# Patient Record
Sex: Female | Born: 1955 | Race: White | Hispanic: No | Smoking: Current some day smoker
Health system: Southern US, Community
[De-identification: ages and names within clinical notes are randomized; demographics above are authoritative.]

## PROBLEM LIST (undated history)

## (undated) DIAGNOSIS — Z5189 Encounter for other specified aftercare: Secondary | ICD-10-CM

## (undated) DIAGNOSIS — R011 Cardiac murmur, unspecified: Secondary | ICD-10-CM

## (undated) DIAGNOSIS — J449 Chronic obstructive pulmonary disease, unspecified: Secondary | ICD-10-CM

## (undated) DIAGNOSIS — N289 Disorder of kidney and ureter, unspecified: Secondary | ICD-10-CM

## (undated) DIAGNOSIS — C801 Malignant (primary) neoplasm, unspecified: Secondary | ICD-10-CM

## (undated) DIAGNOSIS — M1990 Unspecified osteoarthritis, unspecified site: Secondary | ICD-10-CM

## (undated) DIAGNOSIS — I509 Heart failure, unspecified: Secondary | ICD-10-CM

## (undated) DIAGNOSIS — D849 Immunodeficiency, unspecified: Secondary | ICD-10-CM

## (undated) DIAGNOSIS — J45909 Unspecified asthma, uncomplicated: Secondary | ICD-10-CM

## (undated) HISTORY — DX: Cardiac murmur, unspecified: R01.1

## (undated) HISTORY — PX: BLADDER REMOVAL: SHX567

---

## 2018-02-17 HISTORY — PX: FRACTURE SURGERY: SHX138

## 2018-12-25 ENCOUNTER — Ambulatory Visit: Payer: Self-pay

## 2019-02-26 ENCOUNTER — Other Ambulatory Visit: Payer: Self-pay | Admitting: Urology

## 2019-03-04 ENCOUNTER — Other Ambulatory Visit (HOSPITAL_COMMUNITY): Payer: Self-pay | Admitting: Urology

## 2019-03-04 DIAGNOSIS — N1339 Other hydronephrosis: Secondary | ICD-10-CM

## 2019-03-07 ENCOUNTER — Other Ambulatory Visit: Payer: Self-pay

## 2019-03-07 ENCOUNTER — Emergency Department (HOSPITAL_COMMUNITY)
Admission: EM | Admit: 2019-03-07 | Discharge: 2019-03-07 | Disposition: A | Discharge status: Home / Self Care | Payer: Medicare HMO | Attending: Emergency Medicine | Admitting: Emergency Medicine

## 2019-03-07 ENCOUNTER — Encounter (HOSPITAL_COMMUNITY): Payer: Self-pay

## 2019-03-07 ENCOUNTER — Telehealth: Payer: Self-pay | Admitting: Oncology

## 2019-03-07 ENCOUNTER — Emergency Department (HOSPITAL_COMMUNITY): Payer: Medicare HMO

## 2019-03-07 DIAGNOSIS — Y999 Unspecified external cause status: Secondary | ICD-10-CM | POA: Insufficient documentation

## 2019-03-07 DIAGNOSIS — Y92019 Unspecified place in single-family (private) house as the place of occurrence of the external cause: Secondary | ICD-10-CM | POA: Diagnosis not present

## 2019-03-07 DIAGNOSIS — R600 Localized edema: Secondary | ICD-10-CM | POA: Diagnosis not present

## 2019-03-07 DIAGNOSIS — J449 Chronic obstructive pulmonary disease, unspecified: Secondary | ICD-10-CM | POA: Diagnosis not present

## 2019-03-07 DIAGNOSIS — W1830XA Fall on same level, unspecified, initial encounter: Secondary | ICD-10-CM | POA: Insufficient documentation

## 2019-03-07 DIAGNOSIS — W19XXXA Unspecified fall, initial encounter: Secondary | ICD-10-CM

## 2019-03-07 DIAGNOSIS — Y9389 Activity, other specified: Secondary | ICD-10-CM | POA: Insufficient documentation

## 2019-03-07 DIAGNOSIS — S0003XA Contusion of scalp, initial encounter: Secondary | ICD-10-CM | POA: Insufficient documentation

## 2019-03-07 DIAGNOSIS — Z87891 Personal history of nicotine dependence: Secondary | ICD-10-CM | POA: Diagnosis not present

## 2019-03-07 DIAGNOSIS — S0990XA Unspecified injury of head, initial encounter: Secondary | ICD-10-CM | POA: Diagnosis not present

## 2019-03-07 DIAGNOSIS — N189 Chronic kidney disease, unspecified: Secondary | ICD-10-CM | POA: Insufficient documentation

## 2019-03-07 HISTORY — DX: Unspecified asthma, uncomplicated: J45.909

## 2019-03-07 HISTORY — DX: Malignant (primary) neoplasm, unspecified: C80.1

## 2019-03-07 HISTORY — DX: Heart failure, unspecified: I50.9

## 2019-03-07 HISTORY — DX: Unspecified osteoarthritis, unspecified site: M19.90

## 2019-03-07 HISTORY — DX: Immunodeficiency, unspecified: D84.9

## 2019-03-07 HISTORY — DX: Chronic obstructive pulmonary disease, unspecified: J44.9

## 2019-03-07 HISTORY — DX: Encounter for other specified aftercare: Z51.89

## 2019-03-07 HISTORY — DX: Disorder of kidney and ureter, unspecified: N28.9

## 2019-03-07 LAB — URINALYSIS, ROUTINE W REFLEX MICROSCOPIC
Bilirubin Urine: NEGATIVE
Glucose, UA: NEGATIVE mg/dL
Ketones, ur: NEGATIVE mg/dL
Nitrite: NEGATIVE
Protein, ur: 30 mg/dL — AB
Specific Gravity, Urine: 1.008 (ref 1.005–1.030)
pH: 6 (ref 5.0–8.0)

## 2019-03-07 LAB — CBC
HCT: 33.9 % — ABNORMAL LOW (ref 36.0–46.0)
Hemoglobin: 11 g/dL — ABNORMAL LOW (ref 12.0–15.0)
MCH: 29 pg (ref 26.0–34.0)
MCHC: 32.4 g/dL (ref 30.0–36.0)
MCV: 89.4 fL (ref 80.0–100.0)
Platelets: 413 10*3/uL — ABNORMAL HIGH (ref 150–400)
RBC: 3.79 MIL/uL — ABNORMAL LOW (ref 3.87–5.11)
RDW: 13.8 % (ref 11.5–15.5)
WBC: 6.5 10*3/uL (ref 4.0–10.5)
nRBC: 0 % (ref 0.0–0.2)

## 2019-03-07 LAB — BASIC METABOLIC PANEL
Anion gap: 13 (ref 5–15)
BUN: 20 mg/dL (ref 8–23)
CO2: 20 mmol/L — ABNORMAL LOW (ref 22–32)
Calcium: 9.1 mg/dL (ref 8.9–10.3)
Chloride: 102 mmol/L (ref 98–111)
Creatinine, Ser: 2.5 mg/dL — ABNORMAL HIGH (ref 0.44–1.00)
GFR calc Af Amer: 23 mL/min — ABNORMAL LOW (ref >60)
GFR calc non Af Amer: 20 mL/min — ABNORMAL LOW (ref >60)
Glucose, Bld: 112 mg/dL — ABNORMAL HIGH (ref 70–99)
Potassium: 3.6 mmol/L (ref 3.5–5.1)
Sodium: 135 mmol/L (ref 135–145)

## 2019-03-07 MED ORDER — SODIUM CHLORIDE 0.9% FLUSH: 3.0000 mL | Freq: Once | INTRAVENOUS | Status: DC

## 2019-03-07 NOTE — ED Provider Notes (Signed)
[facility] EMERGENCY DEPARTMENT Provider Note   CSN: [id number] Arrival date & time: 03/07/19  1621     History Chief Complaint  Patient presents with   Fall   Dizziness    Shelia Chan is a 64 y.o. female.  HPI Patient describes fall, and injuring the back of her head, after she stumbled on her slippers, at home today.  She states she has trouble walking because of swelling of her legs which is chronic and ongoing.  She feels the swelling is related to her chronic kidney disease.  She plans on seeing a nephrologist soon, and is scheduled to have "stents replaced," by her urologist next week.  She denies headache, neck pain, chest pain, back pain, injuries to her extremities.  There was no prodrome to the incident.  She denies recent acute illnesses.  She states she is taking her medications as directed.  There are no other known modifying factors.    Past Medical History:  Diagnosis Date   Arthritis    Asthma    Blood transfusion without reported diagnosis    Cancer ([place])    CHF (congestive heart failure) ([place])    COPD (chronic obstructive pulmonary disease) ([place])    Immune deficiency disorder ([facility])    Renal disorder     There are no problems to display for this patient.   Past Surgical History:  Procedure Laterality Date   BLADDER REMOVAL  118/22/2015   FRACTURE SURGERY Left 02/2018   pins     OB History   No obstetric history on file.     History reviewed. No pertinent family history.  Social History   Tobacco Use   Smoking status: Former Smoker    Packs/day: 1.00    Years: 50.00    Pack years: 50.00    Types: Cigarettes    Quit date: 01/21/2019    Years since quitting: 0.1   Smokeless tobacco: Never Used  Substance Use Topics   Alcohol use: Not Currently   Drug use: Yes    Types: Marijuana    Comment: occasional     Home Medications Prior to Admission medications   Not on File    Allergies    Patient has  no allergy information on record.  Review of Systems   Review of Systems  All other systems reviewed and are negative.   Physical Exam Updated Vital Signs BP 136/72    Pulse (!) 55    Temp 98.3 F (36.8 C) (Oral)    Resp 18    Ht 5' 1.5" (1.562 m)    Wt 59 kg    SpO2 99%    BMI 24.17 kg/m   Physical Exam Vitals and nursing note reviewed.  Constitutional:      General: She is not in acute distress.    Appearance: She is well-developed. She is not ill-appearing, toxic-appearing or diaphoretic.  HENT:     Head: Normocephalic.     Comments: Small contusion, left parietal.  No bleeding or laceration.    Mouth/Throat:     Mouth: Mucous membranes are moist.     Pharynx: No oropharyngeal exudate or posterior oropharyngeal erythema.  Eyes:     Conjunctiva/sclera: Conjunctivae normal.     Pupils: Pupils are equal, round, and reactive to light.  Neck:     Trachea: Phonation normal.  Cardiovascular:     Rate and Rhythm: Normal rate and regular rhythm.  Pulmonary:     Effort: Pulmonary effort is normal.  Breath sounds: Normal breath sounds.  Chest:     Chest wall: No tenderness.  Abdominal:     General: There is no distension.     Palpations: Abdomen is soft.     Tenderness: There is no abdominal tenderness. There is no guarding.  Genitourinary:    Comments: Urostomy, right lower quadrant, urine appears normal in the bag.  Device appears normal. Musculoskeletal:        General: Normal range of motion.     Cervical back: Normal range of motion and neck supple.     Right lower leg: Edema present.     Left lower leg: Edema present.  Skin:    General: Skin is warm and dry.  Neurological:     Mental Status: She is alert and oriented to person, place, and time.     Motor: No abnormal muscle tone.  Psychiatric:        Mood and Affect: Mood normal.        Behavior: Behavior normal.        Thought Content: Thought content normal.        Judgment: Judgment normal.     ED  Results / Procedures / Treatments   Labs (all labs ordered are listed, but only abnormal results are displayed) Labs Reviewed  BASIC METABOLIC PANEL - Abnormal; Notable for the following components:      Result Value   CO2 20 (*)    Glucose, Bld 112 (*)    Creatinine, Ser 2.50 (*)    GFR calc non Af Amer 20 (*)    GFR calc Af Amer 23 (*)    All other components within normal limits  CBC - Abnormal; Notable for the following components:   RBC 3.79 (*)    Hemoglobin 11.0 (*)    HCT 33.9 (*)    Platelets 413 (*)    All other components within normal limits  URINALYSIS, ROUTINE W REFLEX MICROSCOPIC - Abnormal; Notable for the following components:   APPearance CLOUDY (*)    Hgb urine dipstick MODERATE (*)    Protein, ur 30 (*)    Leukocytes,Ua LARGE (*)    Bacteria, UA FEW (*)    All other components within normal limits    EKG EKG Interpretation  Date/Time:  Thursday March 07 2019 16:33:29 EST Ventricular Rate:  60 PR Interval:  170 QRS Duration: 76 QT Interval:  432 QTC Calculation: 432 R Axis:   46 Text Interpretation: Normal sinus rhythm Nonspecific ST abnormality Abnormal ECG No previous ECGs available Confirmed by Daleen Bo [phone]) on 03/07/2019 6:05:31 PM   Radiology CT Head Wo Contrast  Result Date: 03/07/2019 CLINICAL DATA:  The patient fell and hit her head. Dizziness following the fall. EXAM: CT HEAD WITHOUT CONTRAST CT CERVICAL SPINE WITHOUT CONTRAST TECHNIQUE: Multidetector CT imaging of the head and cervical spine was performed following the standard protocol without intravenous contrast. Multiplanar CT image reconstructions of the cervical spine were also generated. COMPARISON:  None. FINDINGS: CT HEAD FINDINGS Brain: Normal appearing cerebral hemispheres and posterior fossa structures. Normal size and position of the ventricles. No intracranial hemorrhage, mass lesion or CT evidence of acute infarction. Vascular: No hyperdense vessel or unexpected  calcification. Skull: Normal. Negative for fracture or focal lesion. Sinuses/Orbits: Unremarkable. Other: None. CT CERVICAL SPINE FINDINGS Alignment: Mild retrolisthesis at the C5-6 level and minimal retrolisthesis at the C6-7 level. Skull base and vertebrae: No acute fracture. No primary bone lesion or focal pathologic process. Soft tissues and  spinal canal: No prevertebral fluid or swelling. No visible canal hematoma. Disc levels: Marked disc space narrowing with discogenic sclerosis, Schmorl's node formation mild anterior posterior spur formation at the C5-6 level. Similar changes to a slightly lesser degree at the C6-7 level. Mild facet degenerative changes at multiple levels. Upper chest: Mild bilateral upper lobe bullous changes. Other: 4 mm left lobe thyroid nodule and similar sized right lobe thyroid nodule. IMPRESSION: 1. No skull fracture or intracranial hemorrhage. 2. No cervical spine fracture or traumatic subluxation. 3. Multilevel cervical spine degenerative changes, as described above. 4. Mild changes of COPD. 5. Bilateral 4 mm thyroid nodules. No followup recommended (ref: J Am Coll Radiol. 2015 Feb;12(2): 143-50). Electronically Signed   By: Claudie Revering M.D.   On: 03/07/2019 19:07   CT Cervical Spine Wo Contrast  Result Date: 03/07/2019 CLINICAL DATA:  The patient fell and hit her head. Dizziness following the fall. EXAM: CT HEAD WITHOUT CONTRAST CT CERVICAL SPINE WITHOUT CONTRAST TECHNIQUE: Multidetector CT imaging of the head and cervical spine was performed following the standard protocol without intravenous contrast. Multiplanar CT image reconstructions of the cervical spine were also generated. COMPARISON:  None. FINDINGS: CT HEAD FINDINGS Brain: Normal appearing cerebral hemispheres and posterior fossa structures. Normal size and position of the ventricles. No intracranial hemorrhage, mass lesion or CT evidence of acute infarction. Vascular: No hyperdense vessel or unexpected  calcification. Skull: Normal. Negative for fracture or focal lesion. Sinuses/Orbits: Unremarkable. Other: None. CT CERVICAL SPINE FINDINGS Alignment: Mild retrolisthesis at the C5-6 level and minimal retrolisthesis at the C6-7 level. Skull base and vertebrae: No acute fracture. No primary bone lesion or focal pathologic process. Soft tissues and spinal canal: No prevertebral fluid or swelling. No visible canal hematoma. Disc levels: Marked disc space narrowing with discogenic sclerosis, Schmorl's node formation mild anterior posterior spur formation at the C5-6 level. Similar changes to a slightly lesser degree at the C6-7 level. Mild facet degenerative changes at multiple levels. Upper chest: Mild bilateral upper lobe bullous changes. Other: 4 mm left lobe thyroid nodule and similar sized right lobe thyroid nodule. IMPRESSION: 1. No skull fracture or intracranial hemorrhage. 2. No cervical spine fracture or traumatic subluxation. 3. Multilevel cervical spine degenerative changes, as described above. 4. Mild changes of COPD. 5. Bilateral 4 mm thyroid nodules. No followup recommended (ref: J Am Coll Radiol. 2015 Feb;12(2): 143-50). Electronically Signed   By: Claudie Revering M.D.   On: 03/07/2019 19:07    Procedures Procedures (including critical care time)  Medications Ordered in ED Medications  sodium chloride flush (NS) 0.9 % injection 3 mL (has no administration in time range)    ED Course  I have reviewed the triage vital signs and the nursing notes.  Pertinent labs & imaging results that were available during my care of the patient were reviewed by me and considered in my medical decision making (see chart for details).  Clinical Course as of Mar 06 1929  Thu Mar 07, 2019  1922 Normal except presence of hemoglobin, protein, leukocytes, WBC with white blood cell clumps, bacteria  Urinalysis, Routine w reflex microscopic(!) [EW]  1923 Normal exam CO2 low, glucose high, creatinine high, GFR low    Basic metabolic panel(!) [EW]  0865 Normal except hemoglobin low  CBC(!) [EW]  1926 CT images reviewed and interpreted by radiology, reassuring, no significant injury, or acute spine abnormality.   [EW]    Clinical Course User Index [EW] Daleen Bo, MD   MDM Rules/Calculators/A&P  Patient Vitals for the past 24 hrs:  BP Temp Temp src Pulse Resp SpO2 Height Weight  03/07/19 1830 136/72 -- -- -- 18 -- -- --  03/07/19 1815 -- -- -- -- -- 99 % -- --  03/07/19 1800 129/66 -- -- (!) 55 13 99 % -- --  03/07/19 1730 113/73 -- -- (!) 59 -- 99 % -- --  03/07/19 1626 113/60 98.3 F (36.8 C) Oral 67 16 97 % 5' 1.5" (1.562 m) 59 kg    7:24 PM Reevaluation with update and discussion. After initial assessment and treatment, an updated evaluation reveals no change in clinical status.  Findings discussed and questions answered. Daleen Bo   Medical Decision Making: Fall likely mechanical, after tripping in slippers.  Patient appears to be at her baseline with known chronic renal insufficiency, and urologic disorder.  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  History not definitive, because patient is a poor historian.  Patient is nontoxic.  Doubt serious bacterial infection, metabolic instability, or significant injury from fall.  No indication for further ED treatment, prescriptions at discharge, or hospitalization.  Meliyah Sons was evaluated in Emergency Department on 03/07/2019 for the symptoms described in the history of present illness. She was evaluated in the context of the global COVID-19 pandemic, which necessitated consideration that the patient might be at risk for infection with the SARS-CoV-2 virus that causes COVID-19. Institutional protocols and algorithms that pertain to the evaluation of patients at risk for COVID-19 are in a state of rapid change based on information released  by regulatory bodies including the CDC and federal and state organizations. These policies and algorithms were followed during the patient's care in the ED.    CRITICAL CARE-no Performed by: Daleen Bo   Nursing Notes Reviewed/ Care Coordinated Applicable Imaging Reviewed Interpretation of Laboratory Data incorporated into ED treatment  The patient appears reasonably screened and/or stabilized for discharge and I doubt any other medical condition or other [facility] requiring further screening, evaluation, or treatment in the ED at this time prior to discharge.  Plan: Home Medications- usual; Home Treatments-rest, fluids, usual diet; return here if the recommended treatment, does not improve the symptoms; Recommended follow up-PCP and urology as scheduled and needed.    Final Clinical Impression(s) / ED Diagnoses Final diagnoses:  Fall in home, initial encounter  Injury of head, initial encounter    Rx / DC Orders ED Discharge Orders    None       Daleen Bo, MD 03/07/19 [phone]

## 2019-03-07 NOTE — Telephone Encounter (Signed)
Received a new pt referral from Dr. 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 I cld and spoke to the pt's daughter to schedule an appt w/Dr. Alen Blew on 3/3 at 11am. Aware to arrive 15 minutes early.

## 2019-03-07 NOTE — Discharge Instructions (Addendum)
Follow-up with your primary care doctor as needed for problems.  Make sure that you see your urologist next week as scheduled.  Follow-up with your nephrologist for ongoing management of your chronic renal insufficiency.

## 2019-03-07 NOTE — ED Triage Notes (Signed)
Pt arrives pov after mechanical fall in which she hit her head. Denies LOC, denies blood thinners. States since the fall she has felt dizzy. No neuro deficits noted.

## 2019-03-07 NOTE — ED Notes (Signed)
Pt transported to CT ?

## 2019-03-11 ENCOUNTER — Encounter (HOSPITAL_COMMUNITY): Payer: Self-pay | Admitting: Emergency Medicine

## 2019-03-11 ENCOUNTER — Emergency Department (HOSPITAL_COMMUNITY)
Admission: EM | Admit: 2019-03-11 | Discharge: 2019-03-13 | Disposition: A | Discharge status: Other Acute Inpatient Hospital | Payer: Medicare HMO | Attending: Emergency Medicine | Admitting: Emergency Medicine

## 2019-03-11 ENCOUNTER — Other Ambulatory Visit: Payer: Self-pay

## 2019-03-11 DIAGNOSIS — J45909 Unspecified asthma, uncomplicated: Secondary | ICD-10-CM | POA: Insufficient documentation

## 2019-03-11 DIAGNOSIS — Z87891 Personal history of nicotine dependence: Secondary | ICD-10-CM | POA: Diagnosis not present

## 2019-03-11 DIAGNOSIS — J449 Chronic obstructive pulmonary disease, unspecified: Secondary | ICD-10-CM | POA: Diagnosis not present

## 2019-03-11 DIAGNOSIS — Z79899 Other long term (current) drug therapy: Secondary | ICD-10-CM | POA: Diagnosis not present

## 2019-03-11 DIAGNOSIS — F29 Unspecified psychosis not due to a substance or known physiological condition: Secondary | ICD-10-CM | POA: Insufficient documentation

## 2019-03-11 DIAGNOSIS — F209 Schizophrenia, unspecified: Secondary | ICD-10-CM | POA: Diagnosis not present

## 2019-03-11 DIAGNOSIS — Z20822 Contact with and (suspected) exposure to covid-19: Secondary | ICD-10-CM | POA: Insufficient documentation

## 2019-03-11 LAB — CBC
HCT: 32.8 % — ABNORMAL LOW (ref 36.0–46.0)
Hemoglobin: 10.6 g/dL — ABNORMAL LOW (ref 12.0–15.0)
MCH: 29.4 pg (ref 26.0–34.0)
MCHC: 32.3 g/dL (ref 30.0–36.0)
MCV: 90.9 fL (ref 80.0–100.0)
Platelets: 459 10*3/uL — ABNORMAL HIGH (ref 150–400)
RBC: 3.61 MIL/uL — ABNORMAL LOW (ref 3.87–5.11)
RDW: 13.9 % (ref 11.5–15.5)
WBC: 8.3 10*3/uL (ref 4.0–10.5)
nRBC: 0 % (ref 0.0–0.2)

## 2019-03-11 LAB — RAPID URINE DRUG SCREEN, HOSP PERFORMED
Amphetamines: NOT DETECTED
Barbiturates: NOT DETECTED
Benzodiazepines: NOT DETECTED
Cocaine: NOT DETECTED
Opiates: NOT DETECTED
Tetrahydrocannabinol: POSITIVE — AB

## 2019-03-11 LAB — COMPREHENSIVE METABOLIC PANEL
ALT: 18 U/L (ref 0–44)
AST: 24 U/L (ref 15–41)
Albumin: 3.1 g/dL — ABNORMAL LOW (ref 3.5–5.0)
Alkaline Phosphatase: 93 U/L (ref 38–126)
Anion gap: 12 (ref 5–15)
BUN: 30 mg/dL — ABNORMAL HIGH (ref 8–23)
CO2: 19 mmol/L — ABNORMAL LOW (ref 22–32)
Calcium: 9 mg/dL (ref 8.9–10.3)
Chloride: 105 mmol/L (ref 98–111)
Creatinine, Ser: 2.61 mg/dL — ABNORMAL HIGH (ref 0.44–1.00)
GFR calc Af Amer: 22 mL/min — ABNORMAL LOW (ref >60)
GFR calc non Af Amer: 19 mL/min — ABNORMAL LOW (ref >60)
Glucose, Bld: 102 mg/dL — ABNORMAL HIGH (ref 70–99)
Potassium: 3.5 mmol/L (ref 3.5–5.1)
Sodium: 136 mmol/L (ref 135–145)
Total Bilirubin: 0.5 mg/dL (ref 0.3–1.2)
Total Protein: 6.5 g/dL (ref 6.5–8.1)

## 2019-03-11 LAB — RESPIRATORY PANEL BY RT PCR (FLU A&B, COVID)
Influenza A by PCR: NEGATIVE
Influenza B by PCR: NEGATIVE
SARS Coronavirus 2 by RT PCR: NEGATIVE

## 2019-03-11 LAB — ETHANOL: Alcohol, Ethyl (B): <10 mg/dL (ref <10)

## 2019-03-11 MED ORDER — TRAZODONE HCL 50 MG PO TABS
50.0000 mg | ORAL_TABLET | Freq: Every day | ORAL | Status: DC
  Administered 2019-03-11 – 2019-03-12 (×2): 50 mg via ORAL
  Filled 2019-03-11 (×2): qty 1

## 2019-03-11 MED ORDER — ATORVASTATIN CALCIUM 10 MG PO TABS
10.0000 mg | ORAL_TABLET | Freq: Every day | ORAL | Status: DC
  Administered 2019-03-11 – 2019-03-12 (×2): 10 mg via ORAL
  Filled 2019-03-11 (×2): qty 1

## 2019-03-11 MED ORDER — ACETAMINOPHEN 325 MG PO TABS
650.0000 mg | ORAL_TABLET | ORAL | Status: DC | PRN
  Administered 2019-03-12 – 2019-03-13 (×3): 650 mg via ORAL
  Filled 2019-03-11 (×3): qty 2

## 2019-03-11 MED ORDER — ARIPIPRAZOLE 10 MG PO TABS
20.0000 mg | ORAL_TABLET | Freq: Every day | ORAL | Status: DC
  Administered 2019-03-12 – 2019-03-13 (×2): 20 mg via ORAL
  Filled 2019-03-11 (×2): qty 2

## 2019-03-11 MED ORDER — HYDROXYZINE HCL 50 MG PO TABS
50.0000 mg | ORAL_TABLET | Freq: Every day | ORAL | Status: DC
  Administered 2019-03-11 – 2019-03-12 (×2): 50 mg via ORAL
  Filled 2019-03-11 (×2): qty 1

## 2019-03-11 MED ORDER — PANTOPRAZOLE SODIUM 40 MG PO TBEC
40.0000 mg | DELAYED_RELEASE_TABLET | Freq: Every day | ORAL | Status: DC
  Administered 2019-03-12 – 2019-03-13 (×2): 40 mg via ORAL
  Filled 2019-03-11 (×2): qty 1

## 2019-03-11 MED ORDER — CALCITRIOL 0.25 MCG PO CAPS
0.2500 ug | ORAL_CAPSULE | Freq: Every day | ORAL | Status: DC
  Administered 2019-03-12 – 2019-03-13 (×2): 0.25 ug via ORAL
  Filled 2019-03-11 (×3): qty 1

## 2019-03-11 MED ORDER — METOPROLOL SUCCINATE ER 25 MG PO TB24
25.0000 mg | ORAL_TABLET | Freq: Every day | ORAL | Status: DC
  Administered 2019-03-12 – 2019-03-13 (×2): 25 mg via ORAL
  Filled 2019-03-11 (×2): qty 1

## 2019-03-11 NOTE — ED Provider Notes (Signed)
[facility] EMERGENCY DEPARTMENT Provider Note   CSN: [id number] Arrival date & time: 03/11/19  1740     History Chief Complaint  Patient presents with  . Paranoid    Shelia Chan is a 64 y.o. female.  She recently moved here from [place] and is living with her family members.  She has a history of bipolar and schizophrenia and takes medication for that.  She said she has been taking her medication as prescribed.  For about 2 weeks she is been paranoid that her family members are going to kill her.  She was so scared today that she left the house with her dog and ultimately was found near her or bridged on the highway.  She denies that she was going to hurt herself.  She said she has had psychiatric hospitalizations in the past.  Denies any medical complaints.  The history is provided by the patient.  Mental Health Problem Presenting symptoms: delusional and paranoid behavior   Degree of incapacity (severity):  Moderate Onset quality:  Gradual Timing:  Intermittent Progression:  Worsening Chronicity:  New Context: not alcohol use   Treatment compliance:  Untreated Relieved by:  Nothing Worsened by:  Nothing Ineffective treatments:  None tried Associated symptoms: anxiety   Associated symptoms: no abdominal pain, no appetite change, no chest pain and no headaches   Risk factors: hx of mental illness        Past Medical History:  Diagnosis Date  . Arthritis   . Asthma   . Blood transfusion without reported diagnosis   . Cancer ([place])   . CHF (congestive heart failure) ([place])   . COPD (chronic obstructive pulmonary disease) ([place])   . Immune deficiency disorder ([place])   . Renal disorder     There are no problems to display for this patient.   Past Surgical History:  Procedure Laterality Date  . BLADDER REMOVAL  118/22/2015  . FRACTURE SURGERY Left 02/2018   pins     OB History   No obstetric history on file.     No family history on  file.  Social History   Tobacco Use  . Smoking status: Former Smoker    Packs/day: 1.00    Years: 50.00    Pack years: 50.00    Types: Cigarettes    Quit date: 01/21/2019    Years since quitting: 0.1  . Smokeless tobacco: Never Used  Substance Use Topics  . Alcohol use: Not Currently  . Drug use: Yes    Types: Marijuana    Comment: occasional     Home Medications Prior to Admission medications   Not on File    Allergies    Patient has no known allergies.  Review of Systems   Review of Systems  Constitutional: Negative for appetite change and fever.  HENT: Negative for sore throat.   Eyes: Negative for visual disturbance.  Respiratory: Negative for shortness of breath.   Cardiovascular: Negative for chest pain.  Gastrointestinal: Positive for nausea. Negative for abdominal pain.  Genitourinary: Negative for dysuria.  Musculoskeletal: Negative for back pain.  Skin: Negative for rash.  Neurological: Negative for headaches.  Psychiatric/Behavioral: Positive for paranoia. The patient is nervous/anxious.     Physical Exam Updated Vital Signs BP 113/64 (BP Location: Right Arm)   Pulse 65   Temp 98.2 F (36.8 C) (Oral)   Resp 14   Ht 5' 1.5" (1.562 m)   Wt 57.6 kg   SpO2 100%   BMI  23.61 kg/m   Physical Exam Vitals and nursing note reviewed.  Constitutional:      General: She is not in acute distress.    Appearance: She is well-developed.  HENT:     Head: Normocephalic and atraumatic.  Eyes:     Conjunctiva/sclera: Conjunctivae normal.  Cardiovascular:     Rate and Rhythm: Normal rate and regular rhythm.     Heart sounds: No murmur.  Pulmonary:     Effort: Pulmonary effort is normal. No respiratory distress.     Breath sounds: Normal breath sounds.  Abdominal:     Palpations: Abdomen is soft.     Tenderness: There is no abdominal tenderness.     Comments: Urostomy tube  Musculoskeletal:     Cervical back: Neck supple.     Right lower leg: Edema  present.     Left lower leg: Edema present.  Skin:    General: Skin is warm and dry.     Capillary Refill: Capillary refill takes less than 2 seconds.  Neurological:     General: No focal deficit present.     Mental Status: She is alert.  Psychiatric:        Thought Content: Thought content is paranoid.     ED Results / Procedures / Treatments   Labs (all labs ordered are listed, but only abnormal results are displayed) Labs Reviewed  COMPREHENSIVE METABOLIC PANEL - Abnormal; Notable for the following components:      Result Value   CO2 19 (*)    Glucose, Bld 102 (*)    BUN 30 (*)    Creatinine, Ser 2.61 (*)    Albumin 3.1 (*)    GFR calc non Af Amer 19 (*)    GFR calc Af Amer 22 (*)    All other components within normal limits  CBC - Abnormal; Notable for the following components:   RBC 3.61 (*)    Hemoglobin 10.6 (*)    HCT 32.8 (*)    Platelets 459 (*)    All other components within normal limits  RAPID URINE DRUG SCREEN, HOSP PERFORMED - Abnormal; Notable for the following components:   Tetrahydrocannabinol POSITIVE (*)    All other components within normal limits  ETHANOL    EKG EKG Interpretation  Date/Time:  Monday March 11 2019 20:51:57 EST Ventricular Rate:  61 PR Interval:    QRS Duration: 91 QT Interval:  386 QTC Calculation: 389 R Axis:   45 Text Interpretation: Sinus rhythm Artifact in lead(s) II III aVR aVL aVF V1 V2 V3 V4 V6 Poor data quality Confirmed by Aletta Edouard [phone]) on 03/11/2019 9:00:24 PM   Radiology No results found.  Procedures Procedures (including critical care time)  Medications Ordered in ED Medications  acetaminophen (TYLENOL) tablet 650 mg (has no administration in time range)  ARIPiprazole (ABILIFY) tablet 20 mg (has no administration in time range)  atorvastatin (LIPITOR) tablet 10 mg (10 mg Oral Given 03/11/19 2242)  calcitRIOL (ROCALTROL) capsule 0.25 mcg (has no administration in time range)  hydrOXYzine  (ATARAX/VISTARIL) tablet 50 mg (50 mg Oral Given 03/11/19 2242)  metoprolol succinate (TOPROL-XL) 24 hr tablet 25 mg (has no administration in time range)  pantoprazole (PROTONIX) EC tablet 40 mg (has no administration in time range)  traZODone (DESYREL) tablet 50 mg (50 mg Oral Given 03/11/19 2242)    ED Course  I have reviewed the triage vital signs and the nursing notes.  Pertinent labs & imaging results that were available during  my care of the patient were reviewed by me and considered in my medical decision making (see chart for details).    MDM Rules/Calculators/A&P                     The patient has been placed in psychiatric observation due to the need to provide a safe environment for the patient while obtaining psychiatric consultation and evaluation, as well as ongoing medical and medication management to treat the patient's condition.  The patient has not been placed under full IVC at this time.  Final Clinical Impression(s) / ED Diagnoses Final diagnoses:  Psychosis, unspecified psychosis type [facility])    Rx / DC Orders ED Discharge Orders    None       Hayden Rasmussen, MD 03/12/19 [phone]

## 2019-03-11 NOTE — ED Notes (Signed)
Pt's belongings in locker 6

## 2019-03-11 NOTE — ED Notes (Signed)
Granddaughter- summer fomby called. She was going over things that the pt had been doing like walking down highways, and hearing voices. Her number is [phone] for updates and if anyone has any questions

## 2019-03-11 NOTE — ED Triage Notes (Signed)
Pt reports she is having paranoid thoughts and hallucinations. Denies SI/HI, reports she has been taking her medications as prescribed.

## 2019-03-12 ENCOUNTER — Ambulatory Visit (HOSPITAL_BASED_OUTPATIENT_CLINIC_OR_DEPARTMENT_OTHER): Admission: RE | Admit: 2019-03-12 | Payer: Medicare HMO | Source: Home / Self Care | Admitting: Urology

## 2019-03-12 ENCOUNTER — Encounter (HOSPITAL_BASED_OUTPATIENT_CLINIC_OR_DEPARTMENT_OTHER): Admission: RE | Payer: Self-pay | Source: Home / Self Care

## 2019-03-12 SURGERY — CYSTOSCOPY, FLEXIBLE, WITH STENT REPLACEMENT
Anesthesia: General | Laterality: Bilateral
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
## 2019-03-12 NOTE — ED Notes (Signed)
Grandaughter Shelia Chan has called for an update. She has requested that after re-eval with TTS to get another update. She then asked to speak with her on the phone. Pt is pleasant and in good spirits at this time.
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
## 2019-03-12 NOTE — ED Notes (Signed)
Dinner tray arrived 

## 2019-03-12 NOTE — BHH Counselor (Signed)
Received notification from [facility] ED that patient was feeling better and would like to be discharged home. Discussed with Earleen Newport, NP who reviewed patient's chart. She recommends patient be observed for one more night in the ED and seen by a provider on 2/24 AM.

## 2019-03-12 NOTE — ED Notes (Signed)
Lunch Tray Ordered @ 1021. 

## 2019-03-12 NOTE — ED Notes (Signed)
Pt reported some diarrhea and that her "hiney hole" is getting chapped from the diarrhea. Pt provided with barrier cream and some mesh underwear. Pt independently applied the cream.

## 2019-03-12 NOTE — ED Notes (Signed)
RN talked to granddaughter, Summer, with pt's permission. They are moving her to a new place that is less gloomy. They are also getting her a new pill dispenser to help her keep organized. She also states that they are going to go with her to all drs appts to keep informed about changes in her meds and make sure she is telling truth. Pt then spoke to granddaughter.

## 2019-03-12 NOTE — ED Notes (Signed)
TTS in process 

## 2019-03-12 NOTE — ED Notes (Signed)
Dinner tray ordered.

## 2019-03-12 NOTE — ED Notes (Signed)
Patient denies pain and is resting comfortably.  

## 2019-03-12 NOTE — ED Notes (Signed)
Pt came out of room with medicine cup withsmall scaly pieces of skin. She gave it to RN stating she has these little lesions growing on her and she scratches them off. Asked if RN can have it analyzed to see what it is. Marland Kitchen

## 2019-03-12 NOTE — ED Notes (Signed)
BH has called; says there needs to be at least 24 hours to reassess. So she will be reassessed in the morning; BH does recommend treating for possible UTI as root cause of mental status change.

## 2019-03-12 NOTE — Progress Notes (Signed)
Patient meets inpatient criteria per Anette Riedel, NP. Patient has been faxed out to the following facilities for review:   [facility]  CSW will continue to follow and assist with finding bed placement.   Domenic Schwab, MSW, LCSW-A Clinical Disposition Social Worker [facility]/TTS [phone]

## 2019-03-12 NOTE — ED Notes (Signed)
Pt up to restroom.

## 2019-03-12 NOTE — ED Notes (Signed)
Lunch has arrived ?

## 2019-03-12 NOTE — ED Notes (Signed)
Inpatient- TTS working on placement Breakfast ordered

## 2019-03-13 NOTE — ED Notes (Signed)
Pt has showered and fresh sheets and fresh scrubs given.

## 2019-03-13 NOTE — ED Notes (Signed)
Pt out at the nurses station speaking with staff. Pt yells out "fuck you to the patient in the other room that is also speaking". Pt called her by a different name though. Asked pt to please refrain from yelling and using language like that again.

## 2019-03-13 NOTE — Progress Notes (Addendum)
Pt accepted to [facility]     Dr. Melonie Florida is the accepting/attending provider.    Call report to [place] @ [facility] ED notified.     Pt is voluntary and will be transported by [employer]  Pt may arrive as soon as transportation is arranged.   Pt's granddaughter, Summer, notified.   Audree Camel, LCSW, [place] Disposition [facility]/TTS [phone]

## 2019-03-13 NOTE — ED Notes (Signed)
Vol/Re-eval 2/24 by [facility] for dc  Breakfast ordered

## 2019-03-13 NOTE — Progress Notes (Signed)
[facility] admissions staff contacted CSW and declined pt admission due to her urostomy. It was relayed that this decision was made by their accepting provider. Elizabeth @ [facility] notified.   Audree Camel, LCSW, [place] Disposition [place] [facility]/TTS [phone]

## 2019-03-13 NOTE — ED Notes (Signed)
Lunch Tray Ordered @ 1049. °

## 2019-03-13 NOTE — ED Notes (Signed)
Pt speaking with granddaughter on phone at nurses station. Pt talking about tuning in to a station, like a radio station,  so she can hear what people are saying about her and that her granddaughter should also "tune in". Pt cursing at times. Pt making wild statements.
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
## 2019-03-13 NOTE — ED Notes (Addendum)
Pt continues to talk to herself or someone in room as she has done all night. Unsure if she is answering herself, but she seems to be carrying on a conversation with someone. Pt speaking very loud. Pt out to nurses station. States that she had seven fingers on each hand but they broke them and then she had them remove them. She also states that she had 3 arms, but they removed one of them also and that she missed the 3rd arm because it was such a big help with holding things.

## 2019-03-13 NOTE — ED Notes (Signed)
RN was on the phone and was giving report to [facility]. She was informed that pt was declined due to urostomy. RN let [facility] know. Summer called and notified. Pt updated as well.

## 2019-03-13 NOTE — ED Notes (Signed)
Pt requested her uristomy supplies to change her bag. Supplies given.

## 2019-03-13 NOTE — Progress Notes (Signed)
Referral information has also been sent to the following hospitals:  [facility]  Disposition will continue to follow.   Audree Camel, LCSW, [place] Disposition [facility] [facility]/TTS [phone]

## 2019-03-13 NOTE — Progress Notes (Signed)
Pt accepted to [facility] main [facility]   Dr. Selinda Flavin is the accepting/attending provider.    Call report to [place] @ [facility] ED notified.     Pt is voluntary and will be transported by [employer] may arrive at [facility] as soon as transportation is arranged.   Audree Camel, LCSW, [place] Disposition [facility]/TTS [phone]

## 2019-03-20 ENCOUNTER — Inpatient Hospital Stay: Payer: Medicare HMO | Attending: Oncology | Admitting: Oncology

## 2019-04-08 ENCOUNTER — Inpatient Hospital Stay (HOSPITAL_COMMUNITY): Payer: Medicare HMO

## 2019-04-08 ENCOUNTER — Other Ambulatory Visit (HOSPITAL_COMMUNITY): Payer: Self-pay | Admitting: Urology

## 2019-04-08 ENCOUNTER — Encounter (HOSPITAL_COMMUNITY): Payer: Self-pay | Admitting: Emergency Medicine

## 2019-04-08 ENCOUNTER — Inpatient Hospital Stay (HOSPITAL_COMMUNITY)
Admission: EM | Admit: 2019-04-08 | Discharge: 2019-04-10 | DRG: 389 | Disposition: A | Discharge status: Home / Self Care | Payer: Medicare HMO | Attending: Family Medicine | Admitting: Family Medicine

## 2019-04-08 ENCOUNTER — Emergency Department (HOSPITAL_COMMUNITY): Payer: Medicare HMO

## 2019-04-08 DIAGNOSIS — Z20822 Contact with and (suspected) exposure to covid-19: Secondary | ICD-10-CM | POA: Diagnosis present

## 2019-04-08 DIAGNOSIS — Z906 Acquired absence of other parts of urinary tract: Secondary | ICD-10-CM

## 2019-04-08 DIAGNOSIS — Z8551 Personal history of malignant neoplasm of bladder: Secondary | ICD-10-CM

## 2019-04-08 DIAGNOSIS — K5651 Intestinal adhesions [bands], with partial obstruction: Secondary | ICD-10-CM | POA: Diagnosis not present

## 2019-04-08 DIAGNOSIS — Z23 Encounter for immunization: Secondary | ICD-10-CM

## 2019-04-08 DIAGNOSIS — F22 Delusional disorders: Secondary | ICD-10-CM | POA: Diagnosis present

## 2019-04-08 DIAGNOSIS — K56609 Unspecified intestinal obstruction, unspecified as to partial versus complete obstruction: Secondary | ICD-10-CM | POA: Diagnosis not present

## 2019-04-08 DIAGNOSIS — I509 Heart failure, unspecified: Secondary | ICD-10-CM | POA: Diagnosis present

## 2019-04-08 DIAGNOSIS — N1339 Other hydronephrosis: Secondary | ICD-10-CM

## 2019-04-08 DIAGNOSIS — Z87891 Personal history of nicotine dependence: Secondary | ICD-10-CM

## 2019-04-08 DIAGNOSIS — N184 Chronic kidney disease, stage 4 (severe): Secondary | ICD-10-CM | POA: Diagnosis present

## 2019-04-08 DIAGNOSIS — Z0189 Encounter for other specified special examinations: Secondary | ICD-10-CM

## 2019-04-08 DIAGNOSIS — J449 Chronic obstructive pulmonary disease, unspecified: Secondary | ICD-10-CM | POA: Diagnosis not present

## 2019-04-08 LAB — URINALYSIS, ROUTINE W REFLEX MICROSCOPIC
Bilirubin Urine: NEGATIVE
Glucose, UA: NEGATIVE mg/dL
Ketones, ur: NEGATIVE mg/dL
Nitrite: NEGATIVE
Protein, ur: >=300 mg/dL — AB
Specific Gravity, Urine: 1.012 (ref 1.005–1.030)
WBC, UA: >50 WBC/hpf — ABNORMAL HIGH (ref 0–5)
pH: 6 (ref 5.0–8.0)

## 2019-04-08 LAB — CBC
HCT: 36.6 % (ref 36.0–46.0)
Hemoglobin: 11.8 g/dL — ABNORMAL LOW (ref 12.0–15.0)
MCH: 28.1 pg (ref 26.0–34.0)
MCHC: 32.2 g/dL (ref 30.0–36.0)
MCV: 87.1 fL (ref 80.0–100.0)
Platelets: 615 10*3/uL — ABNORMAL HIGH (ref 150–400)
RBC: 4.2 MIL/uL (ref 3.87–5.11)
RDW: 14.3 % (ref 11.5–15.5)
WBC: 9.7 10*3/uL (ref 4.0–10.5)
nRBC: 0 % (ref 0.0–0.2)

## 2019-04-08 LAB — COMPREHENSIVE METABOLIC PANEL
ALT: 13 U/L (ref 0–44)
AST: 18 U/L (ref 15–41)
Albumin: 3.7 g/dL (ref 3.5–5.0)
Alkaline Phosphatase: 92 U/L (ref 38–126)
Anion gap: 16 — ABNORMAL HIGH (ref 5–15)
BUN: 25 mg/dL — ABNORMAL HIGH (ref 8–23)
CO2: 20 mmol/L — ABNORMAL LOW (ref 22–32)
Calcium: 9.8 mg/dL (ref 8.9–10.3)
Chloride: 101 mmol/L (ref 98–111)
Creatinine, Ser: 2.35 mg/dL — ABNORMAL HIGH (ref 0.44–1.00)
GFR calc Af Amer: 24 mL/min — ABNORMAL LOW (ref >60)
GFR calc non Af Amer: 21 mL/min — ABNORMAL LOW (ref >60)
Glucose, Bld: 149 mg/dL — ABNORMAL HIGH (ref 70–99)
Potassium: 3.8 mmol/L (ref 3.5–5.1)
Sodium: 137 mmol/L (ref 135–145)
Total Bilirubin: 0.9 mg/dL (ref 0.3–1.2)
Total Protein: 8.1 g/dL (ref 6.5–8.1)

## 2019-04-08 LAB — RESPIRATORY PANEL BY RT PCR (FLU A&B, COVID)
Influenza A by PCR: NEGATIVE
Influenza B by PCR: NEGATIVE
SARS Coronavirus 2 by RT PCR: NEGATIVE

## 2019-04-08 LAB — LIPASE, BLOOD: Lipase: 28 U/L (ref 11–51)

## 2019-04-08 MED ORDER — FAMOTIDINE IN NACL 20-0.9 MG/50ML-% IV SOLN
20.0000 mg | Freq: Every day | INTRAVENOUS | Status: DC
  Administered 2019-04-09 – 2019-04-10 (×3): 20 mg via INTRAVENOUS
  Filled 2019-04-08 (×4): qty 50

## 2019-04-08 MED ORDER — DIATRIZOATE MEGLUMINE & SODIUM 66-10 % PO SOLN
90.0000 mL | Freq: Once | ORAL | Status: AC
  Administered 2019-04-09: 01:00:00 90 mL via NASOGASTRIC
  Filled 2019-04-08: qty 90

## 2019-04-08 MED ORDER — ALBUTEROL SULFATE (2.5 MG/3ML) 0.083% IN NEBU: 3.0000 mL | INHALATION_SOLUTION | Freq: Four times a day (QID) | RESPIRATORY_TRACT | Status: DC | PRN

## 2019-04-08 MED ORDER — SODIUM CHLORIDE 0.9% FLUSH: 3.0000 mL | Freq: Once | INTRAVENOUS | Status: DC

## 2019-04-08 MED ORDER — ONDANSETRON HCL 4 MG/2ML IJ SOLN
4.0000 mg | Freq: Four times a day (QID) | INTRAMUSCULAR | Status: DC | PRN
  Administered 2019-04-09 (×3): 4 mg via INTRAVENOUS
  Filled 2019-04-08 (×3): qty 2

## 2019-04-08 MED ORDER — FENTANYL CITRATE (PF) 100 MCG/2ML IJ SOLN
25.0000 ug | INTRAMUSCULAR | Status: DC | PRN
  Administered 2019-04-09: 01:00:00 25 ug via INTRAVENOUS
  Administered 2019-04-09 (×2): 50 ug via INTRAVENOUS
  Filled 2019-04-08 (×3): qty 2

## 2019-04-08 MED ORDER — SODIUM CHLORIDE 0.9 % IV SOLN: INTRAVENOUS | Status: AC

## 2019-04-08 NOTE — Consult Note (Signed)
Reason for Consult:SBO Referring Physician: [name] is an 64 y.o. female.  HPI: 64 year old female with a history of a cystectomy for bladder cancer with placement of a urostomy in the past presents with nausea and vomiting.  She has a history of prior small bowel obstruction after her cystectomy and urostomy which required a small bowel resection but that was many years ago she claims.  This time, at home, she had nausea and vomiting for about 2 days and was hoping the obstruction would resolve on its own.  It did not so she came to the emergency department.  She often pures her food to make it easier to digest.  She has a history of CHF and CKD with bilateral ureteral stents.  She also has a history of some psychiatric issues which required psychiatric hospitalization last month.  She says she is feeling better from that aspect.  Past Medical History:  Diagnosis Date  . Arthritis   . Asthma   . Blood transfusion without reported diagnosis   . Cancer ([place])   . CHF (congestive heart failure) ([place])   . COPD (chronic obstructive pulmonary disease) ([place])   . Immune deficiency disorder ([place])   . Renal disorder     Past Surgical History:  Procedure Laterality Date  . BLADDER REMOVAL  118/22/2015  . FRACTURE SURGERY Left 02/2018   pins    No family history on file.  Social History:  reports that she quit smoking about 2 months ago. Her smoking use included cigarettes. She has a 50.00 pack-year smoking history. She has never used smokeless tobacco. She reports previous alcohol use. She reports current drug use. Drug: Marijuana.  Allergies: No Known Allergies  Medications: I have reviewed the patient's current medications.  Results for orders placed or performed during the hospital encounter of 04/08/19 (from the past 48 hour(s))  Lipase, blood     Status: None   Collection Time: 04/08/19  4:07 PM  Result Value Ref Range   Lipase 28 11 - 51 U/L    Comment: Performed at  [facility], [place] [address] [place], [place] [address]  Comprehensive metabolic panel     Status: Abnormal   Collection Time: 04/08/19  4:07 PM  Result Value Ref Range   Sodium 137 135 - 145 mmol/L   Potassium 3.8 3.5 - 5.1 mmol/L   Chloride 101 98 - 111 mmol/L   CO2 20 (L) 22 - 32 mmol/L   Glucose, Bld 149 (H) 70 - 99 mg/dL    Comment: Glucose reference range applies only to samples taken after fasting for at least 8 hours.   BUN 25 (H) 8 - 23 mg/dL   Creatinine, Ser 2.35 (H) 0.44 - 1.00 mg/dL   Calcium 9.8 8.9 - 10.3 mg/dL   Total Protein 8.1 6.5 - 8.1 g/dL   Albumin 3.7 3.5 - 5.0 g/dL   AST 18 15 - 41 U/L   ALT 13 0 - 44 U/L   Alkaline Phosphatase 92 38 - 126 U/L   Total Bilirubin 0.9 0.3 - 1.2 mg/dL   GFR calc non Af Amer 21 (L) >60 mL/min   GFR calc Af Amer 24 (L) >60 mL/min   Anion gap 16 (H) 5 - 15    Comment: Performed at [facility] [address] [place], [place] [address]  CBC     Status: Abnormal   Collection Time: 04/08/19  4:07 PM  Result Value Ref Range  WBC 9.7 4.0 - 10.5 K/uL   RBC 4.20 3.87 - 5.11 MIL/uL   Hemoglobin 11.8 (L) 12.0 - 15.0 g/dL   HCT 36.6 36.0 - 46.0 %   MCV 87.1 80.0 - 100.0 fL   MCH 28.1 26.0 - 34.0 pg   MCHC 32.2 30.0 - 36.0 g/dL   RDW 14.3 11.5 - 15.5 %   Platelets 615 (H) 150 - 400 K/uL   nRBC 0.0 0.0 - 0.2 %    Comment: Performed at [facility] [address], [place], [place] [address]  Urinalysis, Routine w reflex microscopic     Status: Abnormal   Collection Time: 04/08/19  9:23 PM  Result Value Ref Range   Color, Urine YELLOW YELLOW   APPearance CLOUDY (A) CLEAR   Specific Gravity, Urine 1.012 1.005 - 1.030   pH 6.0 5.0 - 8.0   Glucose, UA NEGATIVE NEGATIVE mg/dL   Hgb urine dipstick SMALL (A) NEGATIVE   Bilirubin Urine NEGATIVE NEGATIVE   Ketones, ur NEGATIVE NEGATIVE mg/dL   Protein, ur >=300 (A) NEGATIVE mg/dL   Nitrite NEGATIVE NEGATIVE   Leukocytes,Ua LARGE (A) NEGATIVE   RBC / HPF 11-20 0 - 5  RBC/hpf   WBC, UA >50 (H) 0 - 5 WBC/hpf   Bacteria, UA MANY (A) NONE SEEN   Squamous Epithelial / LPF 0-5 0 - 5   WBC Clumps PRESENT    Mucus PRESENT     Comment: Performed at [facility], [place] [address], [place], [place] [address]    CT Abdomen Pelvis Wo Contrast  Result Date: 04/08/2019 CLINICAL DATA:  Abdominal pain EXAM: CT ABDOMEN AND PELVIS WITHOUT CONTRAST TECHNIQUE: Multidetector CT imaging of the abdomen and pelvis was performed following the standard protocol without IV contrast. COMPARISON:  None. FINDINGS: Lower chest: Lung bases are clear. No effusions. Heart is normal size. Hepatobiliary: No focal hepatic abnormality. Gallbladder unremarkable. Pancreas: No focal abnormality or ductal dilatation. Spleen: No focal abnormality.  Normal size. 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 Left kidney is atrophic with cortical thinning. Left hydronephrosis. Ureteral stents are in place. The proximal aspect of the left ureteral stent is in the proximal to mid left ureter. These extend into the urostomy bag. No adrenal mass. Stomach/Bowel: There are dilated small bowel loops in the abdomen and pelvis. Postoperative changes within small bowel loops in the pelvis. Exact transition is not visualized, but appearance is compatible with small bowel obstruction. Small bowel loops in the pelvis near the operative level are dilated and fecalized. Vascular/Lymphatic: Aortic atherosclerosis. No enlarged abdominal or pelvic lymph nodes. Reproductive: Prior hysterectomy.  No adnexal masses. Other: No free fluid or free air. Musculoskeletal: No acute bony abnormality. IMPRESSION: Prior cystectomy. Ileal conduit with right lower quadrant urostomy. There is left hydronephrosis with left renal cortical thinning and left renal atrophy suggesting a longstanding process. Ureteral stents are in place. The left ureteral stent proximal aspect is in the mid left ureter. Dilated small bowel  loops into the pelvis where there are fecalized small bowel loops near the surgical suture line. Findings compatible with small bowel obstruction. Exact transition not visualized. Aortic atherosclerosis. Electronically Signed   By: Rolm Baptise M.D.   On: 04/08/2019 21:40    Review of Systems  Constitutional: Negative.   HENT: Negative.   Eyes: Negative.   Respiratory: Negative.   Cardiovascular:       Chronic leg swelling is better recently  Gastrointestinal: Positive for abdominal pain,  nausea and vomiting.  Endocrine: Negative.   Genitourinary:       Urostomy with stents  Musculoskeletal: Negative.   Allergic/Immunologic: Negative.   Neurological: Negative.   Hematological: Negative.   Psychiatric/Behavioral: Positive for dysphoric mood.   Blood pressure 140/73, pulse (!) 57, temperature (!) 97.5 F (36.4 C), temperature source Oral, resp. rate 19, height 5' 1.5" (1.562 m), weight 57.6 kg, SpO2 97 %. Physical Exam  Constitutional: She is oriented to person, place, and time. She appears well-developed and well-nourished. No distress.  HENT:  Head: Normocephalic.  Right Ear: External ear normal.  Left Ear: External ear normal.  Eyes: Pupils are equal, round, and reactive to light. EOM are normal. Right eye exhibits no discharge. Left eye exhibits no discharge. No scleral icterus.  Wears glasses  Neck: No thyromegaly present.  Cardiovascular: Normal rate, regular rhythm and normal heart sounds.  Moderate peripheral edema  Respiratory: Effort normal and breath sounds normal. No respiratory distress. She has no wheezes. She has no rales.  GI: Soft. She exhibits no distension. There is no abdominal tenderness. There is no rebound and no guarding.  No significant tenderness, bowel sounds are hypoactive, urostomy with urine output and stents visible  Musculoskeletal:        General: Edema present. Normal range of motion.     Cervical back: Neck supple.     Comments: Edema as above   Lymphadenopathy:    She has no cervical adenopathy.  Neurological: She is alert and oriented to person, place, and time. She exhibits normal muscle tone.  Skin: Skin is warm and dry.  Psychiatric:  Alert and oriented x3, normal mood    Assessment/Plan: Small bowel obstruction likely related to previous surgeries -agree with admission by the hospitalist service in light of CKD, CHF, psychiatric issues and other medical problems.  No need for emergent surgery.  We will proceed with our small bowel obstruction protocol and follow closely.  I discussed this with her in detail.  Shelia Chan 04/08/2019, 10:12 PM

## 2019-04-08 NOTE — H&P (Addendum)
History and Physical    Caitrin Pendergraph DJT:[id number] DOB: Aug 27, 1955 DOA: 04/08/2019  PCP: Loretha Brasil, FNP   Patient coming from: Home   Chief Complaint: Abdominal pain, N/V/D   HPI: Shelia Chan is a 64 y.o. 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  Patient reports that she developed generalized abdominal pain approximately 3 days ago, is also had nausea with nonbloody vomiting and watery stool since then.  Symptoms have continued and are reminiscent of an SBO that she had previously which required bowel resection.  She denies any fevers, chills, cough, chest pain, or shortness of breath.  She denies melena or hematochezia.  Patient reports being cancer free since her surgery, recently moved to the area from [place], has established with a local urologist and oncologist but has not yet been to her appointments, and was hospitalized in a geri-psych unit last month with paranoia that has resolved with some medication changes.  Patient reports that her prior care was through a hospital in [place], [place] and [facility] in [place], [place].  ED Course: Upon arrival to the ED, patient is found to be afebrile, saturating well on room air, and with stable blood pressure.  EKG features a sinus rhythm.  CT the abdomen and pelvis is notable for prior cystectomy with ileal conduit and urostomy, left hydronephrosis with cortical thinning and atrophy suggestive of a longstanding process, ureteral stents, and suspected SBO.  Chemistry panel with creatinine of 2.35, similar to recent priors.  CBC notable for thrombocytosis with platelets 615,000.  Surgery was consulted by the ED physician, has evaluated patient in the emergency department, and recommends a medical admission with SBO protocol and  surgery to follow.   Review of Systems:  All other systems reviewed and apart from HPI, are negative.  Past Medical History:  Diagnosis Date  . Arthritis   . Asthma   . Blood transfusion without reported diagnosis   . Cancer ([place])   . CHF (congestive heart failure) ([place])   . COPD (chronic obstructive pulmonary disease) ([place])   . Immune deficiency disorder ([place])   . Renal disorder     Past Surgical History:  Procedure Laterality Date  . BLADDER REMOVAL  118/22/2015  . FRACTURE SURGERY Left 02/2018   pins     reports that she quit smoking about 2 months ago. Her smoking use included cigarettes. She has a 50.00 pack-year smoking history. She has never used smokeless tobacco. She reports previous alcohol use. She reports current drug use. Drug: Marijuana.  No Known Allergies  History reviewed. No pertinent family history.   Prior to Admission medications   Medication Sig Start Date End Date Taking? Authorizing Provider  ARIPiprazole (ABILIFY) 20 MG tablet Take 20 mg by mouth daily.  02/18/19   [provider]  atorvastatin (LIPITOR) 10 MG tablet Take 10 mg by mouth at bedtime.  02/18/19   [provider]  calcitRIOL (ROCALTROL) 0.25 MCG capsule Take 0.25 mcg by mouth daily. 02/18/19   [provider]  hydrOXYzine (ATARAX/VISTARIL) 50 MG tablet Take 50 mg by mouth at bedtime. 03/01/19   [provider]  metoprolol succinate (TOPROL-XL) 25 MG 24 hr tablet Take 25 mg by mouth daily. 02/18/19   [provider]  Multiple Vitamin (MULTIVITAMIN ADULT PO) Take 1 tablet by mouth daily.  [provider]  oxyCODONE-acetaminophen (PERCOCET/ROXICET) 5-325 MG tablet Take 1 tablet by mouth 3 (three) times daily as needed. 02/21/19   [provider]  pantoprazole (PROTONIX) 40 MG tablet Take 40 mg by mouth daily. 02/18/19   [provider]  POTASSIUM PO Take 1 tablet by mouth daily.    [provider]  traZODone (DESYREL) 50  MG tablet Take 50 mg by mouth at bedtime. 02/18/19   [provider]    Physical Exam: Vitals:   04/08/19 2157 04/08/19 2200 04/08/19 2215 04/08/19 2230  BP:  (!) 149/74 (!) 153/68 (!) 151/74  Pulse:  (!) 57 (!) 56 72  Resp:  15 15 15   Temp:      TempSrc:      SpO2: 97% 99% 97% 100%  Weight:      Height:        Constitutional: NAD, calm  Eyes: PERTLA, lids and conjunctivae normal ENMT: Mucous membranes are moist. Posterior pharynx clear of any exudate or lesions.   Neck: normal, supple, no masses, no thyromegaly Respiratory:  no wheezing, no crackles. No accessory muscle use.  Cardiovascular: S1 & S2 heard, regular rate and rhythm. No extremity edema.   Abdomen: soft, tender without rebound pain or guarding. Rare high-pitched bowel sound.  Musculoskeletal: no clubbing / cyanosis. No joint deformity upper and lower extremities.   Skin: no significant rashes, lesions, ulcers. Warm, dry, well-perfused. Neurologic: No gross facial asymmetry. Sensation intact. Moving all extremities.  Psychiatric: Alert and oriented to person, place, and situation. Very pleasant and cooperative.    Labs and Imaging on Admission: I have personally reviewed following labs and imaging studies  CBC: Recent Labs  Lab 04/08/19 1607  WBC 9.7  HGB 11.8*  HCT 36.6  MCV 87.1  PLT 431*   Basic Metabolic Panel: Recent Labs  Lab 04/08/19 1607  NA 137  K 3.8  CL 101  CO2 20*  GLUCOSE 149*  BUN 25*  CREATININE 2.35*  CALCIUM 9.8   GFR: Estimated Creatinine Clearance: 18.5 mL/min (A) (by C-G formula based on SCr of 2.35 mg/dL (H)). Liver Function Tests: Recent Labs  Lab 04/08/19 1607  AST 18  ALT 13  ALKPHOS 92  BILITOT 0.9  PROT 8.1  ALBUMIN 3.7   Recent Labs  Lab 04/08/19 1607  LIPASE 28   No results for input(s): AMMONIA in the last 168 hours. Coagulation Profile: No results for input(s): INR, PROTIME in the last 168 hours. Cardiac Enzymes: No results for input(s):  CKTOTAL, CKMB, CKMBINDEX, TROPONINI in the last 168 hours. BNP (last 3 results) No results for input(s): PROBNP in the last 8760 hours. HbA1C: No results for input(s): HGBA1C in the last 72 hours. CBG: No results for input(s): GLUCAP in the last 168 hours. Lipid Profile: No results for input(s): CHOL, HDL, LDLCALC, TRIG, CHOLHDL, LDLDIRECT in the last 72 hours. Thyroid Function Tests: No results for input(s): TSH, T4TOTAL, FREET4, T3FREE, THYROIDAB in the last 72 hours. Anemia Panel: No results for input(s): VITAMINB12, FOLATE, FERRITIN, TIBC, IRON, RETICCTPCT in the last 72 hours. Urine analysis:    Component Value Date/Time   COLORURINE YELLOW 04/08/2019 2123   APPEARANCEUR CLOUDY (A) 04/08/2019 2123   LABSPEC 1.012 04/08/2019 2123   PHURINE 6.0 04/08/2019 2123   GLUCOSEU NEGATIVE 04/08/2019 2123   HGBUR SMALL (A) 04/08/2019 2123   BILIRUBINUR NEGATIVE 04/08/2019 2123   KETONESUR NEGATIVE 04/08/2019 2123   PROTEINUR >=300 (A) 04/08/2019 2123   NITRITE NEGATIVE 04/08/2019 2123  LEUKOCYTESUR LARGE (A) 04/08/2019 2123   Sepsis Labs: @LABRCNTIP (procalcitonin:4,lacticidven:4) )No results found for this or any previous visit (from the past 240 hour(s)).   Radiological Exams on Admission: CT Abdomen Pelvis Wo Contrast  Result Date: 04/08/2019 CLINICAL DATA:  Abdominal pain EXAM: CT ABDOMEN AND PELVIS WITHOUT CONTRAST TECHNIQUE: Multidetector CT imaging of the abdomen and pelvis was performed following the standard protocol without IV contrast. COMPARISON:  None. FINDINGS: Lower chest: Lung bases are clear. No effusions. Heart is normal size. Hepatobiliary: No focal hepatic abnormality. Gallbladder unremarkable. Pancreas: No focal abnormality or ductal dilatation. Spleen: No focal abnormality.  Normal size. 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 Left kidney is atrophic with cortical thinning. Left hydronephrosis. Ureteral stents are in place. The  proximal aspect of the left ureteral stent is in the proximal to mid left ureter. These extend into the urostomy bag. No adrenal mass. Stomach/Bowel: There are dilated small bowel loops in the abdomen and pelvis. Postoperative changes within small bowel loops in the pelvis. Exact transition is not visualized, but appearance is compatible with small bowel obstruction. Small bowel loops in the pelvis near the operative level are dilated and fecalized. Vascular/Lymphatic: Aortic atherosclerosis. No enlarged abdominal or pelvic lymph nodes. Reproductive: Prior hysterectomy.  No adnexal masses. Other: No free fluid or free air. Musculoskeletal: No acute bony abnormality. IMPRESSION: Prior cystectomy. Ileal conduit with right lower quadrant urostomy. There is left hydronephrosis with left renal cortical thinning and left renal atrophy suggesting a longstanding process. Ureteral stents are in place. The left ureteral stent proximal aspect is in the mid left ureter. Dilated small bowel loops into the pelvis where there are fecalized small bowel loops near the surgical suture line. Findings compatible with small bowel obstruction. Exact transition not visualized. Aortic atherosclerosis. Electronically Signed   By: Rolm Baptise M.D.   On: 04/08/2019 21:40    EKG: Independently reviewed. Sinus rhythm, rate 65, QTc 418 ms.   Assessment/Plan   1. SBO  - Presents with 3 days of progressive abdominal pain and N/V/D, and has CT findings concerning for recurrent SBO  - Surgery is consulting and much appreciated  - NGT, SBO protocol, symptom-control   2. CKD IV  - SCr appears stable on admission  - Renally-dose medications, monitor    3. History of bladder cancer  - [place], was told that she is cancer-free and has established with local urologist at [facility] for mgmt of her ureteral stents   4. History of psychosis  - She was hospitalized last month with  paranoia, reports that Abilify was increased, trazodone added, and paranoia resolved  - Resume medications as soon as her condition allows, monitor    5. COPD  - Patient denies recent cough or wheeze, has quit smoking and rarely needs inhaler  - Continue albuterol as needed    DVT prophylaxis: SCD  Code Status: Full  Family Communication: Discussed with patient   Disposition Plan: Likely home in 3-4 days if SBO resolves with conservative management  Consults called: Surgery  Admission status: Inpatient     Vianne Bulls, MD [facility] Pager: See [email or web address]  If 7AM-7PM, please contact the daytime attending [email or web address]  04/08/2019, 10:46 PM

## 2019-04-08 NOTE — ED Provider Notes (Signed)
[facility] EMERGENCY DEPARTMENT Provider Note   CSN: [id number] Arrival date & time: 04/08/19  1452     History Chief Complaint  Patient presents with  . Abdominal Pain    Shelia Chan is a 64 y.o. female.  HPI Patient presenting for evaluation of nausea, vomiting and diarrhea; associated with abdominal pain for 3 days.  The patient was evaluated in the ED, 1 month ago for psychosis and transfer to a psychiatric facility.  She states that her medications were adjusted and she feels much better, now.  Today, she is worried that she has a "blockage," of her intestines.  She has had numerous episodes of vomiting, yellow thin material today.  She is also had 4 bowel movements that were stool color, and like water.  There has been no blood in emesis or diarrhea.  She denies fever, chills, cough or shortness of breath.  She had her urinary bladder removed because of bladder cancer there are no other known modifying factors.    Past Medical History:  Diagnosis Date  . Arthritis   . Asthma   . Blood transfusion without reported diagnosis   . Cancer ([place])   . CHF (congestive heart failure) ([place])   . COPD (chronic obstructive pulmonary disease) ([place])   . Immune deficiency disorder ([place])   . Renal disorder     Patient Active Problem List   Diagnosis Date Noted  . SBO (small bowel obstruction) ([place]) 04/08/2019  . CKD (chronic kidney disease), stage IV ([place]) 04/08/2019    Past Surgical History:  Procedure Laterality Date  . BLADDER REMOVAL  118/22/2015  . FRACTURE SURGERY Left 02/2018   pins     OB History   No obstetric history on file.     History reviewed. No pertinent family history.  Social History   Tobacco Use  . Smoking status: Former Smoker    Packs/day: 1.00    Years: 50.00    Pack years: 50.00    Types: Cigarettes    Quit date: 01/21/2019    Years since quitting: 0.2  . Smokeless tobacco: Never Used  Substance Use Topics  . Alcohol use:  Not Currently  . Drug use: Yes    Types: Marijuana    Comment: occasional     Home Medications Prior to Admission medications   Medication Sig Start Date End Date Taking? Authorizing Provider  ARIPiprazole (ABILIFY) 20 MG tablet Take 20 mg by mouth daily.  02/18/19   [provider]  atorvastatin (LIPITOR) 10 MG tablet Take 10 mg by mouth at bedtime.  02/18/19   [provider]  calcitRIOL (ROCALTROL) 0.25 MCG capsule Take 0.25 mcg by mouth daily. 02/18/19   [provider]  hydrOXYzine (ATARAX/VISTARIL) 50 MG tablet Take 50 mg by mouth at bedtime. 03/01/19   [provider]  metoprolol succinate (TOPROL-XL) 25 MG 24 hr tablet Take 25 mg by mouth daily. 02/18/19   [provider]  Multiple Vitamin (MULTIVITAMIN ADULT PO) Take 1 tablet by mouth daily.    [provider]  oxyCODONE-acetaminophen (PERCOCET/ROXICET) 5-325 MG tablet Take 1 tablet by mouth 3 (three) times daily as needed. 02/21/19   [provider]  pantoprazole (PROTONIX) 40 MG tablet Take 40 mg by mouth daily. 02/18/19   [provider]  POTASSIUM PO Take 1 tablet by mouth daily.    [provider]  traZODone (DESYREL) 50 MG tablet Take 50 mg by mouth at bedtime. 02/18/19   [provider]  Allergies    Patient has no known allergies.  Review of Systems   Review of Systems  Physical Exam Updated Vital Signs BP (!) 151/74   Pulse 72   Temp (!) 97.5 F (36.4 C) (Oral)   Resp 15   Ht 5' 1.5" (1.562 m)   Wt 57.6 kg   SpO2 100%   BMI 23.61 kg/m   Physical Exam  ED Results / Procedures / Treatments   Labs (all labs ordered are listed, but only abnormal results are displayed) Labs Reviewed  COMPREHENSIVE METABOLIC PANEL - Abnormal; Notable for the following components:      Result Value   CO2 20 (*)    Glucose, Bld 149 (*)    BUN 25 (*)    Creatinine, Ser 2.35 (*)    GFR calc non Af Amer 21 (*)    GFR calc Af Amer 24 (*)    Anion  gap 16 (*)    All other components within normal limits  CBC - Abnormal; Notable for the following components:   Hemoglobin 11.8 (*)    Platelets 615 (*)    All other components within normal limits  URINALYSIS, ROUTINE W REFLEX MICROSCOPIC - Abnormal; Notable for the following components:   APPearance CLOUDY (*)    Hgb urine dipstick SMALL (*)    Protein, ur >=300 (*)    Leukocytes,Ua LARGE (*)    WBC, UA >50 (*)    Bacteria, UA MANY (*)    All other components within normal limits  RESPIRATORY PANEL BY RT PCR (FLU A&B, COVID)  LIPASE, BLOOD    EKG EKG Interpretation  Date/Time:  Monday April 08 2019 16:02:02 EDT Ventricular Rate:  65 PR Interval:  118 QRS Duration: 80 QT Interval:  402 QTC Calculation: 418 R Axis:   68 Text Interpretation: Normal sinus rhythm Nonspecific ST abnormality Abnormal ECG Baseline wander since last tracing no significant change Confirmed by Daleen Bo [phone]) on 04/08/2019 8:49:11 PM   Radiology CT Abdomen Pelvis Wo Contrast  Result Date: 04/08/2019 CLINICAL DATA:  Abdominal pain EXAM: CT ABDOMEN AND PELVIS WITHOUT CONTRAST TECHNIQUE: Multidetector CT imaging of the abdomen and pelvis was performed following the standard protocol without IV contrast. COMPARISON:  None. FINDINGS: Lower chest: Lung bases are clear. No effusions. Heart is normal size. Hepatobiliary: No focal hepatic abnormality. Gallbladder unremarkable. Pancreas: No focal abnormality or ductal dilatation. Spleen: No focal abnormality.  Normal size. 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 Left kidney is atrophic with cortical thinning. Left hydronephrosis. Ureteral stents are in place. The proximal aspect of the left ureteral stent is in the proximal to mid left ureter. These extend into the urostomy bag. No adrenal mass. Stomach/Bowel: There are dilated small bowel loops in the abdomen and pelvis. Postoperative changes within small bowel loops in the  pelvis. Exact transition is not visualized, but appearance is compatible with small bowel obstruction. Small bowel loops in the pelvis near the operative level are dilated and fecalized. Vascular/Lymphatic: Aortic atherosclerosis. No enlarged abdominal or pelvic lymph nodes. Reproductive: Prior hysterectomy.  No adnexal masses. Other: No free fluid or free air. Musculoskeletal: No acute bony abnormality. IMPRESSION: Prior cystectomy. Ileal conduit with right lower quadrant urostomy. There is left hydronephrosis with left renal cortical thinning and left renal atrophy suggesting a longstanding process. Ureteral stents are in place. The left ureteral stent proximal aspect is in the mid left ureter. Dilated small bowel loops into the pelvis where there are fecalized small  bowel loops near the surgical suture line. Findings compatible with small bowel obstruction. Exact transition not visualized. Aortic atherosclerosis. Electronically Signed   By: Rolm Baptise M.D.   On: 04/08/2019 21:40    Procedures .Critical Care Performed by: Daleen Bo, MD Authorized by: Daleen Bo, MD   Critical care provider statement:    Critical care time (minutes):  35   Critical care start time:  04/08/2019 8:45 PM   Critical care end time:  04/08/2019 10:39 PM   Critical care time was exclusive of:  Separately billable procedures and treating other patients   Critical care was necessary to treat or prevent imminent or life-threatening deterioration of the following conditions: Bowel obstruction.   Critical care was time spent personally by me on the following activities:  Blood draw for specimens, development of treatment plan with patient or surrogate, discussions with consultants, evaluation of patient's response to treatment, examination of patient, obtaining history from patient or surrogate, ordering and performing treatments and interventions, ordering and review of laboratory studies, pulse oximetry, re-evaluation  of patient's condition, review of old charts and ordering and review of radiographic studies   (including critical care time)  Medications Ordered in ED Medications  sodium chloride flush (NS) 0.9 % injection 3 mL (3 mLs Intravenous Not Given 04/08/19 2119)  diatrizoate meglumine-sodium (GASTROGRAFIN) 66-10 % solution 90 mL (has no administration in time range)  ondansetron (ZOFRAN) injection 4 mg (has no administration in time range)  fentaNYL (SUBLIMAZE) injection 25-50 mcg (has no administration in time range)  famotidine (PEPCID) IVPB 20 mg premix (has no administration in time range)    ED Course  I have reviewed the triage vital signs and the nursing notes.  Pertinent labs & imaging results that were available during my care of the patient were reviewed by me and considered in my medical decision making (see chart for details).  Clinical Course as of Apr 07 2237  Mon Apr 08, 2019  2046 Normal except platelets high  CBC(!) [EW]  2046 Normal except CO2 low, glucose high, BUN high, creatinine high, GFR low, anion gap high  Comprehensive metabolic panel(!) [EW]  5397 Normal except presence of hemoglobin, protein, leukocytes, red cells, white cells, bacteria; collected from chronic urostomy bag  Urinalysis, Routine w reflex microscopic(!) [EW]  2145 Normal except hemoglobin low  CBC(!) [EW]  2145 Normal except CO2 low, glucose high, BUN high, creatinine high, GFR low  Comprehensive metabolic panel(!) [EW]  6734 Normal  Lipase, blood [EW]  2146 Per radiologist, consistent with small bowel obstruction.  Also chronic appearing hydronephrosis and somewhat displaced left ureter stent.  CT Abdomen Pelvis Wo Contrast [EW]  2157 Findings discussed with general surgery, Dr. Grandville Silos he will evaluate as a consultant and request hospitalist admission.   [EW]    Clinical Course User Index [EW] Daleen Bo, MD   MDM Rules/Calculators/A&P                       Patient Vitals for the  past 24 hrs:  BP Temp Temp src Pulse Resp SpO2 Height Weight  04/08/19 2230 (!) 151/74 - - 72 15 100 % - -  04/08/19 2215 (!) 153/68 - - (!) 56 15 97 % - -  04/08/19 2200 (!) 149/74 - - (!) 57 15 99 % - -  04/08/19 2157 - - - - - 97 % - -  04/08/19 2145 140/73 - - (!) 57 19 97 % - -  04/08/19  2115 (!) 153/81 - - (!) 56 (!) 21 100 % - -  04/08/19 1457 (!) 149/78 (!) 97.5 F (36.4 C) Oral 66 17 100 % 5' 1.5" (1.562 m) 57.6 kg    10:00 PM Reevaluation with update and discussion. After initial assessment and treatment, an updated evaluation reveals she is fairly comfortable at this time.  She agrees to proceed with NG tube placement, and hospitalization.  All questions answered. Daleen Bo   Medical Decision Making: Recurrent small bowel obstruction.  Patient previously had partial small bowel removal following obstruction secondary to adhesions.  Patient is clinically stable at this time but requires hospitalization for management of small bowel obstruction.  General surgery consulted and agrees to see patient.  Shelia Chan was evaluated in Emergency Department on 04/08/2019 for the symptoms described in the history of present illness. She was evaluated in the context of the global COVID-19 pandemic, which necessitated consideration that the patient might be at risk for infection with the SARS-CoV-2 virus that causes COVID-19. Institutional protocols and algorithms that pertain to the evaluation of patients at risk for COVID-19 are in a state of rapid change based on information released by regulatory bodies including the CDC and federal and state organizations. These policies and algorithms were followed during the patient's care in the ED.   CRITICAL CARE-yes Performed by: Daleen Bo   Nursing Notes Reviewed/ Care Coordinated Applicable Imaging Reviewed Interpretation of Laboratory Data incorporated into ED treatment  Case discussed with hospitalist who will admit the patient.    Final Clinical Impression(s) / ED Diagnoses Final diagnoses:  Encounter for imaging study to confirm nasogastric (NG) tube placement  Small bowel obstruction [facility])    Rx / DC Orders ED Discharge Orders    None       Daleen Bo, MD 04/08/19 2239

## 2019-04-08 NOTE — ED Notes (Signed)
Pt transported to CT ?

## 2019-04-08 NOTE — ED Triage Notes (Signed)
abd pain   X 3 days reports N/V/d

## 2019-04-09 ENCOUNTER — Inpatient Hospital Stay (HOSPITAL_COMMUNITY): Payer: Medicare HMO

## 2019-04-09 ENCOUNTER — Other Ambulatory Visit: Payer: Self-pay

## 2019-04-09 DIAGNOSIS — Z8551 Personal history of malignant neoplasm of bladder: Secondary | ICD-10-CM

## 2019-04-09 DIAGNOSIS — J449 Chronic obstructive pulmonary disease, unspecified: Secondary | ICD-10-CM

## 2019-04-09 LAB — CBC WITH DIFFERENTIAL/PLATELET
Abs Immature Granulocytes: 0.02 10*3/uL (ref 0.00–0.07)
Basophils Absolute: 0 10*3/uL (ref 0.0–0.1)
Basophils Relative: 0 %
Eosinophils Absolute: 0 10*3/uL (ref 0.0–0.5)
Eosinophils Relative: 0 %
HCT: 32.5 % — ABNORMAL LOW (ref 36.0–46.0)
Hemoglobin: 10.7 g/dL — ABNORMAL LOW (ref 12.0–15.0)
Immature Granulocytes: 0 %
Lymphocytes Relative: 11 %
Lymphs Abs: 0.9 10*3/uL (ref 0.7–4.0)
MCH: 28.5 pg (ref 26.0–34.0)
MCHC: 32.9 g/dL (ref 30.0–36.0)
MCV: 86.4 fL (ref 80.0–100.0)
Monocytes Absolute: 0.9 10*3/uL (ref 0.1–1.0)
Monocytes Relative: 11 %
Neutro Abs: 6.7 10*3/uL (ref 1.7–7.7)
Neutrophils Relative %: 78 %
Platelets: 524 10*3/uL — ABNORMAL HIGH (ref 150–400)
RBC: 3.76 MIL/uL — ABNORMAL LOW (ref 3.87–5.11)
RDW: 14.6 % (ref 11.5–15.5)
WBC: 8.6 10*3/uL (ref 4.0–10.5)
nRBC: 0 % (ref 0.0–0.2)

## 2019-04-09 LAB — BASIC METABOLIC PANEL
Anion gap: 14 (ref 5–15)
BUN: 29 mg/dL — ABNORMAL HIGH (ref 8–23)
CO2: 23 mmol/L (ref 22–32)
Calcium: 9.1 mg/dL (ref 8.9–10.3)
Chloride: 104 mmol/L (ref 98–111)
Creatinine, Ser: 2.36 mg/dL — ABNORMAL HIGH (ref 0.44–1.00)
GFR calc Af Amer: 24 mL/min — ABNORMAL LOW (ref >60)
GFR calc non Af Amer: 21 mL/min — ABNORMAL LOW (ref >60)
Glucose, Bld: 120 mg/dL — ABNORMAL HIGH (ref 70–99)
Potassium: 3.6 mmol/L (ref 3.5–5.1)
Sodium: 141 mmol/L (ref 135–145)

## 2019-04-09 LAB — HIV ANTIBODY (ROUTINE TESTING W REFLEX): HIV Screen 4th Generation wRfx: NONREACTIVE

## 2019-04-09 MED ORDER — HEPARIN SODIUM (PORCINE) 5000 UNIT/ML IJ SOLN
5000.0000 [IU] | Freq: Three times a day (TID) | INTRAMUSCULAR | Status: DC
  Administered 2019-04-09 – 2019-04-10 (×4): 5000 [IU] via SUBCUTANEOUS
  Filled 2019-04-09 (×4): qty 1

## 2019-04-09 MED ORDER — PNEUMOCOCCAL VAC POLYVALENT 25 MCG/0.5ML IJ INJ
0.5000 mL | INJECTION | INTRAMUSCULAR | Status: AC
  Administered 2019-04-10: 15:00:00 0.5 mL via INTRAMUSCULAR
  Filled 2019-04-09: qty 0.5

## 2019-04-09 MED ORDER — PROMETHAZINE HCL 25 MG/ML IJ SOLN
12.5000 mg | Freq: Four times a day (QID) | INTRAMUSCULAR | Status: DC | PRN
  Administered 2019-04-09: 18:00:00 12.5 mg via INTRAVENOUS
  Filled 2019-04-09: qty 1

## 2019-04-09 NOTE — Progress Notes (Signed)
[facility] Progress Note     Subjective: CC:  Abdominal pain  And distention improved. Reports one small, loose BM overnight. One episode flatus this AM. Significant nausea yesterday but now improving.   NG- 300cc/24h documented  Objective: Vital signs in last 24 hours: Temp:  [97.5 F (36.4 C)-98.4 F (36.9 C)] 97.9 F (36.6 C) (03/23 0734) Pulse Rate:  [56-72] 63 (03/23 0734) Resp:  [15-21] 17 (03/23 0734) BP: (135-153)/(68-95) 135/71 (03/23 0734) SpO2:  [97 %-100 %] 100 % (03/23 0734) Weight:  [57.6 kg] 57.6 kg (03/22 1457) Last BM Date: 04/09/19  Intake/Output from previous day: 03/22 0701 - 03/23 0700 In: 264 [I.V.:214; IV Piggyback:50] Out: 675 [Urine:375; Emesis/NG output:300] Intake/Output this shift: Total I/O In: 0  Out: 150 [Urine:150]  PE: Gen:  Alert, NAD, pleasant Card:  Regular rate and rhythm Pulm:  Normal effort Abd: Soft, non-tender, non-distended, RLQ urostomy in place  NGT - clear/non-bilious, NGT clamped during my exam Skin: warm and dry, no rashes  Psych: A&Ox3   Lab Results:  Recent Labs    04/08/19 1607 04/09/19 0409  WBC 9.7 8.6  HGB 11.8* 10.7*  HCT 36.6 32.5*  PLT 615* 524*   BMET Recent Labs    04/08/19 1607 04/09/19 0409  NA 137 141  K 3.8 3.6  CL 101 104  CO2 20* 23  GLUCOSE 149* 120*  BUN 25* 29*  CREATININE 2.35* 2.36*  CALCIUM 9.8 9.1   CMP     Component Value Date/Time   NA 141 04/09/2019 0409   K 3.6 04/09/2019 0409   CL 104 04/09/2019 0409   CO2 23 04/09/2019 0409   GLUCOSE 120 (H) 04/09/2019 0409   BUN 29 (H) 04/09/2019 0409   CREATININE 2.36 (H) 04/09/2019 0409   CALCIUM 9.1 04/09/2019 0409   PROT 8.1 04/08/2019 1607   ALBUMIN 3.7 04/08/2019 1607   AST 18 04/08/2019 1607   ALT 13 04/08/2019 1607   ALKPHOS 92 04/08/2019 1607   BILITOT 0.9 04/08/2019 1607   GFRNONAA 21 (L) 04/09/2019 0409   GFRAA 24 (L) 04/09/2019 0409   Lipase     Component Value Date/Time   LIPASE 28 04/08/2019  1607   Studies/Results: CT Abdomen Pelvis Wo Contrast  Result Date: 04/08/2019 CLINICAL DATA:  Abdominal pain EXAM: CT ABDOMEN AND PELVIS WITHOUT CONTRAST TECHNIQUE: Multidetector CT imaging of the abdomen and pelvis was performed following the standard protocol without IV contrast. COMPARISON:  None. FINDINGS: Lower chest: Lung bases are clear. No effusions. Heart is normal size. Hepatobiliary: No focal hepatic abnormality. Gallbladder unremarkable. Pancreas: No focal abnormality or ductal dilatation. Spleen: No focal abnormality.  Normal size. 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 Left kidney is atrophic with cortical thinning. Left hydronephrosis. Ureteral stents are in place. The proximal aspect of the left ureteral stent is in the proximal to mid left ureter. These extend into the urostomy bag. No adrenal mass. Stomach/Bowel: There are dilated small bowel loops in the abdomen and pelvis. Postoperative changes within small bowel loops in the pelvis. Exact transition is not visualized, but appearance is compatible with small bowel obstruction. Small bowel loops in the pelvis near the operative level are dilated and fecalized. Vascular/Lymphatic: Aortic atherosclerosis. No enlarged abdominal or pelvic lymph nodes. Reproductive: Prior hysterectomy.  No adnexal masses. Other: No free fluid or free air. Musculoskeletal: No acute bony abnormality. IMPRESSION: Prior cystectomy. Ileal conduit with right lower quadrant urostomy. There is left hydronephrosis with left renal cortical  thinning and left renal atrophy suggesting a longstanding process. Ureteral stents are in place. The left ureteral stent proximal aspect is in the mid left ureter. Dilated small bowel loops into the pelvis where there are fecalized small bowel loops near the surgical suture line. Findings compatible with small bowel obstruction. Exact transition not visualized. Aortic atherosclerosis. Electronically  Signed   By: Rolm Baptise M.D.   On: 04/08/2019 21:40   DG Abd Portable 1V-Small Bowel Obstruction Protocol-initial, 8 hr delay  Result Date: 04/09/2019 CLINICAL DATA:  Follow up small bowel obstruction EXAM: PORTABLE ABDOMEN - 1 VIEW COMPARISON:  04/08/2019 FINDINGS: Scattered large and small bowel gas is noted. Previously administered contrast has passed through the small-bowel and now lies within the colon. A small amount of residual contrast is noted in the distal small bowel. Persistent small bowel dilatation is noted consistent with partial small bowel obstruction. The gastric catheter is been removed in the interval. No free air is noted. IMPRESSION: Continuing changes of partial small bowel obstruction as described. Electronically Signed   By: Inez Catalina M.D.   On: 04/09/2019 08:58   DG Abd Portable 1V-Small Bowel Protocol-Position Verification  Result Date: 04/08/2019 CLINICAL DATA:  64 year old female enteric tube placement. EXAM: PORTABLE ABDOMEN - 1 VIEW COMPARISON:  CT Abdomen and Pelvis earlier tonight. FINDINGS: Portable AP upright view at 2345 hours. Bilateral ureteral stents redemonstrated in association with an ileal conduit. Enteric tube has been placed into the stomach with side hole at the level of the gastric body. Negative lung bases. Stable bowel gas pattern. No acute osseous abnormality identified. IMPRESSION: Enteric tube placed, side hole at the level of the gastric body. Electronically Signed   By: Genevie Ann M.D.   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 if passes clamp trial today may ADAT this afternoon ID:  none Foley: no VTE: SCD's, ok for chemical VTE prophylaxis from a surgical perspective    LOS: 1 day    Obie Dredge, [facility] Surgery Pager: [phone]

## 2019-04-09 NOTE — Progress Notes (Signed)
1300: Patient tolerated clear liquid diet, denies nausea an hour after meal.  1800: patient had an episode of nausea/vomiting, 100cc of clear emesis noted. MD notified and added phenergan to regimen. Patient state will try to eat dinner once she feels better. Will cont to monitor.

## 2019-04-09 NOTE — Plan of Care (Signed)

## 2019-04-09 NOTE — Plan of Care (Signed)
?  Problem: Elimination: ?Goal: Will not experience complications related to bowel motility ?Outcome: Progressing ?  ?Problem: Pain Managment: ?Goal: General experience of comfort will improve ?Outcome: Progressing ?  ?Problem: Safety: ?Goal: Ability to remain free from injury will improve ?Outcome: Progressing ?  ?

## 2019-04-09 NOTE — Progress Notes (Signed)
NG tube d/c'd, patient tolerated well. No c/o nausea/vomoting at this time. Patient placed order for clear liq diet. Will cont to monitor.

## 2019-04-09 NOTE — Progress Notes (Signed)
[facility]  PROGRESS NOTE    Shelia Chan  VEL:[id number] DOB: 24-Oct-1955 DOA: 04/08/2019 PCP: Loretha Brasil, FNP   Brief Narrative:   Shelia Chan is a 64 y.o. 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  Patient reports that she developed generalized abdominal pain approximately 3 days ago, is also had nausea with nonbloody vomiting and watery stool since then.  Symptoms have continued and are reminiscent of an SBO that she had previously which required bowel resection.  She denies any fevers, chills, cough, chest pain, or shortness of breath.  She denies melena or hematochezia.  Patient reports being cancer free since her surgery, recently moved to the area from [place], has established with a local urologist and oncologist but has not yet been to her appointments, and was hospitalized in a geri-psych unit last month with paranoia that has resolved with some medication changes.  Patient reports that her prior care was through a hospital in [place], [place] and [facility] in [place], [place].  04/09/19: To have NGT pulled today and start on CLD. Home tomorrow if stable.   Assessment & Plan:   Principal Problem:   Small bowel obstruction (HCC) Active Problems:   CKD (chronic kidney disease), stage IV (HCC)  SBO      - Presents with 3 days of progressive abdominal pain and N/V/D, and has CT findings concerning for recurrent SBO      - NGT, SBO protocol, symptom-control     - surgery onboard     - 3/23: having flatus per surgery, NGT to be pulled, starting on CLD, possible home in AM if stable   CKD IV      - SCr appears stable on admission      - Renally-dose medications, monitor     - 3/23: she is at her baseline, monitor   History of bladder cancer      - Patient [place]  [place], was told that she is cancer-free and has established with local urologist at [facility] for mgmt of her ureteral stents; maintain outpt follow up  History of psychosis      - She was hospitalized last month with paranoia, reports that Abilify was increased, trazodone added, and paranoia resolved      - Resume medications as soon as her condition allows, monitor     - 3/23: mentation is stable this AM    COPD      - Patient denies recent cough or wheeze, has quit smoking and rarely needs inhaler      - Continue albuterol as needed     - 3/23: denies complaints this AM   DVT prophylaxis: SCDs/heparin Code Status: FULL   Disposition Plan: Home after resolution of SBO and tolerating diet.  Consultants:   General Surgery  ROS:  Denies CP, N, V . Remainder 10-pt ROS is negative for all not previously mentioned.  Subjective: "I get them all the time."  Objective: Vitals:   04/09/19 0408 04/09/19 0734 04/09/19 1146 04/09/19 1428  BP: (!) 143/84 135/71 135/73 128/68  Pulse: 60 63 (!) 58 62  Resp: 17 17 20 17   Temp: 98.4 F (36.9 C) 97.9 F (36.6 C) 97.6 F (36.4 C) 98.9 F (37.2 C)  TempSrc: Oral Oral Oral Oral  SpO2: 99% 100%  100% 97%  Weight:      Height:        Intake/Output Summary (Last 24 hours) at 04/09/2019 1535 Last data filed at 04/09/2019 1500 Gross per 24 hour  Intake 263.97 ml  Output 1375 ml  Net -1111.03 ml   Filed Weights   04/08/19 1457  Weight: 57.6 kg    Examination:  General: 64 y.o. female resting in bed in NAD Cardiovascular: RRR, +S1, S2, no m/g/r, equal pulses throughout Respiratory: CTABL, no w/r/r, normal WOB GI: BS+, NDNT, no masses noted, no organomegaly noted, NGT in place MSK: No e/c/c Neuro: Alert to name, follows commands Psyc: Appropriate interaction and affect, calm/cooperative   Data Reviewed: I have personally reviewed following labs and imaging studies.  CBC: Recent Labs    Lab 04/08/19 1607 04/09/19 0409  WBC 9.7 8.6  NEUTROABS  --  6.7  HGB 11.8* 10.7*  HCT 36.6 32.5*  MCV 87.1 86.4  PLT 615* 774*   Basic Metabolic Panel: Recent Labs  Lab 04/08/19 1607 04/09/19 0409  NA 137 141  K 3.8 3.6  CL 101 104  CO2 20* 23  GLUCOSE 149* 120*  BUN 25* 29*  CREATININE 2.35* 2.36*  CALCIUM 9.8 9.1   GFR: Estimated Creatinine Clearance: 18.4 mL/min (A) (by C-G formula based on SCr of 2.36 mg/dL (H)). Liver Function Tests: Recent Labs  Lab 04/08/19 1607  AST 18  ALT 13  ALKPHOS 92  BILITOT 0.9  PROT 8.1  ALBUMIN 3.7   Recent Labs  Lab 04/08/19 1607  LIPASE 28   No results for input(s): AMMONIA in the last 168 hours. Coagulation Profile: No results for input(s): INR, PROTIME in the last 168 hours. Cardiac Enzymes: No results for input(s): CKTOTAL, CKMB, CKMBINDEX, TROPONINI in the last 168 hours. BNP (last 3 results) No results for input(s): PROBNP in the last 8760 hours. HbA1C: No results for input(s): HGBA1C in the last 72 hours. CBG: No results for input(s): GLUCAP in the last 168 hours. Lipid Profile: No results for input(s): CHOL, HDL, LDLCALC, TRIG, CHOLHDL, LDLDIRECT in the last 72 hours. Thyroid Function Tests: No results for input(s): TSH, T4TOTAL, FREET4, T3FREE, THYROIDAB in the last 72 hours. Anemia Panel: No results for input(s): VITAMINB12, FOLATE, FERRITIN, TIBC, IRON, RETICCTPCT in the last 72 hours. Sepsis Labs: No results for input(s): PROCALCITON, LATICACIDVEN in the last 168 hours.  Recent Results (from the past 240 hour(s))  Respiratory Panel by RT PCR (Flu A&B, Covid) - Nasopharyngeal Swab     Status: None   Collection Time: 04/08/19 10:02 PM   Specimen: Nasopharyngeal Swab  Result Value Ref Range Status   SARS Coronavirus 2 by RT PCR NEGATIVE NEGATIVE Final    Comment: (NOTE) SARS-CoV-2 target nucleic acids are NOT DETECTED. The SARS-CoV-2 RNA is generally detectable in upper respiratoy specimens during  the acute phase of infection. The lowest concentration of SARS-CoV-2 viral copies this assay can detect is 131 copies/mL. A negative result does not preclude SARS-Cov-2 infection and should not be used as the sole basis for treatment or other patient management decisions. A negative result may occur with  improper specimen collection/handling, submission of specimen other than nasopharyngeal swab, presence of viral mutation(s) within the areas targeted by this assay, and inadequate number of viral copies (<131 copies/mL). A negative result must be combined with clinical observations, patient history, and epidemiological information. The expected result is Negative. Fact Sheet for Patients:  PinkCheek.be Fact Sheet for Healthcare Providers:  GravelBags.it This test  is not yet ap proved or cleared by the [place] and  has been authorized for detection and/or diagnosis of SARS-CoV-2 by FDA under an Emergency Use Authorization (EUA). This EUA will remain  in effect (meaning this test can be used) for the duration of the COVID-19 declaration under Section 564(b)(1) of the Act, 21 U.S.C. section 360bbb-3(b)(1), unless the authorization is terminated or revoked sooner.    Influenza A by PCR NEGATIVE NEGATIVE Final   Influenza B by PCR NEGATIVE NEGATIVE Final    Comment: (NOTE) The Xpert Xpress SARS-CoV-2/FLU/RSV assay is intended as an aid in  the diagnosis of influenza from Nasopharyngeal swab specimens and  should not be used as a sole basis for treatment. Nasal washings and  aspirates are unacceptable for Xpert Xpress SARS-CoV-2/FLU/RSV  testing. Fact Sheet for Patients: PinkCheek.be Fact Sheet for Healthcare Providers: GravelBags.it This test is not yet approved or cleared by the [place] FDA and  has been authorized for detection and/or diagnosis of  SARS-CoV-2 by  FDA under an Emergency Use Authorization (EUA). This EUA will remain  in effect (meaning this test can be used) for the duration of the  Covid-19 declaration under Section 564(b)(1) of the Act, 21  U.S.C. section 360bbb-3(b)(1), unless the authorization is  terminated or revoked. Performed at [facility], [place] [address], [place], [place] [address]       Radiology Studies: CT Abdomen Pelvis Wo Contrast  Result Date: 04/08/2019 CLINICAL DATA:  Abdominal pain EXAM: CT ABDOMEN AND PELVIS WITHOUT CONTRAST TECHNIQUE: Multidetector CT imaging of the abdomen and pelvis was performed following the standard protocol without IV contrast. COMPARISON:  None. FINDINGS: Lower chest: Lung bases are clear. No effusions. Heart is normal size. Hepatobiliary: No focal hepatic abnormality. Gallbladder unremarkable. Pancreas: No focal abnormality or ductal dilatation. Spleen: No focal abnormality.  Normal size. 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 Left kidney is atrophic with cortical thinning. Left hydronephrosis. Ureteral stents are in place. The proximal aspect of the left ureteral stent is in the proximal to mid left ureter. These extend into the urostomy bag. No adrenal mass. Stomach/Bowel: There are dilated small bowel loops in the abdomen and pelvis. Postoperative changes within small bowel loops in the pelvis. Exact transition is not visualized, but appearance is compatible with small bowel obstruction. Small bowel loops in the pelvis near the operative level are dilated and fecalized. Vascular/Lymphatic: Aortic atherosclerosis. No enlarged abdominal or pelvic lymph nodes. Reproductive: Prior hysterectomy.  No adnexal masses. Other: No free fluid or free air. Musculoskeletal: No acute bony abnormality. IMPRESSION: Prior cystectomy. Ileal conduit with right lower quadrant urostomy. There is left hydronephrosis with left renal cortical thinning and left renal  atrophy suggesting a longstanding process. Ureteral stents are in place. The left ureteral stent proximal aspect is in the mid left ureter. Dilated small bowel loops into the pelvis where there are fecalized small bowel loops near the surgical suture line. Findings compatible with small bowel obstruction. Exact transition not visualized. Aortic atherosclerosis. Electronically Signed   By: Rolm Baptise M.D.   On: 04/08/2019 21:40   DG Abd Portable 1V-Small Bowel Obstruction Protocol-initial, 8 hr delay  Result Date: 04/09/2019 CLINICAL DATA:  Follow up small bowel obstruction EXAM: PORTABLE ABDOMEN - 1 VIEW COMPARISON:  04/08/2019 FINDINGS: Scattered large and small bowel gas is noted. Previously administered contrast has passed through the small-bowel and now lies within the colon. A small amount of residual contrast is noted in  the distal small bowel. Persistent small bowel dilatation is noted consistent with partial small bowel obstruction. The gastric catheter is been removed in the interval. No free air is noted. IMPRESSION: Continuing changes of partial small bowel obstruction as described. Electronically Signed   By: Inez Catalina M.D.   On: 04/09/2019 08:58   DG Abd Portable 1V-Small Bowel Protocol-Position Verification  Result Date: 04/08/2019 CLINICAL DATA:  64 year old female enteric tube placement. EXAM: PORTABLE ABDOMEN - 1 VIEW COMPARISON:  CT Abdomen and Pelvis earlier tonight. FINDINGS: Portable AP upright view at 2345 hours. Bilateral ureteral stents redemonstrated in association with an ileal conduit. Enteric tube has been placed into the stomach with side hole at the level of the gastric body. Negative lung bases. Stable bowel gas pattern. No acute osseous abnormality identified. IMPRESSION: Enteric tube placed, side hole at the level of the gastric body. Electronically Signed   By: Genevie Ann M.D.   On: 04/08/2019 23:53     Scheduled Meds: . [START ON 04/10/2019] pneumococcal 23 valent  vaccine  0.5 mL Intramuscular Tomorrow-1000  . sodium chloride flush  3 mL Intravenous Once   Continuous Infusions: . sodium chloride 85 mL/hr at 04/09/19 1032  . famotidine (PEPCID) IV 20 mg (04/09/19 1034)     LOS: 1 day    Time spent: 25 minutes spent in the coordination of care today.    Jonnie Finner, DO [facility]  If 7PM-7AM, please contact night-coverage [email or web address] 04/09/2019, 3:35 PM

## 2019-04-10 LAB — COMPREHENSIVE METABOLIC PANEL
ALT: 11 U/L (ref 0–44)
AST: 13 U/L — ABNORMAL LOW (ref 15–41)
Albumin: 2.9 g/dL — ABNORMAL LOW (ref 3.5–5.0)
Alkaline Phosphatase: 74 U/L (ref 38–126)
Anion gap: 9 (ref 5–15)
BUN: 20 mg/dL (ref 8–23)
CO2: 23 mmol/L (ref 22–32)
Calcium: 8.3 mg/dL — ABNORMAL LOW (ref 8.9–10.3)
Chloride: 105 mmol/L (ref 98–111)
Creatinine, Ser: 2.03 mg/dL — ABNORMAL HIGH (ref 0.44–1.00)
GFR calc Af Amer: 29 mL/min — ABNORMAL LOW (ref >60)
GFR calc non Af Amer: 25 mL/min — ABNORMAL LOW (ref >60)
Glucose, Bld: 96 mg/dL (ref 70–99)
Potassium: 3.4 mmol/L — ABNORMAL LOW (ref 3.5–5.1)
Sodium: 137 mmol/L (ref 135–145)
Total Bilirubin: 0.7 mg/dL (ref 0.3–1.2)
Total Protein: 6.5 g/dL (ref 6.5–8.1)

## 2019-04-10 LAB — CBC WITH DIFFERENTIAL/PLATELET
Abs Immature Granulocytes: 0.01 10*3/uL (ref 0.00–0.07)
Basophils Absolute: 0 10*3/uL (ref 0.0–0.1)
Basophils Relative: 1 %
Eosinophils Absolute: 0.1 10*3/uL (ref 0.0–0.5)
Eosinophils Relative: 2 %
HCT: 30 % — ABNORMAL LOW (ref 36.0–46.0)
Hemoglobin: 9.5 g/dL — ABNORMAL LOW (ref 12.0–15.0)
Immature Granulocytes: 0 %
Lymphocytes Relative: 25 %
Lymphs Abs: 1.2 10*3/uL (ref 0.7–4.0)
MCH: 28.5 pg (ref 26.0–34.0)
MCHC: 31.7 g/dL (ref 30.0–36.0)
MCV: 90.1 fL (ref 80.0–100.0)
Monocytes Absolute: 0.5 10*3/uL (ref 0.1–1.0)
Monocytes Relative: 11 %
Neutro Abs: 2.8 10*3/uL (ref 1.7–7.7)
Neutrophils Relative %: 61 %
Platelets: 409 10*3/uL — ABNORMAL HIGH (ref 150–400)
RBC: 3.33 MIL/uL — ABNORMAL LOW (ref 3.87–5.11)
RDW: 14.6 % (ref 11.5–15.5)
WBC: 4.7 10*3/uL (ref 4.0–10.5)
nRBC: 0 % (ref 0.0–0.2)

## 2019-04-10 LAB — MAGNESIUM: Magnesium: 1.6 mg/dL — ABNORMAL LOW (ref 1.7–2.4)

## 2019-04-10 MED ORDER — MAGNESIUM OXIDE 400 (241.3 MG) MG PO TABS
400.0000 mg | ORAL_TABLET | Freq: Once | ORAL | Status: AC
  Administered 2019-04-10: 15:00:00 400 mg via ORAL
  Filled 2019-04-10: qty 1

## 2019-04-10 MED ORDER — MAGNESIUM SULFATE 2 GM/50ML IV SOLN: 2.0000 g | Freq: Once | INTRAVENOUS | Status: DC

## 2019-04-10 MED ORDER — POTASSIUM CHLORIDE 20 MEQ PO PACK
40.0000 meq | PACK | Freq: Once | ORAL | Status: AC
  Administered 2019-04-10: 15:00:00 40 meq via ORAL
  Filled 2019-04-10: qty 2

## 2019-04-10 NOTE — Plan of Care (Signed)
  Problem: Education: Goal: Knowledge of General Education information will improve Description: Including pain rating scale, medication(s)/side effects and non-pharmacologic comfort measures Outcome: Progressing   Problem: Health Behavior/Discharge Planning: Goal: Ability to manage health-related needs will improve Outcome: Progressing   Problem: Nutrition: Goal: Adequate nutrition will be maintained Outcome: Progressing   Problem: Coping: Goal: Level of anxiety will decrease Outcome: Progressing   Problem: Elimination: Goal: Will not experience complications related to bowel motility Outcome: Progressing   Problem: Pain Managment: Goal: General experience of comfort will improve Outcome: Progressing   Problem: Safety: Goal: Ability to remain free from injury will improve Outcome: Progressing   Problem: Skin Integrity: Goal: Risk for impaired skin integrity will decrease Outcome: Progressing   

## 2019-04-10 NOTE — Discharge Instructions (Signed)
Bowel Obstruction A bowel obstruction is a blockage in the small or large bowel. The bowel, which is also called the intestine, is a long, slender tube that connects the stomach to the anus. When a person eats and drinks, food and fluids go from the mouth to the stomach to the small bowel. This is where most of the nutrients in the food and fluids are absorbed. After the small bowel, material passes through the large bowel for further absorption until any leftover material leaves the body as stool through the anus during a bowel movement. A bowel obstruction will prevent food and fluids from passing through the bowel as they normally do during digestion. The bowel can become partially or completely blocked. If this condition is not treated, it can be dangerous because the bowel could rupture. What are the causes? Common causes of this condition include:  Scar tissue (adhesions) from previous surgery or treatment with high-energy X-rays (radiation).  Recent surgery. This may cause the movements of the bowel to slow down and cause food to block the intestine.  Inflammatory bowel disease, such as Crohn's disease or diverticulitis.  Growths or tumors.  A bulging organ (hernia).  Twisting of the bowel (volvulus).  A foreign body.  Slipping of a part of the bowel into another part (intussusception). What are the signs or symptoms? Symptoms of this condition include:  Pain in the abdomen. Depending on the degree of obstruction, pain may be: ? Mild or severe. ? Dull cramping or sharp pain. ? In one area or in the entire abdomen.  Nausea and vomiting. Vomit may be greenish or a yellow bile color.  Bloating in the abdomen.  Difficulty passing stool (constipation).  Lack of passing gas.  Frequent belching.  Diarrhea. This may occur if the obstruction is partial and runny stool is able to leak around the obstruction. How is this diagnosed? This condition may be diagnosed based on:  A  physical exam.  Medical history.  Imaging tests of the abdomen or pelvis, such as X-ray or CT scan.  Blood or urine tests. How is this treated? Treatment for this condition depends on the cause and severity of the problem. Treatment may include:  Fluids and pain medicines that are given through an IV. Your health care provider may instruct you not to eat or drink if you have nausea or vomiting.  Eating a simple diet. You may be asked to consume a clear liquid diet for several days. This allows the bowel to rest.  Placement of a small tube (nasogastric tube) into the stomach. This will relieve pain, discomfort, and nausea by removing blocked air and fluids from the stomach. It can also help the obstruction clear up faster.  Surgery. This may be required if other treatments do not work. Surgery may be required for: ? Bowel obstruction from a hernia. This can be an emergency procedure. ? Scar tissue that causes frequent or severe obstructions. Follow these instructions at home: Medicines  Take over-the-counter and prescription medicines only as told by your health care provider.  If you were prescribed an antibiotic medicine, take it as told by your health care provider. Do not stop taking the antibiotic even if you start to feel better. General instructions  Follow instructions from your health care provider about eating restrictions. You may need to avoid solid foods and consume only clear liquids until your condition improves.  Return to your normal activities as told by your health care provider. Ask your health care   provider what activities are safe for you.  Avoid sitting for a long time without moving. Get up to take short walks every 1-2 hours. This is important to improve blood flow and breathing. Ask for help if you feel weak or unsteady.  Keep all follow-up visits as told by your health care provider. This is important. How is this prevented? After having a bowel  obstruction, you are more likely to have another. You may do the following things to prevent another obstruction:  If you have a long-term (chronic) disease, pay attention to your symptoms and contact your health care provider if you have questions or concerns.  Avoid becoming constipated. To prevent or treat constipation, your health care provider may recommend that you: ? Drink enough fluid to keep your urine pale yellow. ? Take over-the-counter or prescription medicines. ? Eat foods that are high in fiber, such as beans, whole grains, and fresh fruits and vegetables. ? Limit foods that are high in fat and processed sugars, such as fried or sweet foods.  Stay active. Exercise for 30 minutes or more, 5 or more days each week. Ask your health care provider which exercises are safe for you.  Avoid stress. Find ways to reduce stress, such as meditation, exercise, or taking time for activities that relax you.  Instead of eating three large meals each day, eat three small meals with three small snacks.  Work with a Microbiologist to make a healthy meal plan that works for you.  Do not use any products that contain nicotine or tobacco, such as cigarettes and e-cigarettes. If you need help quitting, ask your health care provider. Contact a health care provider if you:  Have a fever.  Have chills. Get help right away if you:  Have increased pain or cramping.  Vomit blood.  Have uncontrolled vomiting or nausea.  Cannot drink fluids because of vomiting or pain.  Become confused.  Begin feeling very thirsty (dehydrated).  Have severe bloating.  Feel extremely weak or you faint. Summary  A bowel obstruction is a blockage in the small or large bowel.  A bowel obstruction will prevent food and fluids from passing through the bowel as they normally do during digestion.  Treatment for this condition depends on the cause and severity of the problem. It may include fluids and pain medicines  through an IV, a simple diet, a nasogastric tube, or surgery.  Follow instructions from your health care provider about eating restrictions. You may need to avoid solid foods and consume only clear liquids until your condition improves. This information is not intended to replace advice given to you by your health care provider. Make sure you discuss any questions you have with your health care provider. Document Revised: 02/09/2018 Document Reviewed: 05/17/2017 Elsevier Patient Education  [place].   Full Liquid Diet Start with a full liquid diet and slowly advance to a soft diet over the next 24-48 hours A full liquid diet refers to fluids and foods that are liquid or will become liquid at room temperature. This diet should only be used for a short period of time to help you recover from illness or surgery. Your health care provider or dietitian will help you determine when it is safe to eat regular foods. What are tips for following this plan?     Reading food labels  Check food labels of nutrition shakes for the amount of protein. Look for nutrition shakes that have at least 8-10 grams of protein  in each serving.  Look for drinks, such as milks and juices, that are "fortified" or "enriched." This means that vitamins and minerals have been added. Shopping  Buy premade nutritional shakes to keep on hand.  To vary your choices, buy different flavors of milks and shakes. Meal planning  Choose flavors and foods that you enjoy.  To make sure you get enough energy from food (calories): ? Eat 3 full liquid meals each day. Have a liquid snack between each meal. ? Drink 6-8 ounces (177-237 ml) of a nutrition supplement shake with meals or as snacks. ? Add protein powder, powdered milk, milk, or yogurt to shakes to increase the amount of protein.  Drink at least one serving a day of citrus fruit juice or fruit juice that has vitamin C added. General guidelines  Before starting  the full-liquid diet, check with your health care provider to know what foods you should avoid. These may include full-fat or high-fiber liquids.  You may have any liquid or food that becomes a liquid at room temperature. The food is considered a liquid if it can be poured off a spoon at room temperature.  Do not drink alcohol unless approved by your health care provider.  This diet gives you most of the nutrients that you need for energy, but you may not get enough of certain vitamins, minerals, and fiber. Make sure to talk to your health care provider or dietitian about: ? How many calories you need to eat get day. ? How much fluid you should have each day. ? Taking a multivitamin or a nutritional supplement. What foods are allowed? The items listed may not be a complete list. Talk with your dietitian about what dietary choices are best for you. Grains Thin hot cereal, such as cream of wheat. Soft-cooked pasta or rice pured in soup. Vegetables Pulp-free tomato or vegetable juice. Vegetables pured in soup. Fruits Fruit juice without pulp. Strained fruit pures (seeds and skins removed). Meats and other protein foods Beef, chicken, and fish broths. Powdered protein supplements. Dairy Milk and milk-based beverages, including milk shakes and instant breakfast mixes. Smooth yogurt. Pured cottage cheese. Beverages Water. Coffee and tea (caffeinated or decaffeinated). Cocoa. Liquid nutritional supplements. Soft drinks. Nondairy milks, such as almond, coconut, rice, or soy milk. Fats and oils Melted margarine and butter. Cream. Canola, almond, avocado, corn, grapeseed, sunflower, and sesame oils. Gravy. Sweets and desserts Custard. Pudding. Flavored gelatin. Smooth ice cream (without nuts or candy pieces). Sherbet. Popsicles. [place] ice. Pudding pops. Seasoning and other foods Salt and pepper. Spices. Cocoa powder. Vinegar. Ketchup. Yellow mustard. Smooth sauces, such as [place],  cheese sauce, or white sauce. Soy sauce. Cream soups. Strained soups. Syrup. Honey. Jelly (without fruit pieces). What foods are not allowed? The items listed may not be a complete list. Talk with your dietitian about what dietary choices are best for you. Grains Whole grains. Pasta. Rice. Cold cereal. Bread. Crackers. Vegetables All whole fresh, frozen, or canned vegetables. Fruits All whole fresh, frozen, or canned fruits. Meats and other protein foods All cuts of meat, poultry, and fish. Eggs. Tofu and soy protein. Nuts and nut butters. Lunch meat. Sausage. Dairy Hard cheese. Yogurt with fruit chunks. Fats and oils Coconut oil. Palm oil. Lard. Cold butter. Sweets and desserts Ice cream or other frozen desserts that have any solids in them or on top, such as nuts, chocolate chips, and pieces of cookies. Cakes. Cookies. Candy. Seasoning and other foods Stone-ground mustards. Soups with chunks or  pieces. Summary  A full liquid diet refers to fluids and foods that are liquid or will become liquid at room temperature.  This diet should only be used for a short period of time to help you recover from illness or surgery. Ask your health care provider or dietitian when it is safe for you to eat regular foods.  To make sure you get enough calories and nutrients, eat 3 meals each day with snacks between. Drink premade nutrition supplement shakes or add protein powder to homemade shakes. Take a vitamin and mineral supplement as told by your health care provider. This information is not intended to replace advice given to you by your health care provider. Make sure you discuss any questions you have with your health care provider. Document Revised: 04/01/2017 Document Reviewed: 02/17/2016 Elsevier Patient Education  [place] Once advanced to a soft diet, please follow for the next 2 weeks before returning to your normal eating habits  A soft-food eating plan  includes foods that are safe and easy to chew and swallow. Your health care provider or dietitian can help you find foods and flavors that fit into this plan. Follow this plan until your health care provider or dietitian says it is safe to start eating other foods and food textures. What are tips for following this plan? General guidelines   Take small bites of food, or cut food into pieces about  inch or smaller. Bite-sized pieces of food are easier to chew and swallow.  Eat moist foods. Avoid overly dry foods.  Avoid foods that: ? Are difficult to swallow, such as dry, chunky, crispy, or sticky foods. ? Are difficult to chew, such as hard, tough, or stringy foods. ? Contain nuts, seeds, or fruits.  Follow instructions from your dietitian about the types of liquids that are safe for you to swallow. You may be allowed to have: ? Thick liquids only. This includes only liquids that are thicker than honey. ? Thin and thick liquids. This includes all beverages and foods that become liquid at room temperature.  To make thick liquids: ? Purchase a commercial liquid thickening powder. These are available at grocery stores and pharmacies. ? Mix the thickener into liquids according to instructions on the label. ? Purchase ready-made thickened liquids. ? Thicken soup by pureeing, straining to remove chunks, and adding flour, potato flakes, or corn starch. ? Add commercial thickener to foods that become liquid at room temperature, such as milk shakes, yogurt, ice cream, gelatin, and sherbet.  Ask your health care provider whether you need to take a fiber supplement. Cooking  Cook meats so they stay tender and moist. Use methods like braising, stewing, or baking in liquid.  Cook vegetables and fruit until they are soft enough to be mashed with a fork.  Peel soft, fresh fruits such as peaches, nectarines, and melons.  When making soup, make sure chunks of meat and vegetables are smaller than   inch.  Reheat leftover foods slowly so that a tough crust does not form. What foods are allowed? The items listed below may not be a complete list. Talk with your dietitian about what dietary choices are best for you. Grains Breads, muffins, pancakes, or waffles moistened with syrup, jelly, or butter. Dry cereals well-moistened with milk. Moist, cooked cereals. Well-cooked pasta and rice. Vegetables All soft-cooked vegetables. Shredded lettuce. Fruits All canned and cooked fruits. Soft, peeled fresh fruits. Strawberries. Dairy Milk. Cream. Yogurt. Cottage cheese. Soft  cheese without the rind. Meats and other protein foods Tender, moist ground meat, poultry, or fish. Meat cooked in gravy or sauces. Eggs. Sweets and desserts Ice cream. Milk shakes. Sherbet. Pudding. Fats and oils Butter. Margarine. Olive, canola, sunflower, and grapeseed oil. Smooth salad dressing. Smooth cream cheese. Mayonnaise. Gravy. What foods are not allowed? The items listed bemay not be a complete list. Talk with your dietitian about what dietary choices are best for you. Grains Coarse or dry cereals, such as bran, granola, and shredded wheat. Tough or chewy crusty breads, such as [place] bread or baguettes. Breads with nuts, seeds, or fruit. Vegetables All raw vegetables. Cooked corn. Cooked vegetables that are tough or stringy. Tough, crisp, fried potatoes and potato skins. Fruits Fresh fruits with skins or seeds, or both, such as apples, pears, and grapes. Stringy, high-pulp fruits, such as papaya, pineapple, coconut, and mango. Fruit leather and all dried fruit. Dairy Yogurt with nuts or coconut. Meats and other protein foods Hard, dry sausages. Dry meat, poultry, or fish. Meats with gristle. Fish with bones. Fried meat or fish. Lunch meat and hotdogs. Nuts and seeds. Chunky peanut butter or other nut butters. Sweets and desserts Cakes or cookies that are very dry or chewy. Desserts with dried fruit, nuts, or  coconut. Fried pastries. Very rich pastries. Fats and oils Cream cheese with fruit or nuts. Salad dressings with seeds or chunks. Summary  A soft-food eating plan includes foods that are safe and easy to swallow. Generally, the foods should be soft enough to be mashed with a fork.  Avoid foods that are dry, hard to chew, crunchy, sticky, stringy, or crispy.  Ask your health care provider whether you need to thicken your liquids and if you need to take a fiber supplement. This information is not intended to replace advice given to you by your health care provider. Make sure you discuss any questions you have with your health care provider. Document Revised: 04/26/2018 Document Reviewed: 03/08/2016 Elsevier Patient Education  [place].

## 2019-04-10 NOTE — Discharge Summary (Signed)
Physician Discharge Summary  Shelia Chan WUX:[id number] DOB: 02-08-1955 DOA: 04/08/2019  PCP: Shelia Crofts, FNP  Admit date: 04/08/2019 Discharge date: 04/10/2019  Time spent: 40 minutes  Recommendations for Outpatient Follow-up:  1. Follow outpatient CBC/CMP 2. Slowly advance diet 3. Follow with surgery and PCP as outpatient   Discharge Diagnoses:  Principal Problem:   Small bowel obstruction (HCC) Active Problems:   CKD (chronic kidney disease), stage IV (HCC)   Discharge Condition: stable  Diet recommendation: full/soft, slowly advance as tolerated  Filed Weights   04/08/19 1457  Weight: 57.6 kg    History of present illness:  Shelia Chan 64 y.o.femalewith medical history significant forCOPD, psychosis, bladder cancer status post cystectomy with ileal conduit, CKD 4, and CHF, now presenting to the emergency department for evaluation of abdominal pain with nausea, vomiting, and diarrhea. Patient reports that she developed generalized abdominal pain approximately 3 days ago, is also had nausea with nonbloody vomiting and watery stool since then. Symptoms have continued and are reminiscent of an SBO that she had previously which required bowel resection. She denies any fevers, chills, cough, chest pain, or shortness of breath. She denies melena or hematochezia.  Patient reports being cancer free since her surgery, recently moved to the area from [place], has established with [facility] local urologist and oncologist but has not yet been to her appointments, and was hospitalized in ageri-psych unit last month with paranoia that has resolved with some medication changes. Patient reports that her [facility] was [facility] [place] [facility] [place]  She was admitted with findings consistent with SBO.  S/p conservative management with NG tube and small bowel follow through.  She's improved and on the day of discharge was  tolerating full liquids.  Discharged with plans for outpatient follow up.  Hospital Course:  SBO      - Presents with 3 days of progressive abdominal pain and N/V/D, and has CT findings concerning for recurrent SBO      - NGT removed     - tolerated clear liquid diet yesterday, advanced to full/soft today and tolerated this well      - appreciate surgery recommendations  CKD IV      - SCr appears stable on admission      - Renally-dose medications, monitor     - follow outpatient - she notes plans to follow with nephrology  History of bladder cancer      - Patient [place], was told that she is cancer-free and has established with local [facility]rologist at [facility] for mgmt of her ureteral stents; maintain outpt follow up  History of psychosis      - She was hospitalized last month with paranoia, reports that Abilify was increased, trazodone added, and paranoia resolved      - Resume medications as soon as her condition allows, monitor     - 3/23: mentation is stable this AM    COPD      - Patient denies recent cough or wheeze, has quit smoking and rarely needs inhaler      - Continue albuterol as needed     - 3/23: denies complaints this AM   Procedures:  none   Consultations:  surgery  Discharge Exam: Vitals:   04/10/19 1106 04/10/19 1453  BP:  108/67  Pulse: 62 67  Resp:  17  Temp:  98.8 F (37.1 C)  SpO2: 100% 94%  Feels well Did fine with breakfast and lunch.  Had good BM.  General: No acute distress. Cardiovascular: Heart sounds show [facility] regular rate, and rhythm. Lungs: Clear to auscultation bilaterally Abdomen: Soft, nontender, nondistended.  urostomy. Neurological: Alert and oriented 3. Moves all extremities 4. Cranial nerves II through XII grossly intact. Skin: Warm and dry. No rashes or lesions. Extremities: No clubbing or cyanosis. No edema.    Discharge Instructions   Discharge Instructions    Call MD  for:  difficulty breathing, headache or visual disturbances   Complete by: As directed    Call MD for:  extreme fatigue   Complete by: As directed    Call MD for:  hives   Complete by: As directed    Call MD for:  persistant dizziness or light-headedness   Complete by: As directed    Call MD for:  persistant nausea and vomiting   Complete by: As directed    Call MD for:  redness, tenderness, or signs of infection (pain, swelling, redness, odor or green/yellow discharge around incision site)   Complete by: As directed    Call MD for:  severe uncontrolled pain   Complete by: As directed    Call MD for:  temperature >100.4   Complete by: As directed    Diet - low sodium heart healthy   Complete by: As directed    Discharge instructions   Complete by: As directed    You were seen for [facility] small bowel obstruction.  You've improved with conservative management.  Slowly advance your diet at discharge.  You should continue on full liquids/soft diet at discharge.  You can gradually normalize this as you tolerate.  Return for new, recurrent, or worsening symptoms.  Please ask your PCP to request records from this hospitalization so they know what was done and what the next steps will be.   Increase activity slowly   Complete by: As directed      Allergies as of 04/10/2019   No Known Allergies     Medication List    TAKE these medications   acetaminophen 500 MG tablet Commonly known as: TYLENOL Take 500-1,000 mg by mouth every 6 (six) hours as needed for mild pain or headache.   ARIPiprazole 30 MG tablet Commonly known as: ABILIFY Take 30 mg by mouth daily.   aspirin EC 81 MG tablet Take 81 mg by mouth daily.   atorvastatin 10 MG tablet Commonly known as: LIPITOR Take 10 mg by mouth at bedtime.   calcitRIOL 0.25 MCG capsule Commonly known as: ROCALTROL Take 0.25 mcg by mouth daily.   hydrOXYzine 50 MG capsule Commonly known as: VISTARIL Take 50 mg by mouth daily as needed  for anxiety.   metoprolol succinate 25 MG 24 hr tablet Commonly known as: TOPROL-XL Take 25 mg by mouth daily.   MULTIVITAMIN ADULT PO Take 1 tablet by mouth daily.   pantoprazole 40 MG tablet Commonly known as: PROTONIX Take 40 mg by mouth daily before breakfast.   traZODone 100 MG tablet Commonly known as: DESYREL Take 100 mg by mouth at bedtime.      No Known Allergies Follow-up Information    Shelia Crofts, FNP. Schedule an appointment as soon as possible for Ula Couvillon visit.   Specialty: Family Medicine Contact information: [facility] [address] [place] [address] [phone]            The results of significant diagnostics from this hospitalization (including imaging, microbiology, ancillary and  laboratory) are listed below for reference.    Significant Diagnostic Studies: CT Abdomen Pelvis Wo Contrast  Result Date: 04/08/2019 CLINICAL DATA:  Abdominal pain EXAM: CT ABDOMEN AND PELVIS WITHOUT CONTRAST TECHNIQUE: Multidetector CT imaging of the abdomen and pelvis was performed following the standard protocol without IV contrast. COMPARISON:  None. FINDINGS: Lower chest: Lung bases are clear. No effusions. Heart is normal size. Hepatobiliary: No focal hepatic abnormality. Gallbladder unremarkable. Pancreas: No focal abnormality or ductal dilatation. Spleen: No focal abnormality.  Normal size. 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 Left kidney is atrophic with cortical thinning. Left hydronephrosis. Ureteral stents are in place. The proximal aspect of the left ureteral stent is in the proximal to mid left ureter. These extend into the urostomy bag. No adrenal mass. Stomach/Bowel: There are dilated small bowel loops in the abdomen and pelvis. Postoperative changes within small bowel loops in the pelvis. Exact transition is not visualized, but appearance is compatible with small bowel obstruction. Small bowel loops in the pelvis  near the operative level are dilated and fecalized. Vascular/Lymphatic: Aortic atherosclerosis. No enlarged abdominal or pelvic lymph nodes. Reproductive: Prior hysterectomy.  No adnexal masses. Other: No free fluid or free air. Musculoskeletal: No acute bony abnormality. IMPRESSION: Prior cystectomy. Ileal conduit with right lower quadrant urostomy. There is left hydronephrosis with left renal cortical thinning and left renal atrophy suggesting Braylen Staller longstanding process. Ureteral stents are in place. The left ureteral stent proximal aspect is in the mid left ureter. Dilated small bowel loops into the pelvis where there are fecalized small bowel loops near the surgical suture line. Findings compatible with small bowel obstruction. Exact transition not visualized. Aortic atherosclerosis. Electronically Signed   By: Charlett Nose M.D.   On: 04/08/2019 21:40   DG Abd Portable 1V-Small Bowel Obstruction Protocol-initial, 8 hr delay  Result Date: 04/09/2019 CLINICAL DATA:  Follow up small bowel obstruction EXAM: PORTABLE ABDOMEN - 1 VIEW COMPARISON:  04/08/2019 FINDINGS: Scattered large and small bowel gas is noted. Previously administered contrast has passed through the small-bowel and now lies within the colon. Jhace Fennell small amount of residual contrast is noted in the distal small bowel. Persistent small bowel dilatation is noted consistent with partial small bowel obstruction. The gastric catheter is been removed in the interval. No free air is noted. IMPRESSION: Continuing changes of partial small bowel obstruction as described. Electronically Signed   By: Alcide Clever M.D.   On: 04/09/2019 08:58   DG Abd Portable 1V-Small Bowel Protocol-Position Verification  Result Date: 04/08/2019 CLINICAL DATA:  64 year old female enteric tube placement. EXAM: PORTABLE ABDOMEN - 1 VIEW COMPARISON:  CT Abdomen and Pelvis earlier tonight. FINDINGS: Portable AP upright view at 2345 hours. Bilateral ureteral stents redemonstrated in  association with an ileal conduit. Enteric tube has been placed into the stomach with side hole at the level of the gastric body. Negative lung bases. Stable bowel gas pattern. No acute osseous abnormality identified. IMPRESSION: Enteric tube placed, side hole at the level of the gastric body. Electronically Signed   By: Odessa Fleming M.D.   On: 04/08/2019 23:53    Microbiology: Recent Results (from the past 240 hour(s))  Respiratory Panel by RT PCR (Flu Alastor Kneale&B, Covid) - Nasopharyngeal Swab     Status: None   Collection Time: 04/08/19 10:02 PM   Specimen: Nasopharyngeal Swab  Result Value Ref Range Status   SARS Coronavirus 2 by RT PCR NEGATIVE NEGATIVE Final    Comment: (NOTE) SARS-CoV-2 target nucleic acids  are NOT DETECTED. The SARS-CoV-2 RNA is generally detectable in upper respiratoy specimens during the acute phase of infection. The lowest concentration of SARS-CoV-2 viral copies this assay can detect is 131 copies/mL. Lovie Agresta negative result does not preclude SARS-Cov-2 infection and should not be used as the sole basis for treatment or other patient management decisions. Atlas Kuc negative result may occur with  improper specimen collection/handling, submission of specimen other than nasopharyngeal swab, presence of viral mutation(s) within the areas targeted by this assay, and inadequate number of viral copies (<131 copies/mL). Michae Grimley negative result must be combined with clinical observations, patient history, and epidemiological information. The expected result is Negative. Fact Sheet for Patients:  https://www.moore.com/ Fact Sheet for Healthcare Providers:  https://www.young.biz/ This test is not yet ap proved or cleared by the [place] FDA and  has been authorized for detection and/or diagnosis of SARS-CoV-2 by FDA under an Emergency Use Authorization (EUA). This EUA will remain  in effect (meaning this test can be used) for the duration of the COVID-19  declaration under Section 564(b)(1) of the Act, 21 U.S.C. section 360bbb-3(b)(1), unless the authorization is terminated or revoked sooner.    Influenza Jaloni Davoli by PCR NEGATIVE NEGATIVE Final   Influenza B by PCR NEGATIVE NEGATIVE Final    Comment: (NOTE) The Xpert Xpress SARS-CoV-2/FLU/RSV assay is intended as an aid in  the diagnosis of influenza from Nasopharyngeal swab specimens and  should not be used as Brando Taves sole basis for treatment. Nasal washings and  aspirates are unacceptable for Xpert Xpress SARS-CoV-2/FLU/RSV  testing. Fact Sheet for Patients: https://www.moore.com/ Fact Sheet for Healthcare Providers: https://www.young.biz/ This test is not yet approved or cleared by the [place] FDA and  has been authorized for detection and/or diagnosis of SARS-CoV-2 by  FDA under an Emergency Use Authorization (EUA). This EUA will remain  in effect (meaning this test can be used) for the duration of the  Covid-19 declaration under Section 564(b)(1) of the Act, 21  U.S.C. section 360bbb-3(b)(1), unless the authorization is  terminated or revoked. Performed at [facility], [address] [place], [place] [address]      Labs: Basic Metabolic Panel: Recent Labs  Lab 04/08/19 1607 04/09/19 0409 04/10/19 0602  NA 137 141 137  K 3.8 3.6 3.4*  CL 101 104 105  CO2 20* 23 23  GLUCOSE 149* 120* 96  BUN 25* 29* 20  CREATININE 2.35* 2.36* 2.03*  CALCIUM 9.8 9.1 8.3*  MG  --   --  1.6*   Liver Function Tests: Recent Labs  Lab 04/08/19 1607 04/10/19 0602  AST 18 13*  ALT 13 11  ALKPHOS 92 74  BILITOT 0.9 0.7  PROT 8.1 6.5  ALBUMIN 3.7 2.9*   Recent Labs  Lab 04/08/19 1607  LIPASE 28   No results for input(s): AMMONIA in the last 168 hours. CBC: Recent Labs  Lab 04/08/19 1607 04/09/19 0409 04/10/19 0602  WBC 9.7 8.6 4.7  NEUTROABS  --  6.7 2.8  HGB 11.8* 10.7* 9.5*  HCT 36.6 32.5* 30.0*  MCV 87.1 86.4 90.1  PLT 615*  524* 409*   Cardiac Enzymes: No results for input(s): CKTOTAL, CKMB, CKMBINDEX, TROPONINI in the last 168 hours. BNP: BNP (last 3 results) No results for input(s): BNP in the last 8760 hours.  ProBNP (last 3 results) No results for input(s): PROBNP in the last 8760 hours.  CBG: No results for input(s): GLUCAP in the last 168 hours.     Signed:  Lacretia Nicks MD.  [facility] 04/10/2019, 5:16 PM

## 2019-04-10 NOTE — Plan of Care (Signed)
  Problem: Education: Goal: Knowledge of General Education information will improve Description: Including pain rating scale, medication(s)/side effects and non-pharmacologic comfort measures 04/10/2019 1524 by Stevan Born, RN Outcome: Completed/Met 04/10/2019 1401 by Stevan Born, RN Outcome: Progressing   Problem: Health Behavior/Discharge Planning: Goal: Ability to manage health-related needs will improve 04/10/2019 1524 by Stevan Born, RN Outcome: Completed/Met 04/10/2019 1401 by Stevan Born, RN Outcome: Progressing   Problem: Clinical Measurements: Goal: Ability to maintain clinical measurements within normal limits will improve Outcome: Completed/Met Goal: Will remain free from infection 04/10/2019 1524 by Stevan Born, RN Outcome: Completed/Met 04/10/2019 1401 by Stevan Born, RN Outcome: Not Progressing Goal: Diagnostic test results will improve Outcome: Completed/Met Goal: Respiratory complications will improve Outcome: Completed/Met Goal: Cardiovascular complication will be avoided Outcome: Completed/Met   Problem: Activity: Goal: Risk for activity intolerance will decrease Outcome: Completed/Met   Problem: Nutrition: Goal: Adequate nutrition will be maintained 04/10/2019 1524 by Stevan Born, RN Outcome: Completed/Met 04/10/2019 1401 by Stevan Born, RN Outcome: Progressing   Problem: Coping: Goal: Level of anxiety will decrease 04/10/2019 1524 by Stevan Born, RN Outcome: Completed/Met 04/10/2019 1401 by Stevan Born, RN Outcome: Progressing   Problem: Elimination: Goal: Will not experience complications related to bowel motility 04/10/2019 1524 by Stevan Born, RN Outcome: Completed/Met 04/10/2019 1401 by Stevan Born, RN Outcome: Progressing Goal: Will not experience complications related to urinary retention Outcome: Completed/Met   Problem: Pain Managment: Goal: General experience of comfort will  improve 04/10/2019 1524 by Stevan Born, RN Outcome: Completed/Met 04/10/2019 1401 by Stevan Born, RN Outcome: Progressing   Problem: Safety: Goal: Ability to remain free from injury will improve 04/10/2019 1524 by Stevan Born, RN Outcome: Completed/Met 04/10/2019 1401 by Stevan Born, RN Outcome: Progressing   Problem: Skin Integrity: Goal: Risk for impaired skin integrity will decrease 04/10/2019 1524 by Stevan Born, RN Outcome: Completed/Met 04/10/2019 1401 by Stevan Born, RN Outcome: Progressing

## 2019-04-10 NOTE — Progress Notes (Signed)
Subjective/Chief Complaint: Small emesis last night, no pain, having a lot of loose stools   Objective: Vital signs in last 24 hours: Temp:  [97.6 F (36.4 C)-99.3 F (37.4 C)] 98.9 F (37.2 C) (03/24 0729) Pulse Rate:  [56-65] 65 (03/24 0729) Resp:  [15-20] 15 (03/24 0729) BP: (110-136)/(61-73) 110/61 (03/24 0729) SpO2:  [95 %-100 %] 95 % (03/24 0729) Last BM Date: 04/09/19  Intake/Output from previous day: 03/23 0701 - 03/24 0700 In: 120 [P.O.:120] Out: 801 [Urine:701; Emesis/NG output:100] Intake/Output this shift: No intake/output data recorded.  GI: urostomy functional, soft, nontender, bs present  Lab Results:  Recent Labs    04/09/19 0409 04/10/19 0602  WBC 8.6 4.7  HGB 10.7* 9.5*  HCT 32.5* 30.0*  PLT 524* 409*   BMET Recent Labs    04/09/19 0409 04/10/19 0602  NA 141 137  K 3.6 3.4*  CL 104 105  CO2 23 23  GLUCOSE 120* 96  BUN 29* 20  CREATININE 2.36* 2.03*  CALCIUM 9.1 8.3*   PT/INR No results for input(s): LABPROT, INR in the last 72 hours. ABG No results for input(s): PHART, HCO3 in the last 72 hours.  Invalid input(s): PCO2, PO2  Studies/Results: CT Abdomen Pelvis Wo Contrast  Result Date: 04/08/2019 CLINICAL DATA:  Abdominal pain EXAM: CT ABDOMEN AND PELVIS WITHOUT CONTRAST TECHNIQUE: Multidetector CT imaging of the abdomen and pelvis was performed following the standard protocol without IV contrast. COMPARISON:  None. FINDINGS: Lower chest: Lung bases are clear. No effusions. Heart is normal size. Hepatobiliary: No focal hepatic abnormality. Gallbladder unremarkable. Pancreas: No focal abnormality or ductal dilatation. Spleen: No focal abnormality.  Normal size. 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 Left kidney is atrophic with cortical thinning. Left hydronephrosis. Ureteral stents are in place. The proximal aspect of the left ureteral stent is in the proximal to mid left ureter. These extend into the  urostomy bag. No adrenal mass. Stomach/Bowel: There are dilated small bowel loops in the abdomen and pelvis. Postoperative changes within small bowel loops in the pelvis. Exact transition is not visualized, but appearance is compatible with small bowel obstruction. Small bowel loops in the pelvis near the operative level are dilated and fecalized. Vascular/Lymphatic: Aortic atherosclerosis. No enlarged abdominal or pelvic lymph nodes. Reproductive: Prior hysterectomy.  No adnexal masses. Other: No free fluid or free air. Musculoskeletal: No acute bony abnormality. IMPRESSION: Prior cystectomy. Ileal conduit with right lower quadrant urostomy. There is left hydronephrosis with left renal cortical thinning and left renal atrophy suggesting a longstanding process. Ureteral stents are in place. The left ureteral stent proximal aspect is in the mid left ureter. Dilated small bowel loops into the pelvis where there are fecalized small bowel loops near the surgical suture line. Findings compatible with small bowel obstruction. Exact transition not visualized. Aortic atherosclerosis. Electronically Signed   By: Rolm Baptise M.D.   On: 04/08/2019 21:40   DG Abd Portable 1V-Small Bowel Obstruction Protocol-initial, 8 hr delay  Result Date: 04/09/2019 CLINICAL DATA:  Follow up small bowel obstruction EXAM: PORTABLE ABDOMEN - 1 VIEW COMPARISON:  04/08/2019 FINDINGS: Scattered large and small bowel gas is noted. Previously administered contrast has passed through the small-bowel and now lies within the colon. A small amount of residual contrast is noted in the distal small bowel. Persistent small bowel dilatation is noted consistent with partial small bowel obstruction. The gastric catheter is been removed in the interval. No free air is noted. IMPRESSION: Continuing changes of partial  small bowel obstruction as described. Electronically Signed   By: Inez Catalina M.D.   On: 04/09/2019 08:58   DG Abd Portable 1V-Small Bowel  Protocol-Position Verification  Result Date: 04/08/2019 CLINICAL DATA:  64 year old female enteric tube placement. EXAM: PORTABLE ABDOMEN - 1 VIEW COMPARISON:  CT Abdomen and Pelvis earlier tonight. FINDINGS: Portable AP upright view at 2345 hours. Bilateral ureteral stents redemonstrated in association with an ileal conduit. Enteric tube has been placed into the stomach with side hole at the level of the gastric body. Negative lung bases. Stable bowel gas pattern. No acute osseous abnormality identified. IMPRESSION: Enteric tube placed, side hole at the level of the gastric body. Electronically Signed   By: Genevie Ann M.D.   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

## 2019-04-12 ENCOUNTER — Other Ambulatory Visit: Payer: Self-pay | Admitting: Radiology

## 2019-04-15 ENCOUNTER — Encounter (HOSPITAL_COMMUNITY): Payer: Self-pay

## 2019-04-15 ENCOUNTER — Ambulatory Visit (HOSPITAL_COMMUNITY)
Admission: RE | Admit: 2019-04-15 | Discharge: 2019-04-15 | Disposition: A | Discharge status: Home / Self Care | Payer: Medicare HMO | Source: Ambulatory Visit | Attending: Urology | Admitting: Urology

## 2019-04-15 ENCOUNTER — Other Ambulatory Visit (HOSPITAL_COMMUNITY): Payer: Self-pay | Admitting: Interventional Radiology

## 2019-04-15 ENCOUNTER — Other Ambulatory Visit (HOSPITAL_COMMUNITY): Payer: Self-pay | Admitting: Urology

## 2019-04-15 ENCOUNTER — Other Ambulatory Visit: Payer: Self-pay

## 2019-04-15 DIAGNOSIS — Z79899 Other long term (current) drug therapy: Secondary | ICD-10-CM | POA: Diagnosis not present

## 2019-04-15 DIAGNOSIS — N1330 Unspecified hydronephrosis: Secondary | ICD-10-CM

## 2019-04-15 DIAGNOSIS — I700 Atherosclerosis of aorta: Secondary | ICD-10-CM | POA: Insufficient documentation

## 2019-04-15 DIAGNOSIS — D849 Immunodeficiency, unspecified: Secondary | ICD-10-CM | POA: Insufficient documentation

## 2019-04-15 DIAGNOSIS — Z8551 Personal history of malignant neoplasm of bladder: Secondary | ICD-10-CM | POA: Diagnosis not present

## 2019-04-15 DIAGNOSIS — Z87891 Personal history of nicotine dependence: Secondary | ICD-10-CM | POA: Diagnosis not present

## 2019-04-15 DIAGNOSIS — I509 Heart failure, unspecified: Secondary | ICD-10-CM | POA: Diagnosis not present

## 2019-04-15 DIAGNOSIS — N1339 Other hydronephrosis: Secondary | ICD-10-CM | POA: Diagnosis not present

## 2019-04-15 DIAGNOSIS — J449 Chronic obstructive pulmonary disease, unspecified: Secondary | ICD-10-CM | POA: Insufficient documentation

## 2019-04-15 DIAGNOSIS — Z7982 Long term (current) use of aspirin: Secondary | ICD-10-CM | POA: Diagnosis not present

## 2019-04-15 HISTORY — PX: IR CATHETER TUBE CHANGE: IMG717

## 2019-04-15 MED ORDER — IOHEXOL 300 MG/ML  SOLN
50.0000 mL | Freq: Once | INTRAMUSCULAR | Status: AC | PRN
  Administered 2019-04-15: 20 mL

## 2019-04-15 MED ORDER — SODIUM CHLORIDE 0.9 % IV SOLN: INTRAVENOUS | Status: DC

## 2019-04-15 MED ORDER — LIDOCAINE VISCOUS HCL 2 % MT SOLN
OROMUCOSAL | Status: AC
  Filled 2019-04-15: qty 15

## 2019-04-15 MED ORDER — CEFAZOLIN SODIUM-DEXTROSE 2-4 GM/100ML-% IV SOLN: 2.0000 g | INTRAVENOUS | Status: DC

## 2019-04-15 NOTE — Discharge Instructions (Signed)
No changes to your home medications    Ureteral Stent Implantation, Care After This sheet gives you information about how to care for yourself after your procedure. Your health care provider may also give you more specific instructions. If you have problems or questions, contact your health care provider. What can I expect after the procedure? After the procedure, it is common to have:  Nausea.  Mild pain when you urinate. You may feel this pain in your lower back or lower abdomen. The pain should stop within a few minutes after you urinate. This may last for up to 1 week.  A small amount of blood in your urine for several days. Follow these instructions at home: Medicines  Take over-the-counter and prescription medicines only as told by your health care provider.  If you were prescribed an antibiotic medicine, take it as told by your health care provider. Do not stop taking the antibiotic even if you start to feel better.  Do not drive for 24 hours if you were given a sedative during your procedure.  Ask your health care provider if the medicine prescribed to you requires you to avoid driving or using heavy machinery. Activity  Rest as told by your health care provider.  Avoid sitting for a long time without moving. Get up to take short walks every 1-2 hours. This is important to improve blood flow and breathing. Ask for help if you feel weak or unsteady.  Return to your normal activities as told by your health care provider. Ask your health care provider what activities are safe for you. General instructions   Watch for any blood in your urine. Call your health care provider if the amount of blood in your urine increases.  If you have a catheter: ? Follow instructions from your health care provider about taking care of your catheter and collection bag. ? Do not take baths, swim, or use a hot tub until your health care provider approves. Ask your health care provider if you may  take showers. You may only be allowed to take sponge baths.  Drink enough fluid to keep your urine pale yellow.  Do not use any products that contain nicotine or tobacco, such as cigarettes, e-cigarettes, and chewing tobacco. These can delay healing after surgery. If you need help quitting, ask your health care provider.  Keep all follow-up visits as told by your health care provider. This is important. Contact a health care provider if:  You have pain that gets worse or does not get better with medicine, especially pain when you urinate.  You have difficulty urinating.  You feel nauseous or you vomit repeatedly during a period of more than 2 days after the procedure. Get help right away if:  Your urine is dark red or has blood clots in it.  You are leaking urine (have incontinence).  The end of the stent comes out of your urethra.  You cannot urinate.  You have sudden, sharp, or severe pain in your abdomen or lower back.  You have a fever.  You have swelling or pain in your legs.  You have difficulty breathing. Summary  After the procedure, it is common to have mild pain when you urinate that goes away within a few minutes after you urinate. This may last for up to 1 week.  Watch for any blood in your urine. Call your health care provider if the amount of blood in your urine increases.  Take over-the-counter and prescription medicines only  as told by your health care provider.  Drink enough fluid to keep your urine pale yellow. This information is not intended to replace advice given to you by your health care provider. Make sure you discuss any questions you have with your health care provider. Document Revised: 10/10/2017 Document Reviewed: 10/11/2017 Elsevier Patient Education  2020 [employer].

## 2019-04-15 NOTE — Progress Notes (Signed)
Patient returns to Short Stay room 5 after bilateral urology stent exchanges. No sedation given during procedure. No antibiotic given.She is pain free and alert and oriented.

## 2019-04-15 NOTE — Consult Note (Signed)
Chief Complaint: Patient was seen in consultation today for bilateral ureteral stent exchanges  Referring Physician(s): Princeton D  Supervising Physician: Jacqulynn Cadet  Patient Status: [facility] - Out-pt  History of Present Illness: Shelia Chan is a 64 y.o. female with history of asthma, CHF, COPD, and bladder cancer with prior cystectomy with ileal conduit creation in 2015.  Recent CT abdomen pelvis on 3/22 revealed: Prior cystectomy. Ileal conduit with right lower quadrant urostomy. There is left hydronephrosis with left renal cortical thinning and left renal atrophy suggesting a longstanding process. Ureteral stents are in place. The left ureteral stent proximal aspect is in the mid left ureter.  Dilated small bowel loops into the pelvis where there are fecalized small bowel loops near the surgical suture line. Findings compatible with small bowel obstruction. Exact transition not visualized.  Aortic atherosclerosis.   She presents today for bilateral ureteral stent exchanges via the ileal conduit.  Past Medical History:  Diagnosis Date  . Arthritis   . Asthma   . Blood transfusion without reported diagnosis   . Cancer ([place])   . CHF (congestive heart failure) ([place])   . COPD (chronic obstructive pulmonary disease) ([place])   . Immune deficiency disorder ([place])   . Renal disorder     Past Surgical History:  Procedure Laterality Date  . BLADDER REMOVAL  118/22/2015  . FRACTURE SURGERY Left 02/2018   pins    Allergies: Patient has no known allergies.  Medications: Prior to Admission medications   Medication Sig Start Date End Date Taking? Authorizing Provider  acetaminophen (TYLENOL) 500 MG tablet Take 500-1,000 mg by mouth every 6 (six) hours as needed for mild pain or headache.    [provider]  ARIPiprazole (ABILIFY) 30 MG tablet Take 30 mg by mouth daily. 03/19/19   [provider]  aspirin EC 81 MG tablet Take 81 mg by mouth  daily.    [provider]  atorvastatin (LIPITOR) 10 MG tablet Take 10 mg by mouth at bedtime.  02/18/19   [provider]  calcitRIOL (ROCALTROL) 0.25 MCG capsule Take 0.25 mcg by mouth daily. 02/18/19   [provider]  hydrOXYzine (VISTARIL) 50 MG capsule Take 50 mg by mouth daily as needed for anxiety. 03/19/19   [provider]  metoprolol succinate (TOPROL-XL) 25 MG 24 hr tablet Take 25 mg by mouth daily. 02/18/19   [provider]  Multiple Vitamin (MULTIVITAMIN ADULT PO) Take 1 tablet by mouth daily.    [provider]  pantoprazole (PROTONIX) 40 MG tablet Take 40 mg by mouth daily before breakfast.  02/18/19   [provider]  traZODone (DESYREL) 100 MG tablet Take 100 mg by mouth at bedtime.  03/19/19   [provider]     No family history on file.  Social History   Socioeconomic History  . Marital status: Divorced    Spouse name: Not on file  . Number of children: Not on file  . Years of education: Not on file  . Highest education level: Not on file  Occupational History  . Not on file  Tobacco Use  . Smoking status: Former Smoker    Packs/day: 1.00    Years: 50.00    Pack years: 50.00    Types: Cigarettes    Quit date: 01/21/2019    Years since quitting: 0.2  . Smokeless tobacco: Never Used  Substance and Sexual Activity  . Alcohol use: Not Currently  . Drug use: Yes  Types: Marijuana    Comment: occasional   . Sexual activity: Not on file  Other Topics Concern  . Not on file  Social History Narrative  . Not on file   Social Determinants of Health   Financial Resource Strain:   . Difficulty of Paying Living Expenses:   Food Insecurity:   . Worried About Charity fundraiser in the Last Year:   . Arboriculturist in the Last Year:   Transportation Needs:   . Film/video editor (Medical):   Marland Kitchen Lack of Transportation (Non-Medical):   Physical Activity:   . Days of Exercise per Week:   .  Minutes of Exercise per Session:   Stress:   . Feeling of Stress :   Social Connections:   . Frequency of Communication with Friends and Family:   . Frequency of Social Gatherings with Friends and Family:   . Attends Religious Services:   . Active Member of Clubs or Organizations:   . Attends Archivist Meetings:   Marland Kitchen Marital Status:      Review of Systems she denies fever, headache, chest pain, dyspnea, cough, abdominal/back pain, nausea, vomiting or bleeding  Vital Signs: Blood pressure 130/61, heart rate 58, temp 98.3, respirations 16, O2 sat 98% room air   Physical Exam awake, alert. Chest clear to auscultation bilaterally. Heart with slightly bradycardic but regular rhythm. Abdomen soft, positive bowel sounds, nontender, intact rt abd urostomy with yellow urine; LE edema bilat  Imaging: CT Abdomen Pelvis Wo Contrast  Result Date: 04/08/2019 CLINICAL DATA:  Abdominal pain EXAM: CT ABDOMEN AND PELVIS WITHOUT CONTRAST TECHNIQUE: Multidetector CT imaging of the abdomen and pelvis was performed following the standard protocol without IV contrast. COMPARISON:  None. FINDINGS: Lower chest: Lung bases are clear. No effusions. Heart is normal size. Hepatobiliary: No focal hepatic abnormality. Gallbladder unremarkable. Pancreas: No focal abnormality or ductal dilatation. Spleen: No focal abnormality.  Normal size. 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 Left kidney is atrophic with cortical thinning. Left hydronephrosis. Ureteral stents are in place. The proximal aspect of the left ureteral stent is in the proximal to mid left ureter. These extend into the urostomy bag. No adrenal mass. Stomach/Bowel: There are dilated small bowel loops in the abdomen and pelvis. Postoperative changes within small bowel loops in the pelvis. Exact transition is not visualized, but appearance is compatible with small bowel obstruction. Small bowel loops in the pelvis near the  operative level are dilated and fecalized. Vascular/Lymphatic: Aortic atherosclerosis. No enlarged abdominal or pelvic lymph nodes. Reproductive: Prior hysterectomy.  No adnexal masses. Other: No free fluid or free air. Musculoskeletal: No acute bony abnormality. IMPRESSION: Prior cystectomy. Ileal conduit with right lower quadrant urostomy. There is left hydronephrosis with left renal cortical thinning and left renal atrophy suggesting a longstanding process. Ureteral stents are in place. The left ureteral stent proximal aspect is in the mid left ureter. Dilated small bowel loops into the pelvis where there are fecalized small bowel loops near the surgical suture line. Findings compatible with small bowel obstruction. Exact transition not visualized. Aortic atherosclerosis. Electronically Signed   By: Rolm Baptise M.D.   On: 04/08/2019 21:40   DG Abd Portable 1V-Small Bowel Obstruction Protocol-initial, 8 hr delay  Result Date: 04/09/2019 CLINICAL DATA:  Follow up small bowel obstruction EXAM: PORTABLE ABDOMEN - 1 VIEW COMPARISON:  04/08/2019 FINDINGS: Scattered large and small bowel gas is noted. Previously administered contrast has passed through the small-bowel and  now lies within the colon. A small amount of residual contrast is noted in the distal small bowel. Persistent small bowel dilatation is noted consistent with partial small bowel obstruction. The gastric catheter is been removed in the interval. No free air is noted. IMPRESSION: Continuing changes of partial small bowel obstruction as described. Electronically Signed   By: Inez Catalina M.D.   On: 04/09/2019 08:58   DG Abd Portable 1V-Small Bowel Protocol-Position Verification  Result Date: 04/08/2019 CLINICAL DATA:  64 year old female enteric tube placement. EXAM: PORTABLE ABDOMEN - 1 VIEW COMPARISON:  CT Abdomen and Pelvis earlier tonight. FINDINGS: Portable AP upright view at 2345 hours. Bilateral ureteral stents redemonstrated in association  with an ileal conduit. Enteric tube has been placed into the stomach with side hole at the level of the gastric body. Negative lung bases. Stable bowel gas pattern. No acute osseous abnormality identified. IMPRESSION: Enteric tube placed, side hole at the level of the gastric body. Electronically Signed   By: Genevie Ann M.D.   On: 04/08/2019 23:53    Labs:  CBC: Recent Labs    03/11/19 1848 04/08/19 1607 04/09/19 0409 04/10/19 0602  WBC 8.3 9.7 8.6 4.7  HGB 10.6* 11.8* 10.7* 9.5*  HCT 32.8* 36.6 32.5* 30.0*  PLT 459* 615* 524* 409*    COAGS: No results for input(s): INR, APTT in the last 8760 hours.  BMP: Recent Labs    03/11/19 1848 04/08/19 1607 04/09/19 0409 04/10/19 0602  NA 136 137 141 137  K 3.5 3.8 3.6 3.4*  CL 105 101 104 105  CO2 19* 20* 23 23  GLUCOSE 102* 149* 120* 96  BUN 30* 25* 29* 20  CALCIUM 9.0 9.8 9.1 8.3*  CREATININE 2.61* 2.35* 2.36* 2.03*  GFRNONAA 19* 21* 21* 25*  GFRAA 22* 24* 24* 29*    LIVER FUNCTION TESTS: Recent Labs    03/11/19 1848 04/08/19 1607 04/10/19 0602  BILITOT 0.5 0.9 0.7  AST 24 18 13*  ALT 18 13 11   ALKPHOS 93 92 74  PROT 6.5 8.1 6.5  ALBUMIN 3.1* 3.7 2.9*    TUMOR MARKERS: No results for input(s): AFPTM, CEA, CA199, CHROMGRNA in the last 8760 hours.  Assessment and Plan: 64 y.o. female with history of asthma, CHF, COPD, and bladder cancer with prior cystectomy with ileal conduit creation in 2015.  Recent CT abdomen pelvis on 3/22 revealed: Prior cystectomy. Ileal conduit with right lower quadrant urostomy. There is left hydronephrosis with left renal cortical thinning and left renal atrophy suggesting a longstanding process. Ureteral stents are in place. The left ureteral stent proximal aspect is in the mid left ureter.  Dilated small bowel loops into the pelvis where there are fecalized small bowel loops near the surgical suture line. Findings compatible with small bowel obstruction. Exact transition not  visualized.  Aortic atherosclerosis.   She presents today for bilateral ureteral stent exchanges via the ileal conduit. Details/risks of procedure, including but not limited to, internal bleeding, infection, injury to adjacent structures discussed with patient with her understanding and consent.   Thank you for this interesting consult.  I greatly enjoyed meeting Mackensi Rosenstock and look forward to participating in their care.  A copy of this report was sent to the requesting provider on this date.  Electronically Signed: D. Rowe Robert, PA-C 04/15/2019, 1:48 PM   I spent a total of 20 minutes in face to face in clinical consultation, greater than 50% of which was counseling/coordinating care for bilateral ureteral stent  exchanges via ileal conduit

## 2019-04-17 ENCOUNTER — Other Ambulatory Visit (HOSPITAL_COMMUNITY): Payer: Medicare HMO

## 2019-04-17 ENCOUNTER — Ambulatory Visit (HOSPITAL_COMMUNITY): Payer: Medicare HMO

## 2019-05-23 ENCOUNTER — Telehealth: Payer: Self-pay

## 2019-05-23 NOTE — Telephone Encounter (Signed)
NOTES ON FILE FROM [facility] [phone], SENT REFERRAL TO SCHEDULING 

## 2019-06-02 NOTE — Progress Notes (Signed)
Cardiology Office Note:   Date:  06/03/2019  NAME:  Shelia Chan    MRN: [id number] DOB:  December 26, 1955   PCP:  Loretha Brasil, FNP  Cardiologist:  No primary care provider on file.   Referring MD: Loretha Brasil, FNP   Chief Complaint  Patient presents with  . Heart Murmur   History of Present Illness:   Shelia Chan is a 63 y.o. female with a hx of CKD stage IV, COPD, bladder cancer  who is being seen today for the evaluation of cardiac murmur/palpitations at the request of Daye, Deneda T, FNP.  She is recently relocated from [place].  Apparently she had a history of bladder cancer with cystectomy and reconstruction.  She also has stage IV kidney disease.  She is had recent ureteral stent placement per her report.  She reports that she was diagnosed with a heart attack last year in [place].  She reports no stent was placed and the heart surgery was performed.  She was simply put on aspirin statin.  She reports no chest pain or pressure with the event.  The event is unclear.  She does have a longstanding history of tobacco abuse but has recently quit.  She is vaping.  She reports that her primary care physician noticed a cardiac murmur and she was sent for evaluation.  She reports ultrasounds in [place] but she is not had an updated echocardiogram here.  She really only reports symptoms of rapid heartbeat when she gets up and exerts herself.  She reports when she tries to exert herself heavily she will feel a sensation of her heart beating fast.  She gets 6-7 episodes per week.  Symptoms started around 3 months ago.  It can last several minutes and then resolves with cessation of activity.  She is never worn a cardiac monitor that I can tell.  No recent TSH.  She reports the symptoms are bothersome but do not happen every time she exerts herself.  She reports no chest pain or pressure with it.  She reports no increasing shortness of breath with it either.  She does have significant  lower extremity edema.  Given her CKD she was instructed to not take Lasix.  I did instruct her that taking Lasix is okay even if she has significant CKD.  She is amendable to retry it.  She denies chest pain, shortness of breath or palpitations in office today.  Her maternal grandfather had a history of heart disease.  No history of heart disease in her father or mother.  Problem List 1. CKD 4 2. COPD 3. Bladder CA s/p cystectomy and conduit   Past Medical History: Past Medical History:  Diagnosis Date  . Arthritis   . Asthma   . Blood transfusion without reported diagnosis   . Cancer ([place])   . CHF (congestive heart failure) ([place])   . COPD (chronic obstructive pulmonary disease) ([place])   . Heart murmur   . Immune deficiency disorder ([place])   . Renal disorder     Past Surgical History: Past Surgical History:  Procedure Laterality Date  . BLADDER REMOVAL  118/22/2015  . FRACTURE SURGERY Left 02/2018   pins  . IR CATHETER TUBE CHANGE  04/15/2019  . IR CATHETER TUBE CHANGE  04/15/2019    Current Medications: Current Meds  Medication Sig  . acetaminophen (TYLENOL) 500 MG tablet Take 500-1,000 mg by mouth every 6 (six) hours as needed for mild pain or headache.  Marland Kitchen  ARIPiprazole (ABILIFY) 30 MG tablet Take 30 mg by mouth daily.  Marland Kitchen aspirin EC 81 MG tablet Take 81 mg by mouth daily.  Marland Kitchen atorvastatin (LIPITOR) 10 MG tablet Take 10 mg by mouth at bedtime.   . calcitRIOL (ROCALTROL) 0.25 MCG capsule Take 0.25 mcg by mouth daily.  . hydrOXYzine (VISTARIL) 50 MG capsule Take 50 mg by mouth daily as needed for anxiety.  . metoprolol succinate (TOPROL-XL) 25 MG 24 hr tablet Take 25 mg by mouth daily.  . Multiple Vitamin (MULTIVITAMIN ADULT PO) Take 1 tablet by mouth daily.  . pantoprazole (PROTONIX) 40 MG tablet Take 40 mg by mouth daily before breakfast.   . traZODone (DESYREL) 100 MG tablet Take 100 mg by mouth at bedtime.      Allergies:    Patient has no known allergies.   Social  History: Social History   Socioeconomic History  . Marital status: Divorced    Spouse name: Not on file  . Number of children: Not on file  . Years of education: Not on file  . Highest education level: Not on file  Occupational History  . Occupation: disabled  Tobacco Use  . Smoking status: Former Smoker    Packs/day: 1.00    Years: 50.00    Pack years: 50.00    Types: Cigarettes    Quit date: 01/21/2019    Years since quitting: 0.3  . Smokeless tobacco: Never Used  Substance and Sexual Activity  . Alcohol use: Not Currently  . Drug use: Yes    Types: Marijuana    Comment: occasional   . Sexual activity: Not on file  Other Topics Concern  . Not on file  Social History Narrative  . Not on file   Social Determinants of Health   Financial Resource Strain:   . Difficulty of Paying Living Expenses:   Food Insecurity:   . Worried About Charity fundraiser in the Last Year:   . Arboriculturist in the Last Year:   Transportation Needs:   . Film/video editor (Medical):   Marland Kitchen Lack of Transportation (Non-Medical):   Physical Activity:   . Days of Exercise per Week:   . Minutes of Exercise per Session:   Stress:   . Feeling of Stress :   Social Connections:   . Frequency of Communication with Friends and Family:   . Frequency of Social Gatherings with Friends and Family:   . Attends Religious Services:   . Active Member of Clubs or Organizations:   . Attends Archivist Meetings:   Marland Kitchen Marital Status:      Family History: The patient's family history includes Heart disease in her maternal grandfather.  ROS:   All other ROS reviewed and negative. Pertinent positives noted in the HPI.     EKGs/Labs/Other Studies Reviewed:   The following studies were personally reviewed by me today:  EKG:  EKG is ordered today.  The ekg ordered today demonstrates sinus bradycardia, heart rate 57, no acute ischemic changes, no evidence of prior infarction, and was personally  reviewed by me.   Recent Labs: 04/10/2019: ALT 11; BUN 20; Creatinine, Ser 2.03; Hemoglobin 9.5; Magnesium 1.6; Platelets 409; Potassium 3.4; Sodium 137   Recent Lipid Panel No results found for: CHOL, TRIG, HDL, CHOLHDL, VLDL, LDLCALC, LDLDIRECT  Physical Exam:   VS:  BP 115/68   Temp (!) 96.9 F (36.1 C)   Ht 5\' 1"  (1.549 m)   Wt 154 lb (69.9  kg)   SpO2 (!) 87%   BMI 29.10 kg/m    Wt Readings from Last 3 Encounters:  06/03/19 154 lb (69.9 kg)  04/08/19 127 lb (57.6 kg)  03/11/19 127 lb (57.6 kg)    General: Well nourished, well developed, in no acute distress Heart: Atraumatic, normal size  Eyes: PEERLA, EOMI  Neck: Supple, no JVD Endocrine: No thryomegaly Cardiac: Normal S1, S2; RRR; no murmurs, rubs, or gallops Lungs: Clear to auscultation bilaterally, no wheezing, rhonchi or rales  Abd: Soft, nontender, no hepatomegaly  Ext: 2+ pitting edema up to mid shins Musculoskeletal: No deformities, BUE and BLE strength normal and equal Skin: Warm and dry, no rashes   Neuro: Alert and oriented to person, place, time, and situation, CNII-XII grossly intact, no focal deficits  Psych: Normal mood and affect   ASSESSMENT:   Shelia Chan is a 64 y.o. female who presents for the following: 1. Palpitations   2. Murmur   3. Leg edema     PLAN:   1. Palpitations -She reports intermittent episodes of rapid heartbeat sensation when she exerts herself.  Symptoms resolve with cessation of activity.  Possibly she is having an arrhythmia.  EKG today demonstrates normal sinus rhythm with normal intervals and no acute ischemic changes or evidence of prior infarction.  Unclear history of this heart attack she had in [place].  She did not take any medications other than aspirin statin.  No stent or CABG surgery reported.  She will need an echocardiogram.  I also recommended TSH.  I would also like to obtain a 3-day Zio patch to exclude any significant arrhythmia such as atrial fibrillation.   Risk factors include CKD.  2. Murmur -Faint systolic ejection murmur.  We will obtain an echocardiogram.  3. Leg edema -She has significant lower extremity edema.  She is not on Lasix.  This could be related to her CKD.  We will check an echo as above as well as check a BNP.  I would like for her to start Lasix 40 mg daily.  I think this is in her best interest.  This will be okay despite her CKD.   Disposition: Return in about 3 months (around 09/03/2019).  Medication Adjustments/Labs and Tests Ordered: Current medicines are reviewed at length with the patient today.  Concerns regarding medicines are outlined above.  Orders Placed This Encounter  Procedures  . TSH  . Brain natriuretic peptide  . LONG TERM MONITOR (3-14 DAYS)  . EKG 12-Lead  . ECHOCARDIOGRAM COMPLETE   Meds ordered this encounter  Medications  . furosemide (LASIX) 40 MG tablet    Sig: Take 1 tablet (40 mg total) by mouth daily.    Dispense:  90 tablet    Refill:  3    Patient Instructions  Medication Instructions:  Start Lasix 40 mg daily   *If you need a refill on your cardiac medications before your next appointment, please call your pharmacy*   Lab Work: BNP, TSH today   If you have labs (blood work) drawn today and your tests are completely normal, you will receive your results only by: Marland Kitchen MyChart Message (if you have MyChart) OR . A paper copy in the mail If you have any lab test that is abnormal or we need to change your treatment, we will call you to review the results.   Testing/Procedures: Echocardiogram - Your physician has requested that you have an echocardiogram. Echocardiography is a painless test that uses sound waves to  create images of your heart. It provides your doctor with information about the size and shape of your heart and how well your heart's chambers and valves are working. This procedure takes approximately one hour. There are no restrictions for this procedure. This will be  performed at our [facility] location - [address], [address].  Your physician has recommended that you wear a 3 DAY ZIO-PATCH monitor. The Zio patch cardiac monitor continuously records heart rhythm data for up to 14 days, this is for patients being evaluated for multiple types heart rhythms. For the first 24 hours post application, please avoid getting the Zio monitor wet in the shower or by excessive sweating during exercise. After that, feel free to carry on with regular activities. Keep soaps and lotions away from the ZIO XT Patch.  This will be mailed to you, please expect 7-10 days to receive.        Follow-Up: At [facility], you and your health needs are our priority.  As part of our continuing mission to provide you with exceptional heart care, we have created designated Provider Care Teams.  These Care Teams include your primary Cardiologist (physician) and Advanced Practice Providers (APPs -  Physician Assistants and Nurse Practitioners) who all work together to provide you with the care you need, when you need it.  We recommend signing up for the patient portal called "MyChart".  Sign up information is provided on this After Visit Summary.  MyChart is used to connect with patients for Virtual Visits (Telemedicine).  Patients are able to view lab/test results, encounter notes, upcoming appointments, etc.  Non-urgent messages can be sent to your provider as well.   To learn more about what you can do with MyChart, go to [email or web address].    Your next appointment:   3 month(s)  The format for your next appointment:   In Person  Provider:   Eleonore Chiquito, MD        Signed, Addison Naegeli. Audie Box, [place]  [address] [place], [place] [address] [phone]  06/03/2019 12:35 PM

## 2019-06-03 ENCOUNTER — Ambulatory Visit (INDEPENDENT_AMBULATORY_CARE_PROVIDER_SITE_OTHER): Payer: Medicare HMO | Admitting: Cardiovascular Disease

## 2019-06-03 ENCOUNTER — Encounter: Payer: Self-pay | Admitting: Cardiovascular Disease

## 2019-06-03 ENCOUNTER — Other Ambulatory Visit: Payer: Self-pay

## 2019-06-03 VITALS — BP 115/68 | Temp 96.9°F | Ht 61.0 in | Wt 154.0 lb

## 2019-06-03 DIAGNOSIS — R011 Cardiac murmur, unspecified: Secondary | ICD-10-CM

## 2019-06-03 DIAGNOSIS — R600 Localized edema: Secondary | ICD-10-CM | POA: Diagnosis not present

## 2019-06-03 DIAGNOSIS — R002 Palpitations: Secondary | ICD-10-CM | POA: Diagnosis not present

## 2019-06-03 MED ORDER — FUROSEMIDE 40 MG PO TABS: 40.0000 mg | ORAL_TABLET | Freq: Every day | ORAL | 3 refills | Status: DC

## 2019-06-03 NOTE — Patient Instructions (Addendum)
Medication Instructions:  Start Lasix 40 mg daily   *If you need a refill on your cardiac medications before your next appointment, please call your pharmacy*   Lab Work: BNP, TSH today   If you have labs (blood work) drawn today and your tests are completely normal, you will receive your results only by: Marland Kitchen MyChart Message (if you have MyChart) OR . A paper copy in the mail If you have any lab test that is abnormal or we need to change your treatment, we will call you to review the results.   Testing/Procedures: Echocardiogram - Your physician has requested that you have an echocardiogram. Echocardiography is a painless test that uses sound waves to create images of your heart. It provides your doctor with information about the size and shape of your heart and how well your heart's chambers and valves are working. This procedure takes approximately one hour. There are no restrictions for this procedure. This will be performed at our [facility] location - [address], [address].  Your physician has recommended that you wear a 3 DAY ZIO-PATCH monitor. The Zio patch cardiac monitor continuously records heart rhythm data for up to 14 days, this is for patients being evaluated for multiple types heart rhythms. For the first 24 hours post application, please avoid getting the Zio monitor wet in the shower or by excessive sweating during exercise. After that, feel free to carry on with regular activities. Keep soaps and lotions away from the ZIO XT Patch.  This will be mailed to you, please expect 7-10 days to receive.        Follow-Up: At [facility], you and your health needs are our priority.  As part of our continuing mission to provide you with exceptional heart care, we have created designated Provider Care Teams.  These Care Teams include your primary Cardiologist (physician) and Advanced Practice Providers (APPs -  Physician Assistants and Nurse Practitioners) who all work together  to provide you with the care you need, when you need it.  We recommend signing up for the patient portal called "MyChart".  Sign up information is provided on this After Visit Summary.  MyChart is used to connect with patients for Virtual Visits (Telemedicine).  Patients are able to view lab/test results, encounter notes, upcoming appointments, etc.  Non-urgent messages can be sent to your provider as well.   To learn more about what you can do with MyChart, go to [email or web address].    Your next appointment:   3 month(s)  The format for your next appointment:   In Person  Provider:   Eleonore Chiquito, MD

## 2019-06-04 LAB — BRAIN NATRIURETIC PEPTIDE: BNP: 42.7 pg/mL (ref 0.0–100.0)

## 2019-06-04 LAB — TSH: TSH: 2.6 u[IU]/mL (ref 0.450–4.500)

## 2019-06-07 ENCOUNTER — Ambulatory Visit (INDEPENDENT_AMBULATORY_CARE_PROVIDER_SITE_OTHER): Payer: Medicare HMO

## 2019-06-07 DIAGNOSIS — R002 Palpitations: Secondary | ICD-10-CM

## 2019-06-21 ENCOUNTER — Other Ambulatory Visit: Payer: Self-pay

## 2019-06-21 ENCOUNTER — Ambulatory Visit (HOSPITAL_COMMUNITY): Payer: Medicare HMO | Attending: Cardiology

## 2019-06-21 DIAGNOSIS — J449 Chronic obstructive pulmonary disease, unspecified: Secondary | ICD-10-CM | POA: Insufficient documentation

## 2019-06-21 DIAGNOSIS — I509 Heart failure, unspecified: Secondary | ICD-10-CM | POA: Diagnosis not present

## 2019-06-21 DIAGNOSIS — Z87891 Personal history of nicotine dependence: Secondary | ICD-10-CM | POA: Diagnosis not present

## 2019-06-21 DIAGNOSIS — R011 Cardiac murmur, unspecified: Secondary | ICD-10-CM

## 2019-06-21 DIAGNOSIS — R002 Palpitations: Secondary | ICD-10-CM | POA: Diagnosis not present

## 2019-06-21 DIAGNOSIS — I083 Combined rheumatic disorders of mitral, aortic and tricuspid valves: Secondary | ICD-10-CM | POA: Insufficient documentation

## 2019-07-10 ENCOUNTER — Telehealth: Payer: Self-pay | Admitting: Cardiovascular Disease

## 2019-07-10 MED ORDER — METOPROLOL SUCCINATE ER 50 MG PO TB24: 50.0000 mg | ORAL_TABLET | Freq: Every day | ORAL | 3 refills | Status: DC

## 2019-07-10 NOTE — Telephone Encounter (Signed)
Called Mrs. Shuping. Has ectopic atrial rhythm at times. Will increase metoprolol succinate to 50 mg daily.   [place] T. Audie Box, [place]  [address], [place], [place] [address] [phone]  8:19 AM

## 2019-07-15 ENCOUNTER — Other Ambulatory Visit: Payer: Self-pay

## 2019-07-15 ENCOUNTER — Other Ambulatory Visit (HOSPITAL_COMMUNITY): Payer: Self-pay | Admitting: Diagnostic Radiology

## 2019-07-15 ENCOUNTER — Other Ambulatory Visit (HOSPITAL_COMMUNITY): Payer: Self-pay | Admitting: Interventional Radiology

## 2019-07-15 ENCOUNTER — Ambulatory Visit (HOSPITAL_COMMUNITY)
Admission: RE | Admit: 2019-07-15 | Discharge: 2019-07-15 | Disposition: A | Discharge status: Home / Self Care | Payer: Medicare HMO | Source: Ambulatory Visit | Attending: Interventional Radiology | Admitting: Interventional Radiology

## 2019-07-15 DIAGNOSIS — Z466 Encounter for fitting and adjustment of urinary device: Secondary | ICD-10-CM | POA: Insufficient documentation

## 2019-07-15 DIAGNOSIS — N1330 Unspecified hydronephrosis: Secondary | ICD-10-CM

## 2019-07-15 HISTORY — PX: IR EXT NEPHROURETERAL CATH EXCHANGE: IMG5418

## 2019-07-15 MED ORDER — IOHEXOL 300 MG/ML  SOLN
50.0000 mL | Freq: Once | INTRAMUSCULAR | Status: AC | PRN
  Administered 2019-07-15: 15 mL

## 2019-07-15 NOTE — Procedures (Signed)
   Interventional Radiology Procedure:   Indications: Bladder cancer and history of cystectomy with ileal conduit.  Bilateral nephroureteral catheters  Procedure: Exchange of bilateral nephroureteral cathters  Findings: 10 Fr drain on right side.  12 Fr drain on left side  Complications: None     EBL: None  Plan: Plan for routine exchange in 3 months.    Nicolas Banh R. Anselm Pancoast, MD  Pager: [phone]

## 2019-07-26 ENCOUNTER — Other Ambulatory Visit: Payer: Self-pay | Admitting: Internal Medicine

## 2019-08-02 ENCOUNTER — Other Ambulatory Visit: Payer: Self-pay | Admitting: Internal Medicine

## 2019-08-02 DIAGNOSIS — N184 Chronic kidney disease, stage 4 (severe): Secondary | ICD-10-CM

## 2019-08-12 ENCOUNTER — Ambulatory Visit
Admission: RE | Admit: 2019-08-12 | Discharge: 2019-08-12 | Disposition: A | Discharge status: Home / Self Care | Payer: Medicare HMO | Source: Ambulatory Visit | Attending: Internal Medicine | Admitting: Internal Medicine

## 2019-08-12 DIAGNOSIS — N184 Chronic kidney disease, stage 4 (severe): Secondary | ICD-10-CM

## 2019-08-19 ENCOUNTER — Emergency Department (HOSPITAL_COMMUNITY)
Admission: EM | Admit: 2019-08-19 | Discharge: 2019-08-20 | Disposition: A | Discharge status: Home / Self Care | Payer: Medicare HMO | Attending: Emergency Medicine | Admitting: Emergency Medicine

## 2019-08-19 ENCOUNTER — Other Ambulatory Visit: Payer: Self-pay

## 2019-08-19 ENCOUNTER — Emergency Department (HOSPITAL_COMMUNITY): Payer: Medicare HMO

## 2019-08-19 DIAGNOSIS — R011 Cardiac murmur, unspecified: Secondary | ICD-10-CM | POA: Diagnosis not present

## 2019-08-19 DIAGNOSIS — R9431 Abnormal electrocardiogram [ECG] [EKG]: Secondary | ICD-10-CM | POA: Diagnosis not present

## 2019-08-19 DIAGNOSIS — R0602 Shortness of breath: Secondary | ICD-10-CM | POA: Diagnosis not present

## 2019-08-19 DIAGNOSIS — Z5321 Procedure and treatment not carried out due to patient leaving prior to being seen by health care provider: Secondary | ICD-10-CM | POA: Insufficient documentation

## 2019-08-19 LAB — CBC
HCT: 45.3 % (ref 36.0–46.0)
Hemoglobin: 14.5 g/dL (ref 12.0–15.0)
MCH: 28.8 pg (ref 26.0–34.0)
MCHC: 32 g/dL (ref 30.0–36.0)
MCV: 89.9 fL (ref 80.0–100.0)
Platelets: 501 10*3/uL — ABNORMAL HIGH (ref 150–400)
RBC: 5.04 MIL/uL (ref 3.87–5.11)
RDW: 13.6 % (ref 11.5–15.5)
WBC: 8.2 10*3/uL (ref 4.0–10.5)
nRBC: 0 % (ref 0.0–0.2)

## 2019-08-19 LAB — BASIC METABOLIC PANEL
Anion gap: 14 (ref 5–15)
BUN: 20 mg/dL (ref 8–23)
CO2: 28 mmol/L (ref 22–32)
Calcium: 9.9 mg/dL (ref 8.9–10.3)
Chloride: 98 mmol/L (ref 98–111)
Creatinine, Ser: 2.23 mg/dL — ABNORMAL HIGH (ref 0.44–1.00)
GFR calc Af Amer: 26 mL/min — ABNORMAL LOW (ref >60)
GFR calc non Af Amer: 22 mL/min — ABNORMAL LOW (ref >60)
Glucose, Bld: 105 mg/dL — ABNORMAL HIGH (ref 70–99)
Potassium: 3.7 mmol/L (ref 3.5–5.1)
Sodium: 140 mmol/L (ref 135–145)

## 2019-08-19 NOTE — ED Triage Notes (Signed)
Pt here from [place] office for eval of worsening shob x 2 weeks, worsening heart murmur, and abnormal EKG.

## 2019-08-19 NOTE — ED Notes (Signed)
Patient states her ride is here and she will set up lab tests with her cardiologist in the morning so she wont have to wait.

## 2019-08-20 ENCOUNTER — Telehealth: Payer: Self-pay | Admitting: Cardiovascular Disease

## 2019-08-20 NOTE — Telephone Encounter (Signed)
   Pt said she was in the ED yesterday and was discharge without knowing her lab results. She would like to know if Dr. Kathalene Frames nurse can read it to her.   Please advise

## 2019-08-20 NOTE — Telephone Encounter (Signed)
Everything looks fine. Kidney function is stable.   Shelia Chan T. Audie Box, [place]  [address], [place], [place] [address] [phone]  4:06 PM

## 2019-08-20 NOTE — Telephone Encounter (Signed)
Hey, will you check these labs- they are not resulted since being done at hospital, looks to me like everything is stable from previous, but have to have you review them just to have your input.

## 2019-08-22 NOTE — Telephone Encounter (Signed)
Called patient, advised of message from MD. Patient verbalized understanding,.

## 2019-09-08 NOTE — Progress Notes (Signed)
Cardiology Office Note:   Date:  09/09/2019  NAME:  Shelia Chan    MRN: [id number] DOB:  1955/02/05   PCP:  System, Pcp Not In  Cardiologist:  Evalina Field, MD   Referring MD: Loretha Brasil, FNP   Chief Complaint  Patient presents with  . Irregular Heart Beat   History of Present Illness:   Shelia Chan is a 64 y.o. female with a hx of bladder CA s/p resection with conduit, CKD IV, COPD, atrial tachycardia who presents for follow-up. Evaluated several months ago for murmur and palpitations. Echo with aortic valve sclerosis. Zio with A tach. Started on metoprolol.   She reports has been doing well.  She has had very infrequent episodes of heart palpitations since increasing the metoprolol to 50 mg daily.  She also reports that when her potassium is low she does feel palpitations as well.  She is taking potassium supplement and doing quite well.  She reports she is exercising every day.  She walks her dog up to 5 times per day without any limitations such as chest pain, shortness of breath or palpitations.  Her lower extremity edema is improved with Lasix as well as compression stockings.  She also elevates her legs as much as possible.  She reports she is working with her nephrologist to possibly pursue a kidney transplant.  I do agree with this.  I think this will be a good option for her.  Overall, she appears to be doing well.  Problem List 1. CKD 4 2. COPD 3. Bladder CA s/p cystectomy and conduit  4. Atrial tachycardia   Past Medical History: Past Medical History:  Diagnosis Date  . Arthritis   . Asthma   . Blood transfusion without reported diagnosis   . Cancer ([place])   . CHF (congestive heart failure) ([place])   . COPD (chronic obstructive pulmonary disease) ([place])   . Heart murmur   . Immune deficiency disorder ([place])   . Renal disorder     Past Surgical History: Past Surgical History:  Procedure Laterality Date  . BLADDER REMOVAL  118/22/2015  . FRACTURE SURGERY  Left 02/2018   pins  . IR CATHETER TUBE CHANGE  04/15/2019  . IR CATHETER TUBE CHANGE  04/15/2019  . IR EXT NEPHROURETERAL CATH EXCHANGE  07/15/2019    Current Medications: Current Meds  Medication Sig  . acetaminophen (TYLENOL) 500 MG tablet Take 500-1,000 mg by mouth every 6 (six) hours as needed for mild pain or headache.  . ARIPiprazole (ABILIFY) 30 MG tablet Take 30 mg by mouth daily.  Marland Kitchen aspirin EC 81 MG tablet Take 81 mg by mouth daily.  Marland Kitchen atorvastatin (LIPITOR) 10 MG tablet Take 10 mg by mouth at bedtime.   . calcitRIOL (ROCALTROL) 0.25 MCG capsule Take 0.25 mcg by mouth daily.  . hydrOXYzine (VISTARIL) 50 MG capsule Take 50 mg by mouth daily as needed for anxiety.  . metoprolol succinate (TOPROL XL) 50 MG 24 hr tablet Take 1 tablet (50 mg total) by mouth daily. Take with or immediately following a meal.  . Multiple Vitamin (MULTIVITAMIN ADULT PO) Take 1 tablet by mouth daily.  . pantoprazole (PROTONIX) 40 MG tablet Take 40 mg by mouth daily before breakfast.   . traZODone (DESYREL) 100 MG tablet Take 100 mg by mouth at bedtime.   . [DISCONTINUED] metoprolol succinate (TOPROL XL) 50 MG 24 hr tablet Take 1 tablet (50 mg total) by mouth daily. Take with or immediately following a meal.  Allergies:    Patient has no known allergies.   Social History: Social History   Socioeconomic History  . Marital status: Divorced    Spouse name: Not on file  . Number of children: Not on file  . Years of education: Not on file  . Highest education level: Not on file  Occupational History  . Occupation: disabled  Tobacco Use  . Smoking status: Former Smoker    Packs/day: 1.00    Years: 50.00    Pack years: 50.00    Types: Cigarettes    Quit date: 01/21/2019    Years since quitting: 0.6  . Smokeless tobacco: Never Used  Vaping Use  . Vaping Use: Never used  Substance and Sexual Activity  . Alcohol use: Not Currently  . Drug use: Yes    Types: Marijuana    Comment: occasional   .  Sexual activity: Not on file  Other Topics Concern  . Not on file  Social History Narrative  . Not on file   Social Determinants of Health   Financial Resource Strain:   . Difficulty of Paying Living Expenses: Not on file  Food Insecurity:   . Worried About Charity fundraiser in the Last Year: Not on file  . Ran Out of Food in the Last Year: Not on file  Transportation Needs:   . Lack of Transportation (Medical): Not on file  . Lack of Transportation (Non-Medical): Not on file  Physical Activity:   . Days of Exercise per Week: Not on file  . Minutes of Exercise per Session: Not on file  Stress:   . Feeling of Stress : Not on file  Social Connections:   . Frequency of Communication with Friends and Family: Not on file  . Frequency of Social Gatherings with Friends and Family: Not on file  . Attends Religious Services: Not on file  . Active Member of Clubs or Organizations: Not on file  . Attends Archivist Meetings: Not on file  . Marital Status: Not on file     Family History: The patient's family history includes Heart disease in her maternal grandfather.  ROS:   All other ROS reviewed and negative. Pertinent positives noted in the HPI.     EKGs/Labs/Other Studies Reviewed:   The following studies were personally reviewed by me today:  TTE 06/21/2019 1. Left ventricular ejection fraction, by estimation, is 60 to 65%. The  left ventricle has normal function. The left ventricle has no regional  wall motion abnormalities. Left ventricular diastolic parameters are  consistent with Grade I diastolic  dysfunction (impaired relaxation). The average left ventricular global  longitudinal strain is -22.5 %. The global longitudinal strain is normal.  2. Right ventricular systolic function is normal. The right ventricular  size is normal. There is normal pulmonary artery systolic pressure. The  estimated right ventricular systolic pressure is 59.5 mmHg.  3. The  mitral valve is normal in structure. Mild mitral valve  regurgitation. No evidence of mitral stenosis.  4. The aortic valve is normal in structure. Aortic valve regurgitation is  mild. Mild aortic valve sclerosis is present, with no evidence of aortic  valve stenosis.  5. The inferior vena cava is normal in size with greater than 50%  respiratory variability, suggesting right atrial pressure of 3 mmHg.   Zio 07/08/2019 Impression:   1. Frequent episodes of ectopic atrial rhythm and ectopic atrial tachycardia.  2. No atrial fibrillation.     Recent Labs: 04/10/2019:  ALT 11; Magnesium 1.6 06/03/2019: BNP 42.7; TSH 2.600 08/19/2019: BUN 20; Creatinine, Ser 2.23; Hemoglobin 14.5; Platelets 501; Potassium 3.7; Sodium 140   Recent Lipid Panel No results found for: CHOL, TRIG, HDL, CHOLHDL, VLDL, LDLCALC, LDLDIRECT  Physical Exam:   VS:  BP 116/70   Pulse 63   Ht 5' 1.5" (1.562 m)   Wt 137 lb (62.1 kg)   SpO2 96%   BMI 25.47 kg/m    Wt Readings from Last 3 Encounters:  09/09/19 137 lb (62.1 kg)  08/19/19 134 lb (60.8 kg)  06/03/19 154 lb (69.9 kg)    General: Well nourished, well developed, in no acute distress Heart: Atraumatic, normal size  Eyes: PEERLA, EOMI  Neck: Supple, no JVD Endocrine: No thryomegaly Cardiac: Normal S1, S2; RRR; no murmurs, rubs, or gallops Lungs: Clear to auscultation bilaterally, no wheezing, rhonchi or rales  Abd: Soft, nontender, no hepatomegaly  Ext: No edema, pulses 2+ Musculoskeletal: No deformities, BUE and BLE strength normal and equal Skin: Warm and dry, no rashes   Neuro: Alert and oriented to person, place, time, and situation, CNII-XII grossly intact, no focal deficits  Psych: Normal mood and affect   ASSESSMENT:   Shelia Chan is a 65 y.o. female who presents for the following: 1. Atrial tachycardia ([place])     PLAN:   1. Atrial tachycardia ([facility]) -Frequent atrial tachycardia episodes seen on recent monitor.  Symptoms have  improved with 50 mg of metoprolol succinate daily.  She will continue this. -Recent thyroid studies are normal.  Recent echocardiogram normal. -She also has episodes of palpitations when her potassium is low.  I instructed her to make sure she keeps a close eye on this.  She could be having PVCs.  Should she have more frequent symptoms she should be reevaluated with a BMP.   -We will plan to see her on a yearly basis.  Disposition: Return in about 1 year (around 09/08/2020).  Medication Adjustments/Labs and Tests Ordered: Current medicines are reviewed at length with the patient today.  Concerns regarding medicines are outlined above.  No orders of the defined types were placed in this encounter.  Meds ordered this encounter  Medications  . potassium chloride SA (KLOR-CON) 20 MEQ tablet    Sig: Take 1 tablet (20 mEq total) by mouth daily.    Dispense:  90 tablet    Refill:  3  . metoprolol succinate (TOPROL XL) 50 MG 24 hr tablet    Sig: Take 1 tablet (50 mg total) by mouth daily. Take with or immediately following a meal.    Dispense:  90 tablet    Refill:  3    Patient Instructions  Medication Instructions:  The current medical regimen is effective;  continue present plan and medications.  *If you need a refill on your cardiac medications before your next appointment, please call your pharmacy*   Follow-Up: At [facility], you and your health needs are our priority.  As part of our continuing mission to provide you with exceptional heart care, we have created designated Provider Care Teams.  These Care Teams include your primary Cardiologist (physician) and Advanced Practice Providers (APPs -  Physician Assistants and Nurse Practitioners) who all work together to provide you with the care you need, when you need it.  We recommend signing up for the patient portal called "MyChart".  Sign up information is provided on this After Visit Summary.  MyChart is used to connect with  patients for Virtual Visits (  Telemedicine).  Patients are able to view lab/test results, encounter notes, upcoming appointments, etc.  Non-urgent messages can be sent to your provider as well.   To learn more about what you can do with MyChart, go to [email or web address].    Your next appointment:   12 month(s)  The format for your next appointment:   In Person  Provider:   Eleonore Chiquito, MD        Time Spent with Patient: I have spent a total of 25 minutes with patient reviewing hospital notes, telemetry, EKGs, labs and examining the patient as well as establishing an assessment and plan that was discussed with the patient.  > 50% of time was spent in direct patient care.  Signed, Addison Naegeli. Audie Box, [place]  [address] [place], [place] [address] [phone]  09/09/2019 4:35 PM

## 2019-09-09 ENCOUNTER — Encounter: Payer: Self-pay | Admitting: Cardiovascular Disease

## 2019-09-09 ENCOUNTER — Other Ambulatory Visit: Payer: Self-pay

## 2019-09-09 ENCOUNTER — Ambulatory Visit (INDEPENDENT_AMBULATORY_CARE_PROVIDER_SITE_OTHER): Payer: Medicare HMO | Admitting: Cardiovascular Disease

## 2019-09-09 VITALS — BP 116/70 | HR 63 | Ht 61.5 in | Wt 137.0 lb

## 2019-09-09 DIAGNOSIS — I471 Supraventricular tachycardia: Secondary | ICD-10-CM | POA: Diagnosis not present

## 2019-09-09 MED ORDER — METOPROLOL SUCCINATE ER 50 MG PO TB24: 50.0000 mg | ORAL_TABLET | Freq: Every day | ORAL | 3 refills | Status: DC

## 2019-09-09 MED ORDER — POTASSIUM CHLORIDE CRYS ER 20 MEQ PO TBCR: 20.0000 meq | EXTENDED_RELEASE_TABLET | Freq: Every day | ORAL | 3 refills | Status: DC

## 2019-09-09 NOTE — Patient Instructions (Signed)

## 2019-10-14 ENCOUNTER — Ambulatory Visit (HOSPITAL_COMMUNITY)
Admission: RE | Admit: 2019-10-14 | Discharge: 2019-10-14 | Disposition: A | Discharge status: Home / Self Care | Payer: Medicare HMO | Source: Ambulatory Visit | Attending: Diagnostic Radiology | Admitting: Diagnostic Radiology

## 2019-10-14 ENCOUNTER — Other Ambulatory Visit: Payer: Self-pay

## 2019-10-14 ENCOUNTER — Other Ambulatory Visit (HOSPITAL_COMMUNITY): Payer: Self-pay | Admitting: Diagnostic Radiology

## 2019-10-14 ENCOUNTER — Other Ambulatory Visit (HOSPITAL_COMMUNITY): Payer: Self-pay | Admitting: Interventional Radiology

## 2019-10-14 DIAGNOSIS — Z4803 Encounter for change or removal of drains: Secondary | ICD-10-CM | POA: Diagnosis present

## 2019-10-14 DIAGNOSIS — N1330 Unspecified hydronephrosis: Secondary | ICD-10-CM | POA: Diagnosis not present

## 2019-10-14 HISTORY — PX: IR EXT NEPHROURETERAL CATH EXCHANGE: IMG5418

## 2019-10-14 MED ORDER — IOHEXOL 300 MG/ML  SOLN
50.0000 mL | Freq: Once | INTRAMUSCULAR | Status: AC | PRN
  Administered 2019-10-14: 15 mL

## 2019-10-14 NOTE — Procedures (Signed)
Interventional Radiology Procedure Note  Procedure: routine exchange of bilateral retrograde nephro-ureteral drain, through ostomy.  Right: 00X Left 12 F  Complications: None Recommendations:  - to ostomy drainage - dc home  Signed,  Dulcy Fanny. Earleen Newport, DO

## 2020-01-06 ENCOUNTER — Ambulatory Visit (HOSPITAL_COMMUNITY)
Admission: RE | Admit: 2020-01-06 | Discharge: 2020-01-06 | Disposition: A | Discharge status: Home / Self Care | Payer: Medicare HMO | Source: Ambulatory Visit | Attending: Interventional Radiology | Admitting: Interventional Radiology

## 2020-01-06 ENCOUNTER — Other Ambulatory Visit (HOSPITAL_COMMUNITY): Payer: Self-pay | Admitting: Interventional Radiology

## 2020-01-06 ENCOUNTER — Other Ambulatory Visit: Payer: Self-pay

## 2020-01-06 DIAGNOSIS — N1330 Unspecified hydronephrosis: Secondary | ICD-10-CM

## 2020-01-06 DIAGNOSIS — Z436 Encounter for attention to other artificial openings of urinary tract: Secondary | ICD-10-CM | POA: Insufficient documentation

## 2020-01-06 HISTORY — PX: IR EXT NEPHROURETERAL CATH EXCHANGE: IMG5418

## 2020-01-06 MED ORDER — IOHEXOL 300 MG/ML  SOLN
50.0000 mL | Freq: Once | INTRAMUSCULAR | Status: AC | PRN
  Administered 2020-01-06: 18 mL

## 2020-01-06 NOTE — Procedures (Signed)
Pre Procedure Dx: Hydronephrosis Post Procedure Dx: Same  Successful fluoroscopic guided exchange of bilateral 12 French, 45 cm nephroureteral catheters.  Note, the right-sided previously 10 [place] nephroureteral catheter was upsized to 96 French given encrustation.   EBL: None No immediate complications.   PLAN:  Given encrustation, will reduce the frequency of exchanges from 12 weeks to 10 weeks.   Ronny Bacon, MD Pager #: [phone]

## 2020-03-06 ENCOUNTER — Other Ambulatory Visit: Payer: Self-pay

## 2020-03-06 ENCOUNTER — Other Ambulatory Visit: Payer: Self-pay | Admitting: General Practice

## 2020-03-06 ENCOUNTER — Ambulatory Visit
Admission: RE | Admit: 2020-03-06 | Discharge: 2020-03-06 | Disposition: A | Discharge status: Home / Self Care | Payer: Medicare HMO | Source: Ambulatory Visit | Attending: General Practice | Admitting: General Practice

## 2020-03-06 DIAGNOSIS — M25551 Pain in right hip: Secondary | ICD-10-CM

## 2020-03-06 DIAGNOSIS — M79605 Pain in left leg: Secondary | ICD-10-CM

## 2020-03-06 DIAGNOSIS — M79604 Pain in right leg: Secondary | ICD-10-CM

## 2020-03-06 DIAGNOSIS — M25552 Pain in left hip: Secondary | ICD-10-CM

## 2020-03-16 ENCOUNTER — Ambulatory Visit (HOSPITAL_COMMUNITY)
Admission: RE | Admit: 2020-03-16 | Discharge: 2020-03-16 | Disposition: A | Discharge status: Home / Self Care | Payer: Medicare HMO | Source: Ambulatory Visit | Attending: Interventional Radiology | Admitting: Interventional Radiology

## 2020-03-16 ENCOUNTER — Other Ambulatory Visit: Payer: Self-pay

## 2020-03-16 ENCOUNTER — Other Ambulatory Visit (HOSPITAL_COMMUNITY): Payer: Self-pay | Admitting: Interventional Radiology

## 2020-03-16 DIAGNOSIS — Z4803 Encounter for change or removal of drains: Secondary | ICD-10-CM | POA: Insufficient documentation

## 2020-03-16 DIAGNOSIS — N1330 Unspecified hydronephrosis: Secondary | ICD-10-CM | POA: Diagnosis not present

## 2020-03-16 HISTORY — PX: IR EXT NEPHROURETERAL CATH EXCHANGE: IMG5418

## 2020-03-16 MED ORDER — IOHEXOL 300 MG/ML  SOLN
50.0000 mL | Freq: Once | INTRAMUSCULAR | Status: AC | PRN
  Administered 2020-03-16: 10 mL

## 2020-03-16 MED ORDER — LIDOCAINE HCL 1 % IJ SOLN
INTRAMUSCULAR | Status: AC
  Filled 2020-03-16: qty 20

## 2020-03-16 NOTE — Procedures (Signed)
Interventional Radiology Procedure Note  Procedure: Bilateral nephroureteral drain exchange  Indication: Hydronephrosis  Findings:  Bilateral 12 ft 45 cm nephroureteral drains in place.  Please refer to procedural dictation for full description.  Complications: None  EBL: < 10 mL  Miachel Roux, MD [phone]

## 2020-05-25 ENCOUNTER — Other Ambulatory Visit (HOSPITAL_COMMUNITY): Payer: Self-pay | Admitting: Interventional Radiology

## 2020-05-25 ENCOUNTER — Other Ambulatory Visit: Payer: Self-pay

## 2020-05-25 ENCOUNTER — Ambulatory Visit (HOSPITAL_COMMUNITY)
Admission: RE | Admit: 2020-05-25 | Discharge: 2020-05-25 | Disposition: A | Discharge status: Home / Self Care | Payer: Medicare HMO | Source: Ambulatory Visit | Attending: Interventional Radiology | Admitting: Interventional Radiology

## 2020-05-25 DIAGNOSIS — N1330 Unspecified hydronephrosis: Secondary | ICD-10-CM

## 2020-05-25 DIAGNOSIS — C679 Malignant neoplasm of bladder, unspecified: Secondary | ICD-10-CM | POA: Diagnosis not present

## 2020-05-25 DIAGNOSIS — Z436 Encounter for attention to other artificial openings of urinary tract: Secondary | ICD-10-CM | POA: Diagnosis present

## 2020-05-25 DIAGNOSIS — N135 Crossing vessel and stricture of ureter without hydronephrosis: Secondary | ICD-10-CM | POA: Insufficient documentation

## 2020-05-25 HISTORY — PX: IR EXT NEPHROURETERAL CATH EXCHANGE: IMG5418

## 2020-05-25 MED ORDER — IOHEXOL 300 MG/ML  SOLN
50.0000 mL | Freq: Once | INTRAMUSCULAR | Status: AC | PRN
  Administered 2020-05-25: 15 mL

## 2020-05-25 NOTE — Procedures (Signed)
Pre Procedure Dx: Bladder cancer, bilateral ureteral occlusion.  Post Procedure Dx: Same  Successful bilateral nephroureteral catheter exchange; R - 12 Fr; L - 10 Fr  EBL: None No immediate complications.   Ronny Bacon, MD Pager #: [phone]

## 2020-07-17 ENCOUNTER — Other Ambulatory Visit: Payer: Self-pay

## 2020-07-17 ENCOUNTER — Encounter (HOSPITAL_COMMUNITY): Payer: Self-pay

## 2020-07-17 ENCOUNTER — Observation Stay (HOSPITAL_COMMUNITY): Payer: Medicare Other

## 2020-07-17 ENCOUNTER — Inpatient Hospital Stay (HOSPITAL_COMMUNITY)
Admission: EM | Admit: 2020-07-17 | Discharge: 2020-07-20 | DRG: 690 | Disposition: A | Discharge status: Home / Self Care | Payer: Medicare Other | Attending: Family Medicine | Admitting: Family Medicine

## 2020-07-17 ENCOUNTER — Emergency Department (HOSPITAL_COMMUNITY): Payer: Medicare Other

## 2020-07-17 ENCOUNTER — Other Ambulatory Visit: Payer: Self-pay | Admitting: Radiology

## 2020-07-17 DIAGNOSIS — I959 Hypotension, unspecified: Secondary | ICD-10-CM | POA: Diagnosis present

## 2020-07-17 DIAGNOSIS — N1330 Unspecified hydronephrosis: Secondary | ICD-10-CM | POA: Diagnosis present

## 2020-07-17 DIAGNOSIS — J449 Chronic obstructive pulmonary disease, unspecified: Secondary | ICD-10-CM | POA: Diagnosis present

## 2020-07-17 DIAGNOSIS — Z960 Presence of urogenital implants: Secondary | ICD-10-CM | POA: Diagnosis present

## 2020-07-17 DIAGNOSIS — Z79891 Long term (current) use of opiate analgesic: Secondary | ICD-10-CM

## 2020-07-17 DIAGNOSIS — M1990 Unspecified osteoarthritis, unspecified site: Secondary | ICD-10-CM | POA: Diagnosis present

## 2020-07-17 DIAGNOSIS — Z9221 Personal history of antineoplastic chemotherapy: Secondary | ICD-10-CM

## 2020-07-17 DIAGNOSIS — B9620 Unspecified Escherichia coli [E. coli] as the cause of diseases classified elsewhere: Secondary | ICD-10-CM | POA: Diagnosis present

## 2020-07-17 DIAGNOSIS — N390 Urinary tract infection, site not specified: Secondary | ICD-10-CM | POA: Diagnosis not present

## 2020-07-17 DIAGNOSIS — Z906 Acquired absence of other parts of urinary tract: Secondary | ICD-10-CM

## 2020-07-17 DIAGNOSIS — Z923 Personal history of irradiation: Secondary | ICD-10-CM

## 2020-07-17 DIAGNOSIS — Z932 Ileostomy status: Secondary | ICD-10-CM

## 2020-07-17 DIAGNOSIS — N1339 Other hydronephrosis: Secondary | ICD-10-CM

## 2020-07-17 DIAGNOSIS — R310 Gross hematuria: Secondary | ICD-10-CM | POA: Diagnosis present

## 2020-07-17 DIAGNOSIS — Z8551 Personal history of malignant neoplasm of bladder: Secondary | ICD-10-CM

## 2020-07-17 DIAGNOSIS — N184 Chronic kidney disease, stage 4 (severe): Secondary | ICD-10-CM | POA: Diagnosis present

## 2020-07-17 DIAGNOSIS — Z79899 Other long term (current) drug therapy: Secondary | ICD-10-CM

## 2020-07-17 DIAGNOSIS — Z20822 Contact with and (suspected) exposure to covid-19: Secondary | ICD-10-CM | POA: Diagnosis present

## 2020-07-17 DIAGNOSIS — Z87891 Personal history of nicotine dependence: Secondary | ICD-10-CM

## 2020-07-17 DIAGNOSIS — Z7982 Long term (current) use of aspirin: Secondary | ICD-10-CM

## 2020-07-17 DIAGNOSIS — I5032 Chronic diastolic (congestive) heart failure: Secondary | ICD-10-CM | POA: Diagnosis present

## 2020-07-17 DIAGNOSIS — B964 Proteus (mirabilis) (morganii) as the cause of diseases classified elsewhere: Secondary | ICD-10-CM | POA: Diagnosis present

## 2020-07-17 HISTORY — PX: IR EXT NEPHROURETERAL CATH EXCHANGE: IMG5418

## 2020-07-17 LAB — CBC WITH DIFFERENTIAL/PLATELET
Abs Immature Granulocytes: 0.52 10*3/uL — ABNORMAL HIGH (ref 0.00–0.07)
Basophils Absolute: 0.1 10*3/uL (ref 0.0–0.1)
Basophils Relative: 1 %
Eosinophils Absolute: 0 10*3/uL (ref 0.0–0.5)
Eosinophils Relative: 0 %
HCT: 37.8 % (ref 36.0–46.0)
Hemoglobin: 12.4 g/dL (ref 12.0–15.0)
Immature Granulocytes: 3 %
Lymphocytes Relative: 7 %
Lymphs Abs: 1.3 10*3/uL (ref 0.7–4.0)
MCH: 29.4 pg (ref 26.0–34.0)
MCHC: 32.8 g/dL (ref 30.0–36.0)
MCV: 89.6 fL (ref 80.0–100.0)
Monocytes Absolute: 1.2 10*3/uL — ABNORMAL HIGH (ref 0.1–1.0)
Monocytes Relative: 6 %
Neutro Abs: 15.4 10*3/uL — ABNORMAL HIGH (ref 1.7–7.7)
Neutrophils Relative %: 83 %
Platelets: 663 10*3/uL — ABNORMAL HIGH (ref 150–400)
RBC: 4.22 MIL/uL (ref 3.87–5.11)
RDW: 14.6 % (ref 11.5–15.5)
WBC: 18.5 10*3/uL — ABNORMAL HIGH (ref 4.0–10.5)
nRBC: 0 % (ref 0.0–0.2)

## 2020-07-17 LAB — URINALYSIS, MICROSCOPIC (REFLEX)
RBC / HPF: >50 RBC/hpf (ref 0–5)
WBC, UA: >50 WBC/hpf (ref 0–5)

## 2020-07-17 LAB — URINALYSIS, ROUTINE W REFLEX MICROSCOPIC

## 2020-07-17 LAB — RESP PANEL BY RT-PCR (FLU A&B, COVID) ARPGX2
Influenza A by PCR: NEGATIVE
Influenza B by PCR: NEGATIVE
SARS Coronavirus 2 by RT PCR: NEGATIVE

## 2020-07-17 LAB — BASIC METABOLIC PANEL
Anion gap: 11 (ref 5–15)
BUN: 32 mg/dL — ABNORMAL HIGH (ref 8–23)
CO2: 27 mmol/L (ref 22–32)
Calcium: 9.4 mg/dL (ref 8.9–10.3)
Chloride: 100 mmol/L (ref 98–111)
Creatinine, Ser: 2.01 mg/dL — ABNORMAL HIGH (ref 0.44–1.00)
GFR, Estimated: 27 mL/min — ABNORMAL LOW (ref >60)
Glucose, Bld: 123 mg/dL — ABNORMAL HIGH (ref 70–99)
Potassium: 3.7 mmol/L (ref 3.5–5.1)
Sodium: 138 mmol/L (ref 135–145)

## 2020-07-17 MED ORDER — SODIUM CHLORIDE 0.9 % IV SOLN
1.0000 g | INTRAVENOUS | Status: DC
  Administered 2020-07-18 – 2020-07-20 (×3): 1 g via INTRAVENOUS
  Filled 2020-07-17 (×3): qty 1

## 2020-07-17 MED ORDER — FENTANYL CITRATE (PF) 100 MCG/2ML IJ SOLN
25.0000 ug | Freq: Once | INTRAMUSCULAR | Status: AC
  Administered 2020-07-18: 25 ug via INTRAVENOUS
  Filled 2020-07-17: qty 2

## 2020-07-17 MED ORDER — IOHEXOL 300 MG/ML  SOLN: 25.0000 mL | Freq: Once | INTRAMUSCULAR | Status: DC | PRN

## 2020-07-17 MED ORDER — FENTANYL CITRATE (PF) 100 MCG/2ML IJ SOLN
12.5000 ug | INTRAMUSCULAR | Status: DC | PRN
  Administered 2020-07-17 (×2): 12.5 ug via INTRAVENOUS
  Filled 2020-07-17 (×2): qty 2

## 2020-07-17 MED ORDER — SODIUM CHLORIDE 0.9 % IV SOLN: INTRAVENOUS | Status: DC

## 2020-07-17 MED ORDER — HYDROMORPHONE HCL 1 MG/ML IJ SOLN
1.0000 mg | Freq: Once | INTRAMUSCULAR | Status: AC
  Administered 2020-07-17: 1 mg via INTRAVENOUS
  Filled 2020-07-17: qty 1

## 2020-07-17 MED ORDER — SODIUM CHLORIDE 0.9 % IV BOLUS
1000.0000 mL | Freq: Once | INTRAVENOUS | Status: AC
  Administered 2020-07-17: 500 mL via INTRAVENOUS

## 2020-07-17 MED ORDER — ONDANSETRON HCL 4 MG/2ML IJ SOLN
4.0000 mg | Freq: Once | INTRAMUSCULAR | Status: AC
  Administered 2020-07-17: 4 mg via INTRAVENOUS
  Filled 2020-07-17: qty 2

## 2020-07-17 MED ORDER — SODIUM CHLORIDE 0.9 % IV SOLN
1.0000 g | Freq: Once | INTRAVENOUS | Status: AC
  Administered 2020-07-17: 1 g via INTRAVENOUS
  Filled 2020-07-17: qty 10

## 2020-07-17 NOTE — ED Notes (Signed)
hospitalist at the bedside 

## 2020-07-17 NOTE — ED Provider Notes (Signed)
[facility] DEPT Provider Note   CSN: [id number] Arrival date & time: 07/17/20  0547     History Chief Complaint  Patient presents with   Flank Pain    Shelia Chan is a 65 y.o. female.  HPI 65 year old female presents with hematuria and flank pain. This all started yesterday. Her urostomy has had increased bloody output that has become darker.  She is also having severe right flank pain.  No fevers.  She has been having vomiting though now nothing else is coming up.  Some right-sided abdominal discomfort as well.  Pain is rated as severe.  Past Medical History:  Diagnosis Date   Arthritis    Asthma    Blood transfusion without reported diagnosis    Cancer ([place])    CHF (congestive heart failure) ([facility])    COPD (chronic obstructive pulmonary disease) ([place])    Heart murmur    Immune deficiency disorder ([place])    Renal disorder     Patient Active Problem List   Diagnosis Date Noted   Hydronephrosis 07/17/2020   Small bowel obstruction ([place]) 04/08/2019   CKD (chronic kidney disease), stage IV ([place]) 04/08/2019    Past Surgical History:  Procedure Laterality Date   BLADDER REMOVAL  118/22/2015   FRACTURE SURGERY Left 02/2018   pins   IR CATHETER TUBE CHANGE  04/15/2019   IR CATHETER TUBE CHANGE  04/15/2019   IR EXT NEPHROURETERAL CATH EXCHANGE  07/15/2019   IR EXT NEPHROURETERAL CATH EXCHANGE  10/14/2019   IR EXT NEPHROURETERAL CATH EXCHANGE  10/14/2019   IR EXT NEPHROURETERAL CATH EXCHANGE  01/06/2020   IR EXT NEPHROURETERAL CATH EXCHANGE  03/16/2020   IR EXT NEPHROURETERAL CATH EXCHANGE  05/25/2020     OB History   No obstetric history on file.     Family History  Problem Relation Age of Onset   Heart disease Maternal Grandfather     Social History   Tobacco Use   Smoking status: Former    Packs/day: 1.00    Years: 50.00    Pack years: 50.00    Types: Cigarettes    Quit date: 01/21/2019    Years since quitting: 1.4   Smokeless  tobacco: Never  Vaping Use   Vaping Use: Never used  Substance Use Topics   Alcohol use: Not Currently   Drug use: Yes    Types: Marijuana    Comment: occasional     Home Medications Prior to Admission medications   Medication Sig Start Date End Date Taking? Authorizing Provider  acetaminophen (TYLENOL) 500 MG tablet Take 500-1,000 mg by mouth every 6 (six) hours as needed for mild pain or headache.    [provider]  ARIPiprazole (ABILIFY) 30 MG tablet Take 30 mg by mouth daily. 03/19/19   [provider]  aspirin EC 81 MG tablet Take 81 mg by mouth daily.    [provider]  atorvastatin (LIPITOR) 10 MG tablet Take 10 mg by mouth at bedtime.  02/18/19   [provider]  calcitRIOL (ROCALTROL) 0.25 MCG capsule Take 0.25 mcg by mouth daily. 02/18/19   [provider]  famotidine (PEPCID) 20 MG tablet  08/21/19   [provider]  furosemide (LASIX) 40 MG tablet Take 1 tablet (40 mg total) by mouth daily. 06/03/19 09/01/19  O'NealCassie Freer, MD  hydrOXYzine (VISTARIL) 50 MG capsule Take 50 mg by mouth daily as needed for anxiety. 03/19/19   [provider]  metoprolol  succinate (TOPROL XL) 50 MG 24 hr tablet Take 1 tablet (50 mg total) by mouth daily. Take with or immediately following a meal. 09/09/19   O'Neal, Cassie Freer, MD  Multiple Vitamin (MULTIVITAMIN ADULT PO) Take 1 tablet by mouth daily.    [provider]  pantoprazole (PROTONIX) 40 MG tablet Take 40 mg by mouth daily before breakfast.  02/18/19   [provider]  potassium chloride SA (KLOR-CON) 20 MEQ tablet Take 1 tablet (20 mEq total) by mouth daily. 09/09/19   Geralynn Rile, MD  traZODone (DESYREL) 100 MG tablet Take 100 mg by mouth at bedtime.  03/19/19   [provider]    Allergies    Patient has no known allergies.  Review of Systems   Review of Systems  Constitutional:  Negative for fever.  Gastrointestinal:  Positive for  abdominal pain, nausea and vomiting.  Genitourinary:  Positive for flank pain and hematuria.  Musculoskeletal:  Positive for back pain.  All other systems reviewed and are negative.  Physical Exam Updated Vital Signs BP 122/64   Pulse 62   Temp 99.1 F (37.3 C) (Oral)   Resp (!) 22   Ht 5\' 1"  (1.549 m)   Wt 60.3 kg   SpO2 99%   BMI 25.13 kg/m   Physical Exam Vitals and nursing note reviewed.  Constitutional:      Appearance: She is well-developed. She is not ill-appearing or diaphoretic.  HENT:     Head: Normocephalic and atraumatic.     Right Ear: External ear normal.     Left Ear: External ear normal.     Nose: Nose normal.  Eyes:     General:        Right eye: No discharge.        Left eye: No discharge.  Cardiovascular:     Rate and Rhythm: Normal rate and regular rhythm.     Heart sounds: Murmur heard.  Pulmonary:     Effort: Pulmonary effort is normal.     Breath sounds: Normal breath sounds.  Abdominal:     Palpations: Abdomen is soft.     Tenderness: There is abdominal tenderness in the right upper quadrant. There is right CVA tenderness.  Skin:    General: Skin is warm and dry.  Neurological:     Mental Status: She is alert.  Psychiatric:        Mood and Affect: Mood is not anxious.    ED Results / Procedures / Treatments   Labs (all labs ordered are listed, but only abnormal results are displayed) Labs Reviewed  CBC WITH DIFFERENTIAL/PLATELET - Abnormal; Notable for the following components:      Result Value   WBC 18.5 (*)    Platelets 663 (*)    Neutro Abs 15.4 (*)    Monocytes Absolute 1.2 (*)    Abs Immature Granulocytes 0.52 (*)    All other components within normal limits  BASIC METABOLIC PANEL - Abnormal; Notable for the following components:   Glucose, Bld 123 (*)    BUN 32 (*)    Creatinine, Ser 2.01 (*)    GFR, Estimated 27 (*)    All other components within normal limits  RESP PANEL BY RT-PCR (FLU A&B, COVID) ARPGX2  URINE  CULTURE  URINALYSIS, ROUTINE W REFLEX MICROSCOPIC  HIV ANTIBODY (ROUTINE TESTING W REFLEX)    EKG None  Radiology CT Renal Stone Study  Result Date: 07/17/2020 CLINICAL DATA:  65 year old female with history  of cystectomy, ileal conduit, nephroureteral catheters. Right flank pain and dark red drainage from urostomy bag. EXAM: CT ABDOMEN AND PELVIS WITHOUT CONTRAST TECHNIQUE: Multidetector CT imaging of the abdomen and pelvis was performed following the standard protocol without IV contrast. COMPARISON:  Nephroureteral catheter exchange 05/25/2020. CT Abdomen and Pelvis 04/08/2019. FINDINGS: Lower chest: Possible pulmonary hyperinflation but otherwise negative. Hepatobiliary: Negative noncontrast liver and gallbladder. Pancreas: Negative. Spleen: Negative. Adrenals/Urinary Tract: Normal adrenal glands. Progressive left renal atrophy since last year. Resolved left hydronephrosis with a left nephroureteral stent in place. Moderate to severe right hydronephrosis is new since last year along with right side nephromegaly. Right nephroureteral stent has migrated distally compared to last year, with the proximal pigtail at or below the ureteropelvic junction (coronal image 55 today). No calculus is identified along the course of the stent. Ileal conduit otherwise appears unremarkable. Bladder is surgically absent. Stomach/Bowel: No dilated small or large bowel. Numerous bowel loops in the pelvis. Mild retained stool in the descending colon. Unremarkable stomach and duodenum. Cecum is difficult to delineate. There is a right lower quadrant bowel anastomosis with no adverse features. No free air or free fluid. Vascular/Lymphatic: Aortoiliac calcified atherosclerosis. Normal caliber abdominal aorta. Vascular patency is not evaluated in the absence of IV contrast. No lymphadenopathy identified. Reproductive: Surgically absent. Other: No pelvic free fluid. Musculoskeletal: Bilateral sacral insufficiency fractures are  new since last year but age indeterminate. Similar insufficiency fractures at the pubic symphysis are new since last year. Otherwise Stable visualized osseous structures., including previous proximal left femur ORIF. IMPRESSION: 1. New moderate to severe right hydronephrosis appears related to distal migration of the right nephroureteral stent. No urinary calculus identified, and otherwise unremarkable ileal conduit. 2. Left hydronephrosis seen last year has resolved. Chronic left renal atrophy. 3. Age indeterminate insufficiency fractures of the bilateral sacral ala and pubic symphysis are new since last year. 4. Aortic Atherosclerosis (ICD10-I70.0). Electronically Signed   By: Genevie Ann M.D.   On: 07/17/2020 07:10    Procedures Procedures   Medications Ordered in ED Medications  cefTRIAXone (ROCEPHIN) 1 g in sodium chloride 0.9 % 100 mL IVPB (has no administration in time range)  0.9 %  sodium chloride infusion (has no administration in time range)  fentaNYL (SUBLIMAZE) injection 12.5 mcg (has no administration in time range)  cefTRIAXone (ROCEPHIN) 1 g in sodium chloride 0.9 % 100 mL IVPB (has no administration in time range)  sodium chloride 0.9 % bolus 1,000 mL (500 mLs Intravenous New Bag/Given 07/17/20 0826)  HYDROmorphone (DILAUDID) injection 1 mg (1 mg Intravenous Given 07/17/20 0828)  ondansetron (ZOFRAN) injection 4 mg (4 mg Intravenous Given 07/17/20 3154)    ED Course  I have reviewed the triage vital signs and the nursing notes.  Pertinent labs & imaging results that were available during my care of the patient were reviewed by me and considered in my medical decision making (see chart for details).    MDM Rules/Calculators/A&P                          Patient's pain is significantly improved with IV Dilaudid and Zofran.  She was given IV fluids.  Creatinine is near her chronic baseline.  However she does have a significant leukocytosis and the CT scan, which has been personally  reviewed, shows significant hydronephrosis.  This appears to be due to a malpositioned stent.  It was due to be exchanged later this month.  I discussed with Dr.  Bell of urology, and she will most likely need IR to change this.  Because of her leukocytosis, which may be secondary to degranulation and vomiting but infection cannot be excluded, he is asking for IV Rocephin and hospitalist admission.  Urology will consult.  Discussed with Dr. Hal Hope. Final Clinical Impression(s) / ED Diagnoses Final diagnoses:  Hydronephrosis, right    Rx / DC Orders ED Discharge Orders     None        Sherwood Gambler, MD 07/17/20 [phone]

## 2020-07-17 NOTE — ED Notes (Signed)
IV access attempted multiple times with no success. IV team consult ordered.

## 2020-07-17 NOTE — ED Notes (Signed)
Pt stated to this nurse, a hx of CHF. ED provider notified and aware and fluid bolus rate reduced to 500 cc's NS.

## 2020-07-17 NOTE — ED Triage Notes (Signed)
Pt to ED from home with c/o R sided flank pain. Pt has extensive urology hx with urostomy tubes placed several years ago. Pt is scheduled to have stents replaced 7/18. Pt reports pain starting yesterday and gradually getting worse since then. Dark red drainage noted in her urostomy bag also gradually getting worse.

## 2020-07-17 NOTE — ED Notes (Signed)
Report given to floor nurse

## 2020-07-17 NOTE — ED Notes (Signed)
Pt back from IR at this time from bilateral external nephroureteral catheter tube exchange via ileostomy. Report received from [place] [facility], [place].

## 2020-07-17 NOTE — Plan of Care (Signed)

## 2020-07-17 NOTE — H&P (Signed)
History and Physical    Shelia Chan MIW:[id number] DOB: 05-Jul-1955 DOA: 07/17/2020  PCP: [name], MD  Patient coming from: Home.  Chief Complaint: Right flank pain.  HPI: Shelia Chan is a 65 y.o. female with history of urinary bladder cancer status post surgery chemoradiation and urostomy and bilateral stent placement followed by Dr. Claudia Desanctis urologist presents to the ER with complaints of increasing pain in the right flank with hematuria and urostomy bag.  Patient symptoms been ongoing for the last 24 hours with some nausea vomiting.  Denies fever chills.  ED Course: In the ER patient is having a temperature of 99 F with labs showing creatinine of around 2 at baseline and leukocytosis of 18,000.  CT renal study shows migration of right-sided nephroureteral stent.  On-call urology has been consulted.  Patient admitted for further management.  COVID test was negative.  Review of Systems: As per HPI, rest all negative.   Past Medical History:  Diagnosis Date   Arthritis    Asthma    Blood transfusion without reported diagnosis    Cancer ([place])    CHF (congestive heart failure) (HCC)    COPD (chronic obstructive pulmonary disease) ([place])    Heart murmur    Immune deficiency disorder [facility])    Renal disorder     Past Surgical History:  Procedure Laterality Date   BLADDER REMOVAL  118/22/2015   FRACTURE SURGERY Left 02/2018   pins   IR CATHETER TUBE CHANGE  04/15/2019   IR CATHETER TUBE CHANGE  04/15/2019   IR EXT NEPHROURETERAL CATH EXCHANGE  07/15/2019   IR EXT NEPHROURETERAL CATH EXCHANGE  10/14/2019   IR EXT NEPHROURETERAL CATH EXCHANGE  10/14/2019   IR EXT NEPHROURETERAL CATH EXCHANGE  01/06/2020   IR EXT NEPHROURETERAL CATH EXCHANGE  03/16/2020   IR EXT NEPHROURETERAL CATH EXCHANGE  05/25/2020     reports that she quit smoking about 17 months ago. Her smoking use included cigarettes. She has a 50.00 pack-year smoking history. She has never used smokeless tobacco. She  reports previous alcohol use. She reports current drug use. Drug: Marijuana.  No Known Allergies  Family History  Problem Relation Age of Onset   Heart disease Maternal Grandfather     Prior to Admission medications   Medication Sig Start Date End Date Taking? Authorizing Provider  acetaminophen (TYLENOL) 500 MG tablet Take 500-1,000 mg by mouth every 6 (six) hours as needed for mild pain or headache.    [provider]  ARIPiprazole (ABILIFY) 30 MG tablet Take 30 mg by mouth daily. 03/19/19   [provider]  aspirin EC 81 MG tablet Take 81 mg by mouth daily.    [provider]  atorvastatin (LIPITOR) 10 MG tablet Take 10 mg by mouth at bedtime.  02/18/19   [provider]  calcitRIOL (ROCALTROL) 0.25 MCG capsule Take 0.25 mcg by mouth daily. 02/18/19   [provider]  famotidine (PEPCID) 20 MG tablet  08/21/19   [provider]  furosemide (LASIX) 40 MG tablet Take 1 tablet (40 mg total) by mouth daily. 06/03/19 09/01/19  O'NealCassie Freer, MD  hydrOXYzine (VISTARIL) 50 MG capsule Take 50 mg by mouth daily as needed for anxiety. 03/19/19   [provider]  metoprolol succinate (TOPROL XL) 50 MG 24 hr tablet Take 1 tablet (50 mg total) by mouth daily. Take with or immediately following a meal. 09/09/19   O'Neal, Cassie Freer, MD  Multiple Vitamin (MULTIVITAMIN ADULT PO)  Take 1 tablet by mouth daily.    [provider]  pantoprazole (PROTONIX) 40 MG tablet Take 40 mg by mouth daily before breakfast.  02/18/19   [provider]  potassium chloride SA (KLOR-CON) 20 MEQ tablet Take 1 tablet (20 mEq total) by mouth daily. 09/09/19   Geralynn Rile, MD  traZODone (DESYREL) 100 MG tablet Take 100 mg by mouth at bedtime.  03/19/19   [provider]    Physical Exam: Constitutional: Moderately built and nourished. Vitals:   07/17/20 0656 07/17/20 0806 07/17/20 0822 07/17/20 0900  BP: 115/64 132/68 133/62 122/64   Pulse: (!) 59 (!) 58 (!) 56 62  Resp: 18 14 18  (!) 22  Temp:   99.1 F (37.3 C)   TempSrc:   Oral   SpO2: 100% 99% 100% 99%  Weight:      Height:       Eyes: Anicteric no pallor. ENMT: No discharge from the ears eyes nose or mouth. Neck: No mass felt.  No neck rigidity. Respiratory: No rhonchi or crepitations. Cardiovascular: S1-S2 heard. Abdomen: Soft nontender bowel sound present. Musculoskeletal: Bilateral lower extremity edema present. Skin: Chronic skin changes of bilateral lower extremity. Neurologic: Alert awake oriented time place and person.  Moves all extremities. Psychiatric: Appears normal.  Normal affect.   Labs on Admission: I have personally reviewed following labs and imaging studies  CBC: Recent Labs  Lab 07/17/20 0616  WBC 18.5*  NEUTROABS 15.4*  HGB 12.4  HCT 37.8  MCV 89.6  PLT 132*   Basic Metabolic Panel: Recent Labs  Lab 07/17/20 0616  NA 138  K 3.7  CL 100  CO2 27  GLUCOSE 123*  BUN 32*  CREATININE 2.01*  CALCIUM 9.4   GFR: Estimated Creatinine Clearance: 22.9 mL/min (A) (by C-G formula based on SCr of 2.01 mg/dL (H)). Liver Function Tests: No results for input(s): AST, ALT, ALKPHOS, BILITOT, PROT, ALBUMIN in the last 168 hours. No results for input(s): LIPASE, AMYLASE in the last 168 hours. No results for input(s): AMMONIA in the last 168 hours. Coagulation Profile: No results for input(s): INR, PROTIME in the last 168 hours. Cardiac Enzymes: No results for input(s): CKTOTAL, CKMB, CKMBINDEX, TROPONINI in the last 168 hours. BNP (last 3 results) No results for input(s): PROBNP in the last 8760 hours. HbA1C: No results for input(s): HGBA1C in the last 72 hours. CBG: No results for input(s): GLUCAP in the last 168 hours. Lipid Profile: No results for input(s): CHOL, HDL, LDLCALC, TRIG, CHOLHDL, LDLDIRECT in the last 72 hours. Thyroid Function Tests: No results for input(s): TSH, T4TOTAL, FREET4, T3FREE, THYROIDAB in the last  72 hours. Anemia Panel: No results for input(s): VITAMINB12, FOLATE, FERRITIN, TIBC, IRON, RETICCTPCT in the last 72 hours. Urine analysis:    Component Value Date/Time   COLORURINE YELLOW 04/08/2019 2123   APPEARANCEUR CLOUDY (A) 04/08/2019 2123   LABSPEC 1.012 04/08/2019 2123   PHURINE 6.0 04/08/2019 2123   GLUCOSEU NEGATIVE 04/08/2019 2123   HGBUR SMALL (A) 04/08/2019 2123   BILIRUBINUR NEGATIVE 04/08/2019 2123   KETONESUR NEGATIVE 04/08/2019 2123   PROTEINUR >=300 (A) 04/08/2019 2123   NITRITE NEGATIVE 04/08/2019 2123   LEUKOCYTESUR LARGE (A) 04/08/2019 2123   Sepsis Labs: @LABRCNTIP (procalcitonin:4,lacticidven:4) ) Recent Results (from the past 240 hour(s))  Resp Panel by RT-PCR (Flu A&B, Covid) Nasopharyngeal Swab     Status: None   Collection Time: 07/17/20  8:36 AM   Specimen: Nasopharyngeal Swab; Nasopharyngeal(NP) swabs in vial transport medium  Result Value Ref Range Status   SARS Coronavirus 2 by RT PCR NEGATIVE NEGATIVE Final    Comment: (NOTE) SARS-CoV-2 target nucleic acids are NOT DETECTED.  The SARS-CoV-2 RNA is generally detectable in upper respiratory specimens during the acute phase of infection. The lowest concentration of SARS-CoV-2 viral copies this assay can detect is 138 copies/mL. A negative result does not preclude SARS-Cov-2 infection and should not be used as the sole basis for treatment or other patient management decisions. A negative result may occur with  improper specimen collection/handling, submission of specimen other than nasopharyngeal swab, presence of viral mutation(s) within the areas targeted by this assay, and inadequate number of viral copies(<138 copies/mL). A negative result must be combined with clinical observations, patient history, and epidemiological information. The expected result is Negative.  Fact Sheet for Patients:  EntrepreneurPulse.com.au  Fact Sheet for Healthcare Providers:   IncredibleEmployment.be  This test is no t yet approved or cleared by the [place] FDA and  has been authorized for detection and/or diagnosis of SARS-CoV-2 by FDA under an Emergency Use Authorization (EUA). This EUA will remain  in effect (meaning this test can be used) for the duration of the COVID-19 declaration under Section 564(b)(1) of the Act, 21 U.S.C.section 360bbb-3(b)(1), unless the authorization is terminated  or revoked sooner.       Influenza A by PCR NEGATIVE NEGATIVE Final   Influenza B by PCR NEGATIVE NEGATIVE Final    Comment: (NOTE) The Xpert Xpress SARS-CoV-2/FLU/RSV plus assay is intended as an aid in the diagnosis of influenza from Nasopharyngeal swab specimens and should not be used as a sole basis for treatment. Nasal washings and aspirates are unacceptable for Xpert Xpress SARS-CoV-2/FLU/RSV testing.  Fact Sheet for Patients: EntrepreneurPulse.com.au  Fact Sheet for Healthcare Providers: IncredibleEmployment.be  This test is not yet approved or cleared by the [place] FDA and has been authorized for detection and/or diagnosis of SARS-CoV-2 by FDA under an Emergency Use Authorization (EUA). This EUA will remain in effect (meaning this test can be used) for the duration of the COVID-19 declaration under Section 564(b)(1) of the Act, 21 U.S.C. section 360bbb-3(b)(1), unless the authorization is terminated or revoked.  Performed at [facility], [place] [address], [place], [place] [address]      Radiological Exams on Admission: CT Renal Stone Study  Result Date: 07/17/2020 CLINICAL DATA:  65 year old female with history of cystectomy, ileal conduit, nephroureteral catheters. Right flank pain and dark red drainage from urostomy bag. EXAM: CT ABDOMEN AND PELVIS WITHOUT CONTRAST TECHNIQUE: Multidetector CT imaging of the abdomen and pelvis was performed following the standard  protocol without IV contrast. COMPARISON:  Nephroureteral catheter exchange 05/25/2020. CT Abdomen and Pelvis 04/08/2019. FINDINGS: Lower chest: Possible pulmonary hyperinflation but otherwise negative. Hepatobiliary: Negative noncontrast liver and gallbladder. Pancreas: Negative. Spleen: Negative. Adrenals/Urinary Tract: Normal adrenal glands. Progressive left renal atrophy since last year. Resolved left hydronephrosis with a left nephroureteral stent in place. Moderate to severe right hydronephrosis is new since last year along with right side nephromegaly. Right nephroureteral stent has migrated distally compared to last year, with the proximal pigtail at or below the ureteropelvic junction (coronal image 55 today). No calculus is identified along the course of the stent. Ileal conduit otherwise appears unremarkable. Bladder is surgically absent. Stomach/Bowel: No dilated small or large bowel. Numerous bowel loops in the pelvis. Mild retained stool in the descending colon. Unremarkable stomach and duodenum. Cecum is difficult to delineate. There is a right lower quadrant  bowel anastomosis with no adverse features. No free air or free fluid. Vascular/Lymphatic: Aortoiliac calcified atherosclerosis. Normal caliber abdominal aorta. Vascular patency is not evaluated in the absence of IV contrast. No lymphadenopathy identified. Reproductive: Surgically absent. Other: No pelvic free fluid. Musculoskeletal: Bilateral sacral insufficiency fractures are new since last year but age indeterminate. Similar insufficiency fractures at the pubic symphysis are new since last year. Otherwise Stable visualized osseous structures., including previous proximal left femur ORIF. IMPRESSION: 1. New moderate to severe right hydronephrosis appears related to distal migration of the right nephroureteral stent. No urinary calculus identified, and otherwise unremarkable ileal conduit. 2. Left hydronephrosis seen last year has resolved.  Chronic left renal atrophy. 3. Age indeterminate insufficiency fractures of the bilateral sacral ala and pubic symphysis are new since last year. 4. Aortic Atherosclerosis (ICD10-I70.0). Electronically Signed   By: Genevie Ann M.D.   On: 07/17/2020 07:10     Assessment/Plan Principal Problem:   Hydronephrosis Active Problems:   CKD (chronic kidney disease), stage IV (HCC)    Right flank pain likely from distal migration of the right nephroureteral stent.  Patient also has hematuria.  On-call urologist Dr. Gloriann Loan has been consulted by the ER physician.  Urologist is recommended to keep patient n.p.o.  Also requested to keep patient on antibiotics. History of diastolic CHF does have bilateral lower extremity edema present.  Does not appear to be in any CHF exacerbation at this time we will closely monitor. History of atrial tachycardia used to take metoprolol patient states she does not take it now.  Closely monitor. Chronically disease stage IV creatinine appears to be at baseline. History of bladder cancer being followed by urologist.   DVT prophylaxis: SCDs.  Since patient has hematuria and also in anticipation of procedure will hold off anticoagulation. Code Status: Full code. Family Communication: Discussed with patient. Disposition Plan: Home when stable. Consults called: Urologist. Admission status: Observation.   Rise Patience MD [facility] Pager [phone].  If 7PM-7AM, please contact night-coverage [email or web address] Password [facility]  07/17/2020, 9:45 AM

## 2020-07-17 NOTE — ED Notes (Signed)
Patient transported to CT 

## 2020-07-17 NOTE — ED Notes (Signed)
Pt is out of the room.

## 2020-07-17 NOTE — Procedures (Signed)
Pre Procedure Dx: Hydronephrosis Post Procedure Dx: Same  Successful bilateral nephroureteral catheter exchange with up sizing of the left sided drain. Both Nephroureteral catheters are now 12 Fr.  EBL: None No immediate complications.   Ronny Bacon, MD Pager #: [phone]

## 2020-07-17 NOTE — Consult Note (Signed)
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 Patient was previously followed in [place] and has recently moved to [place]. Last stent change was done in May 2022.  She presented to the emergency department with severe right-sided pain and gross hematuria.  Patient underwent a CT scan of the abdomen and pelvis that revealed a right-sided ureteral stent had migrated distally and she had severe right-sided hydronephrosis.  I spoke with interventional radiology who subsequently exchanged her ureteral stents which are now upsized to 11 [place].  She continues to have some right-sided flank pain and gross hematuria.  Her creatinine is 2.01 which is pretty close to her baseline.  Leukocytosis of 18.5.  He had many bacteria and greater than 50 WBCs.  She states she has been having intermittent low-grade fevers but has been afebrile here.  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[facility])    Renal disorder    Past Surgical History:  Procedure Laterality Date   BLADDER REMOVAL  118/22/2015   FRACTURE SURGERY Left 02/2018   pins   IR CATHETER TUBE CHANGE  04/15/2019   IR CATHETER TUBE CHANGE  04/15/2019   IR EXT NEPHROURETERAL CATH EXCHANGE  07/15/2019   IR EXT NEPHROURETERAL CATH EXCHANGE  10/14/2019   IR EXT NEPHROURETERAL CATH EXCHANGE  10/14/2019   IR EXT NEPHROURETERAL CATH EXCHANGE  01/06/2020   IR EXT NEPHROURETERAL CATH EXCHANGE  03/16/2020   IR EXT  NEPHROURETERAL CATH EXCHANGE  05/25/2020   IR EXT NEPHROURETERAL CATH EXCHANGE  07/17/2020   IR EXT NEPHROURETERAL CATH EXCHANGE  07/17/2020    Home Medications:  (Not in a hospital admission)  Allergies:  Allergies  Allergen Reactions   Metoprolol Shortness Of Breath    Family History  Problem Relation Age of Onset   Heart disease Maternal Grandfather    Social History:  reports that she quit smoking about 17 months ago. Her smoking use included cigarettes. She has a 50.00 pack-year smoking history. She has never used smokeless tobacco. She reports previous alcohol use. She reports current drug use. Drug: Marijuana.  ROS: A complete review of systems was performed.  All systems are negative except for pertinent findings as noted. ROS   Physical Exam:  Vital signs in last 24 hours: Temp:  [98 F (36.7 C)-99.6 F (37.6 C)] 99.6 F (37.6 C) (07/01 1344) Pulse Rate:  [55-86] 86 (07/01 1344) Resp:  [14-22] 20 (07/01 1344) BP: (113-142)/(62-97) 134/97 (07/01 1344) SpO2:  [98 %-100 %] 98 % (07/01 1344) Weight:  [60.3 kg] 60.3 kg (07/01 0605) General:  Alert and oriented, No acute distress HEENT: Normocephalic, atraumatic Neck: No JVD or lymphadenopathy Cardiovascular: Regular rate and rhythm Lungs: Regular rate and effort Abdomen: Soft, nontender, nondistended, no abdominal masses.  Conduit pink patent and productive of light red urine.  Both stents visible in the bag Back: No CVA tenderness Extremities: No edema Neurologic: Grossly intact  Laboratory Data:  Results for orders placed or performed during the  hospital encounter of 07/17/20 (from the past 24 hour(s))  CBC with Differential/Platelet     Status: Abnormal   Collection Time: 07/17/20  6:16 AM  Result Value Ref Range   WBC 18.5 (H) 4.0 - 10.5 K/uL   RBC 4.22 3.87 - 5.11 MIL/uL   Hemoglobin 12.4 12.0 - 15.0 g/dL   HCT 37.8 36.0 - 46.0 %   MCV 89.6 80.0 - 100.0 fL   MCH 29.4 26.0 - 34.0 pg   MCHC 32.8 30.0 - 36.0  g/dL   RDW 14.6 11.5 - 15.5 %   Platelets 663 (H) 150 - 400 K/uL   nRBC 0.0 0.0 - 0.2 %   Neutrophils Relative % 83 %   Neutro Abs 15.4 (H) 1.7 - 7.7 K/uL   Lymphocytes Relative 7 %   Lymphs Abs 1.3 0.7 - 4.0 K/uL   Monocytes Relative 6 %   Monocytes Absolute 1.2 (H) 0.1 - 1.0 K/uL   Eosinophils Relative 0 %   Eosinophils Absolute 0.0 0.0 - 0.5 K/uL   Basophils Relative 1 %   Basophils Absolute 0.1 0.0 - 0.1 K/uL   Immature Granulocytes 3 %   Abs Immature Granulocytes 0.52 (H) 0.00 - 0.07 K/uL  Basic metabolic panel     Status: Abnormal   Collection Time: 07/17/20  6:16 AM  Result Value Ref Range   Sodium 138 135 - 145 mmol/L   Potassium 3.7 3.5 - 5.1 mmol/L   Chloride 100 98 - 111 mmol/L   CO2 27 22 - 32 mmol/L   Glucose, Bld 123 (H) 70 - 99 mg/dL   BUN 32 (H) 8 - 23 mg/dL   Creatinine, Ser 2.01 (H) 0.44 - 1.00 mg/dL   Calcium 9.4 8.9 - 10.3 mg/dL   GFR, Estimated 27 (L) >60 mL/min   Anion gap 11 5 - 15  Resp Panel by RT-PCR (Flu A&B, Covid) Nasopharyngeal Swab     Status: None   Collection Time: 07/17/20  8:36 AM   Specimen: Nasopharyngeal Swab; Nasopharyngeal(NP) swabs in vial transport medium  Result Value Ref Range   SARS Coronavirus 2 by RT PCR NEGATIVE NEGATIVE   Influenza A by PCR NEGATIVE NEGATIVE   Influenza B by PCR NEGATIVE NEGATIVE  Urinalysis, Routine w reflex microscopic     Status: Abnormal   Collection Time: 07/17/20  8:47 AM  Result Value Ref Range   Color, Urine RED (A) YELLOW   APPearance TURBID (A) CLEAR   Specific Gravity, Urine  1.005 - 1.030    TEST NOT REPORTED DUE TO COLOR INTERFERENCE OF URINE PIGMENT   pH  5.0 - 8.0    TEST NOT REPORTED DUE TO COLOR INTERFERENCE OF URINE PIGMENT   Glucose, UA (A) NEGATIVE mg/dL    TEST NOT REPORTED DUE TO COLOR INTERFERENCE OF URINE PIGMENT   Hgb urine dipstick (A) NEGATIVE    TEST NOT REPORTED DUE TO COLOR INTERFERENCE OF URINE PIGMENT   Bilirubin Urine (A) NEGATIVE    TEST NOT REPORTED DUE TO COLOR  INTERFERENCE OF URINE PIGMENT   Ketones, ur (A) NEGATIVE mg/dL    TEST NOT REPORTED DUE TO COLOR INTERFERENCE OF URINE PIGMENT   Protein, ur (A) NEGATIVE mg/dL    TEST NOT REPORTED DUE TO COLOR INTERFERENCE OF URINE PIGMENT   Nitrite (A) NEGATIVE    TEST NOT REPORTED DUE TO COLOR INTERFERENCE OF URINE PIGMENT   Leukocytes,Ua (A) NEGATIVE    TEST NOT REPORTED DUE TO COLOR INTERFERENCE OF URINE PIGMENT  Urinalysis, Microscopic (reflex)     Status: Abnormal   Collection Time: 07/17/20  8:47 AM  Result Value Ref Range   RBC / HPF >50 0 - 5 RBC/hpf   WBC, UA >50 0 - 5 WBC/hpf   Bacteria, UA MANY (A) NONE SEEN   Squamous Epithelial / LPF 0-5 0 - 5   Recent Results (from the past 240 hour(s))  Resp Panel by RT-PCR (Flu A&B, Covid) Nasopharyngeal Swab     Status: None   Collection Time: 07/17/20  8:36 AM   Specimen: Nasopharyngeal Swab; Nasopharyngeal(NP) swabs in vial transport medium  Result Value Ref Range Status   SARS Coronavirus 2 by RT PCR NEGATIVE NEGATIVE Final    Comment: (NOTE) SARS-CoV-2 target nucleic acids are NOT DETECTED.  The SARS-CoV-2 RNA is generally detectable in upper respiratory specimens during the acute phase of infection. The lowest concentration of SARS-CoV-2 viral copies this assay can detect is 138 copies/mL. A negative result does not preclude SARS-Cov-2 infection and should not be used as the sole basis for treatment or other patient management decisions. A negative result may occur with  improper specimen collection/handling, submission of specimen other than nasopharyngeal swab, presence of viral mutation(s) within the areas targeted by this assay, and inadequate number of viral copies(<138 copies/mL). A negative result must be combined with clinical observations, patient history, and epidemiological information. The expected result is Negative.  Fact Sheet for Patients:  EntrepreneurPulse.com.au  Fact Sheet for Healthcare Providers:   IncredibleEmployment.be  This test is no t yet approved or cleared by the [place] FDA and  has been authorized for detection and/or diagnosis of SARS-CoV-2 by FDA under an Emergency Use Authorization (EUA). This EUA will remain  in effect (meaning this test can be used) for the duration of the COVID-19 declaration under Section 564(b)(1) of the Act, 21 U.S.C.section 360bbb-3(b)(1), unless the authorization is terminated  or revoked sooner.       Influenza A by PCR NEGATIVE NEGATIVE Final   Influenza B by PCR NEGATIVE NEGATIVE Final    Comment: (NOTE) The Xpert Xpress SARS-CoV-2/FLU/RSV plus assay is intended as an aid in the diagnosis of influenza from Nasopharyngeal swab specimens and should not be used as a sole basis for treatment. Nasal washings and aspirates are unacceptable for Xpert Xpress SARS-CoV-2/FLU/RSV testing.  Fact Sheet for Patients: EntrepreneurPulse.com.au  Fact Sheet for Healthcare Providers: IncredibleEmployment.be  This test is not yet approved or cleared by the [place] FDA and has been authorized for detection and/or diagnosis of SARS-CoV-2 by FDA under an Emergency Use Authorization (EUA). This EUA will remain in effect (meaning this test can be used) for the duration of the COVID-19 declaration under Section 564(b)(1) of the Act, 21 U.S.C. section 360bbb-3(b)(1), unless the authorization is terminated or revoked.  Performed at [facility], [place] [address], [place], [place] [address]    Creatinine: Recent Labs    07/17/20 0616  CREATININE 2.01*    Impression/Assessment:  Right hydronephrosis, history of bilateral hydronephrosis  Plan:  She has follow-up scheduled for 7/7.  She will keep that appointment for follow-up.  Continue antibiotics as and observe overnight.  Potential discharge tomorrow on oral antibiotic if she is doing well versus observing until  urine culture returns.  No further urological intervention necessary.  Marton Redwood, III 07/17/2020, 5:47 PM

## 2020-07-17 NOTE — ED Notes (Signed)
Pt attached to cardiac monitor x2 with VSS at this time. Per nigthshift RN, Lysbeth Galas, pt is a difficult stick and has no peripheral IV access at this time. Charge nurse at the bedside now to place US guided IV.

## 2020-07-17 NOTE — ED Notes (Signed)
Pt to IR at this time

## 2020-07-18 DIAGNOSIS — N1339 Other hydronephrosis: Secondary | ICD-10-CM | POA: Diagnosis present

## 2020-07-18 DIAGNOSIS — M1990 Unspecified osteoarthritis, unspecified site: Secondary | ICD-10-CM | POA: Diagnosis present

## 2020-07-18 DIAGNOSIS — Z906 Acquired absence of other parts of urinary tract: Secondary | ICD-10-CM | POA: Diagnosis not present

## 2020-07-18 DIAGNOSIS — R310 Gross hematuria: Secondary | ICD-10-CM | POA: Diagnosis present

## 2020-07-18 DIAGNOSIS — Z9221 Personal history of antineoplastic chemotherapy: Secondary | ICD-10-CM | POA: Diagnosis not present

## 2020-07-18 DIAGNOSIS — Z20822 Contact with and (suspected) exposure to covid-19: Secondary | ICD-10-CM | POA: Diagnosis present

## 2020-07-18 DIAGNOSIS — Z932 Ileostomy status: Secondary | ICD-10-CM | POA: Diagnosis not present

## 2020-07-18 DIAGNOSIS — I959 Hypotension, unspecified: Secondary | ICD-10-CM | POA: Diagnosis present

## 2020-07-18 DIAGNOSIS — B9620 Unspecified Escherichia coli [E. coli] as the cause of diseases classified elsewhere: Secondary | ICD-10-CM | POA: Diagnosis present

## 2020-07-18 DIAGNOSIS — Z8551 Personal history of malignant neoplasm of bladder: Secondary | ICD-10-CM | POA: Diagnosis not present

## 2020-07-18 DIAGNOSIS — N390 Urinary tract infection, site not specified: Secondary | ICD-10-CM | POA: Diagnosis present

## 2020-07-18 DIAGNOSIS — Z923 Personal history of irradiation: Secondary | ICD-10-CM | POA: Diagnosis not present

## 2020-07-18 DIAGNOSIS — Z79899 Other long term (current) drug therapy: Secondary | ICD-10-CM | POA: Diagnosis not present

## 2020-07-18 DIAGNOSIS — J449 Chronic obstructive pulmonary disease, unspecified: Secondary | ICD-10-CM | POA: Diagnosis present

## 2020-07-18 DIAGNOSIS — Z87891 Personal history of nicotine dependence: Secondary | ICD-10-CM | POA: Diagnosis not present

## 2020-07-18 DIAGNOSIS — Z79891 Long term (current) use of opiate analgesic: Secondary | ICD-10-CM | POA: Diagnosis not present

## 2020-07-18 DIAGNOSIS — N184 Chronic kidney disease, stage 4 (severe): Secondary | ICD-10-CM | POA: Diagnosis present

## 2020-07-18 DIAGNOSIS — B964 Proteus (mirabilis) (morganii) as the cause of diseases classified elsewhere: Secondary | ICD-10-CM | POA: Diagnosis present

## 2020-07-18 DIAGNOSIS — I5032 Chronic diastolic (congestive) heart failure: Secondary | ICD-10-CM | POA: Diagnosis present

## 2020-07-18 DIAGNOSIS — Z960 Presence of urogenital implants: Secondary | ICD-10-CM | POA: Diagnosis present

## 2020-07-18 DIAGNOSIS — N1330 Unspecified hydronephrosis: Secondary | ICD-10-CM | POA: Diagnosis present

## 2020-07-18 DIAGNOSIS — Z7982 Long term (current) use of aspirin: Secondary | ICD-10-CM | POA: Diagnosis not present

## 2020-07-18 LAB — GLUCOSE, CAPILLARY
Glucose-Capillary: 104 mg/dL — ABNORMAL HIGH (ref 70–99)
Glucose-Capillary: 92 mg/dL (ref 70–99)
Glucose-Capillary: 92 mg/dL (ref 70–99)

## 2020-07-18 LAB — CBC WITH DIFFERENTIAL/PLATELET
Abs Immature Granulocytes: 0.18 10*3/uL — ABNORMAL HIGH (ref 0.00–0.07)
Basophils Absolute: 0.1 10*3/uL (ref 0.0–0.1)
Basophils Relative: 0 %
Eosinophils Absolute: 0 10*3/uL (ref 0.0–0.5)
Eosinophils Relative: 0 %
HCT: 30.1 % — ABNORMAL LOW (ref 36.0–46.0)
Hemoglobin: 9.7 g/dL — ABNORMAL LOW (ref 12.0–15.0)
Immature Granulocytes: 1 %
Lymphocytes Relative: 4 %
Lymphs Abs: 0.9 10*3/uL (ref 0.7–4.0)
MCH: 29.4 pg (ref 26.0–34.0)
MCHC: 32.2 g/dL (ref 30.0–36.0)
MCV: 91.2 fL (ref 80.0–100.0)
Monocytes Absolute: 1.3 10*3/uL — ABNORMAL HIGH (ref 0.1–1.0)
Monocytes Relative: 6 %
Neutro Abs: 20.8 10*3/uL — ABNORMAL HIGH (ref 1.7–7.7)
Neutrophils Relative %: 89 %
Platelets: 489 10*3/uL — ABNORMAL HIGH (ref 150–400)
RBC: 3.3 MIL/uL — ABNORMAL LOW (ref 3.87–5.11)
RDW: 15 % (ref 11.5–15.5)
WBC: 23.3 10*3/uL — ABNORMAL HIGH (ref 4.0–10.5)
nRBC: 0 % (ref 0.0–0.2)

## 2020-07-18 LAB — BASIC METABOLIC PANEL
Anion gap: 9 (ref 5–15)
BUN: 25 mg/dL — ABNORMAL HIGH (ref 8–23)
CO2: 22 mmol/L (ref 22–32)
Calcium: 8.5 mg/dL — ABNORMAL LOW (ref 8.9–10.3)
Chloride: 105 mmol/L (ref 98–111)
Creatinine, Ser: 1.75 mg/dL — ABNORMAL HIGH (ref 0.44–1.00)
GFR, Estimated: 32 mL/min — ABNORMAL LOW (ref >60)
Glucose, Bld: 88 mg/dL (ref 70–99)
Potassium: 3.2 mmol/L — ABNORMAL LOW (ref 3.5–5.1)
Sodium: 136 mmol/L (ref 135–145)

## 2020-07-18 LAB — HEMOGLOBIN AND HEMATOCRIT, BLOOD
HCT: 30.6 % — ABNORMAL LOW (ref 36.0–46.0)
Hemoglobin: 10 g/dL — ABNORMAL LOW (ref 12.0–15.0)

## 2020-07-18 LAB — HIV ANTIBODY (ROUTINE TESTING W REFLEX): HIV Screen 4th Generation wRfx: NONREACTIVE

## 2020-07-18 MED ORDER — ACETAMINOPHEN 325 MG PO TABS
650.0000 mg | ORAL_TABLET | Freq: Four times a day (QID) | ORAL | Status: DC | PRN
  Administered 2020-07-18 – 2020-07-20 (×4): 650 mg via ORAL
  Filled 2020-07-18 (×4): qty 2

## 2020-07-18 MED ORDER — POTASSIUM CHLORIDE 20 MEQ PO PACK
40.0000 meq | PACK | Freq: Once | ORAL | Status: AC
  Administered 2020-07-18: 40 meq via ORAL
  Filled 2020-07-18: qty 2

## 2020-07-18 NOTE — Progress Notes (Signed)
PROGRESS NOTE    Shelia Chan  NGE:[id number] DOB: 1956/01/15 DOA: 07/17/2020 PCP: Ellyn Hack, MD   Brief Narrative:  This 65 years old female with PMH significant of urinary bladder cancer status postsurgical chemoradiation and urostomy and bilateral stent placement followed by Dr. Arita Miss urologist presented in the ED with complaint of increasing pain in the right flank with hematuria and urostomy bag.  Patient reports symptoms has been ongoing for last 24 hours associated with nausea and vomiting.  she denies any fever and chills.  CT renal study shows migration of right-sided nephroureteral stent,  urologist was consulted, patient underwent successful bilateral nephroureteral catheter exchange with upsizing of left-sided drain.  Assessment & Plan:   Principal Problem:   Hydronephrosis Active Problems:   CKD (chronic kidney disease), stage IV (HCC)  Right-sided flank pain: Patient presented with right flank pain with hematuria. CT abdomen shows distal migration of the right nephroureteral stent. Urology was consulted,  recommended to continue antibiotics. Patient underwent successful bilateral nephroureteral catheter exchange on 7/1. Continue ceftriaxone, follow-up urine cultures.  History of diastolic CHF: Does not appear volume overloaded, not in exacerbation. Continue to monitor.  History of atrial tachycardia: She used to take metoprolol.  Patient states she does not take it now since heart rate is controlled.  Chronic kidney disease stage IV: Serum creatinine seems to be at baseline.  Bladder cancer:  followed by urologist.  Hypotension:  Continue to monitor, normal saline bolus given. Continue IV fluids.   DVT prophylaxis: SCDs Code Status: Full code. Family Communication: No family at bed side. Disposition Plan:  Status is: Inpatient  Remains inpatient appropriate because:Inpatient level of care appropriate due to severity of illness  Dispo: The patient  is from: Home              Anticipated d/c is to: Home              Patient currently is not medically stable to d/c.   Difficult to place patient No  Consultants:  Urology  Procedures:  Ureteral Stent exchange Antimicrobials:   Anti-infectives (From admission, onward)    Start     Dose/Rate Route Frequency Ordered Stop   07/18/20 0600  cefTRIAXone (ROCEPHIN) 1 g in sodium chloride 0.9 % 100 mL IVPB        1 g 200 mL/hr over 30 Minutes Intravenous Every 24 hours 07/17/20 0945     07/17/20 0915  cefTRIAXone (ROCEPHIN) 1 g in sodium chloride 0.9 % 100 mL IVPB        1 g 200 mL/hr over 30 Minutes Intravenous  Once 07/17/20 0912 07/17/20 1020        Subjective: Patient was seen and examined at bedside.  Overnight events noted.   Patient reports feeling better,  she has significant amount of blood in the urostomy bag.  Objective: Vitals:   07/17/20 2156 07/18/20 0217 07/18/20 0550 07/18/20 1428  BP: 91/61 (!) 104/46 118/60 (!) 91/51  Pulse: 74 77 71 68  Resp:    15  Temp: 99.2 F (37.3 C) 99.5 F (37.5 C) 98.5 F (36.9 C) 99 F (37.2 C)  TempSrc: Oral Oral Oral Oral  SpO2: 95% 100% 97% 100%  Weight:      Height:        Intake/Output Summary (Last 24 hours) at 07/18/2020 1448 Last data filed at 07/18/2020 1300 Gross per 24 hour  Intake 818.09 ml  Output 900 ml  Net -81.91 ml   Ceasar Mons  Weights   07/17/20 0605 07/17/20 1757  Weight: 60.3 kg 60.5 kg    Examination:  General exam: Appears calm and comfortable, not in any acute distress. Respiratory system: Clear to auscultation. Respiratory effort normal. Cardiovascular system: S1 & S2 heard, RRR. No JVD, murmurs, rubs, gallops or clicks. No pedal edema. Gastrointestinal system: Abdomen is nondistended, soft and nontender. No organomegaly or masses felt. Normal bowel sounds heard.  Bright blood noted in urostomy bag. Central nervous system: Alert and oriented. No focal neurological deficits. Extremities: Symmetric 5 x 5  power.  1+ pitting edema noted. Skin: No rashes, lesions or ulcers Psychiatry: Judgement and insight appear normal. Mood & affect appropriate.     Data Reviewed: I have personally reviewed following labs and imaging studies  CBC: Recent Labs  Lab 07/17/20 0616 07/18/20 0424 07/18/20 0914  WBC 18.5* 23.3*  --   NEUTROABS 15.4* 20.8*  --   HGB 12.4 9.7* 10.0*  HCT 37.8 30.1* 30.6*  MCV 89.6 91.2  --   PLT 663* 489*  --    Basic Metabolic Panel: Recent Labs  Lab 07/17/20 0616 07/18/20 0424  NA 138 136  K 3.7 3.2*  CL 100 105  CO2 27 22  GLUCOSE 123* 88  BUN 32* 25*  CREATININE 2.01* 1.75*  CALCIUM 9.4 8.5*   GFR: Estimated Creatinine Clearance: 26.4 mL/min (A) (by C-G formula based on SCr of 1.75 mg/dL (H)). Liver Function Tests: No results for input(s): AST, ALT, ALKPHOS, BILITOT, PROT, ALBUMIN in the last 168 hours. No results for input(s): LIPASE, AMYLASE in the last 168 hours. No results for input(s): AMMONIA in the last 168 hours. Coagulation Profile: No results for input(s): INR, PROTIME in the last 168 hours. Cardiac Enzymes: No results for input(s): CKTOTAL, CKMB, CKMBINDEX, TROPONINI in the last 168 hours. BNP (last 3 results) No results for input(s): PROBNP in the last 8760 hours. HbA1C: No results for input(s): HGBA1C in the last 72 hours. CBG: Recent Labs  Lab 07/18/20 0222 07/18/20 0745  GLUCAP 104* 92   Lipid Profile: No results for input(s): CHOL, HDL, LDLCALC, TRIG, CHOLHDL, LDLDIRECT in the last 72 hours. Thyroid Function Tests: No results for input(s): TSH, T4TOTAL, FREET4, T3FREE, THYROIDAB in the last 72 hours. Anemia Panel: No results for input(s): VITAMINB12, FOLATE, FERRITIN, TIBC, IRON, RETICCTPCT in the last 72 hours. Sepsis Labs: No results for input(s): PROCALCITON, LATICACIDVEN in the last 168 hours.  Recent Results (from the past 240 hour(s))  Resp Panel by RT-PCR (Flu A&B, Covid) Nasopharyngeal Swab     Status: None    Collection Time: 07/17/20  8:36 AM   Specimen: Nasopharyngeal Swab; Nasopharyngeal(NP) swabs in vial transport medium  Result Value Ref Range Status   SARS Coronavirus 2 by RT PCR NEGATIVE NEGATIVE Final    Comment: (NOTE) SARS-CoV-2 target nucleic acids are NOT DETECTED.  The SARS-CoV-2 RNA is generally detectable in upper respiratory specimens during the acute phase of infection. The lowest concentration of SARS-CoV-2 viral copies this assay can detect is 138 copies/mL. A negative result does not preclude SARS-Cov-2 infection and should not be used as the sole basis for treatment or other patient management decisions. A negative result may occur with  improper specimen collection/handling, submission of specimen other than nasopharyngeal swab, presence of viral mutation(s) within the areas targeted by this assay, and inadequate number of viral copies(<138 copies/mL). A negative result must be combined with clinical observations, patient history, and epidemiological information. The expected result is Negative.  Fact  Sheet for Patients:  BloggerCourse.com  Fact Sheet for Healthcare Providers:  SeriousBroker.it  This test is no t yet approved or cleared by the [place] FDA and  has been authorized for detection and/or diagnosis of SARS-CoV-2 by FDA under an Emergency Use Authorization (EUA). This EUA will remain  in effect (meaning this test can be used) for the duration of the COVID-19 declaration under Section 564(b)(1) of the Act, 21 U.S.C.section 360bbb-3(b)(1), unless the authorization is terminated  or revoked sooner.       Influenza A by PCR NEGATIVE NEGATIVE Final   Influenza B by PCR NEGATIVE NEGATIVE Final    Comment: (NOTE) The Xpert Xpress SARS-CoV-2/FLU/RSV plus assay is intended as an aid in the diagnosis of influenza from Nasopharyngeal swab specimens and should not be used as a sole basis for treatment.  Nasal washings and aspirates are unacceptable for Xpert Xpress SARS-CoV-2/FLU/RSV testing.  Fact Sheet for Patients: BloggerCourse.com  Fact Sheet for Healthcare Providers: SeriousBroker.it  This test is not yet approved or cleared by the [place] FDA and has been authorized for detection and/or diagnosis of SARS-CoV-2 by FDA under an Emergency Use Authorization (EUA). This EUA will remain in effect (meaning this test can be used) for the duration of the COVID-19 declaration under Section 564(b)(1) of the Act, 21 U.S.C. section 360bbb-3(b)(1), unless the authorization is terminated or revoked.  Performed at [facility], [address], [place], [place] [address]     Radiology Studies: CT Renal Stone Study  Result Date: 07/17/2020 CLINICAL DATA:  65 year old female with history of cystectomy, ileal conduit, nephroureteral catheters. Right flank pain and dark red drainage from urostomy bag. EXAM: CT ABDOMEN AND PELVIS WITHOUT CONTRAST TECHNIQUE: Multidetector CT imaging of the abdomen and pelvis was performed following the standard protocol without IV contrast. COMPARISON:  Nephroureteral catheter exchange 05/25/2020. CT Abdomen and Pelvis 04/08/2019. FINDINGS: Lower chest: Possible pulmonary hyperinflation but otherwise negative. Hepatobiliary: Negative noncontrast liver and gallbladder. Pancreas: Negative. Spleen: Negative. Adrenals/Urinary Tract: Normal adrenal glands. Progressive left renal atrophy since last year. Resolved left hydronephrosis with a left nephroureteral stent in place. Moderate to severe right hydronephrosis is new since last year along with right side nephromegaly. Right nephroureteral stent has migrated distally compared to last year, with the proximal pigtail at or below the ureteropelvic junction (coronal image 55 today). No calculus is identified along the course of the stent. Ileal conduit  otherwise appears unremarkable. Bladder is surgically absent. Stomach/Bowel: No dilated small or large bowel. Numerous bowel loops in the pelvis. Mild retained stool in the descending colon. Unremarkable stomach and duodenum. Cecum is difficult to delineate. There is a right lower quadrant bowel anastomosis with no adverse features. No free air or free fluid. Vascular/Lymphatic: Aortoiliac calcified atherosclerosis. Normal caliber abdominal aorta. Vascular patency is not evaluated in the absence of IV contrast. No lymphadenopathy identified. Reproductive: Surgically absent. Other: No pelvic free fluid. Musculoskeletal: Bilateral sacral insufficiency fractures are new since last year but age indeterminate. Similar insufficiency fractures at the pubic symphysis are new since last year. Otherwise Stable visualized osseous structures., including previous proximal left femur ORIF. IMPRESSION: 1. New moderate to severe right hydronephrosis appears related to distal migration of the right nephroureteral stent. No urinary calculus identified, and otherwise unremarkable ileal conduit. 2. Left hydronephrosis seen last year has resolved. Chronic left renal atrophy. 3. Age indeterminate insufficiency fractures of the bilateral sacral ala and pubic symphysis are new since last year. 4. Aortic Atherosclerosis (ICD10-I70.0). Electronically Signed  By: Odessa Fleming M.D.   On: 07/17/2020 07:10   IR EXT NEPHROURETERAL CATH EXCHANGE  Result Date: 07/17/2020 INDICATION: History of bladder cancer with chronic bilateral nephroureteral catheters via ileal conduit. Patient presented to the emergency department with abdominal pain with CT scan demonstrating retraction of the right-sided nephroureteral catheter to the level of the superior aspect the right ureter. As such, patient presents today for fluoroscopic guided exchange of both nephroureteral catheters. EXAM: FLUOROSCOPIC GUIDED BILATERAL NEPHROURETERAL CATHETER EXCHANGE COMPARISON:   Multiple previous fluoroscopic guided nephroureteral catheter exchanges, most recently 5//2 CONTRAST:  A total of 20 cc Isovue-300 was administered into the bilateral collecting system FLUOROSCOPY TIME:  8 minutes, 30 seconds (59 mGy) COMPLICATIONS: None immediate. TECHNIQUE: Informed written consent was obtained from the patient after a discussion of the risks, benefits and alternatives to treatment. Questions regarding the procedure were encouraged and answered. A timeout was performed prior to the initiation of the procedure. External portion of the existing retrograde nephroureteral catheter as well as the surrounding skin was draped in the usual sterile fashion. A sterile drape was applied covering the operative field. Maximum barrier sterile technique with sterile gowns and gloves were used for the procedure. A timeout was performed prior to the initiation of the procedure. Preprocedural spot fluoroscopic image was obtained of the abdomen. Contrast was injected via the right-sided nephrostomy catheter confirming retraction to the level of the superior aspect the right ureter. The external portion of the catheter was cut and cannulated with an Amplatz wire which was advanced through the entirety of the catheter to the level of the right ureter. Note was made of significant incrustation of the right-sided nephroureteral catheter. The nephroureteral catheter was exchanged for a Kumpe catheter which was utilized to manipulate the Amplatz wire to the level of the right renal pelvis. Contrast injection confirmed appropriate positioning. Next, under fluoroscopic guided exchange, the new 12 French, 45 cm nephroureteral catheter is appropriately positioned with end coiled and locked within the right renal pelvis. Contrast injection confirmed appropriate positioning and functionality. Attention was now paid towards the left-sided nephroureteral catheter. Contrast injection demonstrated appropriate positioning with end  coiled and locked within the left renal pelvis. The external portion of nephroureteral catheter was cut and cannulated initially with an Amplatz wire and ultimately with a stiff Glidewire which was advanced through the entirety of the catheter to the level of the left renal pelvis. Note was made of significant encrustation of the nephroureteral catheter Next, over the Amplatz wire, a new slightly larger now 12 French nephroureteral catheter was positioned with end coiled and locked within the left renal pelvis. Contrast injection confirmed appropriate positioning and functionality. The patient's urostomy was reapplied. The patient tolerated the procedure well without immediate postprocedural complication. FINDINGS: Right-sided nephroureteral catheter has been retracted to the level of the superior aspect of the right ureter as was demonstrated on preceding noncontrast CT. After fluoroscopic guided exchange repositioning the right-sided 12 French, 45 cm all-purpose drainage catheter is appropriately positioned with end coiled and locked within the right renal pelvis. After fluoroscopic guided exchange and up sizing, the new slightly larger now 12 French, 45 cm all-purpose drainage catheter is appropriately positioned with end coiled and locked within the left renal pelvis. IMPRESSION: 1. Successful fluoroscopic guided exchange and repositioning of right sided 12 French, 45 cm nephroureteral catheter. 2. Successful fluoroscopic guided exchange and up sizing of now 12 French, 45 cm left-sided nephroureteral catheter. PLAN: Given significant incrustation of the bilateral nephroureteral catheters, the patient  will return for routine fluoroscopic guided exchange in approximately 4 weeks. Electronically Signed   By: Simonne Come M.D.   On: 07/17/2020 17:38   IR EXT NEPHROURETERAL CATH EXCHANGE  Result Date: 07/17/2020 INDICATION: History of bladder cancer with chronic bilateral nephroureteral catheters via ileal conduit.  Patient presented to the emergency department with abdominal pain with CT scan demonstrating retraction of the right-sided nephroureteral catheter to the level of the superior aspect the right ureter. As such, patient presents today for fluoroscopic guided exchange of both nephroureteral catheters. EXAM: FLUOROSCOPIC GUIDED BILATERAL NEPHROURETERAL CATHETER EXCHANGE COMPARISON:  Multiple previous fluoroscopic guided nephroureteral catheter exchanges, most recently 5//2 CONTRAST:  A total of 20 cc Isovue-300 was administered into the bilateral collecting system FLUOROSCOPY TIME:  8 minutes, 30 seconds (59 mGy) COMPLICATIONS: None immediate. TECHNIQUE: Informed written consent was obtained from the patient after a discussion of the risks, benefits and alternatives to treatment. Questions regarding the procedure were encouraged and answered. A timeout was performed prior to the initiation of the procedure. External portion of the existing retrograde nephroureteral catheter as well as the surrounding skin was draped in the usual sterile fashion. A sterile drape was applied covering the operative field. Maximum barrier sterile technique with sterile gowns and gloves were used for the procedure. A timeout was performed prior to the initiation of the procedure. Preprocedural spot fluoroscopic image was obtained of the abdomen. Contrast was injected via the right-sided nephrostomy catheter confirming retraction to the level of the superior aspect the right ureter. The external portion of the catheter was cut and cannulated with an Amplatz wire which was advanced through the entirety of the catheter to the level of the right ureter. Note was made of significant incrustation of the right-sided nephroureteral catheter. The nephroureteral catheter was exchanged for a Kumpe catheter which was utilized to manipulate the Amplatz wire to the level of the right renal pelvis. Contrast injection confirmed appropriate positioning.  Next, under fluoroscopic guided exchange, the new 12 French, 45 cm nephroureteral catheter is appropriately positioned with end coiled and locked within the right renal pelvis. Contrast injection confirmed appropriate positioning and functionality. Attention was now paid towards the left-sided nephroureteral catheter. Contrast injection demonstrated appropriate positioning with end coiled and locked within the left renal pelvis. The external portion of nephroureteral catheter was cut and cannulated initially with an Amplatz wire and ultimately with a stiff Glidewire which was advanced through the entirety of the catheter to the level of the left renal pelvis. Note was made of significant encrustation of the nephroureteral catheter Next, over the Amplatz wire, a new slightly larger now 12 French nephroureteral catheter was positioned with end coiled and locked within the left renal pelvis. Contrast injection confirmed appropriate positioning and functionality. The patient's urostomy was reapplied. The patient tolerated the procedure well without immediate postprocedural complication. FINDINGS: Right-sided nephroureteral catheter has been retracted to the level of the superior aspect of the right ureter as was demonstrated on preceding noncontrast CT. After fluoroscopic guided exchange repositioning the right-sided 12 French, 45 cm all-purpose drainage catheter is appropriately positioned with end coiled and locked within the right renal pelvis. After fluoroscopic guided exchange and up sizing, the new slightly larger now 12 French, 45 cm all-purpose drainage catheter is appropriately positioned with end coiled and locked within the left renal pelvis. IMPRESSION: 1. Successful fluoroscopic guided exchange and repositioning of right sided 12 French, 45 cm nephroureteral catheter. 2. Successful fluoroscopic guided exchange and up sizing of now 12 [place], 45  cm left-sided nephroureteral catheter. PLAN: Given significant  incrustation of the bilateral nephroureteral catheters, the patient will return for routine fluoroscopic guided exchange in approximately 4 weeks. Electronically Signed   By: Simonne Come M.D.   On: 07/17/2020 17:38    Scheduled Meds: Continuous Infusions:  sodium chloride 75 mL/hr at 07/18/20 0518   cefTRIAXone (ROCEPHIN)  IV 1 g (07/18/20 0520)     LOS: 0 days    Time spent: 35 mins    Ilyas Lipsitz, MD [facility]   If 7PM-7AM, please contact night-coverage

## 2020-07-18 NOTE — Progress Notes (Signed)
Urology Inpatient Progress Report  Hydronephrosis [N13.30] Hydronephrosis, right [N13.30] Other hydronephrosis [N13.39]        Intv/Subj: No acute events overnight.  Continuing to have some gross hematuria from her conduit.  Has continued leukocytosis of 23.3 but creatinine has improved to 1.75.  Hemoglobin is stable at 10.0.  Pain seems to be a little bit better today.  Principal Problem:   Hydronephrosis Active Problems:   CKD (chronic kidney disease), stage IV (HCC)  Current Facility-Administered Medications  Medication Dose Route Frequency Provider Last Rate Last Admin   0.9 %  sodium chloride infusion   Intravenous Continuous Rise Patience, MD 75 mL/hr at 07/18/20 0518 New Bag at 07/18/20 0518   acetaminophen (TYLENOL) tablet 650 mg  650 mg Oral Q6H PRN Shawna Clamp, MD   650 mg at 07/18/20 0807   cefTRIAXone (ROCEPHIN) 1 g in sodium chloride 0.9 % 100 mL IVPB  1 g Intravenous Q24H Rise Patience, MD 200 mL/hr at 07/18/20 0520 1 g at 07/18/20 0520   fentaNYL (SUBLIMAZE) injection 12.5 mcg  12.5 mcg Intravenous Q2H PRN Rise Patience, MD   12.5 mcg at 07/17/20 2218   iohexol (OMNIPAQUE) 300 MG/ML solution 25 mL  25 mL Per Tube Once PRN Sandi Mariscal, MD         Objective: Vital: Vitals:   07/17/20 2156 07/18/20 0217 07/18/20 0550 07/18/20 1428  BP: 91/61 (!) 104/46 118/60 (!) 91/51  Pulse: 74 77 71 68  Resp:    15  Temp: 99.2 F (37.3 C) 99.5 F (37.5 C) 98.5 F (36.9 C) 99 F (37.2 C)  TempSrc: Oral Oral Oral Oral  SpO2: 95% 100% 97% 100%  Weight:      Height:       I/Os: I/O last 3 completed shifts: In: 818.1 [I.V.:718.1; IV Piggyback:100] Out: 650 [Urine:650]  Physical Exam:  General: Patient is in no apparent distress Lungs: Normal respiratory effort, chest expands symmetrically. GI: Incisions are c/d/i. The abdomen is soft and nontender without mass.  Conduit pink patent and productive.  Light red urine.  Bilateral stents visible in  the bag. Ext: lower extremities symmetric  Lab Results: Recent Labs    07/17/20 0616 07/18/20 0424 07/18/20 0914  WBC 18.5* 23.3*  --   HGB 12.4 9.7* 10.0*  HCT 37.8 30.1* 30.6*   Recent Labs    07/17/20 0616 07/18/20 0424  NA 138 136  K 3.7 3.2*  CL 100 105  CO2 27 22  GLUCOSE 123* 88  BUN 32* 25*  CREATININE 2.01* 1.75*  CALCIUM 9.4 8.5*   No results for input(s): LABPT, INR in the last 72 hours. No results for input(s): LABURIN in the last 72 hours. Results for orders placed or performed during the hospital encounter of 07/17/20  Resp Panel by RT-PCR (Flu A&B, Covid) Nasopharyngeal Swab     Status: None   Collection Time: 07/17/20  8:36 AM   Specimen: Nasopharyngeal Swab; Nasopharyngeal(NP) swabs in vial transport medium  Result Value Ref Range Status   SARS Coronavirus 2 by RT PCR NEGATIVE NEGATIVE Final    Comment: (NOTE) SARS-CoV-2 target nucleic acids are NOT DETECTED.  The SARS-CoV-2 RNA is generally detectable in upper respiratory specimens during the acute phase of infection. The lowest concentration of SARS-CoV-2 viral copies this assay can detect is 138 copies/mL. A negative result does not preclude SARS-Cov-2 infection and should not be used as the sole basis for treatment or other patient management decisions. A negative  result may occur with  improper specimen collection/handling, submission of specimen other than nasopharyngeal swab, presence of viral mutation(s) within the areas targeted by this assay, and inadequate number of viral copies(<138 copies/mL). A negative result must be combined with clinical observations, patient history, and epidemiological information. The expected result is Negative.  Fact Sheet for Patients:  EntrepreneurPulse.com.au  Fact Sheet for Healthcare Providers:  IncredibleEmployment.be  This test is no t yet approved or cleared by the [place] FDA and  has been authorized for  detection and/or diagnosis of SARS-CoV-2 by FDA under an Emergency Use Authorization (EUA). This EUA will remain  in effect (meaning this test can be used) for the duration of the COVID-19 declaration under Section 564(b)(1) of the Act, 21 U.S.C.section 360bbb-3(b)(1), unless the authorization is terminated  or revoked sooner.       Influenza A by PCR NEGATIVE NEGATIVE Final   Influenza B by PCR NEGATIVE NEGATIVE Final    Comment: (NOTE) The Xpert Xpress SARS-CoV-2/FLU/RSV plus assay is intended as an aid in the diagnosis of influenza from Nasopharyngeal swab specimens and should not be used as a sole basis for treatment. Nasal washings and aspirates are unacceptable for Xpert Xpress SARS-CoV-2/FLU/RSV testing.  Fact Sheet for Patients: EntrepreneurPulse.com.au  Fact Sheet for Healthcare Providers: IncredibleEmployment.be  This test is not yet approved or cleared by the [place] FDA and has been authorized for detection and/or diagnosis of SARS-CoV-2 by FDA under an Emergency Use Authorization (EUA). This EUA will remain in effect (meaning this test can be used) for the duration of the COVID-19 declaration under Section 564(b)(1) of the Act, 21 U.S.C. section 360bbb-3(b)(1), unless the authorization is terminated or revoked.  Performed at [facility], [place] [address], [place], [place] [address]   Urine culture     Status: Abnormal (Preliminary result)   Collection Time: 07/17/20  8:47 AM   Specimen: Urine, Random  Result Value Ref Range Status   Specimen Description   Final    URINE, RANDOM Performed at [facility], [place] [address], [place], [place] [address]    Special Requests   Final    NONE Performed at [facility], [place] [address], [place], [place] [address]    Culture (A)  Final    >=100,000 COLONIES/mL GRAM NEGATIVE RODS SUSCEPTIBILITIES TO FOLLOW CULTURE  REINCUBATED FOR BETTER GROWTH Performed at [facility], [place] [address] [place], [place] [address]    Report Status PENDING  Incomplete    Studies/Results: CT Renal Stone Study  Result Date: 07/17/2020 CLINICAL DATA:  65 year old female with history of cystectomy, ileal conduit, nephroureteral catheters. Right flank pain and dark red drainage from urostomy bag. EXAM: CT ABDOMEN AND PELVIS WITHOUT CONTRAST TECHNIQUE: Multidetector CT imaging of the abdomen and pelvis was performed following the standard protocol without IV contrast. COMPARISON:  Nephroureteral catheter exchange 05/25/2020. CT Abdomen and Pelvis 04/08/2019. FINDINGS: Lower chest: Possible pulmonary hyperinflation but otherwise negative. Hepatobiliary: Negative noncontrast liver and gallbladder. Pancreas: Negative. Spleen: Negative. Adrenals/Urinary Tract: Normal adrenal glands. Progressive left renal atrophy since last year. Resolved left hydronephrosis with a left nephroureteral stent in place. Moderate to severe right hydronephrosis is new since last year along with right side nephromegaly. Right nephroureteral stent has migrated distally compared to last year, with the proximal pigtail at or below the ureteropelvic junction (coronal image 55 today). No calculus is identified along the course of the stent. Ileal conduit otherwise appears unremarkable. Bladder is surgically absent.  Stomach/Bowel: No dilated small or large bowel. Numerous bowel loops in the pelvis. Mild retained stool in the descending colon. Unremarkable stomach and duodenum. Cecum is difficult to delineate. There is a right lower quadrant bowel anastomosis with no adverse features. No free air or free fluid. Vascular/Lymphatic: Aortoiliac calcified atherosclerosis. Normal caliber abdominal aorta. Vascular patency is not evaluated in the absence of IV contrast. No lymphadenopathy identified. Reproductive: Surgically absent. Other: No pelvic free fluid. Musculoskeletal:  Bilateral sacral insufficiency fractures are new since last year but age indeterminate. Similar insufficiency fractures at the pubic symphysis are new since last year. Otherwise Stable visualized osseous structures., including previous proximal left femur ORIF. IMPRESSION: 1. New moderate to severe right hydronephrosis appears related to distal migration of the right nephroureteral stent. No urinary calculus identified, and otherwise unremarkable ileal conduit. 2. Left hydronephrosis seen last year has resolved. Chronic left renal atrophy. 3. Age indeterminate insufficiency fractures of the bilateral sacral ala and pubic symphysis are new since last year. 4. Aortic Atherosclerosis (ICD10-I70.0). Electronically Signed   By: Genevie Ann M.D.   On: 07/17/2020 07:10   IR EXT NEPHROURETERAL CATH EXCHANGE  Result Date: 07/17/2020 INDICATION: History of bladder cancer with chronic bilateral nephroureteral catheters via ileal conduit. Patient presented to the emergency department with abdominal pain with CT scan demonstrating retraction of the right-sided nephroureteral catheter to the level of the superior aspect the right ureter. As such, patient presents today for fluoroscopic guided exchange of both nephroureteral catheters. EXAM: FLUOROSCOPIC GUIDED BILATERAL NEPHROURETERAL CATHETER EXCHANGE COMPARISON:  Multiple previous fluoroscopic guided nephroureteral catheter exchanges, most recently 5//2 CONTRAST:  A total of 20 cc Isovue-300 was administered into the bilateral collecting system FLUOROSCOPY TIME:  8 minutes, 30 seconds (59 mGy) COMPLICATIONS: None immediate. TECHNIQUE: Informed written consent was obtained from the patient after a discussion of the risks, benefits and alternatives to treatment. Questions regarding the procedure were encouraged and answered. A timeout was performed prior to the initiation of the procedure. External portion of the existing retrograde nephroureteral catheter as well as the  surrounding skin was draped in the usual sterile fashion. A sterile drape was applied covering the operative field. Maximum barrier sterile technique with sterile gowns and gloves were used for the procedure. A timeout was performed prior to the initiation of the procedure. Preprocedural spot fluoroscopic image was obtained of the abdomen. Contrast was injected via the right-sided nephrostomy catheter confirming retraction to the level of the superior aspect the right ureter. The external portion of the catheter was cut and cannulated with an Amplatz wire which was advanced through the entirety of the catheter to the level of the right ureter. Note was made of significant incrustation of the right-sided nephroureteral catheter. The nephroureteral catheter was exchanged for a Kumpe catheter which was utilized to manipulate the Amplatz wire to the level of the right renal pelvis. Contrast injection confirmed appropriate positioning. Next, under fluoroscopic guided exchange, the new 12 French, 45 cm nephroureteral catheter is appropriately positioned with end coiled and locked within the right renal pelvis. Contrast injection confirmed appropriate positioning and functionality. Attention was now paid towards the left-sided nephroureteral catheter. Contrast injection demonstrated appropriate positioning with end coiled and locked within the left renal pelvis. The external portion of nephroureteral catheter was cut and cannulated initially with an Amplatz wire and ultimately with a stiff Glidewire which was advanced through the entirety of the catheter to the level of the left renal pelvis. Note was made of significant encrustation of the nephroureteral  catheter Next, over the Amplatz wire, a new slightly larger now 12 French nephroureteral catheter was positioned with end coiled and locked within the left renal pelvis. Contrast injection confirmed appropriate positioning and functionality. The patient's urostomy was  reapplied. The patient tolerated the procedure well without immediate postprocedural complication. FINDINGS: Right-sided nephroureteral catheter has been retracted to the level of the superior aspect of the right ureter as was demonstrated on preceding noncontrast CT. After fluoroscopic guided exchange repositioning the right-sided 12 French, 45 cm all-purpose drainage catheter is appropriately positioned with end coiled and locked within the right renal pelvis. After fluoroscopic guided exchange and up sizing, the new slightly larger now 12 French, 45 cm all-purpose drainage catheter is appropriately positioned with end coiled and locked within the left renal pelvis. IMPRESSION: 1. Successful fluoroscopic guided exchange and repositioning of right sided 12 French, 45 cm nephroureteral catheter. 2. Successful fluoroscopic guided exchange and up sizing of now 12 French, 45 cm left-sided nephroureteral catheter. PLAN: Given significant incrustation of the bilateral nephroureteral catheters, the patient will return for routine fluoroscopic guided exchange in approximately 4 weeks. Electronically Signed   By: Sandi Mariscal M.D.   On: 07/17/2020 17:38   IR EXT NEPHROURETERAL CATH EXCHANGE  Result Date: 07/17/2020 INDICATION: History of bladder cancer with chronic bilateral nephroureteral catheters via ileal conduit. Patient presented to the emergency department with abdominal pain with CT scan demonstrating retraction of the right-sided nephroureteral catheter to the level of the superior aspect the right ureter. As such, patient presents today for fluoroscopic guided exchange of both nephroureteral catheters. EXAM: FLUOROSCOPIC GUIDED BILATERAL NEPHROURETERAL CATHETER EXCHANGE COMPARISON:  Multiple previous fluoroscopic guided nephroureteral catheter exchanges, most recently 5//2 CONTRAST:  A total of 20 cc Isovue-300 was administered into the bilateral collecting system FLUOROSCOPY TIME:  8 minutes, 30 seconds (59  mGy) COMPLICATIONS: None immediate. TECHNIQUE: Informed written consent was obtained from the patient after a discussion of the risks, benefits and alternatives to treatment. Questions regarding the procedure were encouraged and answered. A timeout was performed prior to the initiation of the procedure. External portion of the existing retrograde nephroureteral catheter as well as the surrounding skin was draped in the usual sterile fashion. A sterile drape was applied covering the operative field. Maximum barrier sterile technique with sterile gowns and gloves were used for the procedure. A timeout was performed prior to the initiation of the procedure. Preprocedural spot fluoroscopic image was obtained of the abdomen. Contrast was injected via the right-sided nephrostomy catheter confirming retraction to the level of the superior aspect the right ureter. The external portion of the catheter was cut and cannulated with an Amplatz wire which was advanced through the entirety of the catheter to the level of the right ureter. Note was made of significant incrustation of the right-sided nephroureteral catheter. The nephroureteral catheter was exchanged for a Kumpe catheter which was utilized to manipulate the Amplatz wire to the level of the right renal pelvis. Contrast injection confirmed appropriate positioning. Next, under fluoroscopic guided exchange, the new 12 French, 45 cm nephroureteral catheter is appropriately positioned with end coiled and locked within the right renal pelvis. Contrast injection confirmed appropriate positioning and functionality. Attention was now paid towards the left-sided nephroureteral catheter. Contrast injection demonstrated appropriate positioning with end coiled and locked within the left renal pelvis. The external portion of nephroureteral catheter was cut and cannulated initially with an Amplatz wire and ultimately with a stiff Glidewire which was advanced through the entirety of  the catheter to  the level of the left renal pelvis. Note was made of significant encrustation of the nephroureteral catheter Next, over the Amplatz wire, a new slightly larger now 12 French nephroureteral catheter was positioned with end coiled and locked within the left renal pelvis. Contrast injection confirmed appropriate positioning and functionality. The patient's urostomy was reapplied. The patient tolerated the procedure well without immediate postprocedural complication. FINDINGS: Right-sided nephroureteral catheter has been retracted to the level of the superior aspect of the right ureter as was demonstrated on preceding noncontrast CT. After fluoroscopic guided exchange repositioning the right-sided 12 French, 45 cm all-purpose drainage catheter is appropriately positioned with end coiled and locked within the right renal pelvis. After fluoroscopic guided exchange and up sizing, the new slightly larger now 12 French, 45 cm all-purpose drainage catheter is appropriately positioned with end coiled and locked within the left renal pelvis. IMPRESSION: 1. Successful fluoroscopic guided exchange and repositioning of right sided 12 French, 45 cm nephroureteral catheter. 2. Successful fluoroscopic guided exchange and up sizing of now 12 French, 45 cm left-sided nephroureteral catheter. PLAN: Given significant incrustation of the bilateral nephroureteral catheters, the patient will return for routine fluoroscopic guided exchange in approximately 4 weeks. Electronically Signed   By: Sandi Mariscal M.D.   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  Continue to follow labs.  No further urological intervention at this time.   Link Snuffer, MD Urology 07/18/2020, 4:44 PM

## 2020-07-19 LAB — BASIC METABOLIC PANEL
Anion gap: 10 (ref 5–15)
BUN: 20 mg/dL (ref 8–23)
CO2: 21 mmol/L — ABNORMAL LOW (ref 22–32)
Calcium: 8.2 mg/dL — ABNORMAL LOW (ref 8.9–10.3)
Chloride: 107 mmol/L (ref 98–111)
Creatinine, Ser: 1.84 mg/dL — ABNORMAL HIGH (ref 0.44–1.00)
GFR, Estimated: 30 mL/min — ABNORMAL LOW (ref >60)
Glucose, Bld: 92 mg/dL (ref 70–99)
Potassium: 3.2 mmol/L — ABNORMAL LOW (ref 3.5–5.1)
Sodium: 138 mmol/L (ref 135–145)

## 2020-07-19 LAB — URINALYSIS, ROUTINE W REFLEX MICROSCOPIC
Bilirubin Urine: NEGATIVE
Glucose, UA: 50 mg/dL — AB
Ketones, ur: NEGATIVE mg/dL
Nitrite: POSITIVE — AB
Protein, ur: 100 mg/dL — AB
RBC / HPF: >50 RBC/hpf — ABNORMAL HIGH (ref 0–5)
Specific Gravity, Urine: 1.004 — ABNORMAL LOW (ref 1.005–1.030)
pH: 6 (ref 5.0–8.0)

## 2020-07-19 LAB — HEMOGLOBIN AND HEMATOCRIT, BLOOD
HCT: 30.6 % — ABNORMAL LOW (ref 36.0–46.0)
HCT: 27.9 % — ABNORMAL LOW (ref 36.0–46.0)
Hemoglobin: 9.7 g/dL — ABNORMAL LOW (ref 12.0–15.0)
Hemoglobin: 9.1 g/dL — ABNORMAL LOW (ref 12.0–15.0)

## 2020-07-19 LAB — GLUCOSE, CAPILLARY
Glucose-Capillary: 111 mg/dL — ABNORMAL HIGH (ref 70–99)
Glucose-Capillary: 91 mg/dL (ref 70–99)
Glucose-Capillary: 97 mg/dL (ref 70–99)

## 2020-07-19 LAB — CBC
HCT: 28.2 % — ABNORMAL LOW (ref 36.0–46.0)
Hemoglobin: 9.1 g/dL — ABNORMAL LOW (ref 12.0–15.0)
MCH: 29.4 pg (ref 26.0–34.0)
MCHC: 32.3 g/dL (ref 30.0–36.0)
MCV: 91.3 fL (ref 80.0–100.0)
Platelets: 438 10*3/uL — ABNORMAL HIGH (ref 150–400)
RBC: 3.09 MIL/uL — ABNORMAL LOW (ref 3.87–5.11)
RDW: 15.4 % (ref 11.5–15.5)
WBC: 13.6 10*3/uL — ABNORMAL HIGH (ref 4.0–10.5)
nRBC: 0 % (ref 0.0–0.2)

## 2020-07-19 LAB — URINE CULTURE: Culture: >=100000 — AB

## 2020-07-19 LAB — MAGNESIUM: Magnesium: 1.5 mg/dL — ABNORMAL LOW (ref 1.7–2.4)

## 2020-07-19 LAB — PHOSPHORUS: Phosphorus: 2.8 mg/dL (ref 2.5–4.6)

## 2020-07-19 MED ORDER — TRAZODONE HCL 100 MG PO TABS: 100.0000 mg | ORAL_TABLET | Freq: Every evening | ORAL | Status: DC | PRN

## 2020-07-19 MED ORDER — FUROSEMIDE 40 MG PO TABS: 40.0000 mg | ORAL_TABLET | Freq: Every day | ORAL | Status: DC

## 2020-07-19 MED ORDER — MAGNESIUM SULFATE 2 GM/50ML IV SOLN
2.0000 g | Freq: Once | INTRAVENOUS | Status: AC
  Administered 2020-07-19: 2 g via INTRAVENOUS
  Filled 2020-07-19: qty 50

## 2020-07-19 MED ORDER — POTASSIUM CHLORIDE 20 MEQ PO PACK
40.0000 meq | PACK | Freq: Once | ORAL | Status: AC
  Administered 2020-07-19: 40 meq via ORAL
  Filled 2020-07-19: qty 2

## 2020-07-19 MED ORDER — ARIPIPRAZOLE 10 MG PO TABS
30.0000 mg | ORAL_TABLET | Freq: Every day | ORAL | Status: DC
  Administered 2020-07-19 – 2020-07-20 (×2): 30 mg via ORAL
  Filled 2020-07-19 (×2): qty 3

## 2020-07-19 MED ORDER — ONDANSETRON HCL 4 MG/2ML IJ SOLN: 4.0000 mg | Freq: Four times a day (QID) | INTRAMUSCULAR | Status: DC | PRN

## 2020-07-19 MED ORDER — ALBUTEROL SULFATE (2.5 MG/3ML) 0.083% IN NEBU
3.0000 mL | INHALATION_SOLUTION | Freq: Four times a day (QID) | RESPIRATORY_TRACT | Status: DC | PRN
Start: 2020-07-19 — End: 2020-07-20

## 2020-07-19 MED ORDER — DULOXETINE HCL 30 MG PO CPEP
30.0000 mg | ORAL_CAPSULE | Freq: Every day | ORAL | Status: DC
  Administered 2020-07-19: 30 mg via ORAL
  Filled 2020-07-19: qty 1

## 2020-07-19 MED ORDER — METHOCARBAMOL 500 MG PO TABS: 500.0000 mg | ORAL_TABLET | Freq: Every evening | ORAL | Status: DC | PRN

## 2020-07-19 NOTE — Progress Notes (Signed)
Subjective: Patient reports she is ready to go home.  Hematuria continues to improve.  Objective: Vital signs in last 24 hours: Temp:  [98.6 F (37 C)-100.9 F (38.3 C)] 98.6 F (37 C) (07/03 0517) Pulse Rate:  [64-91] 64 (07/03 0517) Resp:  [14-16] 14 (07/03 0517) BP: (91-128)/(51-63) 100/56 (07/03 0517) SpO2:  [97 %-100 %] 98 % (07/03 0517) Weight:  [62.5 kg] 62.5 kg (07/03 0500)  Intake/Output from previous day: 07/02 0701 - 07/03 0700 In: -  Out: 2575 [Urine:2575] Intake/Output this shift: Total I/O In: -  Out: 325 [Urine:325]  Physical Exam:  Resting comfortably in bed Ileal conduit looks healthy with cola colored urine.  No clots.  Lab Results: Recent Labs    07/18/20 0914 07/19/20 0333 07/19/20 0951  HGB 10.0* 9.1* 9.7*  HCT 30.6* 28.2* 30.6*   BMET Recent Labs    07/18/20 0424 07/19/20 0333  NA 136 138  K 3.2* 3.2*  CL 105 107  CO2 22 21*  GLUCOSE 88 92  BUN 25* 20  CREATININE 1.75* 1.84*  CALCIUM 8.5* 8.2*   No results for input(s): LABPT, INR in the last 72 hours. No results for input(s): LABURIN in the last 72 hours. Results for orders placed or performed during the hospital encounter of 07/17/20  Resp Panel by RT-PCR (Flu A&B, Covid) Nasopharyngeal Swab     Status: None   Collection Time: 07/17/20  8:36 AM   Specimen: Nasopharyngeal Swab; Nasopharyngeal(NP) swabs in vial transport medium  Result Value Ref Range Status   SARS Coronavirus 2 by RT PCR NEGATIVE NEGATIVE Final    Comment: (NOTE) SARS-CoV-2 target nucleic acids are NOT DETECTED.  The SARS-CoV-2 RNA is generally detectable in upper respiratory specimens during the acute phase of infection. The lowest concentration of SARS-CoV-2 viral copies this assay can detect is 138 copies/mL. A negative result does not preclude SARS-Cov-2 infection and should not be used as the sole basis for treatment or other patient management decisions. A negative result may occur with  improper  specimen collection/handling, submission of specimen other than nasopharyngeal swab, presence of viral mutation(s) within the areas targeted by this assay, and inadequate number of viral copies(<138 copies/mL). A negative result must be combined with clinical observations, patient history, and epidemiological information. The expected result is Negative.  Fact Sheet for Patients:  EntrepreneurPulse.com.au  Fact Sheet for Healthcare Providers:  IncredibleEmployment.be  This test is no t yet approved or cleared by the [place] FDA and  has been authorized for detection and/or diagnosis of SARS-CoV-2 by FDA under an Emergency Use Authorization (EUA). This EUA will remain  in effect (meaning this test can be used) for the duration of the COVID-19 declaration under Section 564(b)(1) of the Act, 21 U.S.C.section 360bbb-3(b)(1), unless the authorization is terminated  or revoked sooner.       Influenza A by PCR NEGATIVE NEGATIVE Final   Influenza B by PCR NEGATIVE NEGATIVE Final    Comment: (NOTE) The Xpert Xpress SARS-CoV-2/FLU/RSV plus assay is intended as an aid in the diagnosis of influenza from Nasopharyngeal swab specimens and should not be used as a sole basis for treatment. Nasal washings and aspirates are unacceptable for Xpert Xpress SARS-CoV-2/FLU/RSV testing.  Fact Sheet for Patients: EntrepreneurPulse.com.au  Fact Sheet for Healthcare Providers: IncredibleEmployment.be  This test is not yet approved or cleared by the [place] FDA and has been authorized for detection and/or diagnosis of SARS-CoV-2 by FDA under an Emergency Use Authorization (EUA). This EUA will remain  in effect (meaning this test can be used) for the duration of the COVID-19 declaration under Section 564(b)(1) of the Act, 21 U.S.C. section 360bbb-3(b)(1), unless the authorization is terminated or revoked.  Performed at  [facility], [place] [address], [place], [place] [address]   Urine culture     Status: Abnormal (Preliminary result)   Collection Time: 07/17/20  8:47 AM   Specimen: Urine, Random  Result Value Ref Range Status   Specimen Description URINE, RANDOM  Final   Special Requests NONE  Final   Culture (A)  Final    >=100,000 COLONIES/mL ESCHERICHIA COLI 60,000 COLONIES/mL PROTEUS MIRABILIS SUSCEPTIBILITIES TO FOLLOW    Report Status PENDING  Incomplete   Organism ID, Bacteria ESCHERICHIA COLI (A)  Final      Susceptibility   Escherichia coli - MIC*    AMPICILLIN <=2 SENSITIVE Sensitive     CEFAZOLIN <=4 SENSITIVE Sensitive     CEFEPIME <=0.12 SENSITIVE Sensitive     CEFTRIAXONE <=0.25 SENSITIVE Sensitive     CIPROFLOXACIN <=0.25 SENSITIVE Sensitive     GENTAMICIN <=1 SENSITIVE Sensitive     IMIPENEM <=0.25 SENSITIVE Sensitive     NITROFURANTOIN <=16 SENSITIVE Sensitive     TRIMETH/SULFA <=20 SENSITIVE Sensitive     AMPICILLIN/SULBACTAM <=2 SENSITIVE Sensitive     PIP/TAZO Value in next row Sensitive      <=4 SENSITIVEPerformed at [facility] [address] [place], [place] [address]    * >=100,000 COLONIES/mL ESCHERICHIA COLI    Studies/Results: IR EXT NEPHROURETERAL CATH EXCHANGE  Result Date: 07/17/2020 INDICATION: History of bladder cancer with chronic bilateral nephroureteral catheters via ileal conduit. Patient presented to the emergency department with abdominal pain with CT scan demonstrating retraction of the right-sided nephroureteral catheter to the level of the superior aspect the right ureter. As such, patient presents today for fluoroscopic guided exchange of both nephroureteral catheters. EXAM: FLUOROSCOPIC GUIDED BILATERAL NEPHROURETERAL CATHETER EXCHANGE COMPARISON:  Multiple previous fluoroscopic guided nephroureteral catheter exchanges, most recently 5//2 CONTRAST:  A total of 20 cc Isovue-300 was administered into the bilateral collecting  system FLUOROSCOPY TIME:  8 minutes, 30 seconds (59 mGy) COMPLICATIONS: None immediate. TECHNIQUE: Informed written consent was obtained from the patient after a discussion of the risks, benefits and alternatives to treatment. Questions regarding the procedure were encouraged and answered. A timeout was performed prior to the initiation of the procedure. External portion of the existing retrograde nephroureteral catheter as well as the surrounding skin was draped in the usual sterile fashion. A sterile drape was applied covering the operative field. Maximum barrier sterile technique with sterile gowns and gloves were used for the procedure. A timeout was performed prior to the initiation of the procedure. Preprocedural spot fluoroscopic image was obtained of the abdomen. Contrast was injected via the right-sided nephrostomy catheter confirming retraction to the level of the superior aspect the right ureter. The external portion of the catheter was cut and cannulated with an Amplatz wire which was advanced through the entirety of the catheter to the level of the right ureter. Note was made of significant incrustation of the right-sided nephroureteral catheter. The nephroureteral catheter was exchanged for a Kumpe catheter which was utilized to manipulate the Amplatz wire to the level of the right renal pelvis. Contrast injection confirmed appropriate positioning. Next, under fluoroscopic guided exchange, the new 12 French, 45 cm nephroureteral catheter is appropriately positioned with end coiled and locked within the right renal pelvis. Contrast injection confirmed appropriate positioning and  functionality. Attention was now paid towards the left-sided nephroureteral catheter. Contrast injection demonstrated appropriate positioning with end coiled and locked within the left renal pelvis. The external portion of nephroureteral catheter was cut and cannulated initially with an Amplatz wire and ultimately with a stiff  Glidewire which was advanced through the entirety of the catheter to the level of the left renal pelvis. Note was made of significant encrustation of the nephroureteral catheter Next, over the Amplatz wire, a new slightly larger now 12 French nephroureteral catheter was positioned with end coiled and locked within the left renal pelvis. Contrast injection confirmed appropriate positioning and functionality. The patient's urostomy was reapplied. The patient tolerated the procedure well without immediate postprocedural complication. FINDINGS: Right-sided nephroureteral catheter has been retracted to the level of the superior aspect of the right ureter as was demonstrated on preceding noncontrast CT. After fluoroscopic guided exchange repositioning the right-sided 12 French, 45 cm all-purpose drainage catheter is appropriately positioned with end coiled and locked within the right renal pelvis. After fluoroscopic guided exchange and up sizing, the new slightly larger now 12 French, 45 cm all-purpose drainage catheter is appropriately positioned with end coiled and locked within the left renal pelvis. IMPRESSION: 1. Successful fluoroscopic guided exchange and repositioning of right sided 12 French, 45 cm nephroureteral catheter. 2. Successful fluoroscopic guided exchange and up sizing of now 12 French, 45 cm left-sided nephroureteral catheter. PLAN: Given significant incrustation of the bilateral nephroureteral catheters, the patient will return for routine fluoroscopic guided exchange in approximately 4 weeks. Electronically Signed   By: Sandi Mariscal M.D.   On: 07/17/2020 17:38   IR EXT NEPHROURETERAL CATH EXCHANGE  Result Date: 07/17/2020 INDICATION: History of bladder cancer with chronic bilateral nephroureteral catheters via ileal conduit. Patient presented to the emergency department with abdominal pain with CT scan demonstrating retraction of the right-sided nephroureteral catheter to the level of the superior  aspect the right ureter. As such, patient presents today for fluoroscopic guided exchange of both nephroureteral catheters. EXAM: FLUOROSCOPIC GUIDED BILATERAL NEPHROURETERAL CATHETER EXCHANGE COMPARISON:  Multiple previous fluoroscopic guided nephroureteral catheter exchanges, most recently 5//2 CONTRAST:  A total of 20 cc Isovue-300 was administered into the bilateral collecting system FLUOROSCOPY TIME:  8 minutes, 30 seconds (59 mGy) COMPLICATIONS: None immediate. TECHNIQUE: Informed written consent was obtained from the patient after a discussion of the risks, benefits and alternatives to treatment. Questions regarding the procedure were encouraged and answered. A timeout was performed prior to the initiation of the procedure. External portion of the existing retrograde nephroureteral catheter as well as the surrounding skin was draped in the usual sterile fashion. A sterile drape was applied covering the operative field. Maximum barrier sterile technique with sterile gowns and gloves were used for the procedure. A timeout was performed prior to the initiation of the procedure. Preprocedural spot fluoroscopic image was obtained of the abdomen. Contrast was injected via the right-sided nephrostomy catheter confirming retraction to the level of the superior aspect the right ureter. The external portion of the catheter was cut and cannulated with an Amplatz wire which was advanced through the entirety of the catheter to the level of the right ureter. Note was made of significant incrustation of the right-sided nephroureteral catheter. The nephroureteral catheter was exchanged for a Kumpe catheter which was utilized to manipulate the Amplatz wire to the level of the right renal pelvis. Contrast injection confirmed appropriate positioning. Next, under fluoroscopic guided exchange, the new 12 French, 45 cm nephroureteral catheter is appropriately positioned with  end coiled and locked within the right renal pelvis.  Contrast injection confirmed appropriate positioning and functionality. Attention was now paid towards the left-sided nephroureteral catheter. Contrast injection demonstrated appropriate positioning with end coiled and locked within the left renal pelvis. The external portion of nephroureteral catheter was cut and cannulated initially with an Amplatz wire and ultimately with a stiff Glidewire which was advanced through the entirety of the catheter to the level of the left renal pelvis. Note was made of significant encrustation of the nephroureteral catheter Next, over the Amplatz wire, a new slightly larger now 12 French nephroureteral catheter was positioned with end coiled and locked within the left renal pelvis. Contrast injection confirmed appropriate positioning and functionality. The patient's urostomy was reapplied. The patient tolerated the procedure well without immediate postprocedural complication. FINDINGS: Right-sided nephroureteral catheter has been retracted to the level of the superior aspect of the right ureter as was demonstrated on preceding noncontrast CT. After fluoroscopic guided exchange repositioning the right-sided 12 French, 45 cm all-purpose drainage catheter is appropriately positioned with end coiled and locked within the right renal pelvis. After fluoroscopic guided exchange and up sizing, the new slightly larger now 12 French, 45 cm all-purpose drainage catheter is appropriately positioned with end coiled and locked within the left renal pelvis. IMPRESSION: 1. Successful fluoroscopic guided exchange and repositioning of right sided 12 French, 45 cm nephroureteral catheter. 2. Successful fluoroscopic guided exchange and up sizing of now 12 French, 45 cm left-sided nephroureteral catheter. PLAN: Given significant incrustation of the bilateral nephroureteral catheters, the patient will return for routine fluoroscopic guided exchange in approximately 4 weeks. Electronically Signed   By:  Sandi Mariscal M.D.   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  IR repositioned and placed new nephroureteral stents emanating from the ileal conduit 07/17/2020.  Hematuria continues to improve and is not clinically significant.  Hemoglobin appears stable.  She is clear for discharge from a urologic point of view.  She already has an appointment scheduled with Dr. Claudia Desanctis 07/23/2020 at 3 PM.  I put this on the chart.   LOS: 1 day   Festus Aloe 07/19/2020, 11:13 AM

## 2020-07-19 NOTE — Progress Notes (Signed)
PROGRESS NOTE    Shelia Chan  WGN:[id number] DOB: 02-24-1955 DOA: 07/17/2020 PCP: Ellyn Hack, MD   Brief Narrative:  This 65 years old female with PMH significant of urinary bladder cancer status postsurgical chemoradiation and urostomy and bilateral stent placement followed by Dr. Arita Miss urologist presented in the ED with complaint of increasing pain in the right flank with hematuria and urostomy bag.  Patient reports symptoms has been ongoing for last 24 hours associated with nausea and vomiting.  she denies any fever and chills.  CT renal study shows migration of right-sided nephroureteral stent,  urologist was consulted, patient underwent successful bilateral nephroureteral catheter exchange with upsizing of left-sided drain. She spiked fever twice , pancultured, awaiting results.  Assessment & Plan:   Principal Problem:   Hydronephrosis Active Problems:   CKD (chronic kidney disease), stage IV (HCC)  Right-sided flank pain: Patient presented with right flank pain with hematuria. CT abdomen shows distal migration of the right nephroureteral stent. Urology was consulted,  recommended to continue antibiotics. Patient underwent successful bilateral nephroureteral catheter exchange on 7/1. Continue ceftriaxone, urine cultures grew Proteus and E. coli sensitive to ceftriaxone. She spiked fever 100.9 twice.  Pancultured,  awaiting report.  History of diastolic CHF: Does not appear volume overloaded, not in exacerbation. Continue to monitor.  History of atrial tachycardia: She used to take metoprolol.  Patient states she does not take it now since heart rate is controlled.  Chronic kidney disease stage IV: Serum creatinine seems to be at baseline.  Bladder cancer:  followed by urologist.  Hypotension:  Continue to monitor, still remains low. Continue IV fluids.   DVT prophylaxis: SCDs Code Status: Full code. Family Communication: No family at bed side. Disposition  Plan:  Status is: Inpatient  Remains inpatient appropriate because:Inpatient level of care appropriate due to severity of illness  Dispo: The patient is from: Home              Anticipated d/c is to: Home              Patient currently is not medically stable to d/c.   Difficult to place patient No  Consultants:  Urology  Procedures:  Ureteral Stent exchange Antimicrobials:   Anti-infectives (From admission, onward)    Start     Dose/Rate Route Frequency Ordered Stop   07/18/20 0600  cefTRIAXone (ROCEPHIN) 1 g in sodium chloride 0.9 % 100 mL IVPB        1 g 200 mL/hr over 30 Minutes Intravenous Every 24 hours 07/17/20 0945     07/17/20 0915  cefTRIAXone (ROCEPHIN) 1 g in sodium chloride 0.9 % 100 mL IVPB        1 g 200 mL/hr over 30 Minutes Intravenous  Once 07/17/20 0912 07/17/20 1020        Subjective: Patient was seen and examined at bedside.  Overnight events noted.  She spiked fever 100.9, She has significant amount of blood in the urostomy bag. Hb remains stable.  Objective: Vitals:   07/18/20 1428 07/18/20 2123 07/19/20 0500 07/19/20 0517  BP: (!) 91/51 128/63  (!) 100/56  Pulse: 68 91  64  Resp: 15 16  14   Temp: 99 F (37.2 C) (!) 100.9 F (38.3 C)  98.6 F (37 C)  TempSrc: Oral     SpO2: 100% 97%  98%  Weight:   62.5 kg   Height:        Intake/Output Summary (Last 24 hours) at 07/19/2020 1408 Last  data filed at 07/19/2020 1042 Gross per 24 hour  Intake --  Output 2850 ml  Net -2850 ml   Filed Weights   07/17/20 0605 07/17/20 1757 07/19/20 0500  Weight: 60.3 kg 60.5 kg 62.5 kg    Examination:  General exam: Appears calm and comfortable, not in any acute distress. Respiratory system: Clear to auscultation. Respiratory effort normal. Cardiovascular system: S1 & S2 heard, RRR. No JVD, murmurs, rubs, gallops or clicks. No pedal edema. Gastrointestinal system: Abdomen is nondistended, soft and nontender. No organomegaly or masses felt. Normal bowel  sounds heard.  Bright blood noted in urostomy bag. Central nervous system: Alert and oriented. No focal neurological deficits. Extremities: Symmetric 5 x 5 power.  1+ pitting edema noted. Skin: No rashes, lesions or ulcers Psychiatry: Judgement and insight appear normal. Mood & affect appropriate.     Data Reviewed: I have personally reviewed following labs and imaging studies  CBC: Recent Labs  Lab 07/17/20 0616 07/18/20 0424 07/18/20 0914 07/19/20 0333 07/19/20 0951  WBC 18.5* 23.3*  --  13.6*  --   NEUTROABS 15.4* 20.8*  --   --   --   HGB 12.4 9.7* 10.0* 9.1* 9.7*  HCT 37.8 30.1* 30.6* 28.2* 30.6*  MCV 89.6 91.2  --  91.3  --   PLT 663* 489*  --  438*  --    Basic Metabolic Panel: Recent Labs  Lab 07/17/20 0616 07/18/20 0424 07/19/20 0333  NA 138 136 138  K 3.7 3.2* 3.2*  CL 100 105 107  CO2 27 22 21*  GLUCOSE 123* 88 92  BUN 32* 25* 20  CREATININE 2.01* 1.75* 1.84*  CALCIUM 9.4 8.5* 8.2*  MG  --   --  1.5*  PHOS  --   --  2.8   GFR: Estimated Creatinine Clearance: 25.5 mL/min (A) (by C-G formula based on SCr of 1.84 mg/dL (H)). Liver Function Tests: No results for input(s): AST, ALT, ALKPHOS, BILITOT, PROT, ALBUMIN in the last 168 hours. No results for input(s): LIPASE, AMYLASE in the last 168 hours. No results for input(s): AMMONIA in the last 168 hours. Coagulation Profile: No results for input(s): INR, PROTIME in the last 168 hours. Cardiac Enzymes: No results for input(s): CKTOTAL, CKMB, CKMBINDEX, TROPONINI in the last 168 hours. BNP (last 3 results) No results for input(s): PROBNP in the last 8760 hours. HbA1C: No results for input(s): HGBA1C in the last 72 hours. CBG: Recent Labs  Lab 07/18/20 0222 07/18/20 0745 07/18/20 1656 07/19/20 0031  GLUCAP 104* 92 92 97   Lipid Profile: No results for input(s): CHOL, HDL, LDLCALC, TRIG, CHOLHDL, LDLDIRECT in the last 72 hours. Thyroid Function Tests: No results for input(s): TSH, T4TOTAL, FREET4,  T3FREE, THYROIDAB in the last 72 hours. Anemia Panel: No results for input(s): VITAMINB12, FOLATE, FERRITIN, TIBC, IRON, RETICCTPCT in the last 72 hours. Sepsis Labs: No results for input(s): PROCALCITON, LATICACIDVEN in the last 168 hours.  Recent Results (from the past 240 hour(s))  Resp Panel by RT-PCR (Flu A&B, Covid) Nasopharyngeal Swab     Status: None   Collection Time: 07/17/20  8:36 AM   Specimen: Nasopharyngeal Swab; Nasopharyngeal(NP) swabs in vial transport medium  Result Value Ref Range Status   SARS Coronavirus 2 by RT PCR NEGATIVE NEGATIVE Final    Comment: (NOTE) SARS-CoV-2 target nucleic acids are NOT DETECTED.  The SARS-CoV-2 RNA is generally detectable in upper respiratory specimens during the acute phase of infection. The lowest concentration of SARS-CoV-2 viral copies  this assay can detect is 138 copies/mL. A negative result does not preclude SARS-Cov-2 infection and should not be used as the sole basis for treatment or other patient management decisions. A negative result may occur with  improper specimen collection/handling, submission of specimen other than nasopharyngeal swab, presence of viral mutation(s) within the areas targeted by this assay, and inadequate number of viral copies(<138 copies/mL). A negative result must be combined with clinical observations, patient history, and epidemiological information. The expected result is Negative.  Fact Sheet for Patients:  BloggerCourse.com  Fact Sheet for Healthcare Providers:  SeriousBroker.it  This test is no t yet approved or cleared by the [place] FDA and  has been authorized for detection and/or diagnosis of SARS-CoV-2 by FDA under an Emergency Use Authorization (EUA). This EUA will remain  in effect (meaning this test can be used) for the duration of the COVID-19 declaration under Section 564(b)(1) of the Act, 21 U.S.C.section 360bbb-3(b)(1),  unless the authorization is terminated  or revoked sooner.       Influenza A by PCR NEGATIVE NEGATIVE Final   Influenza B by PCR NEGATIVE NEGATIVE Final    Comment: (NOTE) The Xpert Xpress SARS-CoV-2/FLU/RSV plus assay is intended as an aid in the diagnosis of influenza from Nasopharyngeal swab specimens and should not be used as a sole basis for treatment. Nasal washings and aspirates are unacceptable for Xpert Xpress SARS-CoV-2/FLU/RSV testing.  Fact Sheet for Patients: BloggerCourse.com  Fact Sheet for Healthcare Providers: SeriousBroker.it  This test is not yet approved or cleared by the [place] FDA and has been authorized for detection and/or diagnosis of SARS-CoV-2 by FDA under an Emergency Use Authorization (EUA). This EUA will remain in effect (meaning this test can be used) for the duration of the COVID-19 declaration under Section 564(b)(1) of the Act, 21 U.S.C. section 360bbb-3(b)(1), unless the authorization is terminated or revoked.  Performed at [facility], [address], [place], [place] [address]   Urine culture     Status: Abnormal   Collection Time: 07/17/20  8:47 AM   Specimen: Urine, Random  Result Value Ref Range Status   Specimen Description   Final    URINE, RANDOM Performed at [facility], [address] [place], [place] [address]    Special Requests   Final    NONE Performed at [facility], [address], [place], [place] [address]    Culture (A)  Final    >=100,000 COLONIES/mL ESCHERICHIA COLI 60,000 COLONIES/mL PROTEUS MIRABILIS    Report Status 07/19/2020 FINAL  Final   Organism ID, Bacteria ESCHERICHIA COLI (A)  Final   Organism ID, Bacteria PROTEUS MIRABILIS (A)  Final      Susceptibility   Escherichia coli - MIC*    AMPICILLIN <=2 SENSITIVE Sensitive     CEFAZOLIN <=4 SENSITIVE Sensitive     CEFEPIME <=0.12  SENSITIVE Sensitive     CEFTRIAXONE <=0.25 SENSITIVE Sensitive     CIPROFLOXACIN <=0.25 SENSITIVE Sensitive     GENTAMICIN <=1 SENSITIVE Sensitive     IMIPENEM <=0.25 SENSITIVE Sensitive     NITROFURANTOIN <=16 SENSITIVE Sensitive     TRIMETH/SULFA <=20 SENSITIVE Sensitive     AMPICILLIN/SULBACTAM <=2 SENSITIVE Sensitive     PIP/TAZO <=4 SENSITIVE Sensitive     * >=100,000 COLONIES/mL ESCHERICHIA COLI   Proteus mirabilis - MIC*    AMPICILLIN <=2 SENSITIVE Sensitive     CEFAZOLIN <=4 SENSITIVE Sensitive     CEFEPIME <=0.12 SENSITIVE  Sensitive     CEFTRIAXONE <=0.25 SENSITIVE Sensitive     CIPROFLOXACIN <=0.25 SENSITIVE Sensitive     GENTAMICIN <=1 SENSITIVE Sensitive     IMIPENEM 2 SENSITIVE Sensitive     NITROFURANTOIN 128 RESISTANT Resistant     TRIMETH/SULFA <=20 SENSITIVE Sensitive     AMPICILLIN/SULBACTAM <=2 SENSITIVE Sensitive     PIP/TAZO <=4 SENSITIVE Sensitive     * 60,000 COLONIES/mL PROTEUS MIRABILIS    Radiology Studies: No results found.  Scheduled Meds: Continuous Infusions:  sodium chloride 75 mL/hr at 07/18/20 0518   cefTRIAXone (ROCEPHIN)  IV 1 g (07/19/20 0442)     LOS: 1 day    Time spent: 25 mins    Kealy Lewter, MD [facility]   If 7PM-7AM, please contact night-coverage

## 2020-07-19 NOTE — Plan of Care (Signed)

## 2020-07-20 LAB — CBC
HCT: 28 % — ABNORMAL LOW (ref 36.0–46.0)
Hemoglobin: 8.5 g/dL — ABNORMAL LOW (ref 12.0–15.0)
MCH: 29.1 pg (ref 26.0–34.0)
MCHC: 30.4 g/dL (ref 30.0–36.0)
MCV: 95.9 fL (ref 80.0–100.0)
Platelets: 369 10*3/uL (ref 150–400)
RBC: 2.92 MIL/uL — ABNORMAL LOW (ref 3.87–5.11)
RDW: 15.4 % (ref 11.5–15.5)
WBC: 10.4 10*3/uL (ref 4.0–10.5)
nRBC: 0 % (ref 0.0–0.2)

## 2020-07-20 LAB — BASIC METABOLIC PANEL
Anion gap: 8 (ref 5–15)
BUN: 18 mg/dL (ref 8–23)
CO2: 19 mmol/L — ABNORMAL LOW (ref 22–32)
Calcium: 8.1 mg/dL — ABNORMAL LOW (ref 8.9–10.3)
Chloride: 108 mmol/L (ref 98–111)
Creatinine, Ser: 1.69 mg/dL — ABNORMAL HIGH (ref 0.44–1.00)
GFR, Estimated: 33 mL/min — ABNORMAL LOW (ref >60)
Glucose, Bld: 95 mg/dL (ref 70–99)
Potassium: 3.7 mmol/L (ref 3.5–5.1)
Sodium: 135 mmol/L (ref 135–145)

## 2020-07-20 LAB — HEMOGLOBIN AND HEMATOCRIT, BLOOD
HCT: 29 % — ABNORMAL LOW (ref 36.0–46.0)
Hemoglobin: 9.3 g/dL — ABNORMAL LOW (ref 12.0–15.0)

## 2020-07-20 LAB — PHOSPHORUS: Phosphorus: 3.1 mg/dL (ref 2.5–4.6)

## 2020-07-20 LAB — MAGNESIUM: Magnesium: 1.9 mg/dL (ref 1.7–2.4)

## 2020-07-20 LAB — GLUCOSE, CAPILLARY: Glucose-Capillary: 126 mg/dL — ABNORMAL HIGH (ref 70–99)

## 2020-07-20 MED ORDER — FLORANEX PO PACK
1.0000 g | PACK | Freq: Three times a day (TID) | ORAL | Status: AC
  Administered 2020-07-20 (×3): 1 g via ORAL
  Filled 2020-07-20 (×3): qty 1

## 2020-07-20 MED ORDER — BACID PO TABS: 2.0000 | ORAL_TABLET | Freq: Three times a day (TID) | ORAL | Status: DC

## 2020-07-20 MED ORDER — AMOXICILLIN-POT CLAVULANATE 875-125 MG PO TABS: 1.0000 | ORAL_TABLET | Freq: Two times a day (BID) | ORAL | 0 refills | Status: AC

## 2020-07-20 NOTE — Progress Notes (Signed)
Lab draw complete waiting results. SRP, RN

## 2020-07-20 NOTE — TOC Progression Note (Signed)
Transition of Care [facility]) - Progression Note    Patient Details  Name: Shelia Chan MRN: [id number] Date of Birth: 23-Mar-1955  [facility] CM/SW Contact  Purcell Mouton, RN Phone Number: 07/20/2020, 1:13 PM  Clinical Narrative:    Pt from home and will return with no needs.    Expected Discharge Plan: Home/Self Care Barriers to Discharge: No Barriers Identified  Expected Discharge Plan and Services Expected Discharge Plan: Home/Self Care       Living arrangements for the past 2 months: Single Family Home                                       Social Determinants of Health (SDOH) Interventions    Readmission Risk Interventions No flowsheet data found.

## 2020-07-20 NOTE — Discharge Instructions (Signed)
Advised to follow-up with urology as scheduled. Advised to take Augmentin 875 mg twice daily for next 6 days to complete total 10-day treatment for UTI.

## 2020-07-20 NOTE — Plan of Care (Signed)

## 2020-07-20 NOTE — Progress Notes (Signed)
Md reviewed results, ok to D/c pt. Pt discharged to home, instructions reviewed with pts. Acknowledged understandings of instructions. SRP, RN

## 2020-07-20 NOTE — Discharge Summary (Signed)
Physician Discharge Summary  Shardee Dieu UXN:[id number] DOB: March 25, 1955 DOA: 07/17/2020  PCP: [name], MD  Admit date: 07/17/2020  Discharge date: 07/20/2020  Admitted From: Home.  Disposition:  Home.  Recommendations for Outpatient Follow-up:  Follow up with PCP in 1-2 weeks. Please obtain BMP/CBC in one week. Advised to follow-up with urology as scheduled. Advised to take Augmentin 875 mg twice daily for next 6 days to complete total 10-day treatment for UTI.  Home Health: None.  Equipment/Devices:None  Discharge Condition: Good. CODE STATUS:Full code Diet recommendation: Heart Healthy   Brief [facility] Course : This 65 years old female with PMH significant of squamous cell carcinoma of the urethra, status post radical cystectomy, urethrectomy, and creation of ileal conduit, She has recent bilateral stent placement followed by Dr. Claudia Desanctis urologist presented in the ED with complaint of increasing pain in the right flank with hematuria in urostomy bag.  Patient reports symptoms has been ongoing for last 24 hours associated with nausea and vomiting. She denies any fever and chills. CT renal study shows migration of right-sided nephroureteral stent, She was admitted for possible UTI and started on antibiotics.  Urologist was consulted, Patient underwent successful bilateral nephroureteral catheter exchange with upsizing of left-sided stent.  Patient was continued on IV antibiotics.  Urine culture grew E. coli and Pseudomonas pansensitive.  Patient hemoglobin remained stable.  Patient spiked fever 101.6 despite being on ceftriaxone.  Repeat blood cultures; no growth so far,  urine culture, unremarkable.  Patient is cleared from urology to be discharged and follow-up outpatient.  Patient remained afebrile for 24 hours before discharge. Hemoglobin remains stable, Hematuria improved.  Patient is being discharged home and advised to take Augmentin twice a day for 6 days to complete  total 10-day treatment.  She was managed for below problems.  Discharge Diagnoses:  Principal Problem:   Hydronephrosis Active Problems:   CKD (chronic kidney disease), stage IV (HCC)  Right-sided flank pain: > Improved. Patient presented with right flank pain with hematuria. CT abdomen shows distal migration of the right nephroureteral stent. Urology was consulted,  recommended to continue antibiotics. Patient underwent successful bilateral nephroureteral catheter exchange on 7/1. Continue ceftriaxone, urine cultures grew Proteus and E. coli sensitive to ceftriaxone. She spiked fever 100.9 twice.  Pancultured,  report no growth. Patient is cleared from urology,  recommended total 10 days of antibiotics.   History of diastolic CHF: Does not appear volume overloaded, not in exacerbation. Continue to monitor.   History of atrial tachycardia: She used to take metoprolol.  Patient states she does not take it now since heart rate is controlled.   Chronic kidney disease stage IV: Serum creatinine seems to be at baseline.   Bladder cancer:  followed by urologist.   Hypotension: Continue to monitor, still remains low. Blood pressure improved.  Discharge Instructions  Discharge Instructions     Call MD for:  difficulty breathing, headache or visual disturbances   Complete by: As directed    Call MD for:  persistant dizziness or light-headedness   Complete by: As directed    Call MD for:  persistant nausea and vomiting   Complete by: As directed    Diet - low sodium heart healthy   Complete by: As directed    Diet Carb Modified   Complete by: As directed    Discharge instructions   Complete by: As directed    Advised to follow-up with primary care physician in 1 week. Advised to follow-up with urology  as scheduled. Advised to take Augmentin 875 mg twice daily for next 6 days to complete total 10-day treatment for UTI.   Increase activity slowly   Complete by: As directed        Allergies as of 07/20/2020       Reactions   Metoprolol Shortness Of Breath        Medication List     STOP taking these medications    metoprolol succinate 50 MG 24 hr tablet Commonly known as: Toprol XL       TAKE these medications    acetaminophen 500 MG tablet Commonly known as: TYLENOL Take 500-1,000 mg by mouth every 6 (six) hours as needed for mild pain or headache.   albuterol 108 (90 Base) MCG/ACT inhaler Commonly known as: VENTOLIN HFA Inhale 1 puff into the lungs every 6 (six) hours as needed for shortness of breath.   amoxicillin-clavulanate 875-125 MG tablet Commonly known as: Augmentin Take 1 tablet by mouth 2 (two) times daily for 6 days.   ARIPiprazole 30 MG tablet Commonly known as: ABILIFY Take 30 mg by mouth daily.   aspirin EC 81 MG tablet Take 81 mg by mouth daily.   atorvastatin 10 MG tablet Commonly known as: LIPITOR Take 10 mg by mouth at bedtime.   calcitRIOL 0.25 MCG capsule Commonly known as: ROCALTROL Take 0.25 mcg by mouth daily.   DULoxetine 30 MG capsule Commonly known as: CYMBALTA Take 30 mg by mouth at bedtime.   famotidine 20 MG tablet Commonly known as: PEPCID Take 20 mg by mouth daily as needed for heartburn.   furosemide 40 MG tablet Commonly known as: LASIX Take 1 tablet (40 mg total) by mouth daily.   methocarbamol 500 MG tablet Commonly known as: ROBAXIN Take 500 mg by mouth at bedtime as needed (restless legs/night kicks).   MULTIVITAMIN ADULT PO Take 1 tablet by mouth daily.   potassium chloride SA 20 MEQ tablet Commonly known as: KLOR-CON Take 1 tablet (20 mEq total) by mouth daily.   traZODone 100 MG tablet Commonly known as: DESYREL Take 100 mg by mouth at bedtime as needed for sleep.   VITAMIN D3-VITAMIN C PO Take 1 each by mouth daily. Gummy vitamin        Follow-up Information     Robley Fries, MD Follow up on 07/23/2020.   Specialty: Urology Why: at 3:00 pm Contact  information: [address] [place] [address] [phone]         [name], MD Follow up in 1 week(s).   Specialty: Family Medicine Contact information: [address] [place] [address] [phone]         Geralynn Rile, MD .   Specialties: Cardiology, Internal Medicine, Radiology Contact information: [place] [address] [phone]                Allergies  Allergen Reactions   Metoprolol Shortness Of Breath    Consultations: Urology   Procedures/Studies: CT Renal Stone Study  Result Date: 07/17/2020 CLINICAL DATA:  65 year old female with history of cystectomy, ileal conduit, nephroureteral catheters. Right flank pain and dark red drainage from urostomy bag. EXAM: CT ABDOMEN AND PELVIS WITHOUT CONTRAST TECHNIQUE: Multidetector CT imaging of the abdomen and pelvis was performed following the standard protocol without IV contrast. COMPARISON:  Nephroureteral catheter exchange 05/25/2020. CT Abdomen and Pelvis 04/08/2019. FINDINGS: Lower chest: Possible pulmonary hyperinflation but otherwise negative. Hepatobiliary: Negative noncontrast liver and gallbladder.  Pancreas: Negative. Spleen: Negative. Adrenals/Urinary Tract: Normal adrenal glands. Progressive left renal atrophy since last year. Resolved left hydronephrosis with a left nephroureteral stent in place. Moderate to severe right hydronephrosis is new since last year along with right side nephromegaly. Right nephroureteral stent has migrated distally compared to last year, with the proximal pigtail at or below the ureteropelvic junction (coronal image 55 today). No calculus is identified along the course of the stent. Ileal conduit otherwise appears unremarkable. Bladder is surgically absent. Stomach/Bowel: No dilated small or large bowel. Numerous bowel loops in the pelvis. Mild retained stool in the descending colon. Unremarkable stomach and duodenum. Cecum  is difficult to delineate. There is a right lower quadrant bowel anastomosis with no adverse features. No free air or free fluid. Vascular/Lymphatic: Aortoiliac calcified atherosclerosis. Normal caliber abdominal aorta. Vascular patency is not evaluated in the absence of IV contrast. No lymphadenopathy identified. Reproductive: Surgically absent. Other: No pelvic free fluid. Musculoskeletal: Bilateral sacral insufficiency fractures are new since last year but age indeterminate. Similar insufficiency fractures at the pubic symphysis are new since last year. Otherwise Stable visualized osseous structures., including previous proximal left femur ORIF. IMPRESSION: 1. New moderate to severe right hydronephrosis appears related to distal migration of the right nephroureteral stent. No urinary calculus identified, and otherwise unremarkable ileal conduit. 2. Left hydronephrosis seen last year has resolved. Chronic left renal atrophy. 3. Age indeterminate insufficiency fractures of the bilateral sacral ala and pubic symphysis are new since last year. 4. Aortic Atherosclerosis (ICD10-I70.0). Electronically Signed   By: Genevie Ann M.D.   On: 07/17/2020 07:10   IR EXT NEPHROURETERAL CATH EXCHANGE  Result Date: 07/17/2020 INDICATION: History of bladder cancer with chronic bilateral nephroureteral catheters via ileal conduit. Patient presented to the emergency department with abdominal pain with CT scan demonstrating retraction of the right-sided nephroureteral catheter to the level of the superior aspect the right ureter. As such, patient presents today for fluoroscopic guided exchange of both nephroureteral catheters. EXAM: FLUOROSCOPIC GUIDED BILATERAL NEPHROURETERAL CATHETER EXCHANGE COMPARISON:  Multiple previous fluoroscopic guided nephroureteral catheter exchanges, most recently 5//2 CONTRAST:  A total of 20 cc Isovue-300 was administered into the bilateral collecting system FLUOROSCOPY TIME:  8 minutes, 30 seconds (59  mGy) COMPLICATIONS: None immediate. TECHNIQUE: Informed written consent was obtained from the patient after a discussion of the risks, benefits and alternatives to treatment. Questions regarding the procedure were encouraged and answered. A timeout was performed prior to the initiation of the procedure. External portion of the existing retrograde nephroureteral catheter as well as the surrounding skin was draped in the usual sterile fashion. A sterile drape was applied covering the operative field. Maximum barrier sterile technique with sterile gowns and gloves were used for the procedure. A timeout was performed prior to the initiation of the procedure. Preprocedural spot fluoroscopic image was obtained of the abdomen. Contrast was injected via the right-sided nephrostomy catheter confirming retraction to the level of the superior aspect the right ureter. The external portion of the catheter was cut and cannulated with an Amplatz wire which was advanced through the entirety of the catheter to the level of the right ureter. Note was made of significant incrustation of the right-sided nephroureteral catheter. The nephroureteral catheter was exchanged for a Kumpe catheter which was utilized to manipulate the Amplatz wire to the level of the right renal pelvis. Contrast injection confirmed appropriate positioning. Next, under fluoroscopic guided exchange, the new 12 French, 45 cm nephroureteral catheter is appropriately positioned with end coiled and  locked within the right renal pelvis. Contrast injection confirmed appropriate positioning and functionality. Attention was now paid towards the left-sided nephroureteral catheter. Contrast injection demonstrated appropriate positioning with end coiled and locked within the left renal pelvis. The external portion of nephroureteral catheter was cut and cannulated initially with an Amplatz wire and ultimately with a stiff Glidewire which was advanced through the entirety of  the catheter to the level of the left renal pelvis. Note was made of significant encrustation of the nephroureteral catheter Next, over the Amplatz wire, a new slightly larger now 12 French nephroureteral catheter was positioned with end coiled and locked within the left renal pelvis. Contrast injection confirmed appropriate positioning and functionality. The patient's urostomy was reapplied. The patient tolerated the procedure well without immediate postprocedural complication. FINDINGS: Right-sided nephroureteral catheter has been retracted to the level of the superior aspect of the right ureter as was demonstrated on preceding noncontrast CT. After fluoroscopic guided exchange repositioning the right-sided 12 French, 45 cm all-purpose drainage catheter is appropriately positioned with end coiled and locked within the right renal pelvis. After fluoroscopic guided exchange and up sizing, the new slightly larger now 12 French, 45 cm all-purpose drainage catheter is appropriately positioned with end coiled and locked within the left renal pelvis. IMPRESSION: 1. Successful fluoroscopic guided exchange and repositioning of right sided 12 French, 45 cm nephroureteral catheter. 2. Successful fluoroscopic guided exchange and up sizing of now 12 French, 45 cm left-sided nephroureteral catheter. PLAN: Given significant incrustation of the bilateral nephroureteral catheters, the patient will return for routine fluoroscopic guided exchange in approximately 4 weeks. Electronically Signed   By: Sandi Mariscal M.D.   On: 07/17/2020 17:38   IR EXT NEPHROURETERAL CATH EXCHANGE  Result Date: 07/17/2020 INDICATION: History of bladder cancer with chronic bilateral nephroureteral catheters via ileal conduit. Patient presented to the emergency department with abdominal pain with CT scan demonstrating retraction of the right-sided nephroureteral catheter to the level of the superior aspect the right ureter. As such, patient presents  today for fluoroscopic guided exchange of both nephroureteral catheters. EXAM: FLUOROSCOPIC GUIDED BILATERAL NEPHROURETERAL CATHETER EXCHANGE COMPARISON:  Multiple previous fluoroscopic guided nephroureteral catheter exchanges, most recently 5//2 CONTRAST:  A total of 20 cc Isovue-300 was administered into the bilateral collecting system FLUOROSCOPY TIME:  8 minutes, 30 seconds (59 mGy) COMPLICATIONS: None immediate. TECHNIQUE: Informed written consent was obtained from the patient after a discussion of the risks, benefits and alternatives to treatment. Questions regarding the procedure were encouraged and answered. A timeout was performed prior to the initiation of the procedure. External portion of the existing retrograde nephroureteral catheter as well as the surrounding skin was draped in the usual sterile fashion. A sterile drape was applied covering the operative field. Maximum barrier sterile technique with sterile gowns and gloves were used for the procedure. A timeout was performed prior to the initiation of the procedure. Preprocedural spot fluoroscopic image was obtained of the abdomen. Contrast was injected via the right-sided nephrostomy catheter confirming retraction to the level of the superior aspect the right ureter. The external portion of the catheter was cut and cannulated with an Amplatz wire which was advanced through the entirety of the catheter to the level of the right ureter. Note was made of significant incrustation of the right-sided nephroureteral catheter. The nephroureteral catheter was exchanged for a Kumpe catheter which was utilized to manipulate the Amplatz wire to the level of the right renal pelvis. Contrast injection confirmed appropriate positioning. Next, under fluoroscopic guided exchange,  the new 12 French, 45 cm nephroureteral catheter is appropriately positioned with end coiled and locked within the right renal pelvis. Contrast injection confirmed appropriate positioning  and functionality. Attention was now paid towards the left-sided nephroureteral catheter. Contrast injection demonstrated appropriate positioning with end coiled and locked within the left renal pelvis. The external portion of nephroureteral catheter was cut and cannulated initially with an Amplatz wire and ultimately with a stiff Glidewire which was advanced through the entirety of the catheter to the level of the left renal pelvis. Note was made of significant encrustation of the nephroureteral catheter Next, over the Amplatz wire, a new slightly larger now 12 French nephroureteral catheter was positioned with end coiled and locked within the left renal pelvis. Contrast injection confirmed appropriate positioning and functionality. The patient's urostomy was reapplied. The patient tolerated the procedure well without immediate postprocedural complication. FINDINGS: Right-sided nephroureteral catheter has been retracted to the level of the superior aspect of the right ureter as was demonstrated on preceding noncontrast CT. After fluoroscopic guided exchange repositioning the right-sided 12 French, 45 cm all-purpose drainage catheter is appropriately positioned with end coiled and locked within the right renal pelvis. After fluoroscopic guided exchange and up sizing, the new slightly larger now 12 French, 45 cm all-purpose drainage catheter is appropriately positioned with end coiled and locked within the left renal pelvis. IMPRESSION: 1. Successful fluoroscopic guided exchange and repositioning of right sided 12 French, 45 cm nephroureteral catheter. 2. Successful fluoroscopic guided exchange and up sizing of now 12 French, 45 cm left-sided nephroureteral catheter. PLAN: Given significant incrustation of the bilateral nephroureteral catheters, the patient will return for routine fluoroscopic guided exchange in approximately 4 weeks. Electronically Signed   By: Sandi Mariscal M.D.   On: 07/17/2020 17:38       Subjective: Patient was seen and examined at bedside.  Overnight events noted.  Patient reports feeling better, hemoglobin remained stable.  Patient remains afebrile for 24 hours.  Patient is being discharged home  Discharge Exam: Vitals:   07/19/20 2014 07/20/20 0519  BP: (!) 95/49 111/66  Pulse: 67 (!) 57  Resp: 14 16  Temp: 99.2 F (37.3 C) 98.7 F (37.1 C)  SpO2: 93% 98%   Vitals:   07/19/20 1700 07/19/20 2014 07/20/20 0500 07/20/20 0519  BP:  (!) 95/49  111/66  Pulse:  67  (!) 57  Resp:  14  16  Temp: 98.9 F (37.2 C) 99.2 F (37.3 C)  98.7 F (37.1 C)  TempSrc: Oral Oral  Oral  SpO2:  93%  98%  Weight:   64.9 kg   Height:        General: Pt is alert, awake, not in acute distress Cardiovascular: RRR, S1/S2 +, no rubs, no gallops Respiratory: CTA bilaterally, no wheezing, no rhonchi Abdominal: Soft, NT, ND, bowel sounds + Extremities: no edema, no cyanosis    The results of significant diagnostics from this hospitalization (including imaging, microbiology, ancillary and laboratory) are listed below for reference.     Microbiology: Recent Results (from the past 240 hour(s))  Resp Panel by RT-PCR (Flu A&B, Covid) Nasopharyngeal Swab     Status: None   Collection Time: 07/17/20  8:36 AM   Specimen: Nasopharyngeal Swab; Nasopharyngeal(NP) swabs in vial transport medium  Result Value Ref Range Status   SARS Coronavirus 2 by RT PCR NEGATIVE NEGATIVE Final    Comment: (NOTE) SARS-CoV-2 target nucleic acids are NOT DETECTED.  The SARS-CoV-2 RNA is generally detectable in upper respiratory  specimens during the acute phase of infection. The lowest concentration of SARS-CoV-2 viral copies this assay can detect is 138 copies/mL. A negative result does not preclude SARS-Cov-2 infection and should not be used as the sole basis for treatment or other patient management decisions. A negative result may occur with  improper specimen collection/handling, submission  of specimen other than nasopharyngeal swab, presence of viral mutation(s) within the areas targeted by this assay, and inadequate number of viral copies(<138 copies/mL). A negative result must be combined with clinical observations, patient history, and epidemiological information. The expected result is Negative.  Fact Sheet for Patients:  EntrepreneurPulse.com.au  Fact Sheet for Healthcare Providers:  IncredibleEmployment.be  This test is no t yet approved or cleared by the [place] FDA and  has been authorized for detection and/or diagnosis of SARS-CoV-2 by FDA under an Emergency Use Authorization (EUA). This EUA will remain  in effect (meaning this test can be used) for the duration of the COVID-19 declaration under Section 564(b)(1) of the Act, 21 U.S.C.section 360bbb-3(b)(1), unless the authorization is terminated  or revoked sooner.       Influenza A by PCR NEGATIVE NEGATIVE Final   Influenza B by PCR NEGATIVE NEGATIVE Final    Comment: (NOTE) The Xpert Xpress SARS-CoV-2/FLU/RSV plus assay is intended as an aid in the diagnosis of influenza from Nasopharyngeal swab specimens and should not be used as a sole basis for treatment. Nasal washings and aspirates are unacceptable for Xpert Xpress SARS-CoV-2/FLU/RSV testing.  Fact Sheet for Patients: EntrepreneurPulse.com.au  Fact Sheet for Healthcare Providers: IncredibleEmployment.be  This test is not yet approved or cleared by the [place] FDA and has been authorized for detection and/or diagnosis of SARS-CoV-2 by FDA under an Emergency Use Authorization (EUA). This EUA will remain in effect (meaning this test can be used) for the duration of the COVID-19 declaration under Section 564(b)(1) of the Act, 21 U.S.C. section 360bbb-3(b)(1), unless the authorization is terminated or revoked.  Performed at [facility], [place]  [address] [place], [place] [address]   Urine culture     Status: Abnormal   Collection Time: 07/17/20  8:47 AM   Specimen: Urine, Random  Result Value Ref Range Status   Specimen Description   Final    URINE, RANDOM Performed at [place] [address], [place], [place] [address]    Special Requests   Final    NONE Performed at [facility], [place] [address], [place], [place] [address]    Culture (A)  Final    >=100,000 COLONIES/mL ESCHERICHIA COLI 60,000 COLONIES/mL PROTEUS MIRABILIS    Report Status 07/19/2020 FINAL  Final   Organism ID, Bacteria ESCHERICHIA COLI (A)  Final   Organism ID, Bacteria PROTEUS MIRABILIS (A)  Final      Susceptibility   Escherichia coli - MIC*    AMPICILLIN <=2 SENSITIVE Sensitive     CEFAZOLIN <=4 SENSITIVE Sensitive     CEFEPIME <=0.12 SENSITIVE Sensitive     CEFTRIAXONE <=0.25 SENSITIVE Sensitive     CIPROFLOXACIN <=0.25 SENSITIVE Sensitive     GENTAMICIN <=1 SENSITIVE Sensitive     IMIPENEM <=0.25 SENSITIVE Sensitive     NITROFURANTOIN <=16 SENSITIVE Sensitive     TRIMETH/SULFA <=20 SENSITIVE Sensitive     AMPICILLIN/SULBACTAM <=2 SENSITIVE Sensitive     PIP/TAZO <=4 SENSITIVE Sensitive     * >=100,000 COLONIES/mL ESCHERICHIA COLI   Proteus mirabilis - MIC*    AMPICILLIN <=2 SENSITIVE Sensitive  CEFAZOLIN <=4 SENSITIVE Sensitive     CEFEPIME <=0.12 SENSITIVE Sensitive     CEFTRIAXONE <=0.25 SENSITIVE Sensitive     CIPROFLOXACIN <=0.25 SENSITIVE Sensitive     GENTAMICIN <=1 SENSITIVE Sensitive     IMIPENEM 2 SENSITIVE Sensitive     NITROFURANTOIN 128 RESISTANT Resistant     TRIMETH/SULFA <=20 SENSITIVE Sensitive     AMPICILLIN/SULBACTAM <=2 SENSITIVE Sensitive     PIP/TAZO <=4 SENSITIVE Sensitive     * 60,000 COLONIES/mL PROTEUS MIRABILIS  Culture, Urine     Status: Abnormal (Preliminary result)   Collection Time: 07/19/20  1:20 PM   Specimen: Urine, Clean Catch  Result Value Ref Range  Status   Specimen Description   Final    URINE, CLEAN CATCH Performed at [facility], [place] [address], [place], [place] [address]    Special Requests   Final    Normal Performed at [facility], [place] [address] [place], [place] [address]    Culture (A)  Final    80,000 COLONIES/mL ESCHERICHIA COLI 40,000 COLONIES/mL ENTEROCOCCUS FAECALIS SUSCEPTIBILITIES TO FOLLOW Performed at [facility], [place] [address], [place], [place] [address]    Report Status PENDING  Incomplete  Culture, blood (routine x 2)     Status: None (Preliminary result)   Collection Time: 07/19/20  1:49 PM   Specimen: BLOOD  Result Value Ref Range Status   Specimen Description   Final    BLOOD BLOOD LEFT FOREARM Performed at  [address], [place], [place] [address]    Special Requests   Final    BOTTLES DRAWN AEROBIC ONLY Blood Culture adequate volume Performed at [place] [address], [place], [place] [address]    Culture   Final    NO GROWTH < 24 HOURS Performed at [facility] [address] [place], [place] [address]    Report Status PENDING  Incomplete  Culture, blood (routine x 2)     Status: None (Preliminary result)   Collection Time: 07/19/20  1:49 PM   Specimen: BLOOD  Result Value Ref Range Status   Specimen Description   Final    BLOOD BLOOD RIGHT HAND Performed at [facility] [address], [place], [place] [address]    Special Requests   Final    BOTTLES DRAWN AEROBIC AND ANAEROBIC Blood Culture adequate volume Performed at [facility] [address] [place], [place] [address]    Culture   Final    NO GROWTH < 24 HOURS Performed at [facility] [address], [place],  [address]    Report Status PENDING  Incomplete     Labs: BNP (last 3 results) No results for input(s): BNP in the last 8760 hours. Basic Metabolic  Panel: Recent Labs  Lab 07/17/20 0616 07/18/20 0424 07/19/20 0333 07/20/20 0408  NA 138 136 138 135  K 3.7 3.2* 3.2* 3.7  CL 100 105 107 108  CO2 27 22 21* 19*  GLUCOSE 123* 88 92 95  BUN 32* 25* 20 18  CREATININE 2.01* 1.75* 1.84* 1.69*  CALCIUM 9.4 8.5* 8.2* 8.1*  MG  --   --  1.5* 1.9  PHOS  --   --  2.8 3.1   Liver Function Tests: No results for input(s): AST, ALT, ALKPHOS, BILITOT, PROT, ALBUMIN in the last 168 hours. No results for input(s): LIPASE, AMYLASE in the last 168 hours. No  results for input(s): AMMONIA in the last 168 hours. CBC: Recent Labs  Lab 07/17/20 0616 07/18/20 0424 07/18/20 0914 07/19/20 0333 07/19/20 0951 07/19/20 1835 07/20/20 0408 07/20/20 1232  WBC 18.5* 23.3*  --  13.6*  --   --  10.4  --   NEUTROABS 15.4* 20.8*  --   --   --   --   --   --   HGB 12.4 9.7*   < > 9.1* 9.7* 9.1* 8.5* 9.3*  HCT 37.8 30.1*   < > 28.2* 30.6* 27.9* 28.0* 29.0*  MCV 89.6 91.2  --  91.3  --   --  95.9  --   PLT 663* 489*  --  438*  --   --  369  --    < > = values in this interval not displayed.   Cardiac Enzymes: No results for input(s): CKTOTAL, CKMB, CKMBINDEX, TROPONINI in the last 168 hours. BNP: Invalid input(s): POCBNP CBG: Recent Labs  Lab 07/18/20 1656 07/19/20 0031 07/19/20 1644 07/19/20 2103 07/20/20 0002  GLUCAP 92 97 111* 91 126*   D-Dimer No results for input(s): DDIMER in the last 72 hours. Hgb A1c No results for input(s): HGBA1C in the last 72 hours. Lipid Profile No results for input(s): CHOL, HDL, LDLCALC, TRIG, CHOLHDL, LDLDIRECT in the last 72 hours. Thyroid function studies No results for input(s): TSH, T4TOTAL, T3FREE, THYROIDAB in the last 72 hours.  Invalid input(s): FREET3 Anemia work up No results for input(s): VITAMINB12, FOLATE, FERRITIN, TIBC, IRON, RETICCTPCT in the last 72 hours. Urinalysis    Component Value Date/Time   COLORURINE RED (A) 07/19/2020 1320   APPEARANCEUR HAZY (A) 07/19/2020 1320   LABSPEC 1.004  (L) 07/19/2020 1320   PHURINE 6.0 07/19/2020 1320   GLUCOSEU 50 (A) 07/19/2020 1320   HGBUR MODERATE (A) 07/19/2020 1320   BILIRUBINUR NEGATIVE 07/19/2020 1320   KETONESUR NEGATIVE 07/19/2020 1320   PROTEINUR 100 (A) 07/19/2020 1320   NITRITE POSITIVE (A) 07/19/2020 1320   LEUKOCYTESUR SMALL (A) 07/19/2020 1320   Sepsis Labs Invalid input(s): PROCALCITONIN,  WBC,  LACTICIDVEN Microbiology Recent Results (from the past 240 hour(s))  Resp Panel by RT-PCR (Flu A&B, Covid) Nasopharyngeal Swab     Status: None   Collection Time: 07/17/20  8:36 AM   Specimen: Nasopharyngeal Swab; Nasopharyngeal(NP) swabs in vial transport medium  Result Value Ref Range Status   SARS Coronavirus 2 by RT PCR NEGATIVE NEGATIVE Final    Comment: (NOTE) SARS-CoV-2 target nucleic acids are NOT DETECTED.  The SARS-CoV-2 RNA is generally detectable in upper respiratory specimens during the acute phase of infection. The lowest concentration of SARS-CoV-2 viral copies this assay can detect is 138 copies/mL. A negative result does not preclude SARS-Cov-2 infection and should not be used as the sole basis for treatment or other patient management decisions. A negative result may occur with  improper specimen collection/handling, submission of specimen other than nasopharyngeal swab, presence of viral mutation(s) within the areas targeted by this assay, and inadequate number of viral copies(<138 copies/mL). A negative result must be combined with clinical observations, patient history, and epidemiological information. The expected result is Negative.  Fact Sheet for Patients:  EntrepreneurPulse.com.au  Fact Sheet for Healthcare Providers:  IncredibleEmployment.be  This test is no t yet approved or cleared by the [place] FDA and  has been authorized for detection and/or diagnosis of SARS-CoV-2 by FDA under an Emergency Use Authorization (EUA). This EUA will remain  in  effect (meaning this test  can be used) for the duration of the COVID-19 declaration under Section 564(b)(1) of the Act, 21 U.S.C.section 360bbb-3(b)(1), unless the authorization is terminated  or revoked sooner.       Influenza A by PCR NEGATIVE NEGATIVE Final   Influenza B by PCR NEGATIVE NEGATIVE Final    Comment: (NOTE) The Xpert Xpress SARS-CoV-2/FLU/RSV plus assay is intended as an aid in the diagnosis of influenza from Nasopharyngeal swab specimens and should not be used as a sole basis for treatment. Nasal washings and aspirates are unacceptable for Xpert Xpress SARS-CoV-2/FLU/RSV testing.  Fact Sheet for Patients: EntrepreneurPulse.com.au  Fact Sheet for Healthcare Providers: IncredibleEmployment.be  This test is not yet approved or cleared by the [place] FDA and has been authorized for detection and/or diagnosis of SARS-CoV-2 by FDA under an Emergency Use Authorization (EUA). This EUA will remain in effect (meaning this test can be used) for the duration of the COVID-19 declaration under Section 564(b)(1) of the Act, 21 U.S.C. section 360bbb-3(b)(1), unless the authorization is terminated or revoked.  Performed at [facility], [place] [address], [place], [place] [address]   Urine culture     Status: Abnormal   Collection Time: 07/17/20  8:47 AM   Specimen: Urine, Random  Result Value Ref Range Status   Specimen Description   Final    URINE, RANDOM Performed at [facility] [address] [place], [place] [address]    Special Requests   Final    NONE Performed at [facility], [place] [address], [place], [place] [address]    Culture (A)  Final    >=100,000 COLONIES/mL ESCHERICHIA COLI 60,000 COLONIES/mL PROTEUS MIRABILIS    Report Status 07/19/2020 FINAL  Final   Organism ID, Bacteria ESCHERICHIA COLI (A)  Final   Organism ID, Bacteria PROTEUS MIRABILIS (A)  Final       Susceptibility   Escherichia coli - MIC*    AMPICILLIN <=2 SENSITIVE Sensitive     CEFAZOLIN <=4 SENSITIVE Sensitive     CEFEPIME <=0.12 SENSITIVE Sensitive     CEFTRIAXONE <=0.25 SENSITIVE Sensitive     CIPROFLOXACIN <=0.25 SENSITIVE Sensitive     GENTAMICIN <=1 SENSITIVE Sensitive     IMIPENEM <=0.25 SENSITIVE Sensitive     NITROFURANTOIN <=16 SENSITIVE Sensitive     TRIMETH/SULFA <=20 SENSITIVE Sensitive     AMPICILLIN/SULBACTAM <=2 SENSITIVE Sensitive     PIP/TAZO <=4 SENSITIVE Sensitive     * >=100,000 COLONIES/mL ESCHERICHIA COLI   Proteus mirabilis - MIC*    AMPICILLIN <=2 SENSITIVE Sensitive     CEFAZOLIN <=4 SENSITIVE Sensitive     CEFEPIME <=0.12 SENSITIVE Sensitive     CEFTRIAXONE <=0.25 SENSITIVE Sensitive     CIPROFLOXACIN <=0.25 SENSITIVE Sensitive     GENTAMICIN <=1 SENSITIVE Sensitive     IMIPENEM 2 SENSITIVE Sensitive     NITROFURANTOIN 128 RESISTANT Resistant     TRIMETH/SULFA <=20 SENSITIVE Sensitive     AMPICILLIN/SULBACTAM <=2 SENSITIVE Sensitive     PIP/TAZO <=4 SENSITIVE Sensitive     * 60,000 COLONIES/mL PROTEUS MIRABILIS  Culture, Urine     Status: Abnormal (Preliminary result)   Collection Time: 07/19/20  1:20 PM   Specimen: Urine, Clean Catch  Result Value Ref Range Status   Specimen Description   Final    URINE, CLEAN CATCH Performed at [facility], [place] [address] [place], [place] [address]    Special Requests   Final    Normal Performed at  [facility], [place] [address], [place], [place] [address]    Culture (A)  Final    80,000 COLONIES/mL ESCHERICHIA COLI 40,000 COLONIES/mL ENTEROCOCCUS FAECALIS SUSCEPTIBILITIES TO FOLLOW Performed at [facility], [place] [address], [place], [place] [address]    Report Status PENDING  Incomplete  Culture, blood (routine x 2)     Status: None (Preliminary result)   Collection Time: 07/19/20  1:49 PM   Specimen: BLOOD  Result Value Ref Range Status    Specimen Description   Final    BLOOD BLOOD LEFT FOREARM Performed at [facility] [address] [place], [place] [address]    Special Requests   Final    BOTTLES DRAWN AEROBIC ONLY Blood Culture adequate volume Performed at [facility] [address] [place], [place] [address]    Culture   Final    NO GROWTH < 24 HOURS Performed at [facility] [address] [place], [place] [address]    Report Status PENDING  Incomplete  Culture, blood (routine x 2)     Status: None (Preliminary result)   Collection Time: 07/19/20  1:49 PM   Specimen: BLOOD  Result Value Ref Range Status   Specimen Description   Final    BLOOD BLOOD RIGHT HAND Performed at [facility] [address], [place], [place] [address]    Special Requests   Final    BOTTLES DRAWN AEROBIC AND ANAEROBIC Blood Culture adequate volume Performed at [facility] [address] [place], [place] [address]    Culture   Final    NO GROWTH < 24 HOURS Performed at [facility] [address], [place],  [address]    Report Status PENDING  Incomplete     Time coordinating discharge: Over 30 minutes  SIGNED:   Shawna Clamp, MD  [facility] 07/20/2020, 1:15 PM Pager   If 7PM-7AM, please contact night-coverage [email or web address]

## 2020-07-20 NOTE — Progress Notes (Signed)
Called by Dr. Dwyane Dee as patient spiked a temp yesterday afternoon to 101.6.  She continues to feel well.  Also her hemoglobin dropped to 8.5.  On exam in bed she is alert and oriented, ileal conduit looks pink and healthy.  Urine is much clearer today.  Very light red to pink.  Nephroureteral stents emanating from the urostomy intact and at equal depth.  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  IR repositioned and placed new nephroureteral stents emanating from the ileal conduit 07/17/2020.  Hematuria continues to improve and is not clinically significant.  Supportive care and transfusion as needed.  Urine culture grew Proteus and E. coli and she should continue on antibiotics to cover both of these bacteria for 10 days.  Intermittent fevers can be consistent with a pyelonephritis and if she is well she can be treated outpatient.  I discussed return precautions with her. She is clear for discharge from a urologic point of view.  She already has an appointment scheduled with Dr. Claudia Desanctis 07/23/2020 at 3 PM.  I put this on the chart.

## 2020-07-21 LAB — URINE CULTURE
Culture: >=100000 — AB
Special Requests: NORMAL

## 2020-07-24 LAB — CULTURE, BLOOD (ROUTINE X 2)
Culture: NO GROWTH
Culture: NO GROWTH
Special Requests: ADEQUATE
Special Requests: ADEQUATE

## 2020-08-01 ENCOUNTER — Emergency Department (HOSPITAL_COMMUNITY)
Admission: EM | Admit: 2020-08-01 | Discharge: 2020-08-01 | Disposition: A | Discharge status: Home / Self Care | Payer: Medicare Other | Attending: Emergency Medicine | Admitting: Emergency Medicine

## 2020-08-01 ENCOUNTER — Other Ambulatory Visit: Payer: Self-pay

## 2020-08-01 ENCOUNTER — Encounter (HOSPITAL_COMMUNITY): Payer: Self-pay | Admitting: Emergency Medicine

## 2020-08-01 ENCOUNTER — Emergency Department (HOSPITAL_COMMUNITY): Payer: Medicare Other

## 2020-08-01 DIAGNOSIS — Z7982 Long term (current) use of aspirin: Secondary | ICD-10-CM | POA: Diagnosis not present

## 2020-08-01 DIAGNOSIS — N184 Chronic kidney disease, stage 4 (severe): Secondary | ICD-10-CM | POA: Insufficient documentation

## 2020-08-01 DIAGNOSIS — J449 Chronic obstructive pulmonary disease, unspecified: Secondary | ICD-10-CM | POA: Diagnosis not present

## 2020-08-01 DIAGNOSIS — J45909 Unspecified asthma, uncomplicated: Secondary | ICD-10-CM | POA: Insufficient documentation

## 2020-08-01 DIAGNOSIS — R0789 Other chest pain: Secondary | ICD-10-CM | POA: Diagnosis not present

## 2020-08-01 DIAGNOSIS — Z79899 Other long term (current) drug therapy: Secondary | ICD-10-CM | POA: Insufficient documentation

## 2020-08-01 DIAGNOSIS — Z859 Personal history of malignant neoplasm, unspecified: Secondary | ICD-10-CM | POA: Insufficient documentation

## 2020-08-01 DIAGNOSIS — I509 Heart failure, unspecified: Secondary | ICD-10-CM | POA: Diagnosis not present

## 2020-08-01 DIAGNOSIS — N12 Tubulo-interstitial nephritis, not specified as acute or chronic: Secondary | ICD-10-CM

## 2020-08-01 DIAGNOSIS — R1013 Epigastric pain: Secondary | ICD-10-CM | POA: Insufficient documentation

## 2020-08-01 DIAGNOSIS — Z87891 Personal history of nicotine dependence: Secondary | ICD-10-CM | POA: Insufficient documentation

## 2020-08-01 DIAGNOSIS — R112 Nausea with vomiting, unspecified: Secondary | ICD-10-CM | POA: Diagnosis not present

## 2020-08-01 LAB — URINALYSIS, ROUTINE W REFLEX MICROSCOPIC
Bilirubin Urine: NEGATIVE
Glucose, UA: NEGATIVE mg/dL
Ketones, ur: 5 mg/dL — AB
Nitrite: POSITIVE — AB
Protein, ur: 100 mg/dL — AB
Specific Gravity, Urine: 1.009 (ref 1.005–1.030)
WBC, UA: >50 WBC/hpf — ABNORMAL HIGH (ref 0–5)
pH: 6 (ref 5.0–8.0)

## 2020-08-01 LAB — BASIC METABOLIC PANEL
Anion gap: 11 (ref 5–15)
BUN: 17 mg/dL (ref 8–23)
CO2: 23 mmol/L (ref 22–32)
Calcium: 9.7 mg/dL (ref 8.9–10.3)
Chloride: 105 mmol/L (ref 98–111)
Creatinine, Ser: 2.05 mg/dL — ABNORMAL HIGH (ref 0.44–1.00)
GFR, Estimated: 26 mL/min — ABNORMAL LOW (ref >60)
Glucose, Bld: 117 mg/dL — ABNORMAL HIGH (ref 70–99)
Potassium: 3.3 mmol/L — ABNORMAL LOW (ref 3.5–5.1)
Sodium: 139 mmol/L (ref 135–145)

## 2020-08-01 LAB — TROPONIN I (HIGH SENSITIVITY)
Troponin I (High Sensitivity): 10 ng/L (ref <18)
Troponin I (High Sensitivity): 11 ng/L (ref <18)

## 2020-08-01 LAB — CBC
HCT: 34.8 % — ABNORMAL LOW (ref 36.0–46.0)
Hemoglobin: 11 g/dL — ABNORMAL LOW (ref 12.0–15.0)
MCH: 28.5 pg (ref 26.0–34.0)
MCHC: 31.6 g/dL (ref 30.0–36.0)
MCV: 90.2 fL (ref 80.0–100.0)
Platelets: 675 10*3/uL — ABNORMAL HIGH (ref 150–400)
RBC: 3.86 MIL/uL — ABNORMAL LOW (ref 3.87–5.11)
RDW: 15.7 % — ABNORMAL HIGH (ref 11.5–15.5)
WBC: 7.8 10*3/uL (ref 4.0–10.5)
nRBC: 0 % (ref 0.0–0.2)

## 2020-08-01 MED ORDER — AMOXICILLIN-POT CLAVULANATE 875-125 MG PO TABS: 1.0000 | ORAL_TABLET | Freq: Two times a day (BID) | ORAL | 0 refills | Status: AC

## 2020-08-01 MED ORDER — AMOXICILLIN-POT CLAVULANATE 875-125 MG PO TABS
1.0000 | ORAL_TABLET | Freq: Once | ORAL | Status: AC
  Administered 2020-08-01: 1 via ORAL
  Filled 2020-08-01: qty 1

## 2020-08-01 MED ORDER — DIPHENHYDRAMINE HCL 50 MG/ML IJ SOLN
25.0000 mg | Freq: Once | INTRAMUSCULAR | Status: AC
  Administered 2020-08-01: 25 mg via INTRAVENOUS
  Filled 2020-08-01: qty 1

## 2020-08-01 MED ORDER — PROCHLORPERAZINE EDISYLATE 10 MG/2ML IJ SOLN
10.0000 mg | Freq: Once | INTRAMUSCULAR | Status: AC
  Administered 2020-08-01: 10 mg via INTRAVENOUS
  Filled 2020-08-01: qty 2

## 2020-08-01 MED ORDER — SODIUM CHLORIDE 0.9 % IV BOLUS
1000.0000 mL | Freq: Once | INTRAVENOUS | Status: AC
  Administered 2020-08-01: 1000 mL via INTRAVENOUS

## 2020-08-01 MED ORDER — ONDANSETRON HCL 4 MG/2ML IJ SOLN
4.0000 mg | Freq: Once | INTRAMUSCULAR | Status: AC
  Administered 2020-08-01: 4 mg via INTRAVENOUS
  Filled 2020-08-01: qty 2

## 2020-08-01 MED ORDER — FAMOTIDINE IN NACL 20-0.9 MG/50ML-% IV SOLN
20.0000 mg | Freq: Once | INTRAVENOUS | Status: AC
  Administered 2020-08-01: 20 mg via INTRAVENOUS
  Filled 2020-08-01: qty 50

## 2020-08-01 NOTE — Discharge Instructions (Signed)
Please check your MyChart to see about your other lab work.  I will try to call you on the phone when it comes back.

## 2020-08-01 NOTE — ED Provider Notes (Signed)
[facility] EMERGENCY DEPARTMENT Provider Note   CSN: [id number] Arrival date & time: 08/01/20  1001     History Chief Complaint  Patient presents with   Chest Pain   Nausea   Emesis    Shelia Chan is a 65 y.o. female.  She has a history of urethral cancer and had a cystectomy and urethrectomy and has an ileal conduit.  She was recently admitted for hematuria and hydronephrosis.  She is still on antibiotics and states that she has had upper abdominal pain and lower chest pain along with nausea and vomiting since yesterday.  Poor p.o. intake.  No known fevers.  She also feels her urine has been getting darker.  The history is provided by the patient.  Chest Pain Pain location:  Epigastric Pain quality: aching   Pain severity:  Moderate Onset quality:  Gradual Duration:  24 hours Progression:  Unchanged Chronicity:  New Relieved by:  None tried Worsened by:  Nothing Ineffective treatments:  None tried Associated symptoms: nausea and vomiting   Associated symptoms: no abdominal pain, no cough, no diaphoresis, no fever, no headache, no lower extremity edema and no shortness of breath   Emesis Severity:  Moderate Duration:  24 hours Progression:  Unchanged Recent urination:  Decreased Relieved by:  None tried Worsened by:  Nothing Associated symptoms: no abdominal pain, no cough, no fever, no headaches and no sore throat       Past Medical History:  Diagnosis Date   Arthritis    Asthma    Blood transfusion without reported diagnosis    Cancer (HCC)    CHF (congestive heart failure) (HCC)    COPD (chronic obstructive pulmonary disease) (HCC)    Heart murmur    Immune deficiency disorder (HCC)    Renal disorder     Patient Active Problem List   Diagnosis Date Noted   Hydronephrosis 07/17/2020   Small bowel obstruction ([facility] [place]) 04/08/2019   CKD (chronic kidney disease), stage IV ([facility]) 04/08/2019    Past Surgical History:  Procedure Laterality  Date   BLADDER REMOVAL  118/22/2015   FRACTURE SURGERY Left 02/2018   pins   IR CATHETER TUBE CHANGE  04/15/2019   IR CATHETER TUBE CHANGE  04/15/2019   IR EXT NEPHROURETERAL CATH EXCHANGE  07/15/2019   IR EXT NEPHROURETERAL CATH EXCHANGE  10/14/2019   IR EXT NEPHROURETERAL CATH EXCHANGE  10/14/2019   IR EXT NEPHROURETERAL CATH EXCHANGE  01/06/2020   IR EXT NEPHROURETERAL CATH EXCHANGE  03/16/2020   IR EXT NEPHROURETERAL CATH EXCHANGE  05/25/2020   IR EXT NEPHROURETERAL CATH EXCHANGE  07/17/2020   IR EXT NEPHROURETERAL CATH EXCHANGE  07/17/2020     OB History   No obstetric history on file.     Family History  Problem Relation Age of Onset   Heart disease Maternal Grandfather     Social History   Tobacco Use   Smoking status: Former    Packs/day: 1.00    Years: 50.00    Pack years: 50.00    Types: Cigarettes    Quit date: 01/21/2019    Years since quitting: 1.5   Smokeless tobacco: Never  Vaping Use   Vaping Use: Never used  Substance Use Topics   Alcohol use: Not Currently   Drug use: Yes    Types: Marijuana    Comment: occasional     Home Medications Prior to Admission medications   Medication Sig Start Date End Date Taking? Authorizing Provider  acetaminophen (  TYLENOL) 500 MG tablet Take 500-1,000 mg by mouth every 6 (six) hours as needed for mild pain or headache.    [provider]  albuterol (VENTOLIN HFA) 108 (90 Base) MCG/ACT inhaler Inhale 1 puff into the lungs every 6 (six) hours as needed for shortness of breath. 05/22/20   [provider]  ARIPiprazole (ABILIFY) 30 MG tablet Take 30 mg by mouth daily. 03/19/19   [provider]  aspirin EC 81 MG tablet Take 81 mg by mouth daily.    [provider]  atorvastatin (LIPITOR) 10 MG tablet Take 10 mg by mouth at bedtime.  02/18/19   [provider]  calcitRIOL (ROCALTROL) 0.25 MCG capsule Take 0.25 mcg by mouth daily. 02/18/19   [provider]  Cholecalciferol-Vitamin C  (VITAMIN D3-VITAMIN C PO) Take 1 each by mouth daily. Gummy vitamin    [provider]  DULoxetine (CYMBALTA) 30 MG capsule Take 30 mg by mouth at bedtime. 06/21/20   [provider]  famotidine (PEPCID) 20 MG tablet Take 20 mg by mouth daily as needed for heartburn. 08/21/19   [provider]  furosemide (LASIX) 40 MG tablet Take 1 tablet (40 mg total) by mouth daily. 06/03/19 07/17/20  O'NealCassie Freer, MD  methocarbamol (ROBAXIN) 500 MG tablet Take 500 mg by mouth at bedtime as needed (restless legs/night kicks). 03/05/20   [provider]  Multiple Vitamin (MULTIVITAMIN ADULT PO) Take 1 tablet by mouth daily.    [provider]  potassium chloride SA (KLOR-CON) 20 MEQ tablet Take 1 tablet (20 mEq total) by mouth daily. 09/09/19   Geralynn Rile, MD  traZODone (DESYREL) 100 MG tablet Take 100 mg by mouth at bedtime as needed for sleep. 03/19/19   [provider]    Allergies    Metoprolol  Review of Systems   Review of Systems  Constitutional:  Negative for diaphoresis and fever.  HENT:  Negative for sore throat.   Eyes:  Negative for visual disturbance.  Respiratory:  Negative for cough and shortness of breath.   Cardiovascular:  Positive for chest pain.  Gastrointestinal:  Positive for nausea and vomiting. Negative for abdominal pain.  Genitourinary:  Negative for dysuria.  Musculoskeletal:  Negative for neck pain.  Skin:  Negative for rash.  Neurological:  Negative for headaches.   Physical Exam Updated Vital Signs BP 129/74   Pulse 67   Temp 97.8 F (36.6 C) (Oral)   Resp 13   Ht 5\' 1"  (1.549 m)   Wt 58.1 kg   SpO2 100%   BMI 24.19 kg/m   Physical Exam Vitals and nursing note reviewed.  Constitutional:      General: She is not in acute distress.    Appearance: She is well-developed.  HENT:     Head: Normocephalic and atraumatic.  Eyes:     Conjunctiva/sclera: Conjunctivae normal.  Cardiovascular:     Rate  and Rhythm: Normal rate and regular rhythm.     Heart sounds: Normal heart sounds. No murmur heard. Pulmonary:     Effort: Pulmonary effort is normal. No respiratory distress.     Breath sounds: Normal breath sounds.  Abdominal:     Palpations: Abdomen is soft. There is no mass.     Tenderness: There is no abdominal tenderness. There is no rebound.  Musculoskeletal:        General: Normal range of motion.     Cervical back: Neck supple.     Right lower  leg: No tenderness.     Left lower leg: No tenderness.  Skin:    General: Skin is warm and dry.  Neurological:     General: No focal deficit present.     Mental Status: She is alert.    ED Results / Procedures / Treatments   Labs (all labs ordered are listed, but only abnormal results are displayed) Labs Reviewed  BASIC METABOLIC PANEL - Abnormal; Notable for the following components:      Result Value   Potassium 3.3 (*)    Glucose, Bld 117 (*)    Creatinine, Ser 2.05 (*)    GFR, Estimated 26 (*)    All other components within normal limits  CBC - Abnormal; Notable for the following components:   RBC 3.86 (*)    Hemoglobin 11.0 (*)    HCT 34.8 (*)    RDW 15.7 (*)    Platelets 675 (*)    All other components within normal limits  URINE CULTURE  HEPATIC FUNCTION PANEL  LIPASE, BLOOD  URINALYSIS, ROUTINE W REFLEX MICROSCOPIC  TROPONIN I (HIGH SENSITIVITY)  TROPONIN I (HIGH SENSITIVITY)    EKG EKG Interpretation  Date/Time:  Saturday August 01 2020 10:04:34 EDT Ventricular Rate:  79 PR Interval:  152 QRS Duration: 76 QT Interval:  380 QTC Calculation: 435 R Axis:   65 Text Interpretation: Normal sinus rhythm Septal infarct , age undetermined Abnormal ECG No significant change since prior 8/21 Confirmed by Aletta Edouard [phone]) on 08/01/2020 11:14:51 AM  Radiology DG Chest 2 View  Result Date: 08/01/2020 CLINICAL DATA:  Chest pain.  Cough, nausea and vomiting. EXAM: CHEST - 2 VIEW COMPARISON:  08/19/2019  FINDINGS: Lungs are hyperinflated. There is perihilar peribronchial thickening, likely chronic. Heart is normal in size. No focal consolidations or pleural effusions. No edema. Partially imaged bilateral ureteral stents. IMPRESSION: Hyperinflation and bronchitic changes. No evidence for acute  abnormality. Electronically Signed   By: Nolon Nations M.D.   On: 08/01/2020 10:46    Procedures Procedures   Medications Ordered in ED Medications  sodium chloride 0.9 % bolus 1,000 mL (0 mLs Intravenous Stopped 08/01/20 1720)  famotidine (PEPCID) IVPB 20 mg premix (0 mg Intravenous Stopped 08/01/20 1408)  ondansetron (ZOFRAN) injection 4 mg (4 mg Intravenous Given 08/01/20 1438)  prochlorperazine (COMPAZINE) injection 10 mg (10 mg Intravenous Given 08/01/20 1731)  diphenhydrAMINE (BENADRYL) injection 25 mg (25 mg Intravenous Given 08/01/20 1728)    ED Course  I have reviewed the triage vital signs and the nursing notes.  Pertinent labs & imaging results that were available during my care of the patient were reviewed by me and considered in my medical decision making (see chart for details).    MDM Rules/Calculators/A&P                         This patient complains of nausea and vomiting chest pain; this involves an extensive number of treatment Options and is a complaint that carries with it a high risk of complications and Morbidity. The differential includes gastritis, peptic ulcer disease, ACS cholelithiasis,  I ordered, reviewed and interpreted labs, which included CBC with normal white count, hemoglobin slightly better than priors possibly reflect some dehydration, chemistries with creatinine slightly elevated troponins flat.  Have asked nursing to add on LFTs and lipase, urinalysis I ordered medication IV fluids nausea medication, Pepcid I ordered imaging studies which included chest x-ray and I independently    visualized and interpreted  imaging which showed no acute findings  Previous  records obtained and reviewed in epic, recent admission for E. coli UTI  After the interventions stated above, I reevaluated the patient and found patient still nauseous and trying to self-induced vomiting.  Her care is signed out to oncoming provider Dr. Tyrone Nine to follow-up on results of lab work.  If we can get her symptoms under control she possibly can be discharged to follow-up with PCP.   Final Clinical Impression(s) / ED Diagnoses Final diagnoses:  Atypical chest pain  Epigastric pain  Nausea and vomiting, intractability of vomiting not specified, unspecified vomiting type    Rx / DC Orders ED Discharge Orders     None        Hayden Rasmussen, MD 08/01/20 [phone]

## 2020-08-01 NOTE — ED Notes (Signed)
Pt ambulatory to BR with steady gait.

## 2020-08-01 NOTE — ED Triage Notes (Signed)
Patient BIB [facility] from home. Complaint of chest pain, n/v since last night, pain 2/10 non radiating. VSS.

## 2020-08-01 NOTE — ED Notes (Signed)
Pt c/o nausea  The pt reports that the med given is not helping  She also reports that she has been sticking her finger down her tghroat  To try to help the vomiting.  I reported to her this was not a good idea as she is drinking her cup of water

## 2020-08-03 ENCOUNTER — Other Ambulatory Visit (HOSPITAL_COMMUNITY): Payer: Medicare HMO

## 2020-08-03 LAB — URINE CULTURE: Culture: >=100000 — AB

## 2020-08-04 ENCOUNTER — Telehealth: Payer: Self-pay | Admitting: *Deleted

## 2020-08-04 NOTE — Telephone Encounter (Signed)
Post ED Visit - Positive Culture Follow-up: Successful Patient Follow-Up  Culture assessed and recommendations reviewed by:  []  Elenor Quinones, Pharm.D. []  Heide Guile, Pharm.D., BCPS AQ-ID []  Parks Neptune, Pharm.D., BCPS []  Alycia Rossetti, Pharm.D., BCPS []  [place], Pharm.D., BCPS, AAHIVP []  Legrand [place], Pharm.D., BCPS, AAHIVP []  Salome Arnt, PharmD, BCPS []  Johnnette Gourd, PharmD, BCPS []  Hughes Better, PharmD, BCPS []  Leeroy Cha, PharmD  Positive urine culture  []  Patient discharged without antimicrobial prescription and treatment is now indicated [x]  Organism is resistant to prescribed ED discharge antimicrobial []  Patient with positive blood cultures  Changes discussed with ED provider: Alecia Lemming, PA New antibiotic prescription Fosfomycin 3g x 1 dose Called to [facility], [address], [place]  Contacted patient, date 08/04/2020, time 0900   Ardeen Fillers 08/04/2020, 9:01 AM

## 2020-08-04 NOTE — Progress Notes (Signed)
ED Antimicrobial Stewardship Positive Culture Follow Up   Shelia Chan is an 66 y.o. female who presented to [facility] on 08/01/2020 with a chief complaint of  Chief Complaint  Patient presents with   Chest Pain   Nausea   Emesis    Recent Results (from the past 720 hour(s))  Resp Panel by RT-PCR (Flu A&B, Covid) Nasopharyngeal Swab     Status: None   Collection Time: 07/17/20  8:36 AM   Specimen: Nasopharyngeal Swab; Nasopharyngeal(NP) swabs in vial transport medium  Result Value Ref Range Status   SARS Coronavirus 2 by RT PCR NEGATIVE NEGATIVE Final    Comment: (NOTE) SARS-CoV-2 target nucleic acids are NOT DETECTED.  The SARS-CoV-2 RNA is generally detectable in upper respiratory specimens during the acute phase of infection. The lowest concentration of SARS-CoV-2 viral copies this assay can detect is 138 copies/mL. A negative result does not preclude SARS-Cov-2 infection and should not be used as the sole basis for treatment or other patient management decisions. A negative result may occur with  improper specimen collection/handling, submission of specimen other than nasopharyngeal swab, presence of viral mutation(s) within the areas targeted by this assay, and inadequate number of viral copies(<138 copies/mL). A negative result must be combined with clinical observations, patient history, and epidemiological information. The expected result is Negative.  Fact Sheet for Patients:  EntrepreneurPulse.com.au  Fact Sheet for Healthcare Providers:  IncredibleEmployment.be  This test is no t yet approved or cleared by the [place] FDA and  has been authorized for detection and/or diagnosis of SARS-CoV-2 by FDA under an Emergency Use Authorization (EUA). This EUA will remain  in effect (meaning this test can be used) for the duration of the COVID-19 declaration under Section 564(b)(1) of the Act, 21 U.S.C.section 360bbb-3(b)(1),  unless the authorization is terminated  or revoked sooner.       Influenza A by PCR NEGATIVE NEGATIVE Final   Influenza B by PCR NEGATIVE NEGATIVE Final    Comment: (NOTE) The Xpert Xpress SARS-CoV-2/FLU/RSV plus assay is intended as an aid in the diagnosis of influenza from Nasopharyngeal swab specimens and should not be used as a sole basis for treatment. Nasal washings and aspirates are unacceptable for Xpert Xpress SARS-CoV-2/FLU/RSV testing.  Fact Sheet for Patients: EntrepreneurPulse.com.au  Fact Sheet for Healthcare Providers: IncredibleEmployment.be  This test is not yet approved or cleared by the [place] FDA and has been authorized for detection and/or diagnosis of SARS-CoV-2 by FDA under an Emergency Use Authorization (EUA). This EUA will remain in effect (meaning this test can be used) for the duration of the COVID-19 declaration under Section 564(b)(1) of the Act, 21 U.S.C. section 360bbb-3(b)(1), unless the authorization is terminated or revoked.  Performed at [facility], [place] [address] [place], [place] [address]   Urine culture     Status: Abnormal   Collection Time: 07/17/20  8:47 AM   Specimen: Urine, Random  Result Value Ref Range Status   Specimen Description   Final    URINE, RANDOM Performed at [place] [address] [place], [place] [address]    Special Requests   Final    NONE Performed at [facility], [place] [address] [place], [place] [address]    Culture (A)  Final    >=100,000 COLONIES/mL ESCHERICHIA COLI 60,000 COLONIES/mL PROTEUS MIRABILIS    Report Status 07/19/2020 FINAL  Final   Organism ID, Bacteria ESCHERICHIA COLI (A)  Final   Organism ID,  Bacteria PROTEUS MIRABILIS (A)  Final      Susceptibility   Escherichia coli - MIC*    AMPICILLIN <=2 SENSITIVE Sensitive     CEFAZOLIN <=4 SENSITIVE Sensitive     CEFEPIME <=0.12  SENSITIVE Sensitive     CEFTRIAXONE <=0.25 SENSITIVE Sensitive     CIPROFLOXACIN <=0.25 SENSITIVE Sensitive     GENTAMICIN <=1 SENSITIVE Sensitive     IMIPENEM <=0.25 SENSITIVE Sensitive     NITROFURANTOIN <=16 SENSITIVE Sensitive     TRIMETH/SULFA <=20 SENSITIVE Sensitive     AMPICILLIN/SULBACTAM <=2 SENSITIVE Sensitive     PIP/TAZO <=4 SENSITIVE Sensitive     * >=100,000 COLONIES/mL ESCHERICHIA COLI   Proteus mirabilis - MIC*    AMPICILLIN <=2 SENSITIVE Sensitive     CEFAZOLIN <=4 SENSITIVE Sensitive     CEFEPIME <=0.12 SENSITIVE Sensitive     CEFTRIAXONE <=0.25 SENSITIVE Sensitive     CIPROFLOXACIN <=0.25 SENSITIVE Sensitive     GENTAMICIN <=1 SENSITIVE Sensitive     IMIPENEM 2 SENSITIVE Sensitive     NITROFURANTOIN 128 RESISTANT Resistant     TRIMETH/SULFA <=20 SENSITIVE Sensitive     AMPICILLIN/SULBACTAM <=2 SENSITIVE Sensitive     PIP/TAZO <=4 SENSITIVE Sensitive     * 60,000 COLONIES/mL PROTEUS MIRABILIS  Culture, Urine     Status: Abnormal   Collection Time: 07/19/20  1:20 PM   Specimen: Urine, Clean Catch  Result Value Ref Range Status   Specimen Description   Final    URINE, CLEAN CATCH Performed at [facility], [place] [address], [place], [place] [address]    Special Requests   Final    Normal Performed at [facility], [place] [address] [place], [place] [address]    Culture (A)  Final    >=100,000 COLONIES/mL ESCHERICHIA COLI Confirmed Extended Spectrum Beta-Lactamase Producer (ESBL).  In bloodstream infections from ESBL organisms, carbapenems are preferred over piperacillin/tazobactam. They are shown to have a lower risk of mortality. 40,000 COLONIES/mL ENTEROCOCCUS FAECALIS VANCOMYCIN RESISTANT ENTEROCOCCUS    Report Status 07/21/2020 FINAL  Final   Organism ID, Bacteria ESCHERICHIA COLI (A)  Final   Organism ID, Bacteria ENTEROCOCCUS FAECALIS (A)  Final      Susceptibility   Escherichia coli - MIC*    AMPICILLIN >=32  RESISTANT Resistant     CEFAZOLIN >=64 RESISTANT Resistant     CEFEPIME 2 SENSITIVE Sensitive     CEFTRIAXONE >=64 RESISTANT Resistant     CIPROFLOXACIN >=4 RESISTANT Resistant     GENTAMICIN <=1 SENSITIVE Sensitive     IMIPENEM <=0.25 SENSITIVE Sensitive     NITROFURANTOIN <=16 SENSITIVE Sensitive     TRIMETH/SULFA >=320 RESISTANT Resistant     AMPICILLIN/SULBACTAM 8 SENSITIVE Sensitive     PIP/TAZO <=4 SENSITIVE Sensitive     * >=100,000 COLONIES/mL ESCHERICHIA COLI   Enterococcus faecalis - MIC*    AMPICILLIN <=2 SENSITIVE Sensitive     NITROFURANTOIN 32 SENSITIVE Sensitive     VANCOMYCIN >=32 RESISTANT Resistant     LINEZOLID 1 SENSITIVE Sensitive     * 40,000 COLONIES/mL ENTEROCOCCUS FAECALIS  Culture, blood (routine x 2)     Status: None   Collection Time: 07/19/20  1:49 PM   Specimen: BLOOD  Result Value Ref Range Status   Specimen Description   Final    BLOOD BLOOD LEFT FOREARM Performed at [facility], [place] [address], [place], [place] [address]    Special Requests   Final    BOTTLES DRAWN  AEROBIC ONLY Blood Culture adequate volume Performed at [place] [address], [place], [place] [address]    Culture   Final    NO GROWTH 5 DAYS Performed at [facility], [place] [address] [place], [place] [address]    Report Status 07/24/2020 FINAL  Final  Culture, blood (routine x 2)     Status: None   Collection Time: 07/19/20  1:49 PM   Specimen: BLOOD  Result Value Ref Range Status   Specimen Description   Final    BLOOD BLOOD RIGHT HAND Performed at [place] [address] [place], [place] [address]    Special Requests   Final    BOTTLES DRAWN AEROBIC AND ANAEROBIC Blood Culture adequate volume Performed at [place] [address], [place], [place] [address]    Culture   Final    NO GROWTH 5 DAYS Performed at [facility], [place] [address], [place], [place]  [address]    Report Status 07/24/2020 FINAL  Final  Urine Culture     Status: Abnormal   Collection Time: 08/01/20  6:21 PM   Specimen: Urine, Clean Catch  Result Value Ref Range Status   Specimen Description URINE, CLEAN CATCH  Final   Special Requests   Final    NONE Performed at [facility], [place] [address] [place], [place] [address]    Culture (A)  Final    >=100,000 COLONIES/mL ESCHERICHIA COLI Confirmed Extended Spectrum Beta-Lactamase Producer (ESBL).  In bloodstream infections from ESBL organisms, carbapenems are preferred over piperacillin/tazobactam. They are shown to have a lower risk of mortality.    Report Status 08/03/2020 FINAL  Final   Organism ID, Bacteria ESCHERICHIA COLI (A)  Final      Susceptibility   Escherichia coli - MIC*    AMPICILLIN >=32 RESISTANT Resistant     CEFAZOLIN >=64 RESISTANT Resistant     CEFEPIME 16 RESISTANT Resistant     CEFTRIAXONE >=64 RESISTANT Resistant     CIPROFLOXACIN >=4 RESISTANT Resistant     GENTAMICIN <=1 SENSITIVE Sensitive     IMIPENEM <=0.25 SENSITIVE Sensitive     NITROFURANTOIN <=16 SENSITIVE Sensitive     TRIMETH/SULFA >=320 RESISTANT Resistant     AMPICILLIN/SULBACTAM 4 SENSITIVE Sensitive     PIP/TAZO <=4 SENSITIVE Sensitive     * >=100,000 COLONIES/mL ESCHERICHIA COLI   [x]  Treated with Augmentin, organism resistant to prescribed antimicrobial []  Patient discharged originally without antimicrobial agent and treatment is now indicated  New antibiotic prescription: discontinue Augmentin, and prescribe Fosfomycin 3g PO x 1 dose  ED Provider: Carlisle Cater, PA-C  Joetta Manners, PharmD, [facility] Emergency Medicine Clinical Pharmacist ED RPh Phone: [place]: [phone]

## 2020-08-14 ENCOUNTER — Ambulatory Visit (HOSPITAL_COMMUNITY)
Admission: RE | Admit: 2020-08-14 | Discharge: 2020-08-14 | Disposition: A | Discharge status: Home / Self Care | Payer: Medicare Other | Source: Ambulatory Visit | Attending: Interventional Radiology | Admitting: Interventional Radiology

## 2020-08-14 ENCOUNTER — Other Ambulatory Visit: Payer: Self-pay

## 2020-08-14 DIAGNOSIS — N1330 Unspecified hydronephrosis: Secondary | ICD-10-CM | POA: Insufficient documentation

## 2020-08-14 HISTORY — PX: IR EXT NEPHROURETERAL CATH EXCHANGE: IMG5418

## 2020-08-14 MED ORDER — IOHEXOL 300 MG/ML  SOLN
25.0000 mL | Freq: Once | INTRAMUSCULAR | Status: AC | PRN
  Administered 2020-08-14: 25 mL

## 2020-08-14 NOTE — Procedures (Signed)
Pre Procedure Dx: Hydronephrosis Post Procedure Dx: Same  Successful bilateral 12 Fr nephroureteral catheter exchange.    EBL: None   No immediate complications.   Ronny Bacon, MD Pager #: [phone]

## 2020-08-25 ENCOUNTER — Other Ambulatory Visit (HOSPITAL_COMMUNITY): Payer: Self-pay | Admitting: Radiology

## 2020-08-25 DIAGNOSIS — Z8551 Personal history of malignant neoplasm of bladder: Secondary | ICD-10-CM

## 2020-09-21 NOTE — Progress Notes (Signed)
Cardiology Office Note:   Date:  09/23/2020  NAME:  Shelia Chan    MRN: [id number] DOB:  1955-07-05   PCP:  [name], MD  Cardiologist:  Evalina Field, MD  Electrophysiologist:  None   Referring MD: [name], MD   Chief Complaint  Patient presents with   Follow-up    History of Present Illness:   Shelia Chan is a 65 y.o. female with a hx of bladder CA, CKD IV, atrial tachycardia who presents for follow-up. Recent admission for ureteral stent placement.  She reports she is doing well.  Denies any chest pain.  She reports that she is getting episodes of heart racing.  Can occur twice per day.  Last 10 seconds.  They are associated with stress.  She is actually stopped her metoprolol.  She reports that the medication improve her symptoms but symptoms did return.  She apparently stopped taking it and feels much better.  She associates her symptoms of palpitations with stress.  Apparently her granddaughter is living with her.  There are several issues with her in terms of employment and what she will do with her life.  They are sorting this out.  She reports that she think stress could be the cause of her symptoms.  Recent admission to the hospital for ureteral stent placement.  Overall doing well from that standpoint.  She does have chronic lower extremity edema.  Symptoms improved with Lasix that she takes daily as well as leg elevation.  No evidence of congestive heart failure on examination today.  I suspect this is all lymphedema.  She overall appears to be stable.  Blood pressure well controlled.  Denies any major symptoms in office today.  Problem List 1. CKD IV 2. COPD 3. Bladder CA s/p cystectomy and conduit  4. Atrial tachycardia  -6 episodes in 3 days; longest 6.8 seconds 5. Lymphedema   Past Medical History: Past Medical History:  Diagnosis Date   Arthritis    Asthma    Blood transfusion without reported diagnosis    Cancer ([facility])    CHF (congestive  heart failure) (HCC)    COPD (chronic obstructive pulmonary disease) (HCC)    Heart murmur    Immune deficiency disorder (HCC)    Renal disorder     Past Surgical History: Past Surgical History:  Procedure Laterality Date   BLADDER REMOVAL  118/22/2015   FRACTURE SURGERY Left 02/2018   pins   IR CATHETER TUBE CHANGE  04/15/2019   IR CATHETER TUBE CHANGE  04/15/2019   IR EXT NEPHROURETERAL CATH EXCHANGE  07/15/2019   IR EXT NEPHROURETERAL CATH EXCHANGE  10/14/2019   IR EXT NEPHROURETERAL CATH EXCHANGE  10/14/2019   IR EXT NEPHROURETERAL CATH EXCHANGE  01/06/2020   IR EXT NEPHROURETERAL CATH EXCHANGE  03/16/2020   IR EXT NEPHROURETERAL CATH EXCHANGE  05/25/2020   IR EXT NEPHROURETERAL CATH EXCHANGE  07/17/2020   IR EXT NEPHROURETERAL CATH EXCHANGE  07/17/2020   IR EXT NEPHROURETERAL CATH EXCHANGE  08/14/2020    Current Medications: Current Meds  Medication Sig   acetaminophen (TYLENOL) 500 MG tablet Take 500-1,000 mg by mouth every 6 (six) hours as needed for mild pain or headache.   albuterol (VENTOLIN HFA) 108 (90 Base) MCG/ACT inhaler Inhale 1 puff into the lungs every 6 (six) hours as needed for shortness of breath.   ARIPiprazole (ABILIFY) 30 MG tablet Take 30 mg by mouth daily.   aspirin EC 81 MG tablet Take  81 mg by mouth daily.   calcitRIOL (ROCALTROL) 0.25 MCG capsule Take 0.25 mcg by mouth daily.   Cholecalciferol-Vitamin C (VITAMIN D3-VITAMIN C PO) Take 1 each by mouth daily. Gummy vitamin   DULoxetine (CYMBALTA) 30 MG capsule Take 30 mg by mouth at bedtime.   famotidine (PEPCID) 20 MG tablet Take 20 mg by mouth daily as needed for heartburn.   Multiple Vitamin (MULTIVITAMIN ADULT PO) Take 1 tablet by mouth daily.   potassium chloride SA (KLOR-CON) 20 MEQ tablet Take 1 tablet (20 mEq total) by mouth daily.     Allergies:    Metoprolol   Social History: Social History   Socioeconomic History   Marital status: Divorced    Spouse name: Not on file   Number of children: Not  on file   Years of education: Not on file   Highest education level: Not on file  Occupational History   Occupation: disabled  Tobacco Use   Smoking status: Former    Packs/day: 1.00    Years: 50.00    Pack years: 50.00    Types: Cigarettes    Quit date: 01/21/2019    Years since quitting: 1.6   Smokeless tobacco: Never  Vaping Use   Vaping Use: Never used  Substance and Sexual Activity   Alcohol use: Not Currently   Drug use: Yes    Types: Marijuana    Comment: occasional    Sexual activity: Not on file  Other Topics Concern   Not on file  Social History Narrative   Not on file   Social Determinants of Health   Financial Resource Strain: Not on file  Food Insecurity: Not on file  Transportation Needs: Not on file  Physical Activity: Not on file  Stress: Not on file  Social Connections: Not on file     Family History: The patient's family history includes Heart disease in her maternal grandfather.  ROS:   All other ROS reviewed and negative. Pertinent positives noted in the HPI.     EKGs/Labs/Other Studies Reviewed:   The following studies were personally reviewed by me today:   Zio 06/07/2019 1. Frequent episodes of ectopic atrial rhythm and ectopic atrial tachycardia.  2. No atrial fibrillation.   TTE 06/21/2019  1. Left ventricular ejection fraction, by estimation, is 60 to 65%. The  left ventricle has normal function. The left ventricle has no regional  wall motion abnormalities. Left ventricular diastolic parameters are  consistent with Grade I diastolic  dysfunction (impaired relaxation). The average left ventricular global  longitudinal strain is -22.5 %. The global longitudinal strain is normal.   2. Right ventricular systolic function is normal. The right ventricular  size is normal. There is normal pulmonary artery systolic pressure. The  estimated right ventricular systolic pressure is 48.1 mmHg.   3. The mitral valve is normal in structure. Mild  mitral valve  regurgitation. No evidence of mitral stenosis.   4. The aortic valve is normal in structure. Aortic valve regurgitation is  mild. Mild aortic valve sclerosis is present, with no evidence of aortic  valve stenosis.   5. The inferior vena cava is normal in size with greater than 50%  respiratory variability, suggesting right atrial pressure of 3 mmHg.   Recent Labs: 07/20/2020: Magnesium 1.9 08/01/2020: BUN 17; Creatinine, Ser 2.05; Hemoglobin 11.0; Platelets 675; Potassium 3.3; Sodium 139   Recent Lipid Panel No results found for: CHOL, TRIG, HDL, CHOLHDL, VLDL, LDLCALC, LDLDIRECT  Physical Exam:   VS:  BP 125/61   Pulse 60   Ht 5' 1.5" (1.562 m)   Wt 134 lb (60.8 kg)   SpO2 97%   BMI 24.91 kg/m    Wt Readings from Last 3 Encounters:  09/23/20 134 lb (60.8 kg)  08/01/20 128 lb (58.1 kg)  07/20/20 143 lb 1.3 oz (64.9 kg)    General: Well nourished, well developed, in no acute distress Head: Atraumatic, normal size  Eyes: PEERLA, EOMI  Neck: Supple, no JVD Endocrine: No thryomegaly Cardiac: Normal S1, S2; RRR; no murmurs, rubs, or gallops Lungs: Clear to auscultation bilaterally, no wheezing, rhonchi or rales  Abd: Soft, nontender, no hepatomegaly  Ext: Lymphedema noted in the lower extremities Musculoskeletal: No deformities, BUE and BLE strength normal and equal Skin: Warm and dry, no rashes   Neuro: Alert and oriented to person, place, time, and situation, CNII-XII grossly intact, no focal deficits  Psych: Normal mood and affect   ASSESSMENT:   Tinea Nobile is a 65 y.o. female who presents for the following: 1. Atrial tachycardia (HCC)   2. Palpitations   3. Leg edema     PLAN:   1. Atrial tachycardia (HCC) 2. Palpitations -Diagnosed with brief atrial tachycardia episodes last year.  Longest episode roughly 6 seconds.  We started her on metoprolol with improvement in symptoms.  She reports symptoms improved but have returned.  She reports she stopped  metoprolol and noticed actual improvement.  She reports she was getting short of breath.  I suspect her metoprolol could have been interacting with her COPD.  She continues to have palpitations but they are very short-lived.  Last less than 10 seconds.  They are associated with stress.  For now I think it is okay to forego medication and just see how she does.  Her blood pressure is well controlled.  Echocardiogram last year was normal.  Cardiovascular examination is normal today.  I recommended good stress reductive strategies as well as discussed this with her primary care physician as she may need depression medications.  She will do this.  She will continue to see [place] yearly.  She will notify us if symptoms worsen.  3. Leg edema -Appears to be related to lymphedema.  Advised on low-salt diet as well as leg elevation.    Disposition: Return in about 1 year (around 09/23/2021).  Medication Adjustments/Labs and Tests Ordered: Current medicines are reviewed at length with the patient today.  Concerns regarding medicines are outlined above.  No orders of the defined types were placed in this encounter.  No orders of the defined types were placed in this encounter.   Patient Instructions  Medication Instructions:  The current medical regimen is effective;  continue present plan and medications.  *If you need a refill on your cardiac medications before your next appointment, please call your pharmacy*   Follow-Up: At [facility], you and your health needs are our priority.  As part of our continuing mission to provide you with exceptional heart care, we have created designated Provider Care Teams.  These Care Teams include your primary Cardiologist (physician) and Advanced Practice Providers (APPs -  Physician Assistants and Nurse Practitioners) who all work together to provide you with the care you need, when you need it.  We recommend signing up for the patient portal called "MyChart".  Sign  up information is provided on this After Visit Summary.  MyChart is used to connect with patients for Virtual Visits (Telemedicine).  Patients are able to view lab/test  results, encounter notes, upcoming appointments, etc.  Non-urgent messages can be sent to your provider as well.   To learn more about what you can do with MyChart, go to [email or web address].    Your next appointment:   12 month(s)  The format for your next appointment:   In Person  Provider:   You may see Evalina Field, MD or one of the following Advanced Practice Providers on your designated Care Team:   Almyra Deforest, PA-C Sande Rives, [place]     Time Spent with Patient: I have spent a total of 25 minutes with patient reviewing hospital notes, telemetry, EKGs, labs and examining the patient as well as establishing an assessment and plan that was discussed with the patient.  > 50% of time was spent in direct patient care.  Signed, Addison Naegeli. Audie Box, MD, [place]  [address], [place], [place] [address] [phone]  09/23/2020 9:50 AM

## 2020-09-23 ENCOUNTER — Ambulatory Visit (INDEPENDENT_AMBULATORY_CARE_PROVIDER_SITE_OTHER): Payer: Medicare Other | Admitting: Cardiovascular Disease

## 2020-09-23 ENCOUNTER — Encounter: Payer: Self-pay | Admitting: Cardiovascular Disease

## 2020-09-23 ENCOUNTER — Other Ambulatory Visit: Payer: Self-pay

## 2020-09-23 VITALS — BP 125/61 | HR 60 | Ht 61.5 in | Wt 134.0 lb

## 2020-09-23 DIAGNOSIS — R002 Palpitations: Secondary | ICD-10-CM | POA: Diagnosis not present

## 2020-09-23 DIAGNOSIS — R600 Localized edema: Secondary | ICD-10-CM | POA: Diagnosis not present

## 2020-09-23 DIAGNOSIS — I471 Supraventricular tachycardia: Secondary | ICD-10-CM

## 2020-09-23 NOTE — Patient Instructions (Signed)
Medication Instructions:  The current medical regimen is effective;  continue present plan and medications.  *If you need a refill on your cardiac medications before your next appointment, please call your pharmacy*   Follow-Up: At [facility], you and your health needs are our priority.  As part of our continuing mission to provide you with exceptional heart care, we have created designated Provider Care Teams.  These Care Teams include your primary Cardiologist (physician) and Advanced Practice Providers (APPs -  Physician Assistants and Nurse Practitioners) who all work together to provide you with the care you need, when you need it.  We recommend signing up for the patient portal called "MyChart".  Sign up information is provided on this After Visit Summary.  MyChart is used to connect with patients for Virtual Visits (Telemedicine).  Patients are able to view lab/test results, encounter notes, upcoming appointments, etc.  Non-urgent messages can be sent to your provider as well.   To learn more about what you can do with MyChart, go to [email or web address].    Your next appointment:   12 month(s)  The format for your next appointment:   In Person  Provider:   You may see [place] T O'Neal, MD or one of the following Advanced Practice Providers on your designated Care Team:   Hao Meng, PA-C Callie Goodrich, PA-C   

## 2020-09-25 ENCOUNTER — Other Ambulatory Visit (HOSPITAL_COMMUNITY): Payer: Medicare Other

## 2020-09-25 ENCOUNTER — Other Ambulatory Visit (HOSPITAL_COMMUNITY): Payer: Self-pay | Admitting: Radiology

## 2020-09-25 ENCOUNTER — Other Ambulatory Visit: Payer: Self-pay

## 2020-09-25 ENCOUNTER — Ambulatory Visit (HOSPITAL_COMMUNITY)
Admission: RE | Admit: 2020-09-25 | Discharge: 2020-09-25 | Disposition: A | Discharge status: Home / Self Care | Payer: Medicare Other | Source: Ambulatory Visit | Attending: Radiology | Admitting: Radiology

## 2020-09-25 DIAGNOSIS — Z8551 Personal history of malignant neoplasm of bladder: Secondary | ICD-10-CM

## 2020-09-25 DIAGNOSIS — Z436 Encounter for attention to other artificial openings of urinary tract: Secondary | ICD-10-CM | POA: Diagnosis not present

## 2020-09-25 HISTORY — PX: IR EXT NEPHROURETERAL CATH EXCHANGE: IMG5418

## 2020-09-25 MED ORDER — IOHEXOL 300 MG/ML  SOLN
25.0000 mL | Freq: Once | INTRAMUSCULAR | Status: AC | PRN
  Administered 2020-09-25: 10 mL

## 2020-10-28 ENCOUNTER — Other Ambulatory Visit: Payer: Self-pay | Admitting: Cardiovascular Disease

## 2020-10-29 ENCOUNTER — Telehealth: Payer: Self-pay | Admitting: Oncology

## 2020-10-29 NOTE — Telephone Encounter (Signed)
Scheduled appt per 10/12 referral. Pt aware is aware of appt date and time.

## 2020-11-05 ENCOUNTER — Other Ambulatory Visit (HOSPITAL_COMMUNITY): Payer: Self-pay | Admitting: Physician Assistant

## 2020-11-06 ENCOUNTER — Other Ambulatory Visit (HOSPITAL_COMMUNITY): Payer: Self-pay | Admitting: Interventional Radiology

## 2020-11-06 ENCOUNTER — Other Ambulatory Visit (HOSPITAL_COMMUNITY): Payer: Self-pay | Admitting: Radiology

## 2020-11-06 ENCOUNTER — Ambulatory Visit (HOSPITAL_COMMUNITY)
Admission: RE | Admit: 2020-11-06 | Discharge: 2020-11-06 | Disposition: A | Discharge status: Home / Self Care | Payer: Medicare Other | Source: Ambulatory Visit | Attending: Radiology | Admitting: Radiology

## 2020-11-06 DIAGNOSIS — Z436 Encounter for attention to other artificial openings of urinary tract: Secondary | ICD-10-CM | POA: Insufficient documentation

## 2020-11-06 DIAGNOSIS — Z8551 Personal history of malignant neoplasm of bladder: Secondary | ICD-10-CM

## 2020-11-06 DIAGNOSIS — N1330 Unspecified hydronephrosis: Secondary | ICD-10-CM | POA: Insufficient documentation

## 2020-11-06 DIAGNOSIS — T83593A Infection and inflammatory reaction due to other urinary stents, initial encounter: Secondary | ICD-10-CM | POA: Diagnosis not present

## 2020-11-06 DIAGNOSIS — N390 Urinary tract infection, site not specified: Secondary | ICD-10-CM | POA: Diagnosis not present

## 2020-11-06 HISTORY — PX: IR EXT NEPHROURETERAL CATH EXCHANGE: IMG5418

## 2020-11-06 MED ORDER — IOHEXOL 300 MG/ML  SOLN
20.0000 mL | Freq: Once | INTRAMUSCULAR | Status: AC | PRN
  Administered 2020-11-06: 15 mL

## 2020-11-06 NOTE — Procedures (Signed)
Pre Procedure Dx: Hydronephrosis Post Procedure Dx: Same  Successful bilateral 12 Fr nephroureteral catheter exchange.    EBL: None No immediate complications.   Ronny Bacon, MD Pager #: [phone]

## 2020-11-08 ENCOUNTER — Emergency Department (HOSPITAL_COMMUNITY): Payer: Medicare Other

## 2020-11-08 ENCOUNTER — Inpatient Hospital Stay (HOSPITAL_COMMUNITY)
Admission: EM | Admit: 2020-11-08 | Discharge: 2020-11-13 | DRG: 698 | Disposition: A | Discharge status: Home / Self Care | Payer: Medicare Other | Attending: Family Medicine | Admitting: Family Medicine

## 2020-11-08 ENCOUNTER — Other Ambulatory Visit: Payer: Self-pay

## 2020-11-08 DIAGNOSIS — T83512A Infection and inflammatory reaction due to nephrostomy catheter, initial encounter: Secondary | ICD-10-CM

## 2020-11-08 DIAGNOSIS — Z906 Acquired absence of other parts of urinary tract: Secondary | ICD-10-CM

## 2020-11-08 DIAGNOSIS — R319 Hematuria, unspecified: Secondary | ICD-10-CM

## 2020-11-08 DIAGNOSIS — I5032 Chronic diastolic (congestive) heart failure: Secondary | ICD-10-CM | POA: Diagnosis present

## 2020-11-08 DIAGNOSIS — A419 Sepsis, unspecified organism: Secondary | ICD-10-CM | POA: Diagnosis not present

## 2020-11-08 DIAGNOSIS — J449 Chronic obstructive pulmonary disease, unspecified: Secondary | ICD-10-CM | POA: Diagnosis present

## 2020-11-08 DIAGNOSIS — B965 Pseudomonas (aeruginosa) (mallei) (pseudomallei) as the cause of diseases classified elsewhere: Secondary | ICD-10-CM | POA: Diagnosis present

## 2020-11-08 DIAGNOSIS — R609 Edema, unspecified: Secondary | ICD-10-CM | POA: Diagnosis not present

## 2020-11-08 DIAGNOSIS — Z8619 Personal history of other infectious and parasitic diseases: Secondary | ICD-10-CM

## 2020-11-08 DIAGNOSIS — R112 Nausea with vomiting, unspecified: Secondary | ICD-10-CM | POA: Diagnosis present

## 2020-11-08 DIAGNOSIS — Z87891 Personal history of nicotine dependence: Secondary | ICD-10-CM

## 2020-11-08 DIAGNOSIS — I890 Lymphedema, not elsewhere classified: Secondary | ICD-10-CM | POA: Diagnosis present

## 2020-11-08 DIAGNOSIS — K5900 Constipation, unspecified: Secondary | ICD-10-CM | POA: Diagnosis present

## 2020-11-08 DIAGNOSIS — Z87440 Personal history of urinary (tract) infections: Secondary | ICD-10-CM

## 2020-11-08 DIAGNOSIS — N1330 Unspecified hydronephrosis: Secondary | ICD-10-CM | POA: Diagnosis present

## 2020-11-08 DIAGNOSIS — T83511A Infection and inflammatory reaction due to indwelling urethral catheter, initial encounter: Secondary | ICD-10-CM | POA: Diagnosis present

## 2020-11-08 DIAGNOSIS — J45901 Unspecified asthma with (acute) exacerbation: Secondary | ICD-10-CM

## 2020-11-08 DIAGNOSIS — Z436 Encounter for attention to other artificial openings of urinary tract: Secondary | ICD-10-CM

## 2020-11-08 DIAGNOSIS — T83593A Infection and inflammatory reaction due to other urinary stents, initial encounter: Principal | ICD-10-CM | POA: Diagnosis present

## 2020-11-08 DIAGNOSIS — N390 Urinary tract infection, site not specified: Secondary | ICD-10-CM | POA: Diagnosis present

## 2020-11-08 DIAGNOSIS — R197 Diarrhea, unspecified: Secondary | ICD-10-CM | POA: Diagnosis present

## 2020-11-08 DIAGNOSIS — N184 Chronic kidney disease, stage 4 (severe): Secondary | ICD-10-CM | POA: Diagnosis not present

## 2020-11-08 DIAGNOSIS — R310 Gross hematuria: Secondary | ICD-10-CM | POA: Diagnosis present

## 2020-11-08 DIAGNOSIS — Z66 Do not resuscitate: Secondary | ICD-10-CM | POA: Diagnosis present

## 2020-11-08 DIAGNOSIS — M869 Osteomyelitis, unspecified: Secondary | ICD-10-CM | POA: Diagnosis present

## 2020-11-08 DIAGNOSIS — Z8249 Family history of ischemic heart disease and other diseases of the circulatory system: Secondary | ICD-10-CM

## 2020-11-08 DIAGNOSIS — E876 Hypokalemia: Secondary | ICD-10-CM | POA: Diagnosis present

## 2020-11-08 LAB — URINALYSIS, ROUTINE W REFLEX MICROSCOPIC
Bilirubin Urine: NEGATIVE
Glucose, UA: NEGATIVE mg/dL
Ketones, ur: NEGATIVE mg/dL
Nitrite: NEGATIVE
Protein, ur: 100 mg/dL — AB
RBC / HPF: >50 RBC/hpf — ABNORMAL HIGH (ref 0–5)
Specific Gravity, Urine: 1.011 (ref 1.005–1.030)
pH: 6 (ref 5.0–8.0)

## 2020-11-08 LAB — COMPREHENSIVE METABOLIC PANEL
ALT: 12 U/L (ref 0–44)
AST: 17 U/L (ref 15–41)
Albumin: 3.9 g/dL (ref 3.5–5.0)
Alkaline Phosphatase: 102 U/L (ref 38–126)
Anion gap: 11 (ref 5–15)
BUN: 23 mg/dL (ref 8–23)
CO2: 27 mmol/L (ref 22–32)
Calcium: 9.4 mg/dL (ref 8.9–10.3)
Chloride: 99 mmol/L (ref 98–111)
Creatinine, Ser: 1.78 mg/dL — ABNORMAL HIGH (ref 0.44–1.00)
GFR, Estimated: 31 mL/min — ABNORMAL LOW (ref >60)
Glucose, Bld: 142 mg/dL — ABNORMAL HIGH (ref 70–99)
Potassium: 3.3 mmol/L — ABNORMAL LOW (ref 3.5–5.1)
Sodium: 137 mmol/L (ref 135–145)
Total Bilirubin: 0.6 mg/dL (ref 0.3–1.2)
Total Protein: 8 g/dL (ref 6.5–8.1)

## 2020-11-08 LAB — CBC WITH DIFFERENTIAL/PLATELET
Abs Immature Granulocytes: 0.05 10*3/uL (ref 0.00–0.07)
Basophils Absolute: 0 10*3/uL (ref 0.0–0.1)
Basophils Relative: 0 %
Eosinophils Absolute: 0 10*3/uL (ref 0.0–0.5)
Eosinophils Relative: 0 %
HCT: 37.8 % (ref 36.0–46.0)
Hemoglobin: 12.3 g/dL (ref 12.0–15.0)
Immature Granulocytes: 1 %
Lymphocytes Relative: 5 %
Lymphs Abs: 0.4 10*3/uL — ABNORMAL LOW (ref 0.7–4.0)
MCH: 28.3 pg (ref 26.0–34.0)
MCHC: 32.5 g/dL (ref 30.0–36.0)
MCV: 87.1 fL (ref 80.0–100.0)
Monocytes Absolute: 0.2 10*3/uL (ref 0.1–1.0)
Monocytes Relative: 2 %
Neutro Abs: 8.6 10*3/uL — ABNORMAL HIGH (ref 1.7–7.7)
Neutrophils Relative %: 92 %
Platelets: 426 10*3/uL — ABNORMAL HIGH (ref 150–400)
RBC: 4.34 MIL/uL (ref 3.87–5.11)
RDW: 14.4 % (ref 11.5–15.5)
WBC: 9.3 10*3/uL (ref 4.0–10.5)
nRBC: 0 % (ref 0.0–0.2)

## 2020-11-08 LAB — CBC
HCT: 33.8 % — ABNORMAL LOW (ref 36.0–46.0)
Hemoglobin: 11 g/dL — ABNORMAL LOW (ref 12.0–15.0)
MCH: 29 pg (ref 26.0–34.0)
MCHC: 32.5 g/dL (ref 30.0–36.0)
MCV: 89.2 fL (ref 80.0–100.0)
Platelets: 321 10*3/uL (ref 150–400)
RBC: 3.79 MIL/uL — ABNORMAL LOW (ref 3.87–5.11)
RDW: 14.6 % (ref 11.5–15.5)
WBC: 12.8 10*3/uL — ABNORMAL HIGH (ref 4.0–10.5)
nRBC: 0 % (ref 0.0–0.2)

## 2020-11-08 LAB — TROPONIN I (HIGH SENSITIVITY)
Troponin I (High Sensitivity): 8 ng/L (ref <18)
Troponin I (High Sensitivity): 8 ng/L (ref <18)

## 2020-11-08 LAB — MAGNESIUM: Magnesium: 2.2 mg/dL (ref 1.7–2.4)

## 2020-11-08 MED ORDER — ACETAMINOPHEN 650 MG RE SUPP: 650.0000 mg | Freq: Four times a day (QID) | RECTAL | Status: DC | PRN

## 2020-11-08 MED ORDER — FUROSEMIDE 40 MG PO TABS
40.0000 mg | ORAL_TABLET | Freq: Every day | ORAL | Status: DC
  Administered 2020-11-09 – 2020-11-13 (×5): 40 mg via ORAL
  Filled 2020-11-08 (×5): qty 1

## 2020-11-08 MED ORDER — MORPHINE SULFATE (PF) 4 MG/ML IV SOLN
4.0000 mg | Freq: Once | INTRAVENOUS | Status: AC
  Administered 2020-11-08: 4 mg via INTRAVENOUS
  Filled 2020-11-08: qty 1

## 2020-11-08 MED ORDER — ARIPIPRAZOLE 10 MG PO TABS
30.0000 mg | ORAL_TABLET | Freq: Every day | ORAL | Status: DC
  Administered 2020-11-09 – 2020-11-13 (×5): 30 mg via ORAL
  Filled 2020-11-08 (×5): qty 3

## 2020-11-08 MED ORDER — RISAQUAD PO CAPS
1.0000 | ORAL_CAPSULE | Freq: Every day | ORAL | Status: DC
  Administered 2020-11-09 – 2020-11-13 (×4): 1 via ORAL
  Filled 2020-11-08 (×4): qty 1

## 2020-11-08 MED ORDER — DULOXETINE HCL 30 MG PO CPEP: 30.0000 mg | ORAL_CAPSULE | Freq: Every day | ORAL | Status: DC

## 2020-11-08 MED ORDER — ADULT MULTIVITAMIN W/MINERALS CH
ORAL_TABLET | Freq: Every day | ORAL | Status: DC
  Administered 2020-11-12 – 2020-11-13 (×2): 1 via ORAL
  Filled 2020-11-08 (×4): qty 1

## 2020-11-08 MED ORDER — SODIUM CHLORIDE 0.9 % IV SOLN
12.5000 mg | Freq: Once | INTRAVENOUS | Status: AC
  Administered 2020-11-08: 12.5 mg via INTRAVENOUS
  Filled 2020-11-08: qty 0.5

## 2020-11-08 MED ORDER — PIPERACILLIN-TAZOBACTAM 3.375 G IVPB
3.3750 g | Freq: Three times a day (TID) | INTRAVENOUS | Status: DC
  Administered 2020-11-09 – 2020-11-12 (×10): 3.375 g via INTRAVENOUS
  Filled 2020-11-08 (×10): qty 50

## 2020-11-08 MED ORDER — ASCORBIC ACID 500 MG PO TABS
500.0000 mg | ORAL_TABLET | Freq: Every day | ORAL | Status: DC
  Administered 2020-11-11 – 2020-11-13 (×3): 500 mg via ORAL
  Filled 2020-11-08 (×4): qty 1

## 2020-11-08 MED ORDER — ALBUTEROL SULFATE (2.5 MG/3ML) 0.083% IN NEBU: 3.0000 mL | INHALATION_SOLUTION | Freq: Four times a day (QID) | RESPIRATORY_TRACT | Status: DC | PRN

## 2020-11-08 MED ORDER — SODIUM CHLORIDE 0.9% FLUSH
3.0000 mL | Freq: Two times a day (BID) | INTRAVENOUS | Status: DC
  Administered 2020-11-08 – 2020-11-13 (×7): 3 mL via INTRAVENOUS

## 2020-11-08 MED ORDER — HEPARIN SODIUM (PORCINE) 5000 UNIT/ML IJ SOLN: 5000.0000 [IU] | Freq: Three times a day (TID) | INTRAMUSCULAR | Status: DC

## 2020-11-08 MED ORDER — ONDANSETRON HCL 4 MG/2ML IJ SOLN
4.0000 mg | Freq: Four times a day (QID) | INTRAMUSCULAR | Status: DC | PRN
  Administered 2020-11-08 – 2020-11-12 (×10): 4 mg via INTRAVENOUS
  Filled 2020-11-08 (×9): qty 2

## 2020-11-08 MED ORDER — ONDANSETRON HCL 4 MG/2ML IJ SOLN
4.0000 mg | Freq: Once | INTRAMUSCULAR | Status: DC
  Filled 2020-11-08: qty 2

## 2020-11-08 MED ORDER — POTASSIUM CHLORIDE CRYS ER 20 MEQ PO TBCR
40.0000 meq | EXTENDED_RELEASE_TABLET | Freq: Once | ORAL | Status: AC
  Administered 2020-11-08: 40 meq via ORAL
  Filled 2020-11-08 (×2): qty 2

## 2020-11-08 MED ORDER — B COMPLEX-C PO TABS
1.0000 | ORAL_TABLET | Freq: Every day | ORAL | Status: DC
  Administered 2020-11-11 – 2020-11-13 (×3): 1 via ORAL
  Filled 2020-11-08 (×5): qty 1

## 2020-11-08 MED ORDER — ONDANSETRON HCL 4 MG/2ML IJ SOLN
4.0000 mg | Freq: Once | INTRAMUSCULAR | Status: AC
Start: 2020-11-08 — End: 2020-11-08
  Administered 2020-11-08: 4 mg via INTRAVENOUS
  Filled 2020-11-08: qty 2

## 2020-11-08 MED ORDER — VITAMIN D3 25 MCG (1000 UNIT) PO TABS
2000.0000 [IU] | ORAL_TABLET | Freq: Every day | ORAL | Status: DC
  Administered 2020-11-09 – 2020-11-13 (×4): 2000 [IU] via ORAL
  Filled 2020-11-08 (×4): qty 2

## 2020-11-08 MED ORDER — ONDANSETRON HCL 4 MG PO TABS
4.0000 mg | ORAL_TABLET | Freq: Four times a day (QID) | ORAL | Status: DC | PRN
  Administered 2020-11-12: 4 mg via ORAL
  Filled 2020-11-08: qty 1

## 2020-11-08 MED ORDER — ACETAMINOPHEN 325 MG PO TABS
650.0000 mg | ORAL_TABLET | Freq: Four times a day (QID) | ORAL | Status: DC | PRN
  Administered 2020-11-08 – 2020-11-09 (×2): 650 mg via ORAL
  Filled 2020-11-08 (×3): qty 2

## 2020-11-08 MED ORDER — PIPERACILLIN-TAZOBACTAM 3.375 G IVPB 30 MIN
3.3750 g | Freq: Once | INTRAVENOUS | Status: AC
  Administered 2020-11-08: 3.375 g via INTRAVENOUS
  Filled 2020-11-08: qty 50

## 2020-11-08 MED ORDER — POTASSIUM CHLORIDE CRYS ER 20 MEQ PO TBCR
20.0000 meq | EXTENDED_RELEASE_TABLET | Freq: Every day | ORAL | Status: DC
  Administered 2020-11-09 – 2020-11-13 (×5): 20 meq via ORAL
  Filled 2020-11-08 (×5): qty 1

## 2020-11-08 MED ORDER — ASPIRIN EC 81 MG PO TBEC
81.0000 mg | DELAYED_RELEASE_TABLET | Freq: Every day | ORAL | Status: DC
  Administered 2020-11-09 – 2020-11-13 (×3): 81 mg via ORAL
  Filled 2020-11-08 (×4): qty 1

## 2020-11-08 NOTE — ED Notes (Signed)
Pt tolerating fluid.

## 2020-11-08 NOTE — Progress Notes (Signed)
Pharmacy Antibiotic Note  Shelia Chan is a 65 y.o. female admitted on 11/08/2020 with hematuria, febrile, abd pain with bilateral nephrostomy tubes.  Pharmacy has been consulted for Zosyn dosing. Hx UCx E coli - ESBL producer  Plan: Zosyn 3.375g IV q8h (4 hour infusion). Monitor renal fx closely  Height: 5\' 2"  (157.5 cm) Weight: 59 kg (130 lb) IBW/kg (Calculated) : 50.1  Temp (24hrs), Avg:98.3 F (36.8 C), Min:98.1 F (36.7 C), Max:98.4 F (36.9 C)  Recent Labs  Lab 11/08/20 1223  WBC 9.3  CREATININE 1.78*    Estimated Creatinine Clearance: 24.6 mL/min (A) (by C-G formula based on SCr of 1.78 mg/dL (H)).    Allergies  Allergen Reactions   Metoprolol Shortness Of Breath    Antimicrobials this admission: 10/23 Zosyn >>   Dose adjustments this admission:  Microbiology results: 10/23 BCx x 1 set: sent 10/23 UCx: sent   Prev Cx 7/16 UCx: E coli, ESBL producer  Thank you for allowing pharmacy to be a part of this patient's care.  Minda Ditto PharmD 11/08/2020 7:24 PM

## 2020-11-08 NOTE — ED Triage Notes (Signed)
[facility] transported pt from home and reports the following. Pt colostomy bag changed 3 days ago, and she began bleeding into the colostomy bag. Pt reporting no appette and fatigue. 24 respirations and temp 101.2. Nausea and vomiting during transport.

## 2020-11-08 NOTE — H&P (Addendum)
History and Physical   Shelia Chan GYK:[id number] DOB: September 09, 1955 DOA: 11/08/2020  PCP: [name], MD   Patient coming from: Home  Chief Complaint: Hematuria, abdominal pain, nausea  HPI: Shelia Chan is a 65 y.o. female with medical history significant of CKD 4, SBO, hydronephrosis, asthma, CHF, lymphedema, COPD, urethral cancer presenting with hematuria, nausea, abdominal pain.  Patient has significant history of urethral cancer status post cystectomy, ureterectomy, and ileal conduit creation, status post ureteral stenting and now nephroureteral catheter placement.  She has had multiple nephroureteral catheter exchanges and her last one was 2 days ago. Also of note she was admitted in July with UTI found to be ESBL sensitive to meropenem and Zosyn.  For the past couple of days he reports he has had abdominal pain with nausea and vomiting.  She states that she began to notice significant hematuria starting yesterday from her nephrostomy tubes.  The pain she reports is dull with some episodes of sharpness and is located near her urostomy site.  Pain was initially intermittent and now become constant and worsening.  She also reports decreased p.o. intake for the last couple days.  She additionally has reported some chest pain with mild shortness of breath that comes and goes for the last day.  During transport via EMS fever to 101.2 was noted. She denies fevers, chills. Constipation/diarrhea.  ED Course: Vital signs in the ED were stable, blood pressure noted to be in the 793J to 030S systolic.  Lab work-up showed CMP with potassium 3.3, creatinine stable 1.78, glucose 142.  CBC with platelets of 426 which is stable for her.  Troponin negative x2.  Urinalysis hematoma protein, leukocytes, bacteria.  Urine cultures and blood cultures pending.  CT abdomen pelvis showed no hydronephrosis.  Did show mild increased kinking along the left nephroureteral stent.  Right Supple high density  material consistent with blood products versus calcifications.  Also noted was fecalized loops of bowel without dilation near the anastomosis site which could represent slow transit versus early SBO.  Patient received Zosyn, morphine, 40 mEq p.o. KCl in the ED.  Urology was consulted and recommended antibiotics and follow-up in clinic, however given patient's history of ESBL patient is planned for admission until urine cultures demonstrate safety discharging on oral antibiotics.  Review of Systems: As per HPI otherwise all other systems reviewed and are negative.  Past Medical History:  Diagnosis Date   Arthritis    Asthma    Blood transfusion without reported diagnosis    Cancer ([place])    CHF (congestive heart failure) (HCC)    COPD (chronic obstructive pulmonary disease) ([place])    Heart murmur    Immune deficiency disorder [facility])    Renal disorder     Past Surgical History:  Procedure Laterality Date   BLADDER REMOVAL  118/22/2015   FRACTURE SURGERY Left 02/2018   pins   IR CATHETER TUBE CHANGE  04/15/2019   IR CATHETER TUBE CHANGE  04/15/2019   IR EXT NEPHROURETERAL CATH EXCHANGE  07/15/2019   IR EXT NEPHROURETERAL CATH EXCHANGE  10/14/2019   IR EXT NEPHROURETERAL CATH EXCHANGE  10/14/2019   IR EXT NEPHROURETERAL CATH EXCHANGE  01/06/2020   IR EXT NEPHROURETERAL CATH EXCHANGE  03/16/2020   IR EXT NEPHROURETERAL CATH EXCHANGE  05/25/2020   IR EXT NEPHROURETERAL CATH EXCHANGE  07/17/2020   IR EXT NEPHROURETERAL CATH EXCHANGE  07/17/2020   IR EXT NEPHROURETERAL CATH EXCHANGE  08/14/2020   IR EXT NEPHROURETERAL CATH EXCHANGE  09/25/2020  IR EXT NEPHROURETERAL CATH EXCHANGE  11/06/2020    Social History  reports that she quit smoking about 21 months ago. Her smoking use included cigarettes. She has a 50.00 pack-year smoking history. She has never used smokeless tobacco. She reports that she does not currently use alcohol. She reports current drug use. Drug: Marijuana.  Allergies  Allergen  Reactions   Metoprolol Shortness Of Breath    Family History  Problem Relation Age of Onset   Heart disease Maternal Grandfather   Reviewed on admission  Prior to Admission medications   Medication Sig Start Date End Date Taking? Authorizing Provider  albuterol (VENTOLIN HFA) 108 (90 Base) MCG/ACT inhaler Inhale 1 puff into the lungs every 6 (six) hours as needed for shortness of breath. 05/22/20  Yes [provider]  ARIPiprazole (ABILIFY) 30 MG tablet Take 30 mg by mouth daily. 03/19/19  Yes [provider]  aspirin EC 81 MG tablet Take 81 mg by mouth daily.   Yes [provider]  B Complex-C (B-COMPLEX WITH VITAMIN C) tablet Take 1 tablet by mouth daily.   Yes [provider]  Bacillus Coagulans-Inulin (PROBIOTIC) 1-250 BILLION-MG CAPS Take 1 capsule by mouth daily.   Yes [provider]  Cholecalciferol (VITAMIN D-3) 25 MCG (1000 UT) CAPS Take 2,000 Units by mouth daily.   Yes [provider]  DULoxetine (CYMBALTA) 30 MG capsule Take 30 mg by mouth at bedtime. 06/21/20  Yes [provider]  furosemide (LASIX) 40 MG tablet Take 1 tablet (40 mg total) by mouth daily. 06/03/19 11/08/20 Yes O'Neal, Cassie Freer, MD  KLOR-CON M20 20 MEQ tablet TAKE 1 TABLET BY MOUTH EVERY DAY Patient taking differently: Take 20 mEq by mouth daily. 10/28/20  Yes O'Neal, Cassie Freer, MD  Multiple Vitamin (MULTIVITAMIN ADULT PO) Take 1 tablet by mouth daily.   Yes [provider]  vitamin C (ASCORBIC ACID) 500 MG tablet Take 500 mg by mouth daily.   Yes [provider]    Physical Exam: Vitals:   11/08/20 1700 11/08/20 1800 11/08/20 1830 11/08/20 1900  BP: (!) 150/73 (!) 144/65 (!) 151/60 (!) 123/57  Pulse: 63 65 67 (!) 55  Resp: 20 (!) 21 17 20   Temp:      TempSrc:      SpO2: 100% 97% 98% 97%  Weight:      Height:       Physical Exam Constitutional:      Appearance: Normal appearance.     Comments: Elderly female in some  discomfort and mildly lethargic  HENT:     Head: Normocephalic and atraumatic.     Mouth/Throat:     Mouth: Mucous membranes are moist.     Pharynx: Oropharynx is clear.  Eyes:     Extraocular Movements: Extraocular movements intact.     Pupils: Pupils are equal, round, and reactive to light.  Cardiovascular:     Rate and Rhythm: Normal rate and regular rhythm.     Pulses: Normal pulses.     Heart sounds: Normal heart sounds.  Pulmonary:     Effort: Pulmonary effort is normal. No respiratory distress.     Breath sounds: Normal breath sounds.  Abdominal:     General: Bowel sounds are normal. There is no distension.     Palpations: Abdomen is soft.     Tenderness: There is no abdominal tenderness.     Comments: Nephrostomy bag with 2 nephrostomy tubes present, dark/bloody urine.  Musculoskeletal:  General: No swelling or deformity.     Right lower leg: Edema present.     Left lower leg: Edema present.  Skin:    General: Skin is warm and dry.     Coloration: Skin is pale.  Neurological:     General: No focal deficit present.     Mental Status: Mental status is at baseline.   Labs on Admission: I have personally reviewed following labs and imaging studies  CBC: Recent Labs  Lab 11/08/20 1223  WBC 9.3  NEUTROABS 8.6*  HGB 12.3  HCT 37.8  MCV 87.1  PLT 426*    Basic Metabolic Panel: Recent Labs  Lab 11/08/20 1223  NA 137  K 3.3*  CL 99  CO2 27  GLUCOSE 142*  BUN 23  CREATININE 1.78*  CALCIUM 9.4    GFR: Estimated Creatinine Clearance: 24.6 mL/min (A) (by C-G formula based on SCr of 1.78 mg/dL (H)).  Liver Function Tests: Recent Labs  Lab 11/08/20 1223  AST 17  ALT 12  ALKPHOS 102  BILITOT 0.6  PROT 8.0  ALBUMIN 3.9    Urine analysis:    Component Value Date/Time   COLORURINE YELLOW 11/08/2020 1650   APPEARANCEUR CLOUDY (A) 11/08/2020 1650   LABSPEC 1.011 11/08/2020 1650   PHURINE 6.0 11/08/2020 1650   GLUCOSEU NEGATIVE 11/08/2020 1650    HGBUR MODERATE (A) 11/08/2020 1650   BILIRUBINUR NEGATIVE 11/08/2020 1650   KETONESUR NEGATIVE 11/08/2020 1650   PROTEINUR 100 (A) 11/08/2020 1650   NITRITE NEGATIVE 11/08/2020 1650   LEUKOCYTESUR MODERATE (A) 11/08/2020 1650    Radiological Exams on Admission: CT Abdomen Pelvis Wo Contrast  Result Date: 11/08/2020 CLINICAL DATA:  Hematuria, unknown cause EXAM: CT ABDOMEN AND PELVIS WITHOUT CONTRAST TECHNIQUE: Multidetector CT imaging of the abdomen and pelvis was performed following the standard protocol without IV contrast. COMPARISON:  July 17, 2020. FINDINGS: Evaluation is limited by lack of IV contrast. Lower chest: No acute abnormality. Hepatobiliary: Unremarkable noncontrast appearance the liver and gallbladder. Pancreas: No peripancreatic fat stranding. Spleen: Unremarkable. Adrenals/Urinary Tract: Patient is status post cystectomy with diverting urostomy. RIGHT-sided nephroureteral stent is located within the pelvis with tubing coursing along the expected course of the ureter and external to patient. There is low positioning of the LEFT-sided nephroureteral stent along the course of the proximal to mid LEFT ureter with partial kinking of the inferior aspect of the pigtail. Positioning is similar in comparison to prior with increased interval kinking. Resolution of RIGHT-sided hydronephrosis. Unchanged mild prominence of the LEFT renal pelvis. Atrophic appearance of the LEFT kidney. There is a small amount of high density within the superior pole of the RIGHT kidney collecting system (series 5, image 70). Hemorrhagic or proteinaceous cysts of the superior pole of the LEFT kidney. Unchanged appearance of the adrenal glands. Stomach/Bowel: No definite evidence of bowel obstruction. RIGHT lower quadrant ileal conduit. There are several fecalized loops of small bowel without frank dilation within the pelvis which could reflect slow transit. Transition point is near the surgical anastomoses.  Vascular/Lymphatic: Multiple surgical clips along the pelvic sidewalls. Evaluation for lymphadenopathy is limited due to lack of IV contrast and lack of intra-abdominal fat. No definitive new lymphadenopathy identified. Reproductive: Status post hysterectomy. Other: No free air. Musculoskeletal: Status post pinning of the LEFT femur. IMPRESSION: 1. No hydronephrosis bilaterally. There is persistent positioning of the LEFT nephroureteral stent in the proximal ureter. There is mildly increased kinking along its course in comparison to prior. 2. There is subtle high  density material in the superior pole of the RIGHT kidney. This could reflect blood products versus medullary calcifications. 3. There are several fecalized loops of bowel without frank dilation within the pelvis near the anastomosis which could reflect slow transit versus early bowel obstruction. Aortic Atherosclerosis (ICD10-I70.0). Electronically Signed   By: Valentino Saxon M.D.   On: 11/08/2020 14:53    EKG: Independently reviewed.  Sinus rhythm at 59 bpm.  Minimal baseline wander.  Assessment/Plan Principal Problem:   UTI (urinary tract infection) Active Problems:   CKD (chronic kidney disease), stage IV (HCC)   Hematuria   Asthma, chronic, unspecified asthma severity, with acute exacerbation   Edema  Urinary tract infection History of ESBL E. coli infection History of urethral cancer status post cystectomy, ureterectomy, ileal conduit creation, ureteral stent, nephroureteral catheter placement. > Presenting with abdominal pain, nausea and hematuria.  Fever noted by EMS but not in ED.  No white count.  Urinalysis is dirty with hemoglobin, protein leukocytes and bacteria however this could be consistent with normal for nephroureteral stents but with her symptoms and recorded fever by EMS treating for UTI. > Considering her history of ESBL with limited options for sensitivities has been started on Zosyn and will continue with this  with patient admitted for monitoring while we follow-up urine cultures to determine sensitivities possible current infection. > Did have nephroureteral catheter exchanged 2 days ago.  CT does show some mild kinking on the left nephroureteral stent and high density material on the right concerning for blood products versus calcifications. > Urology initially consulted in the ED recommending clinic follow-up with antibiotics but due to issues with antibiotics as above will likely benefit from urology consult while she is here.  They have not yet been reconsulted. - Recommend a.m. urology consult - Continue with IV Zosyn for presumed UTI and history of ESBL UTI - Monitor fever curve and white count  Hematuria > Hemoglobin level in the ED is stable and reassuring.  However patient states she is feeling weaker and is concerned as she is continue to have significant bloody appearing output which I also noted. - Monitor on telemetry overnight - We will hold off on any prophylactic anticoagulation and order SCDs, will check additional CBC overnight.    CKD4 Hypokalemia > Creatinine stable 1.78 > Potassium mildly low at 3.3 in the ED.  Has already received 40 mEq p.o. potassium. - Check magnesium level - Avoid nephrotoxic agents - Trend renal function electrolytes  Lymphedema CHF > Has history of CHF listed however last echo with EF 60-65% with G1 DD and normal RV function.  Last cardiology visit.  Her edema to lymphedema. - Continue Lasix and supplemental potassium  Asthma - Continue home albuterol  DVT prophylaxis: SCDs  Code Status:   DNR, she states her family is aware this is her wishes. Family Communication:  Family updated by phone, Hosie Poisson.  Disposition Plan:   Patient is from:  Home  Anticipated DC to:  Home  Anticipated DC date:  1 to 3 days  Anticipated DC barriers: None  Consults called:  Urology consulted in the ED but signed off.  Will need inpatient urology consult in  the morning as original recommendation include her following up in clinic.  Admission status:  Observation, Telemetry   Severity of Illness: The appropriate patient status for this patient is OBSERVATION. Observation status is judged to be reasonable and necessary in order to provide the required intensity of service to ensure the patient's  safety. The patient's presenting symptoms, physical exam findings, and initial radiographic and laboratory data in the context of their medical condition is felt to place them at decreased risk for further clinical deterioration. Furthermore, it is anticipated that the patient will be medically stable for discharge from the hospital within 2 midnights of admission.    Marcelyn Bruins MD [facility]  How to contact the [facility] Attending or Consulting provider [place] or covering provider during after hours [place], for this patient?   Check the care team in [facility] and look for a) attending/consulting [facility] provider listed and b) the [facility] team listed Log into [email or web address] and use [facility] universal password to access. If you do not have the password, please contact the hospital operator. Locate the [facility] provider you are looking for under [facility] and page to a number that you can be directly reached. If you still have difficulty reaching the provider, please page the [facility] (Director on Call) for the Hospitalists listed on amion for assistance.  11/08/2020, 7:23 PM

## 2020-11-08 NOTE — ED Provider Notes (Signed)
[facility] DEPT Provider Note   CSN: [id number] Arrival date & time: 11/08/20  1104     History Chief Complaint  Patient presents with   Hematuria    Shelia Chan is a 65 y.o. female. With past medical history significant for CHF, bladder cancer s/p cystectomy now s/p bilateral nephrostomy tube placement who presents to the emergency department with hematuria.   Patient states she has had abdominal pain, nausea and vomiting for "48 hours" now. States that yesterday she began having blood from her urostomy tubes. Describes abdominal pain as dull with episodes of sharpness. Pain is near urostomy site. It was initially intermittent but now has become constant and worsening. Associated with nausea and vomiting at home. She states she did not have fever at home, however, EMS reports febrile to 101.2. She has also had decreased PO intake over the past two days. She also states last night she began having central, non-radiating chest pain with mild shortness of breath. Denies syncope, palpitations, cough, recent sick contacts. She has history of CHF with chronic lower extremity edema.   On chart review it appears she was seen by Dr. Pascal Lux with interventional radiology on 11/06/20 for bilateral nephroureteral catheter exchange which was completed without immediate complications.   HPI     Past Medical History:  Diagnosis Date   Arthritis    Asthma    Blood transfusion without reported diagnosis    Cancer ([place])    CHF (congestive heart failure) ([facility])    COPD (chronic obstructive pulmonary disease) ([place])    Heart murmur    Immune deficiency disorder ([place])    Renal disorder     Patient Active Problem List   Diagnosis Date Noted   Hydronephrosis 07/17/2020   Small bowel obstruction ([place]) 04/08/2019   CKD (chronic kidney disease), stage IV ([place]) 04/08/2019    Past Surgical History:  Procedure Laterality Date   BLADDER REMOVAL  118/22/2015   FRACTURE  SURGERY Left 02/2018   pins   IR CATHETER TUBE CHANGE  04/15/2019   IR CATHETER TUBE CHANGE  04/15/2019   IR EXT NEPHROURETERAL CATH EXCHANGE  07/15/2019   IR EXT NEPHROURETERAL CATH EXCHANGE  10/14/2019   IR EXT NEPHROURETERAL CATH EXCHANGE  10/14/2019   IR EXT NEPHROURETERAL CATH EXCHANGE  01/06/2020   IR EXT NEPHROURETERAL CATH EXCHANGE  03/16/2020   IR EXT NEPHROURETERAL CATH EXCHANGE  05/25/2020   IR EXT NEPHROURETERAL CATH EXCHANGE  07/17/2020   IR EXT NEPHROURETERAL CATH EXCHANGE  07/17/2020   IR EXT NEPHROURETERAL CATH EXCHANGE  08/14/2020   IR EXT NEPHROURETERAL CATH EXCHANGE  09/25/2020   IR EXT NEPHROURETERAL CATH EXCHANGE  11/06/2020     OB History   No obstetric history on file.     Family History  Problem Relation Age of Onset   Heart disease Maternal Grandfather     Social History   Tobacco Use   Smoking status: Former    Packs/day: 1.00    Years: 50.00    Pack years: 50.00    Types: Cigarettes    Quit date: 01/21/2019    Years since quitting: 1.8   Smokeless tobacco: Never  Vaping Use   Vaping Use: Never used  Substance Use Topics   Alcohol use: Not Currently   Drug use: Yes    Types: Marijuana    Comment: occasional     Home Medications Prior to Admission medications   Medication Sig Start Date End Date Taking? Authorizing Provider  acetaminophen (  TYLENOL) 500 MG tablet Take 500-1,000 mg by mouth every 6 (six) hours as needed for mild pain or headache.    [provider]  albuterol (VENTOLIN HFA) 108 (90 Base) MCG/ACT inhaler Inhale 1 puff into the lungs every 6 (six) hours as needed for shortness of breath. 05/22/20   [provider]  ARIPiprazole (ABILIFY) 30 MG tablet Take 30 mg by mouth daily. 03/19/19   [provider]  aspirin EC 81 MG tablet Take 81 mg by mouth daily.    [provider]  atorvastatin (LIPITOR) 10 MG tablet Take 10 mg by mouth at bedtime.  Patient not taking: Reported on 09/23/2020 02/18/19   [provider]  calcitRIOL (ROCALTROL) 0.25 MCG capsule Take 0.25 mcg by mouth daily. 02/18/19   [provider]  Cholecalciferol-Vitamin C (VITAMIN D3-VITAMIN C PO) Take 1 each by mouth daily. Gummy vitamin    [provider]  DULoxetine (CYMBALTA) 30 MG capsule Take 30 mg by mouth at bedtime. 06/21/20   [provider]  famotidine (PEPCID) 20 MG tablet Take 20 mg by mouth daily as needed for heartburn. 08/21/19   [provider]  furosemide (LASIX) 40 MG tablet Take 1 tablet (40 mg total) by mouth daily. 06/03/19 07/17/20  Geralynn Rile, MD  KLOR-CON M20 20 MEQ tablet TAKE 1 TABLET BY MOUTH EVERY DAY 10/28/20   O'Neal, Cassie Freer, MD  methocarbamol (ROBAXIN) 500 MG tablet Take 500 mg by mouth at bedtime as needed (restless legs/night kicks). Patient not taking: Reported on 09/23/2020 03/05/20   [provider]  Multiple Vitamin (MULTIVITAMIN ADULT PO) Take 1 tablet by mouth daily.    [provider]  traZODone (DESYREL) 100 MG tablet Take 100 mg by mouth at bedtime as needed for sleep. Patient not taking: Reported on 09/23/2020 03/19/19   [provider]    Allergies    Metoprolol  Review of Systems   Review of Systems  Constitutional:  Positive for fever.  Respiratory:  Negative for cough.   Cardiovascular:  Positive for chest pain and leg swelling. Negative for palpitations.  Gastrointestinal:  Positive for abdominal pain, nausea and vomiting.  Genitourinary:  Positive for hematuria. Negative for flank pain.  All other systems reviewed and are negative.  Physical Exam Updated Vital Signs BP (!) 125/52   Pulse (!) 51   Temp 98.4 F (36.9 C) (Rectal)   Resp (!) 26   Ht 5\' 2"  (1.575 m)   Wt 59 kg   SpO2 97%   BMI 23.78 kg/m   Physical Exam Vitals and nursing note reviewed.  Constitutional:      General: She is not in acute distress.    Appearance: Normal appearance. She is ill-appearing.  HENT:     Head:  Normocephalic and atraumatic.     Nose: Nose normal.     Mouth/Throat:     Mouth: Mucous membranes are moist.     Pharynx: Oropharynx is clear.  Eyes:     General: No scleral icterus.    Extraocular Movements: Extraocular movements intact.     Pupils: Pupils are equal, round, and reactive to light.  Cardiovascular:     Rate and Rhythm: Regular rhythm. Bradycardia present.     Pulses:          Radial pulses are 1+ on the right side and 1+ on the left side.     Heart sounds: Normal heart sounds. No murmur heard. Pulmonary:     Effort:  Pulmonary effort is normal. No respiratory distress.     Breath sounds: Examination of the right-lower field reveals decreased breath sounds. Examination of the left-lower field reveals decreased breath sounds. Decreased breath sounds present.  Abdominal:     General: Bowel sounds are normal. There is no distension.     Palpations: Abdomen is soft.     Tenderness: There is abdominal tenderness. There is no right CVA tenderness, left CVA tenderness, guarding or rebound.    Musculoskeletal:        General: Normal range of motion.     Cervical back: Normal range of motion and neck supple.     Right lower leg: 3+ Edema present.     Left lower leg: 3+ Edema present.  Skin:    General: Skin is warm and dry.     Capillary Refill: Capillary refill takes less than 2 seconds.  Neurological:     General: No focal deficit present.     Mental Status: She is alert and oriented to person, place, and time. Mental status is at baseline.  Psychiatric:        Mood and Affect: Mood normal.        Behavior: Behavior normal.    ED Results / Procedures / Treatments   Labs (all labs ordered are listed, but only abnormal results are displayed) Labs Reviewed  COMPREHENSIVE METABOLIC PANEL - Abnormal; Notable for the following components:      Result Value   Potassium 3.3 (*)    Glucose, Bld 142 (*)    Creatinine, Ser 1.78 (*)    GFR, Estimated 31 (*)    All other  components within normal limits  CBC WITH DIFFERENTIAL/PLATELET - Abnormal; Notable for the following components:   Platelets 426 (*)    Neutro Abs 8.6 (*)    Lymphs Abs 0.4 (*)    All other components within normal limits  URINALYSIS, ROUTINE W REFLEX MICROSCOPIC - Abnormal; Notable for the following components:   APPearance CLOUDY (*)    Hgb urine dipstick MODERATE (*)    Protein, ur 100 (*)    Leukocytes,Ua MODERATE (*)    RBC / HPF >50 (*)    Bacteria, UA MANY (*)    All other components within normal limits  URINE CULTURE  CULTURE, BLOOD (ROUTINE X 2)  CULTURE, BLOOD (ROUTINE X 2)  TROPONIN I (HIGH SENSITIVITY)  TROPONIN I (HIGH SENSITIVITY)   EKG EKG Interpretation  Date/Time:  Sunday November 08 2020 11:16:13 EDT Ventricular Rate:  59 PR Interval:  194 QRS Duration: 95 QT Interval:  446 QTC Calculation: 442 R Axis:   57 Text Interpretation: Sinus rhythm Confirmed by Lajean Saver [phone]) on 11/08/2020 3:11:17 PM  Radiology CT Abdomen Pelvis Wo Contrast  Result Date: 11/08/2020 CLINICAL DATA:  Hematuria, unknown cause EXAM: CT ABDOMEN AND PELVIS WITHOUT CONTRAST TECHNIQUE: Multidetector CT imaging of the abdomen and pelvis was performed following the standard protocol without IV contrast. COMPARISON:  July 17, 2020. FINDINGS: Evaluation is limited by lack of IV contrast. Lower chest: No acute abnormality. Hepatobiliary: Unremarkable noncontrast appearance the liver and gallbladder. Pancreas: No peripancreatic fat stranding. Spleen: Unremarkable. Adrenals/Urinary Tract: Patient is status post cystectomy with diverting urostomy. RIGHT-sided nephroureteral stent is located within the pelvis with tubing coursing along the expected course of the ureter and external to patient. There is low positioning of the LEFT-sided nephroureteral stent along the course of the proximal to mid LEFT ureter with partial kinking of the inferior aspect of the  pigtail. Positioning is similar in  comparison to prior with increased interval kinking. Resolution of RIGHT-sided hydronephrosis. Unchanged mild prominence of the LEFT renal pelvis. Atrophic appearance of the LEFT kidney. There is a small amount of high density within the superior pole of the RIGHT kidney collecting system (series 5, image 70). Hemorrhagic or proteinaceous cysts of the superior pole of the LEFT kidney. Unchanged appearance of the adrenal glands. Stomach/Bowel: No definite evidence of bowel obstruction. RIGHT lower quadrant ileal conduit. There are several fecalized loops of small bowel without frank dilation within the pelvis which could reflect slow transit. Transition point is near the surgical anastomoses. Vascular/Lymphatic: Multiple surgical clips along the pelvic sidewalls. Evaluation for lymphadenopathy is limited due to lack of IV contrast and lack of intra-abdominal fat. No definitive new lymphadenopathy identified. Reproductive: Status post hysterectomy. Other: No free air. Musculoskeletal: Status post pinning of the LEFT femur. IMPRESSION: 1. No hydronephrosis bilaterally. There is persistent positioning of the LEFT nephroureteral stent in the proximal ureter. There is mildly increased kinking along its course in comparison to prior. 2. There is subtle high density material in the superior pole of the RIGHT kidney. This could reflect blood products versus medullary calcifications. 3. There are several fecalized loops of bowel without frank dilation within the pelvis near the anastomosis which could reflect slow transit versus early bowel obstruction. Aortic Atherosclerosis (ICD10-I70.0). Electronically Signed   By: Valentino Saxon M.D.   On: 11/08/2020 14:53    Procedures Procedures   Medications Ordered in ED Medications  morphine 4 MG/ML injection 4 mg (has no administration in time range)  piperacillin-tazobactam (ZOSYN) IVPB 3.375 g (has no administration in time range)  ondansetron (ZOFRAN) injection 4 mg  (4 mg Intravenous Given 11/08/20 1321)  potassium chloride SA (KLOR-CON) CR tablet 40 mEq (40 mEq Oral Given 11/08/20 1656)   ED Course  I have reviewed the triage vital signs and the nursing notes.  Pertinent labs & imaging results that were available during my care of the patient were reviewed by me and considered in my medical decision making (see chart for details).    MDM Rules/Calculators/A&P 65 year old female who presents to the emergency department with abdominal pain, nausea and vomiting.   CBC without leukocytosis  CMP with K+ 3.3 -->replacing, Cr 1.78 which is near baseline creatinine.  Regarding chest pain, troponin x2 negative and EKG with ischemia or infarction. Doubt ACS.   UA is consistent with increased leukocytes and many bacteria consistent with UTI however she has ileal conduit which can be expected.  No serum leukocytosis, however patient has reported fever with EMS so concern for UTI vs. Sepsis given recent stent exchange and procedure on 11/06/20. Will cover with IV Zosyn in emergency department.  CT A/P obtained which shows left mildly increased kinking along the nephroureteral stent course, some to high density material in the superior pole of the right kidney which may be blood versus calcification.  I spoke with Dr. Alinda Money with urology who recommends antibiotic coverage at this time. Given Zofran without relief of symptoms.  Continues to have abdominal pain so given pain medication at this time will reassess.  I have reviewed her previous urine cultures which have been positive for E. coli with multidrug resistance leaving minimal options other than IV antibiotics.  Given that she had a recent procedure urine fever will obtain blood cultures.  Patient requires inpatient management for IV antibiotic treatment. Handoff given to Wyn Quaker, PA-C. At time of hand off patient is  awaiting call back from hospitalist for admission.  Final Clinical Impression(s) /  ED Diagnoses Final diagnoses:  Gross hematuria   Rx / DC Orders ED Discharge Orders     None        Mickie Hillier, PA-C 11/08/20 1845    Lajean Saver, MD 11/10/20 [phone]

## 2020-11-09 ENCOUNTER — Encounter (HOSPITAL_COMMUNITY): Payer: Self-pay | Admitting: Internal Medicine

## 2020-11-09 DIAGNOSIS — R310 Gross hematuria: Secondary | ICD-10-CM | POA: Diagnosis present

## 2020-11-09 DIAGNOSIS — T83511A Infection and inflammatory reaction due to indwelling urethral catheter, initial encounter: Secondary | ICD-10-CM | POA: Diagnosis present

## 2020-11-09 DIAGNOSIS — Z8619 Personal history of other infectious and parasitic diseases: Secondary | ICD-10-CM | POA: Diagnosis not present

## 2020-11-09 DIAGNOSIS — J45901 Unspecified asthma with (acute) exacerbation: Secondary | ICD-10-CM | POA: Diagnosis not present

## 2020-11-09 DIAGNOSIS — N390 Urinary tract infection, site not specified: Secondary | ICD-10-CM | POA: Diagnosis present

## 2020-11-09 DIAGNOSIS — I5032 Chronic diastolic (congestive) heart failure: Secondary | ICD-10-CM | POA: Diagnosis present

## 2020-11-09 DIAGNOSIS — N1330 Unspecified hydronephrosis: Secondary | ICD-10-CM | POA: Diagnosis present

## 2020-11-09 DIAGNOSIS — I890 Lymphedema, not elsewhere classified: Secondary | ICD-10-CM | POA: Diagnosis present

## 2020-11-09 DIAGNOSIS — Z906 Acquired absence of other parts of urinary tract: Secondary | ICD-10-CM | POA: Diagnosis not present

## 2020-11-09 DIAGNOSIS — R197 Diarrhea, unspecified: Secondary | ICD-10-CM | POA: Diagnosis present

## 2020-11-09 DIAGNOSIS — T83512A Infection and inflammatory reaction due to nephrostomy catheter, initial encounter: Secondary | ICD-10-CM | POA: Diagnosis not present

## 2020-11-09 DIAGNOSIS — J449 Chronic obstructive pulmonary disease, unspecified: Secondary | ICD-10-CM | POA: Diagnosis present

## 2020-11-09 DIAGNOSIS — Z436 Encounter for attention to other artificial openings of urinary tract: Secondary | ICD-10-CM | POA: Diagnosis not present

## 2020-11-09 DIAGNOSIS — Z87440 Personal history of urinary (tract) infections: Secondary | ICD-10-CM | POA: Diagnosis not present

## 2020-11-09 DIAGNOSIS — A419 Sepsis, unspecified organism: Secondary | ICD-10-CM | POA: Diagnosis not present

## 2020-11-09 DIAGNOSIS — E876 Hypokalemia: Secondary | ICD-10-CM | POA: Diagnosis present

## 2020-11-09 DIAGNOSIS — K5900 Constipation, unspecified: Secondary | ICD-10-CM | POA: Diagnosis present

## 2020-11-09 DIAGNOSIS — Z8249 Family history of ischemic heart disease and other diseases of the circulatory system: Secondary | ICD-10-CM | POA: Diagnosis not present

## 2020-11-09 DIAGNOSIS — T83593A Infection and inflammatory reaction due to other urinary stents, initial encounter: Secondary | ICD-10-CM | POA: Diagnosis present

## 2020-11-09 DIAGNOSIS — B965 Pseudomonas (aeruginosa) (mallei) (pseudomallei) as the cause of diseases classified elsewhere: Secondary | ICD-10-CM | POA: Diagnosis present

## 2020-11-09 DIAGNOSIS — R112 Nausea with vomiting, unspecified: Secondary | ICD-10-CM | POA: Diagnosis present

## 2020-11-09 DIAGNOSIS — Z87891 Personal history of nicotine dependence: Secondary | ICD-10-CM | POA: Diagnosis not present

## 2020-11-09 DIAGNOSIS — N184 Chronic kidney disease, stage 4 (severe): Secondary | ICD-10-CM | POA: Diagnosis present

## 2020-11-09 DIAGNOSIS — Z66 Do not resuscitate: Secondary | ICD-10-CM | POA: Diagnosis present

## 2020-11-09 LAB — BASIC METABOLIC PANEL
Anion gap: 11 (ref 5–15)
BUN: 23 mg/dL (ref 8–23)
CO2: 25 mmol/L (ref 22–32)
Calcium: 8.5 mg/dL — ABNORMAL LOW (ref 8.9–10.3)
Chloride: 100 mmol/L (ref 98–111)
Creatinine, Ser: 1.69 mg/dL — ABNORMAL HIGH (ref 0.44–1.00)
GFR, Estimated: 33 mL/min — ABNORMAL LOW (ref >60)
Glucose, Bld: 119 mg/dL — ABNORMAL HIGH (ref 70–99)
Potassium: 3.4 mmol/L — ABNORMAL LOW (ref 3.5–5.1)
Sodium: 136 mmol/L (ref 135–145)

## 2020-11-09 LAB — CBC
HCT: 36.4 % (ref 36.0–46.0)
Hemoglobin: 11.8 g/dL — ABNORMAL LOW (ref 12.0–15.0)
MCH: 29.2 pg (ref 26.0–34.0)
MCHC: 32.4 g/dL (ref 30.0–36.0)
MCV: 90.1 fL (ref 80.0–100.0)
Platelets: 354 10*3/uL (ref 150–400)
RBC: 4.04 MIL/uL (ref 3.87–5.11)
RDW: 14.7 % (ref 11.5–15.5)
WBC: 11.2 10*3/uL — ABNORMAL HIGH (ref 4.0–10.5)
nRBC: 0 % (ref 0.0–0.2)

## 2020-11-09 LAB — URINE CULTURE

## 2020-11-09 MED ORDER — MELATONIN 3 MG PO TABS
3.0000 mg | ORAL_TABLET | Freq: Once | ORAL | Status: AC
  Administered 2020-11-09: 3 mg via ORAL
  Filled 2020-11-09: qty 1

## 2020-11-09 MED ORDER — SODIUM CHLORIDE 0.9 % IV SOLN
6.2500 mg | Freq: Four times a day (QID) | INTRAVENOUS | Status: DC | PRN
  Administered 2020-11-09 – 2020-11-12 (×5): 6.25 mg via INTRAVENOUS
  Filled 2020-11-09 (×10): qty 0.25

## 2020-11-09 MED ORDER — LACTATED RINGERS IV SOLN: INTRAVENOUS | Status: DC

## 2020-11-09 MED ORDER — HYDROCODONE-ACETAMINOPHEN 5-325 MG PO TABS
1.0000 | ORAL_TABLET | Freq: Once | ORAL | Status: AC
  Administered 2020-11-09: 1 via ORAL
  Filled 2020-11-09: qty 1

## 2020-11-09 MED ORDER — HYDROCODONE-ACETAMINOPHEN 5-325 MG PO TABS
1.0000 | ORAL_TABLET | ORAL | Status: DC | PRN
  Administered 2020-11-10 – 2020-11-12 (×7): 2 via ORAL
  Administered 2020-11-12: 1 via ORAL
  Filled 2020-11-09 (×4): qty 2
  Filled 2020-11-09: qty 1
  Filled 2020-11-09 (×3): qty 2

## 2020-11-09 MED ORDER — MORPHINE SULFATE (PF) 2 MG/ML IV SOLN
1.0000 mg | INTRAVENOUS | Status: DC | PRN
  Administered 2020-11-09 – 2020-11-11 (×4): 1 mg via INTRAVENOUS
  Filled 2020-11-09 (×4): qty 1

## 2020-11-09 NOTE — Progress Notes (Signed)
The patient reports n/v over night. Zofran was administered and symptoms have not resolved. Provider contacted to request alternative.

## 2020-11-09 NOTE — Progress Notes (Signed)
Patient complains of abdominal pain 7/10. Hospitalist Provider assessed. V.O received to administer Hydrocodone 5-325 mg once. Will continue to monitor and update provider.

## 2020-11-09 NOTE — Progress Notes (Signed)
PROGRESS NOTE    Shelia Chan  ZOX:[id number] DOB: 09/22/1955 DOA: 11/08/2020 PCP: Ellyn Hack, MD   Brief Narrative:  Shelia Chan is a 65 y.o. female with medical history significant of CKD 4, SBO, hydronephrosis, asthma, CHF, lymphedema, COPD, s/p cystectomy, ureterectomy, and ileal conduit creation, status post ureteral stenting and newly placed urethral catheter 11/06/20 presenting with hematuria, nausea, abdominal pain.  Presents with worsening bleeding and abdominal pain over the past 48 hours.  In the ED urine was noted to be cloudy and bloody concerning for possible concurrent UTI, recent history of ESBL sensitive to meropenem and Zosyn earlier this year in July. Given history will admit patient for treatment of UTI with urology consult for definitive care and evaluation of recent nephroureteral catheter placement.   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  Otherwise outpatient urology follow-up as previously scheduled -Continue Zosyn given ESBL history, follow repeat cultures, de-escalate as appropriate   Hematuria -Somewhat complicated history as above, urology sidelined, recommending antibiotics, hematuria likely concurrent from recent tube exchange as well as infection. -Monitor H&H currently stable, hold DVT prophylaxis   CKD4 Hypokalemia -Creatinine stable at baseline -Continue to follow urine output as above    Lymphedema CHF -Continue home Lasix/potassium   Asthma - Continue home albuterol   DVT prophylaxis:      SCDs  Code Status:              DNR, she states her family is aware this is her wishes Family Communication: None present  Status is: Inpatient  Dispo: The patient is from: Home              Anticipated d/c is to: To be determined              Anticipated d/c date is: 48 to 72 hours              Patient currently not medically stable for discharge  Consultants:  None  Procedures:  None  Antimicrobials:  Zosyn 11/08/2020, ongoing  Subjective: No acute issues or events overnight, abdominal pain and hematuria ongoing, more well controlled today on hydrocodone otherwise denies nausea vomiting diarrhea constipation headache fevers chills or chest pain  Objective: Vitals:   11/08/20 1900 11/08/20 2100 11/09/20 0356 11/09/20 0400  BP: (!) 123/57 (!) 110/95 (!) 115/50   Pulse: (!) 55 65 66   Resp: 20  17   Temp:  97.8 F (36.6 C) 99 F (37.2 C)   TempSrc:  Oral Oral   SpO2: 97% 100% 98%   Weight:    58.7 kg  Height:        Intake/Output Summary (Last 24 hours) at 11/09/2020 0709 Last data filed at 11/09/2020 0600 Gross per 24 hour  Intake 34.3 ml  Output 900 ml  Net -865.7 ml   Filed Weights   11/08/20 1141 11/09/20 0400  Weight: 59 kg 58.7 kg    Examination:  General exam: Appears calm and comfortable  Respiratory system: Clear to auscultation. Respiratory effort normal. Cardiovascular system: S1 & S2 heard, RRR.  No JVD, murmurs, rubs, gallops or clicks. No pedal edema. Gastrointestinal system: Postsurgical changes noted, nephrostomy bag and nephrostomy tubes present with profound bloody somewhat cloudy urine. Central nervous system: Alert and oriented. No focal neurological deficits. Extremities: Symmetric 5 x 5 power. Skin: No rashes, lesions Psychiatry: Judgement and insight appear normal. Mood & affect appropriate.   Data Reviewed: I have  personally reviewed following labs and imaging studies  CBC: Recent Labs  Lab 11/08/20 1223 11/08/20 2322 11/09/20 0438  WBC 9.3 12.8* 11.2*  NEUTROABS 8.6*  --   --   HGB 12.3 11.0* 11.8*  HCT 37.8 33.8* 36.4  MCV 87.1 89.2 90.1  PLT 426* 321 354   Basic Metabolic Panel: Recent Labs  Lab 11/08/20 1223 11/08/20 1424 11/09/20 0438  NA 137  --  136  K 3.3*  --  3.4*  CL 99  --  100  CO2 27  --  25  GLUCOSE 142*  --  119*  BUN 23  --  23  CREATININE 1.78*  --  1.69*  CALCIUM 9.4  --  8.5*  MG  --  2.2  --    GFR: Estimated Creatinine Clearance: 25.9 mL/min (A) (by C-G formula based on SCr of 1.69 mg/dL (H)). Liver Function Tests: Recent Labs  Lab 11/08/20 1223  AST 17  ALT 12  ALKPHOS 102  BILITOT 0.6  PROT 8.0  ALBUMIN 3.9   No results for input(s): LIPASE, AMYLASE in the last 168 hours. No results for input(s): AMMONIA in the last 168 hours. Coagulation Profile: No results for input(s): INR, PROTIME in the last 168 hours. Cardiac Enzymes: No results for input(s): CKTOTAL, CKMB, CKMBINDEX, TROPONINI in the last 168 hours. BNP (last 3 results) No results for input(s): PROBNP in the last 8760 hours. HbA1C: No results for input(s): HGBA1C in the last 72 hours. CBG: No results for input(s): GLUCAP in the last 168 hours. Lipid Profile: No results for input(s): CHOL, HDL, LDLCALC, TRIG, CHOLHDL, LDLDIRECT in the last 72 hours. Thyroid Function Tests: No results for input(s): TSH, T4TOTAL, FREET4, T3FREE, THYROIDAB in the last 72 hours. Anemia Panel: No results for input(s): VITAMINB12, FOLATE, FERRITIN, TIBC, IRON, RETICCTPCT in the last 72 hours. Sepsis Labs: No results for input(s): PROCALCITON, LATICACIDVEN in the last 168 hours.  No results found for this or any previous visit (from the past 240 hour(s)).       Radiology Studies: CT Abdomen Pelvis Wo Contrast  Result Date: 11/08/2020 CLINICAL DATA:  Hematuria, unknown cause EXAM: CT ABDOMEN AND  PELVIS WITHOUT CONTRAST TECHNIQUE: Multidetector CT imaging of the abdomen and pelvis was performed following the standard protocol without IV contrast. COMPARISON:  July 17, 2020. FINDINGS: Evaluation is limited by lack of IV contrast. Lower chest: No acute abnormality. Hepatobiliary: Unremarkable noncontrast appearance the liver and gallbladder. Pancreas: No peripancreatic fat stranding. Spleen: Unremarkable. Adrenals/Urinary Tract: Patient is status post cystectomy with diverting urostomy. RIGHT-sided nephroureteral stent is located within the pelvis with tubing coursing along the expected course of the ureter and external to patient. There is low positioning of the LEFT-sided nephroureteral stent along the course of the proximal to mid LEFT ureter with partial kinking of the inferior aspect of the pigtail. Positioning is similar in comparison to prior with increased interval kinking. Resolution of RIGHT-sided hydronephrosis. Unchanged mild prominence of the LEFT renal pelvis. Atrophic appearance of the LEFT kidney. There is a small amount of high density within the superior pole of the RIGHT kidney collecting system (  series 5, image 70). Hemorrhagic or proteinaceous cysts of the superior pole of the LEFT kidney. Unchanged appearance of the adrenal glands. Stomach/Bowel: No definite evidence of bowel obstruction. RIGHT lower quadrant ileal conduit. There are several fecalized loops of small bowel without frank dilation within the pelvis which could reflect slow transit. Transition point is near the surgical anastomoses. Vascular/Lymphatic: Multiple surgical clips along the pelvic sidewalls. Evaluation for lymphadenopathy is limited due to lack of IV contrast and lack of intra-abdominal fat. No definitive new lymphadenopathy identified. Reproductive: Status post hysterectomy. Other: No free air. Musculoskeletal: Status post pinning of the LEFT femur. IMPRESSION: 1. No hydronephrosis bilaterally. There is persistent  positioning of the LEFT nephroureteral stent in the proximal ureter. There is mildly increased kinking along its course in comparison to prior. 2. There is subtle high density material in the superior pole of the RIGHT kidney. This could reflect blood products versus medullary calcifications. 3. There are several fecalized loops of bowel without frank dilation within the pelvis near the anastomosis which could reflect slow transit versus early bowel obstruction. Aortic Atherosclerosis (ICD10-I70.0). Electronically Signed   By: Meda Klinefelter M.D.   On: 11/08/2020 14:53    Scheduled Meds:  acidophilus  1 capsule Oral Daily   ARIPiprazole  30 mg Oral Daily   vitamin C  500 mg Oral Daily   aspirin EC  81 mg Oral Daily   B-complex with vitamin C  1 tablet Oral Daily   cholecalciferol  2,000 Units Oral Daily   furosemide  40 mg Oral Daily   multivitamin with minerals   Oral Daily   ondansetron (ZOFRAN) IV  4 mg Intravenous Once   potassium chloride SA  20 mEq Oral Daily   sodium chloride flush  3 mL Intravenous Q12H    piperacillin-tazobactam (ZOSYN)  IV 3.375 g (11/09/20 0313)    LOS: 0 days   Time spent:  Azucena Fallen, DO [facility]  If 7PM-7AM, please contact night-coverage [email or web address]  11/09/2020, 7:09 AM

## 2020-11-10 DIAGNOSIS — T83512A Infection and inflammatory reaction due to nephrostomy catheter, initial encounter: Secondary | ICD-10-CM | POA: Diagnosis not present

## 2020-11-10 DIAGNOSIS — R310 Gross hematuria: Secondary | ICD-10-CM | POA: Diagnosis not present

## 2020-11-10 DIAGNOSIS — N184 Chronic kidney disease, stage 4 (severe): Secondary | ICD-10-CM | POA: Diagnosis not present

## 2020-11-10 DIAGNOSIS — J45901 Unspecified asthma with (acute) exacerbation: Secondary | ICD-10-CM | POA: Diagnosis not present

## 2020-11-10 LAB — BASIC METABOLIC PANEL
Anion gap: 9 (ref 5–15)
BUN: 22 mg/dL (ref 8–23)
CO2: 29 mmol/L (ref 22–32)
Calcium: 8.3 mg/dL — ABNORMAL LOW (ref 8.9–10.3)
Chloride: 98 mmol/L (ref 98–111)
Creatinine, Ser: 1.89 mg/dL — ABNORMAL HIGH (ref 0.44–1.00)
GFR, Estimated: 29 mL/min — ABNORMAL LOW (ref >60)
Glucose, Bld: 101 mg/dL — ABNORMAL HIGH (ref 70–99)
Potassium: 3.2 mmol/L — ABNORMAL LOW (ref 3.5–5.1)
Sodium: 136 mmol/L (ref 135–145)

## 2020-11-10 LAB — CBC
HCT: 31.2 % — ABNORMAL LOW (ref 36.0–46.0)
Hemoglobin: 10 g/dL — ABNORMAL LOW (ref 12.0–15.0)
MCH: 28.6 pg (ref 26.0–34.0)
MCHC: 32.1 g/dL (ref 30.0–36.0)
MCV: 89.1 fL (ref 80.0–100.0)
Platelets: 322 10*3/uL (ref 150–400)
RBC: 3.5 MIL/uL — ABNORMAL LOW (ref 3.87–5.11)
RDW: 14.9 % (ref 11.5–15.5)
WBC: 5.2 10*3/uL (ref 4.0–10.5)
nRBC: 0 % (ref 0.0–0.2)

## 2020-11-10 MED ORDER — ALUM & MAG HYDROXIDE-SIMETH 200-200-20 MG/5ML PO SUSP
30.0000 mL | ORAL | Status: DC | PRN
  Administered 2020-11-10 – 2020-11-12 (×2): 30 mL via ORAL
  Filled 2020-11-10 (×3): qty 30

## 2020-11-10 MED ORDER — POLYETHYLENE GLYCOL 3350 17 G PO PACK: 17.0000 g | PACK | Freq: Once | ORAL | Status: AC

## 2020-11-10 NOTE — Progress Notes (Signed)
Pt refused to take vitamins/ASA PO this morning due to nausea/vomiting. Pt was able to tolerate PO Lasix/Abilify/KCl-. Will continue to monitor.

## 2020-11-10 NOTE — Progress Notes (Addendum)
Zofran was given to this patient at 08:49, and after 30 minutes, patient stated that it helped a little but not much. So I came back to hang a second antiemetic. As I was hanging phengran, she threw up yellow fluids (only had milk this morning) and then I continued to hang phengran. MD made aware. Will continue to monitor.

## 2020-11-10 NOTE — Progress Notes (Addendum)
PROGRESS NOTE    Shelia Chan  XBJ:[id number] DOB: 07/15/1955 DOA: 11/08/2020 PCP: Ellyn Hack, MD   Brief Narrative:  Shelia Chan is a 65 y.o. female with medical history significant of CKD 4, SBO, hydronephrosis, asthma, CHF, lymphedema, COPD, s/p cystectomy, ureterectomy, and ileal conduit creation, status post ureteral stenting and newly placed urethral catheter 11/06/20 presenting with hematuria, nausea, abdominal pain.  Presents with worsening bleeding and abdominal pain over the past 48 hours.  In the ED urine was noted to be cloudy and bloody concerning for possible concurrent UTI, recent history of ESBL sensitive to meropenem and Zosyn earlier this year in July. Given history will admit patient for treatment of UTI with urology consult for definitive care and evaluation of recent nephroureteral catheter placement.   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  Otherwise outpatient urology follow-up as previously scheduled - Continue Zosyn given ESBL history -hoping to transition patient to p.o. antibiotics although this may be difficult given previous sensitivities to ESBL with no good PO options (fosfomycin q72h at home may be an option will need to discuss with ID if repeat cultures aren't helpful). - Recollecting urine 11/10/2020 for repeat culture to help direct antibiotics   Hematuria - Somewhat complicated history as above, urology sidelined, recommending ongoing  antibiotics, hematuria likely concurrent from recent tube exchange as well as infection. - Monitor H&H currently stable, hold DVT prophylaxis   Intractable N/V, secondary to above - Improving on zofran/phenergan - Hopeful will resolve with supportive care and treatment of infection as above  CKD4 Hypokalemia - Creatinine appears better than baseline, labs somewhat labile over the past year ranging from 1.8-2.6; GFR around 30 - Continue to follow urine output as above   Lymphedema CHF, not in acute exacerbation -Continue home Lasix/potassium, improving clinically   Asthma - Continue home albuterol   DVT prophylaxis: SCDs  Code Status:  DNR Family Communication: None present  Status is: Inpatient  Dispo: The patient is from: Home              Anticipated d/c is to: To be determined              Anticipated d/c date is: 24-48 hours              Patient currently not medically stable for discharge  Consultants:  None  Procedures:  None  Antimicrobials:  Zosyn 11/08/2020, ongoing  Subjective: No acute issues or events overnight, ongoing nausea without vomiting today, better control with Zofran and Phenergan, abdominal pain improving but not yet back to baseline, somewhat improved today with bowel movement but otherwise denies shortness of breath chest pain vomiting constipation diarrhea headache fevers or chills.  Objective: Vitals:   11/09/20 1621 11/09/20 2138 11/10/20 0450 11/10/20 0457  BP: 109/60 (!) 113/50 (!) 108/53   Pulse: 63 61 (!) 59   Resp: 16 18 18    Temp: 98.7 F (37.1 C) 98.2 F (36.8 C) 98.4 F (36.9 C)   TempSrc: Oral Oral Oral   SpO2: 99% 96% 96%   Weight:  57.4 kg  Height:        Intake/Output Summary (Last 24 hours) at 11/10/2020 0715 Last data filed at 11/10/2020 0526 Gross per 24 hour  Intake 470.49 ml  Output 775 ml  Net -304.51 ml    Filed Weights   11/08/20 1141 11/09/20 0400 11/10/20 0457  Weight: 59 kg 58.7 kg 57.4 kg     Examination:  General exam: Appears calm and comfortable  Respiratory system: Clear to auscultation. Respiratory effort normal. Cardiovascular system: S1 & S2 heard, RRR. No JVD, murmurs, rubs, gallops or clicks. No pedal edema. Gastrointestinal system: Postsurgical changes noted, nephrostomy bag and nephrostomy tubes present with profound bloody somewhat cloudy urine. Central nervous system: Alert and oriented. No focal neurological deficits. Extremities: Symmetric 5 x 5 power. Skin: No rashes, lesions Psychiatry: Judgement and insight appear normal. Mood & affect appropriate.   Data Reviewed: I have personally reviewed following labs and imaging studies  CBC: Recent Labs  Lab 11/08/20 1223 11/08/20 2322 11/09/20 0438 11/10/20 0648  WBC 9.3 12.8* 11.2* 5.2  NEUTROABS 8.6*  --   --   --   HGB 12.3 11.0* 11.8* 10.0*  HCT 37.8 33.8* 36.4 31.2*  MCV 87.1 89.2 90.1 89.1  PLT 426* 321 354 322    Basic Metabolic Panel: Recent Labs  Lab 11/08/20 1223 11/08/20 1424 11/09/20 0438  NA 137  --  136  K 3.3*  --  3.4*  CL 99  --  100  CO2 27  --  25  GLUCOSE 142*  --  119*  BUN 23  --  23  CREATININE 1.78*  --  1.69*  CALCIUM 9.4  --  8.5*  MG  --  2.2  --     GFR: Estimated Creatinine Clearance: 25.9 mL/min (A) (by C-G formula based on SCr of 1.69 mg/dL (H)). Liver Function Tests: Recent Labs  Lab 11/08/20 1223  AST 17  ALT 12  ALKPHOS 102  BILITOT 0.6  PROT 8.0  ALBUMIN 3.9    No results for input(s): LIPASE, AMYLASE in the last 168 hours. No results for input(s): AMMONIA in the last 168 hours. Coagulation Profile: No results for input(s): INR, PROTIME in the last 168 hours. Cardiac Enzymes: No results for input(s): CKTOTAL, CKMB, CKMBINDEX, TROPONINI in the last 168 hours. BNP (last 3 results) No results for input(s): PROBNP in the last 8760 hours. HbA1C: No results for input(s): HGBA1C in the last 72 hours. CBG: No results for input(s): GLUCAP in the  last 168 hours. Lipid Profile: No results for input(s): CHOL, HDL, LDLCALC, TRIG, CHOLHDL, LDLDIRECT in the last 72 hours. Thyroid Function Tests: No results for input(s): TSH, T4TOTAL, FREET4, T3FREE, THYROIDAB in the last 72 hours. Anemia Panel: No results for input(s): VITAMINB12, FOLATE, FERRITIN, TIBC, IRON, RETICCTPCT in the last 72 hours. Sepsis Labs: No results for input(s): PROCALCITON, LATICACIDVEN in the last 168 hours.  Recent Results (from the past 240 hour(s))  Urine Culture     Status: Abnormal   Collection Time: 11/08/20  4:50 PM   Specimen: Urine, Clean Catch  Result Value Ref Range Status   Specimen Description   Final    URINE, CLEAN CATCH Performed at [facility], [address] [place], [place] [address]    Special Requests   Final    NONE Performed at [facility], [address] [place], [place] [address]    Culture MULTIPLE SPECIES PRESENT, SUGGEST RECOLLECTION (A)  Final   Report  Status 11/09/2020 FINAL  Final  Culture, blood (routine x 2)     Status: None (Preliminary result)   Collection Time: 11/08/20  6:11 PM   Specimen: BLOOD  Result Value Ref Range Status   Specimen Description   Final    BLOOD SITE NOT SPECIFIED Performed at [facility], [address], [place], [place] [address]    Special Requests   Final    BOTTLES DRAWN AEROBIC AND ANAEROBIC Blood Culture adequate volume Performed at [facility], [address], [place], [place] [address]    Culture   Final    NO GROWTH < 12 HOURS Performed at [facility], [address] [place], [place] [address]    Report Status PENDING  Incomplete  Culture, blood (routine x 2)     Status: None (Preliminary result)   Collection Time: 11/08/20 11:22 PM   Specimen: BLOOD  Result Value Ref Range Status   Specimen Description   Final    BLOOD BLOOD LEFT HAND Performed at [facility], [address] [place], [place] [address]    Special Requests   Final    BOTTLES DRAWN AEROBIC ONLY Blood Culture adequate volume Performed at [facility], [address] [place], [place] [address]    Culture   Final    NO GROWTH < 12 HOURS Performed at [facility], [address] [place], [place] [address]    Report Status PENDING  Incomplete         Radiology Studies: CT Abdomen Pelvis Wo Contrast  Result Date: 11/08/2020 CLINICAL DATA:  Hematuria, unknown cause EXAM: CT ABDOMEN AND PELVIS WITHOUT CONTRAST TECHNIQUE: Multidetector CT imaging of the abdomen and pelvis was performed following the standard protocol without IV contrast. COMPARISON:  July 17, 2020. FINDINGS: Evaluation is limited by lack of IV contrast. Lower chest: No acute abnormality. Hepatobiliary: Unremarkable noncontrast appearance the liver and gallbladder. Pancreas: No peripancreatic fat stranding. Spleen: Unremarkable. Adrenals/Urinary Tract: Patient is status post cystectomy with diverting urostomy. RIGHT-sided nephroureteral stent is located within the pelvis with tubing coursing along the expected course of the ureter and external to patient. There is low positioning of the LEFT-sided nephroureteral stent along the course of the proximal to mid LEFT ureter with partial kinking of the inferior aspect of the pigtail. Positioning is similar in comparison to prior with increased interval kinking. Resolution of RIGHT-sided hydronephrosis. Unchanged mild prominence of the LEFT renal pelvis. Atrophic appearance of the LEFT kidney. There is a small amount of high density within the superior pole of the RIGHT kidney collecting system (series 5, image 70). Hemorrhagic or proteinaceous cysts of the superior pole of the LEFT kidney. Unchanged appearance of the adrenal glands. Stomach/Bowel: No definite evidence of bowel obstruction. RIGHT lower quadrant ileal conduit. There are several fecalized loops of small  bowel without frank dilation within the pelvis which could reflect slow transit. Transition point is near the surgical anastomoses. Vascular/Lymphatic: Multiple surgical clips along the pelvic sidewalls. Evaluation for lymphadenopathy is limited due to lack of IV contrast and lack of intra-abdominal fat. No definitive new lymphadenopathy identified. Reproductive: Status post hysterectomy. Other: No free air. Musculoskeletal: Status post pinning of the LEFT femur. IMPRESSION: 1. No hydronephrosis bilaterally. There is persistent positioning of the LEFT nephroureteral stent in the proximal ureter. There is mildly increased kinking along its course in comparison to prior. 2. There is subtle high density material in the superior pole of  the RIGHT kidney. This could reflect blood products versus medullary calcifications. 3. There are several fecalized loops of bowel without frank dilation within the pelvis near the anastomosis which could reflect slow transit versus early bowel obstruction. Aortic Atherosclerosis (ICD10-I70.0). Electronically Signed   By: Meda Klinefelter M.D.   On: 11/08/2020 14:53    Scheduled Meds:  acidophilus  1 capsule Oral Daily   ARIPiprazole  30 mg Oral Daily   vitamin C  500 mg Oral Daily   aspirin EC  81 mg Oral Daily   B-complex with vitamin C  1 tablet Oral Daily   cholecalciferol  2,000 Units Oral Daily   furosemide  40 mg Oral Daily   multivitamin with minerals   Oral Daily   ondansetron (ZOFRAN) IV  4 mg Intravenous Once   polyethylene glycol  17 g Oral Once   potassium chloride SA  20 mEq Oral Daily   sodium chloride flush  3 mL Intravenous Q12H    lactated ringers 50 mL/hr at 11/09/20 1651   piperacillin-tazobactam (ZOSYN)  IV 3.375 g (11/10/20 0152)   promethazine (PHENERGAN) injection (IM or IVPB) 6.25 mg (11/09/20 2105)    LOS: 1 day   Time spent:  Azucena Fallen, DO [facility]  If 7PM-7AM, please contact  night-coverage [email or web address]  11/10/2020, 7:15 AM

## 2020-11-11 DIAGNOSIS — T83511A Infection and inflammatory reaction due to indwelling urethral catheter, initial encounter: Secondary | ICD-10-CM | POA: Diagnosis not present

## 2020-11-11 DIAGNOSIS — T83512A Infection and inflammatory reaction due to nephrostomy catheter, initial encounter: Secondary | ICD-10-CM | POA: Diagnosis not present

## 2020-11-11 DIAGNOSIS — N184 Chronic kidney disease, stage 4 (severe): Secondary | ICD-10-CM | POA: Diagnosis not present

## 2020-11-11 DIAGNOSIS — R310 Gross hematuria: Secondary | ICD-10-CM | POA: Diagnosis not present

## 2020-11-11 LAB — BASIC METABOLIC PANEL
Anion gap: 8 (ref 5–15)
BUN: 16 mg/dL (ref 8–23)
CO2: 27 mmol/L (ref 22–32)
Calcium: 8.1 mg/dL — ABNORMAL LOW (ref 8.9–10.3)
Chloride: 98 mmol/L (ref 98–111)
Creatinine, Ser: 1.76 mg/dL — ABNORMAL HIGH (ref 0.44–1.00)
GFR, Estimated: 32 mL/min — ABNORMAL LOW (ref >60)
Glucose, Bld: 102 mg/dL — ABNORMAL HIGH (ref 70–99)
Potassium: 3.2 mmol/L — ABNORMAL LOW (ref 3.5–5.1)
Sodium: 133 mmol/L — ABNORMAL LOW (ref 135–145)

## 2020-11-11 LAB — CBC
HCT: 32.1 % — ABNORMAL LOW (ref 36.0–46.0)
Hemoglobin: 10.2 g/dL — ABNORMAL LOW (ref 12.0–15.0)
MCH: 29 pg (ref 26.0–34.0)
MCHC: 31.8 g/dL (ref 30.0–36.0)
MCV: 91.2 fL (ref 80.0–100.0)
Platelets: 313 10*3/uL (ref 150–400)
RBC: 3.52 MIL/uL — ABNORMAL LOW (ref 3.87–5.11)
RDW: 14.9 % (ref 11.5–15.5)
WBC: 5.5 10*3/uL (ref 4.0–10.5)
nRBC: 0 % (ref 0.0–0.2)

## 2020-11-11 LAB — MAGNESIUM: Magnesium: 2.1 mg/dL (ref 1.7–2.4)

## 2020-11-11 LAB — PHOSPHORUS: Phosphorus: 2.6 mg/dL (ref 2.5–4.6)

## 2020-11-11 MED ORDER — SODIUM CHLORIDE 0.9 % IV SOLN: INTRAVENOUS | Status: DC | PRN

## 2020-11-11 MED ORDER — POTASSIUM CHLORIDE CRYS ER 20 MEQ PO TBCR
40.0000 meq | EXTENDED_RELEASE_TABLET | Freq: Once | ORAL | Status: AC
  Administered 2020-11-11: 40 meq via ORAL
  Filled 2020-11-11: qty 2

## 2020-11-11 NOTE — TOC Initial Note (Signed)
Transition of Care [facility]) - Initial/Assessment Note    Patient Details  Name: Shelia Chan MRN: [id number] Date of Birth: [birth year]  Transition of Care [facility]) CM/SW Contact:    Leeroy Cha, RN Phone Number: 11/11/2020, 9:02 AM  Clinical Narrative:                  65 y.o. 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  She is also had a recent history of ESBL sensitive to meropenem in July.       Assessment/Plan:    Sepsis secondary to complicated UTI present on admission/history of ESBL E. coli: She had a recent ureteral stent exchange on 11/06/2020. Dr. Gilford Rile urology was curb sided who recommended empiric IV antibiotics for 14 days and urine output.,  If urine output decreases consult IR for evaluation and replacement of stent.  Otherwise the patient will follow-up with urology as previously scheduled as an outpatient. 's point in time we will continue IV Zosyn urine culture was inconclusive she was started on IV Zosyn, urine culture were repeated on 11/10/2020 are still pending.   Hematuria: Urology was curb sided recommended IV antibiotics hematuria likely due to recent tube exchange as well as infection. Holding DVT prophylaxis continue to monitor hemoglobin.   Intractable nausea and vomiting secondary to above, Improved with Zofran and Phenergan. Continue supportive treatment.  Chronic kidney stage IV/hypokalemia: With a baseline creatinine around 1.8-2.6.  Has significantly improved, this morning is 1.7 replete electrolytes.   Lymphedema/heart failure not in exacerbation: Continue current home dose of Lasix and potassium supplementation. Consult physical  therapy.  Asthma: Continue albuterol.   TOC PLAN OF CARE: TO RETURN TO HOME WITH SELF CARE lives alone, hx of esrf stage 3, following for progression bld cultures and urine cultures are pending.  Expected Discharge Plan: Home/Self Care Barriers to Discharge: Continued Medical Work up   Patient Goals and CMS Choice Patient states their goals for this hospitalization and ongoing recovery are:: to go home CMS Medicare.gov Compare Post Acute Care list provided to:: Patient    Expected Discharge Plan and Services Expected Discharge Plan: Home/Self Care       Living arrangements for the past 2 months: Single Family Home                                      Prior Living Arrangements/Services Living arrangements for the past 2 months: Single Family Home Lives with:: Self Patient language and need for interpreter reviewed:: Yes Do you feel safe going back to the place where you live?: Yes            Criminal Activity/Legal Involvement Pertinent to Current Situation/Hospitalization: No - Comment as needed  Activities of Daily Living Home Assistive Devices/Equipment: None ADL Screening (condition at time of admission) Patient's cognitive ability adequate to safely complete daily activities?: Yes Is the patient deaf or have difficulty hearing?: No Does the patient have difficulty seeing, even when wearing glasses/contacts?: No Does the patient have difficulty concentrating, remembering, or making decisions?: No Patient able to express need for assistance with ADLs?: Yes Does the patient have difficulty dressing or bathing?: No Independently performs  ADLs?: Yes (appropriate for developmental age) Does the patient have difficulty walking or climbing stairs?: Yes Weakness of Legs: None Weakness of Arms/Hands: None  Permission Sought/Granted                  Emotional Assessment Appearance:: Appears stated age     Orientation: : Oriented to Self, Oriented to  Place, Oriented to  Time, Oriented to Situation Alcohol / Substance Use: Not Applicable Psych Involvement: No (comment)  Admission diagnosis:  UTI (urinary tract infection) [N39.0] Gross hematuria [R31.0] UTI (urinary tract infection) due to urinary indwelling catheter ([place]) [D47.092H, N39.0] Patient Active Problem List   Diagnosis Date Noted   UTI (urinary tract infection) due to urinary indwelling catheter ([place]) 11/09/2020   UTI (urinary tract infection) 11/08/2020   Hematuria 11/08/2020   Asthma, chronic, unspecified asthma severity, with acute exacerbation 11/08/2020   Edema 11/08/2020   Hydronephrosis 07/17/2020   Small bowel obstruction ([place]) 04/08/2019   CKD (chronic kidney disease), stage IV ([place]) 04/08/2019   PCP:  [name], MD Pharmacy:   [id number] - , [place] - [address] [place] [address] Phone: [phone] Fax: [phone]     Social Determinants of Health (SDOH) Interventions    Readmission Risk Interventions No flowsheet data found.

## 2020-11-11 NOTE — Progress Notes (Signed)
Pharmacy Antibiotic Note  Shelia Chan is a 65 y.o. female admitted on 11/08/2020 with hematuria, febrile, abd pain with bilateral nephrostomy tubes.  Pharmacy has been consulted for Zosyn dosing. Hx UCx E coli - ESBL producer. 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 Monitor renal fx closely Consider fosfomycin 3 gm PO q72 hrs x 3 doses to cover ESBL   Height: 5\' 2"  (157.5 cm) Weight: 56.1 kg (123 lb 9.6 oz) IBW/kg (Calculated) : 50.1  Temp (24hrs), Avg:98.3 F (36.8 C), Min:97.9 F (36.6 C), Max:98.8 F (37.1 C)  Recent Labs  Lab 11/08/20 1223 11/08/20 2322 11/09/20 0438 11/10/20 0648 11/11/20 0445  WBC 9.3 12.8* 11.2* 5.2 5.5  CREATININE 1.78*  --  1.69* 1.89* 1.76*     Estimated Creatinine Clearance: 24.9 mL/min (A) (by C-G formula based on SCr of 1.76 mg/dL (H)).    Allergies  Allergen Reactions   Metoprolol Shortness Of Breath   Antimicrobials this admission: 10/23 Zosyn >>  Dose adjustments this admission:  Microbiology results: 10/23 BCx x 2 : ngtd 10/23 UCx: mult sp F 10/25 UCx: sent PTA 08/01/2020 UCx: > 100 K ESBL E coli. Sens. Unasyn, gent, imi, NTF, pip/tazo 07/19/2020 E coli ESBL 07/19/2020 enterococcus faecalis; sens Amp, zyvox. NTF (32), Resistant to vancomycin  Thank you for allowing pharmacy to be a part of this patient's care.  Shelia Chan, Pharm.D 11/11/2020 8:11 AM

## 2020-11-11 NOTE — Progress Notes (Signed)
[facility] PROGRESS NOTE    Progress Note  Shelia Chan  BMW:[id number] DOB: 11-29-1955 DOA: 11/08/2020 PCP: [name], MD     Brief Narrative:   Shelia Chan is an 65 y.o. 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  She is also had a recent history of ESBL sensitive to meropenem in July.    Assessment/Plan:   Sepsis secondary to complicated UTI present on admission/history of ESBL E. coli: She had a recent ureteral stent exchange on 11/06/2020. Dr. Gilford Rile urology was curb sided who recommended empiric IV antibiotics for 14 days and urine output.,  If urine output decreases consult IR for evaluation and replacement of stent.  Otherwise the patient will follow-up with urology as previously scheduled as an outpatient. 's point in time we will continue IV Zosyn urine culture was inconclusive she was started on IV Zosyn, urine culture were repeated on 11/10/2020 are still pending.  Hematuria: Urology was curb sided recommended IV antibiotics hematuria likely due to recent tube exchange as well as infection. Holding DVT prophylaxis continue to monitor hemoglobin.  Intractable nausea and vomiting secondary to above, Improved with Zofran and Phenergan. Continue supportive treatment.  Chronic kidney stage IV/hypokalemia: With a baseline creatinine around 1.8-2.6.  Has significantly improved, this morning is 1.7 replete electrolytes.  Lymphedema/heart failure not in exacerbation: Continue current home dose of Lasix and potassium supplementation. Consult physical therapy.  Asthma: Continue albuterol.  DVT prophylaxis: scd Family  Communication:none Status is: Inpatient  Remains inpatient appropriate because: Acute sepsis due to her complicated UTI.        Code Status:     Code Status Orders  (From admission, onward)           Start     Ordered   11/08/20 2006  Do not attempt resuscitation (DNR)  Continuous       Question Answer Comment  In the event of cardiac or respiratory ARREST Do not call a "code blue"   In the event of cardiac or respiratory ARREST Do not perform Intubation, CPR, defibrillation or ACLS   In the event of cardiac or respiratory ARREST Use medication by any route, position, wound care, and other measures to relive pain and suffering. May use oxygen, suction and manual treatment of airway obstruction as needed for comfort.      11/08/20 2005           Code Status History     Date Active Date Inactive Code Status Order ID Comments User Context   11/08/2020 1909 11/08/2020 2005 Full Code [id number]  Marcelyn Bruins, MD ED   07/17/2020 0945 07/20/2020 1933 Full Code [id number]  Rise Patience, MD ED   04/08/2019 2347 04/10/2019 2125 Full Code [id number]  Vianne Bulls, MD ED   03/11/2019 2039 03/13/2019 1949 Full Code [id number]  Hayden Rasmussen, MD ED         IV Access:   Peripheral IV   Procedures and diagnostic studies:   No results found.   Medical Consultants:   None.   Subjective:    Shelia Chan relates her hematuria has resolved abdominal pain improved but continues to be nauseated.  Objective:    Vitals:   11/10/20 1357 11/10/20  2010 11/11/20 0432 11/11/20 0507  BP: 138/70 101/64 126/67   Pulse: 63 61 (!) 51   Resp: 20 15 17    Temp: 98.1 F (36.7 C) 98.8 F (37.1 C) 97.9 F (36.6 C)   TempSrc: Oral Oral Oral   SpO2: 99% 100% 97%   Weight:    56.1 kg  Height:       SpO2: 97 %   Intake/Output Summary (Last 24 hours) at 11/11/2020 0703 Last data filed at 11/11/2020 5573 Gross per 24 hour  Intake 1799.09 ml  Output 1500 ml   Net 299.09 ml   Filed Weights   11/09/20 0400 11/10/20 0457 11/11/20 0507  Weight: 58.7 kg 57.4 kg 56.1 kg    Exam: General exam: In no acute distress. Respiratory system: Good air movement and clear to auscultation. Cardiovascular system: S1 & S2 heard, RRR. No JVD. Gastrointestinal system: Positive bowel sounds left lower quadrant tenderness no rebound or guarding Extremities: No pedal edema. Skin: No rashes, lesions or ulcers Psychiatry: Judgement and insight appear normal. Mood & affect appropriate.    Data Reviewed:    Labs: Basic Metabolic Panel: Recent Labs  Lab 11/08/20 1223 11/08/20 1424 11/09/20 0438 11/10/20 0648 11/11/20 0445  NA 137  --  136 136 133*  K 3.3*  --  3.4* 3.2* 3.2*  CL 99  --  100 98 98  CO2 27  --  25 29 27   GLUCOSE 142*  --  119* 101* 102*  BUN 23  --  23 22 16   CREATININE 1.78*  --  1.69* 1.89* 1.76*  CALCIUM 9.4  --  8.5* 8.3* 8.1*  MG  --  2.2  --   --  2.1  PHOS  --   --   --   --  2.6   GFR Estimated Creatinine Clearance: 24.9 mL/min (A) (by C-G formula based on SCr of 1.76 mg/dL (H)). Liver Function Tests: Recent Labs  Lab 11/08/20 1223  AST 17  ALT 12  ALKPHOS 102  BILITOT 0.6  PROT 8.0  ALBUMIN 3.9   No results for input(s): LIPASE, AMYLASE in the last 168 hours. No results for input(s): AMMONIA in the last 168 hours. Coagulation profile No results for input(s): INR, PROTIME in the last 168 hours. COVID-19 Labs  No results for input(s): DDIMER, FERRITIN, LDH, CRP in the last 72 hours.  Lab Results  Component Value Date   SARSCOV2NAA NEGATIVE 07/17/2020   [place] NEGATIVE 04/08/2019   [place] NEGATIVE 03/11/2019    CBC: Recent Labs  Lab 11/08/20 1223 11/08/20 2322 11/09/20 0438 11/10/20 0648 11/11/20 0445  WBC 9.3 12.8* 11.2* 5.2 5.5  NEUTROABS 8.6*  --   --   --   --   HGB 12.3 11.0* 11.8* 10.0* 10.2*  HCT 37.8 33.8* 36.4 31.2* 32.1*  MCV 87.1 89.2 90.1 89.1 91.2  PLT 426* 321 354 322 313    Cardiac Enzymes: No results for input(s): CKTOTAL, CKMB, CKMBINDEX, TROPONINI in the last 168 hours. BNP (last 3 results) No results for input(s): PROBNP in the last 8760 hours. CBG: No results for input(s): GLUCAP in the last 168 hours. D-Dimer: No results for input(s): DDIMER in the last 72 hours. Hgb A1c: No results for input(s): HGBA1C in the last 72 hours. Lipid Profile: No results for input(s): CHOL, HDL, LDLCALC, TRIG, CHOLHDL, LDLDIRECT in the last 72 hours. Thyroid function studies: No results for input(s): TSH, T4TOTAL, T3FREE, THYROIDAB in the last 72 hours.  Invalid input(s): FREET3  Anemia work up: No results for input(s): VITAMINB12, FOLATE, FERRITIN, TIBC, IRON, RETICCTPCT in the last 72 hours. Sepsis Labs: Recent Labs  Lab 11/08/20 2322 11/09/20 0438 11/10/20 0648 11/11/20 0445  WBC 12.8* 11.2* 5.2 5.5   Microbiology Recent Results (from the past 240 hour(s))  Urine Culture     Status: Abnormal   Collection Time: 11/08/20  4:50 PM   Specimen: Urine, Clean Catch  Result Value Ref Range Status   Specimen Description   Final    URINE, CLEAN CATCH Performed at [facility] [address] [place], [place] [address]    Special Requests   Final    NONE Performed at [facility], [place] [address] [place], [place] [address]    Culture MULTIPLE SPECIES PRESENT, SUGGEST RECOLLECTION (A)  Final   Report Status 11/09/2020 FINAL  Final  Culture, blood (routine x 2)     Status: None (Preliminary result)   Collection Time: 11/08/20  6:11 PM   Specimen: BLOOD  Result Value Ref Range Status   Specimen Description   Final    BLOOD SITE NOT SPECIFIED Performed at [facility] [address] [place], [place] [address]    Special Requests   Final    BOTTLES DRAWN AEROBIC AND ANAEROBIC Blood Culture adequate volume Performed at [facility] [address], [place], [place] [address]     Culture   Final    NO GROWTH 2 DAYS Performed at [facility] [address], [place], [place] [address]    Report Status PENDING  Incomplete  Culture, blood (routine x 2)     Status: None (Preliminary result)   Collection Time: 11/08/20 11:22 PM   Specimen: BLOOD  Result Value Ref Range Status   Specimen Description   Final    BLOOD BLOOD LEFT HAND Performed at [facility] [address], [place], [place] [address]    Special Requests   Final    BOTTLES DRAWN AEROBIC ONLY Blood Culture adequate volume Performed at [facility] [address] [place], [place] [address]    Culture   Final    NO GROWTH 1 DAY Performed at [facility], [place] [address], [place],  [address]    Report Status PENDING  Incomplete     Medications:    acidophilus  1 capsule Oral Daily   ARIPiprazole  30 mg Oral Daily   vitamin C  500 mg Oral Daily   aspirin EC  81 mg Oral Daily   B-complex with vitamin C  1 tablet Oral Daily   cholecalciferol  2,000 Units Oral Daily   furosemide  40 mg Oral Daily   multivitamin with minerals   Oral Daily   potassium chloride SA  20 mEq Oral Daily   sodium chloride flush  3 mL Intravenous Q12H   Continuous Infusions:  lactated ringers 50 mL/hr at 11/11/20 0638   piperacillin-tazobactam (ZOSYN)  IV Stopped (11/11/20 0525)   promethazine (PHENERGAN) injection (IM or IVPB) Stopped (11/11/20 0117)      LOS: 2 days   Charlynne Cousins  [facility]  11/11/2020, 7:03 AM

## 2020-11-11 NOTE — Progress Notes (Signed)
Administered 2nd dose of potassium PO and the patient had an emesis occurrence 10 minutes later. Last K+ level results were at 0445, today (10/26) and it was 3.2.

## 2020-11-12 DIAGNOSIS — N184 Chronic kidney disease, stage 4 (severe): Secondary | ICD-10-CM | POA: Diagnosis not present

## 2020-11-12 DIAGNOSIS — R310 Gross hematuria: Secondary | ICD-10-CM | POA: Diagnosis not present

## 2020-11-12 DIAGNOSIS — T83512A Infection and inflammatory reaction due to nephrostomy catheter, initial encounter: Secondary | ICD-10-CM | POA: Diagnosis not present

## 2020-11-12 DIAGNOSIS — T83511A Infection and inflammatory reaction due to indwelling urethral catheter, initial encounter: Secondary | ICD-10-CM | POA: Diagnosis not present

## 2020-11-12 LAB — CBC
HCT: 33 % — ABNORMAL LOW (ref 36.0–46.0)
Hemoglobin: 10.4 g/dL — ABNORMAL LOW (ref 12.0–15.0)
MCH: 28.7 pg (ref 26.0–34.0)
MCHC: 31.5 g/dL (ref 30.0–36.0)
MCV: 90.9 fL (ref 80.0–100.0)
Platelets: 317 10*3/uL (ref 150–400)
RBC: 3.63 MIL/uL — ABNORMAL LOW (ref 3.87–5.11)
RDW: 14.6 % (ref 11.5–15.5)
WBC: 6.6 10*3/uL (ref 4.0–10.5)
nRBC: 0 % (ref 0.0–0.2)

## 2020-11-12 LAB — BASIC METABOLIC PANEL
Anion gap: 8 (ref 5–15)
BUN: 16 mg/dL (ref 8–23)
CO2: 26 mmol/L (ref 22–32)
Calcium: 8.7 mg/dL — ABNORMAL LOW (ref 8.9–10.3)
Chloride: 103 mmol/L (ref 98–111)
Creatinine, Ser: 1.81 mg/dL — ABNORMAL HIGH (ref 0.44–1.00)
GFR, Estimated: 30 mL/min — ABNORMAL LOW (ref >60)
Glucose, Bld: 95 mg/dL (ref 70–99)
Potassium: 3.8 mmol/L (ref 3.5–5.1)
Sodium: 137 mmol/L (ref 135–145)

## 2020-11-12 LAB — URINE CULTURE: Culture: 50000 — AB

## 2020-11-12 LAB — MAGNESIUM: Magnesium: 2.2 mg/dL (ref 1.7–2.4)

## 2020-11-12 MED ORDER — DULOXETINE HCL 30 MG PO CPEP
30.0000 mg | ORAL_CAPSULE | Freq: Every day | ORAL | Status: DC
  Administered 2020-11-12: 30 mg via ORAL
  Filled 2020-11-12: qty 1

## 2020-11-12 MED ORDER — CIPROFLOXACIN HCL 500 MG PO TABS
500.0000 mg | ORAL_TABLET | Freq: Every day | ORAL | Status: DC
  Filled 2020-11-12: qty 1

## 2020-11-12 MED ORDER — SODIUM CHLORIDE 0.9 % IV SOLN
12.5000 mg | Freq: Four times a day (QID) | INTRAVENOUS | Status: DC | PRN
  Filled 2020-11-12: qty 0.5

## 2020-11-12 MED ORDER — CIPROFLOXACIN IN D5W 400 MG/200ML IV SOLN
400.0000 mg | INTRAVENOUS | Status: DC
  Administered 2020-11-12: 400 mg via INTRAVENOUS
  Filled 2020-11-12: qty 200

## 2020-11-12 NOTE — Progress Notes (Signed)
[facility] PROGRESS NOTE    Progress Note  Tonni Mansour  JKD:[id number] DOB: [birth year] DOA: 11/08/2020 PCP: [name], MD     Brief Narrative:   Shelia Chan is an 65 y.o. 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  She is also had a recent history of ESBL sensitive to meropenem in July.    Assessment/Plan:   Sepsis secondary to complicated UTI present on admission/history of ESBL E. coli: She had a recent ureteral stent exchange on 11/06/2020. Dr. [place] Rile urology was curb sided who recommended empiric IV antibiotics for 14 days and urine output.,  If urine output decreases consult IR for evaluation and replacement of stent.  Otherwise the patient will follow-up with urology as previously scheduled as an outpatient. Urine output is improving, hematuria has resolved. Urine culture grew Pseudomonas sensitive to ciprofloxacin, she is able to tolerate her diet today can discontinue Zosyn and start her on oral ciprofloxacin. Physical therapy consult has been placed.  Hematuria: Urology was curb sided recommended IV antibiotics hematuria likely due to recent tube exchange as well as infection. Holding DVT prophylaxis continue to monitor hemoglobin.  Currently on SCDs for DVT prophylaxis.  Intractable nausea and vomiting secondary to above, Significantly improved, she relates she is 100% better excited to try diet today, she relates she is really hungry.  Chronic kidney stage IV/hypokalemia: With a baseline creatinine around 1.8-2.6.  Has significantly improved, this morning is 1.7 replete electrolytes.  Lymphedema/heart failure not in  exacerbation: Continue current home dose of Lasix and potassium supplementation. Consult physical therapy.  Asthma: Continue albuterol.  DVT prophylaxis: scd Family Communication:none Status is: Inpatient  Remains inpatient appropriate because: Acute sepsis due to her complicated UTI.        Code Status:     Code Status Orders  (From admission, onward)           Start     Ordered   11/08/20 2006  Do not attempt resuscitation (DNR)  Continuous       Question Answer Comment  In the event of cardiac or respiratory ARREST Do not call a "code blue"   In the event of cardiac or respiratory ARREST Do not perform Intubation, CPR, defibrillation or ACLS   In the event of cardiac or respiratory ARREST Use medication by any route, position, wound care, and other measures to relive pain and suffering. May use oxygen, suction and manual treatment of airway obstruction as needed for comfort.      11/08/20 2005           Code Status History     Date Active Date Inactive Code Status Order ID Comments User Context   11/08/2020 1909 11/08/2020 2005 Full Code [id number]  Marcelyn Bruins, MD ED   07/17/2020 0945 07/20/2020 1933 Full Code [id number]  Rise Patience, MD ED   04/08/2019 2347 04/10/2019 2125 Full Code [id number]  Vianne Bulls, MD ED   03/11/2019 2039 03/13/2019 1949 Full Code [id number]  Hayden Rasmussen, MD ED         IV Access:   Peripheral IV   Procedures and diagnostic studies:   No results found.   Medical Consultants:   None.   Subjective:  Shelia Chan relates her abdominal pain is significantly better as an appetite this morning excited to eat.  Objective:    Vitals:   11/11/20 2100 11/12/20 0429 11/12/20 0434 11/12/20 0735  BP: 99/63 (!) 98/59  108/69  Pulse: 66 62  60  Resp:  18  19  Temp:  99.2 F (37.3 C)  98.7 F (37.1 C)  TempSrc:  Oral  Oral  SpO2: 100% 97%  97%  Weight:   56.4 kg   Height:       SpO2: 97  %   Intake/Output Summary (Last 24 hours) at 11/12/2020 0751 Last data filed at 11/12/2020 0439 Gross per 24 hour  Intake 1035.99 ml  Output 2000 ml  Net -964.01 ml    Filed Weights   11/10/20 0457 11/11/20 0507 11/12/20 0434  Weight: 57.4 kg 56.1 kg 56.4 kg    Exam: General exam: In no acute distress. Respiratory system: Good air movement and clear to auscultation. Cardiovascular system: S1 & S2 heard, RRR. No JVD. Gastrointestinal system: Abdomen is nondistended, soft and nontender.  Extremities: No pedal edema. Skin: No rashes, lesions or ulcers Psychiatry: Judgement and insight appear normal. Mood & affect appropriate.   Data Reviewed:    Labs: Basic Metabolic Panel: Recent Labs  Lab 11/08/20 1223 11/08/20 1424 11/09/20 0438 11/10/20 0648 11/11/20 0445 11/12/20 0447  NA 137  --  136 136 133* 137  K 3.3*  --  3.4* 3.2* 3.2* 3.8  CL 99  --  100 98 98 103  CO2 27  --  25 29 27 26   GLUCOSE 142*  --  119* 101* 102* 95  BUN 23  --  23 22 16 16   CREATININE 1.78*  --  1.69* 1.89* 1.76* 1.81*  CALCIUM 9.4  --  8.5* 8.3* 8.1* 8.7*  MG  --  2.2  --   --  2.1  --   PHOS  --   --   --   --  2.6  --     GFR Estimated Creatinine Clearance: 24.2 mL/min (A) (by C-G formula based on SCr of 1.81 mg/dL (H)). Liver Function Tests: Recent Labs  Lab 11/08/20 1223  AST 17  ALT 12  ALKPHOS 102  BILITOT 0.6  PROT 8.0  ALBUMIN 3.9    No results for input(s): LIPASE, AMYLASE in the last 168 hours. No results for input(s): AMMONIA in the last 168 hours. Coagulation profile No results for input(s): INR, PROTIME in the last 168 hours. COVID-19 Labs  No results for input(s): DDIMER, FERRITIN, LDH, CRP in the last 72 hours.  Lab Results  Component Value Date   SARSCOV2NAA NEGATIVE 07/17/2020   [place] NEGATIVE 04/08/2019   [place] NEGATIVE 03/11/2019    CBC: Recent Labs  Lab 11/08/20 1223 11/08/20 2322 11/09/20 0438 11/10/20 0648 11/11/20 0445  11/12/20 0447  WBC 9.3 12.8* 11.2* 5.2 5.5 6.6  NEUTROABS 8.6*  --   --   --   --   --   HGB 12.3 11.0* 11.8* 10.0* 10.2* 10.4*  HCT 37.8 33.8* 36.4 31.2* 32.1* 33.0*  MCV 87.1 89.2 90.1 89.1 91.2 90.9  PLT 426* 321 354 322 313 317    Cardiac Enzymes: No results for input(s): CKTOTAL, CKMB, CKMBINDEX, TROPONINI in the last 168 hours. BNP (last 3 results) No results for input(s): PROBNP in the last 8760 hours. CBG: No results for input(s): GLUCAP in the last 168 hours. D-Dimer: No results for input(s): DDIMER in the last 72  hours. Hgb A1c: No results for input(s): HGBA1C in the last 72 hours. Lipid Profile: No results for input(s): CHOL, HDL, LDLCALC, TRIG, CHOLHDL, LDLDIRECT in the last 72 hours. Thyroid function studies: No results for input(s): TSH, T4TOTAL, T3FREE, THYROIDAB in the last 72 hours.  Invalid input(s): FREET3 Anemia work up: No results for input(s): VITAMINB12, FOLATE, FERRITIN, TIBC, IRON, RETICCTPCT in the last 72 hours. Sepsis Labs: Recent Labs  Lab 11/09/20 0438 11/10/20 0648 11/11/20 0445 11/12/20 0447  WBC 11.2* 5.2 5.5 6.6    Microbiology Recent Results (from the past 240 hour(s))  Urine Culture     Status: Abnormal   Collection Time: 11/08/20  4:50 PM   Specimen: Urine, Clean Catch  Result Value Ref Range Status   Specimen Description   Final    URINE, CLEAN CATCH Performed at [facility] [address], [place], [place] [address]    Special Requests   Final    NONE Performed at [facility], [place] [address] [place], [place] [address]    Culture MULTIPLE SPECIES PRESENT, SUGGEST RECOLLECTION (A)  Final   Report Status 11/09/2020 FINAL  Final  Culture, blood (routine x 2)     Status: None (Preliminary result)   Collection Time: 11/08/20  6:11 PM   Specimen: BLOOD  Result Value Ref Range Status   Specimen Description   Final    BLOOD SITE NOT SPECIFIED Performed at [facility] [address], [place], [place] [address]    Special Requests   Final    BOTTLES DRAWN AEROBIC AND ANAEROBIC Blood Culture adequate volume Performed at [facility] [address], [place], [place] [address]    Culture   Final    NO GROWTH 3 DAYS Performed at [facility], [place] [address], [place], [place] [address]    Report Status PENDING  Incomplete  Culture, blood (routine x 2)     Status: None (Preliminary result)   Collection Time: 11/08/20 11:22 PM   Specimen: BLOOD  Result Value Ref Range Status   Specimen Description   Final    BLOOD BLOOD LEFT HAND Performed at [facility] [address] [place], [place] [address]    Special Requests   Final    BOTTLES DRAWN AEROBIC ONLY Blood Culture adequate volume Performed at [facility] [address] [place], [place] [address]    Culture   Final    NO GROWTH 2 DAYS Performed at [facility] [address], [place], [place] [address]    Report Status PENDING  Incomplete  Urine Culture     Status: Abnormal   Collection Time: 11/10/20  2:07 PM   Specimen: Urine, Catheterized  Result Value Ref Range Status   Specimen Description   Final    URINE, CATHETERIZED Performed at [facility] [address], [place], [place] [address]    Special Requests   Final    NONE Performed at [facility], [place] [address], [place], [place] [address]    Culture 50,000 COLONIES/mL PSEUDOMONAS AERUGINOSA (A)  Final   Report Status 11/12/2020 FINAL  Final   Organism ID, Bacteria PSEUDOMONAS AERUGINOSA (A)  Final      Susceptibility   Pseudomonas aeruginosa - MIC*    CEFTAZIDIME 4 SENSITIVE Sensitive     CIPROFLOXACIN <=0.25 SENSITIVE Sensitive     GENTAMICIN <=1 SENSITIVE Sensitive     IMIPENEM 1  SENSITIVE Sensitive     PIP/TAZO <=4 SENSITIVE Sensitive     CEFEPIME 2 SENSITIVE Sensitive     * 50,000 COLONIES/mL PSEUDOMONAS  AERUGINOSA     Medications:    acidophilus  1 capsule Oral Daily   ARIPiprazole  30 mg Oral Daily   vitamin C  500 mg Oral Daily   aspirin EC  81 mg Oral Daily   B-complex with vitamin C  1 tablet Oral Daily   cholecalciferol  2,000 Units Oral Daily   furosemide  40 mg Oral Daily   multivitamin with minerals   Oral Daily   potassium chloride SA  20 mEq Oral Daily   sodium chloride flush  3 mL Intravenous Q12H   Continuous Infusions:  sodium chloride 10 mL/hr at 11/11/20 0949   lactated ringers 50 mL/hr at 11/11/20 0638   piperacillin-tazobactam (ZOSYN)  IV 3.375 g (11/12/20 0235)   promethazine (PHENERGAN) injection (IM or IVPB) 6.25 mg (11/11/20 1432)      LOS: 3 days   Charlynne Cousins  [facility]  11/12/2020, 7:51 AM

## 2020-11-12 NOTE — Evaluation (Signed)
Physical Therapy Evaluation Patient Details Name: Shelia Chan MRN: [id number] DOB: Jun 12, 1955 Today's Date: 11/12/2020  History of Present Illness  65 yo female presents to ED on 10/23 with decreased intake, fatigue, hematuria after stent exchange 10/21, abdominal pain. Workup for sepsis secondary to UTI. PMH includes CKD 4, SBO, hydronephrosis, asthma, CHF, lymphedema, COPD; urethral cancer s/p cystectomy, ureterectomy, ileal conduit creation, ureteral stenting exchanges every 6 weeks, now nephroreteral catheter placement with multiple exchanges.  Clinical Impression   Pt presents with generalized weakness, abdominal discomfort secondary to nausea, fair to poor standing balance, and decreased activity tolerance vs baseline. Pt to benefit from acute PT to address deficits. Pt ambulated hallway distance with RW and close guard for safety, pt limited in tolerance by LE fatigue. PT to progress mobility as tolerated, and will continue to follow acutely.         Recommendations for follow up therapy are one component of a multi-disciplinary discharge planning process, led by the attending physician.  Recommendations may be updated based on patient status, additional functional criteria and insurance authorization.  Follow Up Recommendations Home health PT    Assistance Recommended at Discharge Intermittent Supervision/Assistance  Functional Status Assessment Patient has had a recent decline in their functional status and demonstrates the ability to make significant improvements in function in a reasonable and predictable amount of time.  Equipment Recommendations  None recommended by PT    Recommendations for Other Services       Precautions / Restrictions Precautions Precautions: Fall (moderate; per RN pt ambulates to bathroom by herself) Restrictions Weight Bearing Restrictions: No      Mobility  Bed Mobility Overal bed mobility: Needs Assistance Bed Mobility: Supine to Sit;Sit to  Supine     Supine to sit: Supervision Sit to supine: Supervision        Transfers Overall transfer level: Needs assistance Equipment used: Rolling walker (2 wheels) Transfers: Sit to/from Stand Sit to Stand: Supervision           General transfer comment: for safety, STS x2 from EOB and toilet.    Ambulation/Gait Ambulation/Gait assistance: Min guard Gait Distance (Feet): 110 Feet Assistive device: Rolling walker (2 wheels) Gait Pattern/deviations: Step-through pattern;Decreased stride length;Trunk flexed Gait velocity: decr   General Gait Details: close guard for safety, pt with forward flexed trunk due to abdominal discomfort.  Stairs            Wheelchair Mobility    Modified Rankin (Stroke Patients Only)       Balance Overall balance assessment: Needs assistance Sitting-balance support: No upper extremity supported Sitting balance-Leahy Scale: Good     Standing balance support: During functional activity Standing balance-Leahy Scale: Fair Standing balance comment: can stand without AD, benefits from RW for balance                             Pertinent Vitals/Pain Pain Assessment: Faces Faces Pain Scale: Hurts a little bit Pain Location: abdomen (nausea) Pain Descriptors / Indicators: Discomfort;Nagging Pain Intervention(s): Limited activity within patient's tolerance;Monitored during session;Repositioned    Home Living Family/patient expects to be discharged to:: Private residence Living Arrangements: Children;Other (Comment) (grandchildren) Available Help at Discharge: Family Type of Home: House Home Access: Level entry (if she enters from basement; has 23 steps to get to top floor but does not have to do it)     Alternate Level Stairs-Number of Steps: does not have to perform steps unless she wants  to go upstairs to ex son-in-law's Home Layout: Multi-level Home Equipment: Conservation officer, nature (2 wheels);BSC;Shower seat      Prior  Function Prior Level of Function : Independent/Modified Independent             Mobility Comments: pt reports no AD use PTA ADLs Comments: Pt reports independence with ADLs     Hand Dominance   Dominant Hand: Right    Extremity/Trunk Assessment   Upper Extremity Assessment Upper Extremity Assessment: Defer to OT evaluation    Lower Extremity Assessment Lower Extremity Assessment: Generalized weakness    Cervical / Trunk Assessment Cervical / Trunk Assessment: Normal  Communication   Communication: No difficulties  Cognition Arousal/Alertness: Awake/alert Behavior During Therapy: WFL for tasks assessed/performed Overall Cognitive Status: Within Functional Limits for tasks assessed                                 General Comments: pt emotional at times during session, 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 Pt follows commands well        General Comments      Exercises     Assessment/Plan    PT Assessment Patient needs continued PT services  PT Problem List Decreased strength;Decreased mobility;Decreased safety awareness;Decreased balance;Decreased knowledge of use of DME;Pain;Decreased activity tolerance       PT Treatment Interventions DME instruction;Therapeutic activities;Gait training;Therapeutic exercise;Patient/family education;Balance training;Functional mobility training;Neuromuscular re-education    PT Goals (Current goals can be found in the Care Plan section)  Acute Rehab PT Goals Patient Stated Goal: home PT Goal Formulation: With patient Time For Goal Achievement: 11/26/20 Potential to Achieve Goals: Good    Frequency Min 3X/week   Barriers to discharge        Co-evaluation               AM-PAC PT "6 Clicks" Mobility  Outcome Measure Help needed turning from your back to your side while in a flat bed without using bedrails?: A Little Help needed  moving from lying on your back to sitting on the side of a flat bed without using bedrails?: A Little Help needed moving to and from a bed to a chair (including a wheelchair)?: A Little Help needed standing up from a chair using your arms (e.g., wheelchair or bedside chair)?: A Little Help needed to walk in hospital room?: A Little Help needed climbing 3-5 steps with a railing? : A Little 6 Click Score: 18    End of Session   Activity Tolerance: Patient tolerated treatment well Patient left: in bed;with call bell/phone within reach;with nursing/sitter in room Nurse Communication: Mobility status PT Visit Diagnosis: Other abnormalities of gait and mobility (R26.89);Muscle weakness (generalized) (M62.81)    Time: 1534-1600 PT Time Calculation (min) (ACUTE ONLY): 26 min   Charges:   PT Evaluation $PT Eval Low Complexity: 1 Low PT Treatments $Gait Training: 8-22 mins       Stacie Glaze, PT DPT Acute Rehabilitation Services Pager [phone]  Office [phone]   [place] 11/12/2020, 4:25 PM

## 2020-11-13 DIAGNOSIS — J45901 Unspecified asthma with (acute) exacerbation: Secondary | ICD-10-CM | POA: Diagnosis not present

## 2020-11-13 DIAGNOSIS — N390 Urinary tract infection, site not specified: Secondary | ICD-10-CM | POA: Diagnosis not present

## 2020-11-13 DIAGNOSIS — N184 Chronic kidney disease, stage 4 (severe): Secondary | ICD-10-CM | POA: Diagnosis not present

## 2020-11-13 DIAGNOSIS — T83512A Infection and inflammatory reaction due to nephrostomy catheter, initial encounter: Secondary | ICD-10-CM | POA: Diagnosis not present

## 2020-11-13 LAB — CULTURE, BLOOD (ROUTINE X 2)
Culture: NO GROWTH
Special Requests: ADEQUATE

## 2020-11-13 LAB — CBC
HCT: 33.2 % — ABNORMAL LOW (ref 36.0–46.0)
Hemoglobin: 10.7 g/dL — ABNORMAL LOW (ref 12.0–15.0)
MCH: 29.1 pg (ref 26.0–34.0)
MCHC: 32.2 g/dL (ref 30.0–36.0)
MCV: 90.2 fL (ref 80.0–100.0)
Platelets: 366 10*3/uL (ref 150–400)
RBC: 3.68 MIL/uL — ABNORMAL LOW (ref 3.87–5.11)
RDW: 14.6 % (ref 11.5–15.5)
WBC: 7.1 10*3/uL (ref 4.0–10.5)
nRBC: 0 % (ref 0.0–0.2)

## 2020-11-13 LAB — BASIC METABOLIC PANEL
Anion gap: 6 (ref 5–15)
BUN: 13 mg/dL (ref 8–23)
CO2: 28 mmol/L (ref 22–32)
Calcium: 8.8 mg/dL — ABNORMAL LOW (ref 8.9–10.3)
Chloride: 101 mmol/L (ref 98–111)
Creatinine, Ser: 1.74 mg/dL — ABNORMAL HIGH (ref 0.44–1.00)
GFR, Estimated: 32 mL/min — ABNORMAL LOW (ref >60)
Glucose, Bld: 102 mg/dL — ABNORMAL HIGH (ref 70–99)
Potassium: 3.4 mmol/L — ABNORMAL LOW (ref 3.5–5.1)
Sodium: 135 mmol/L (ref 135–145)

## 2020-11-13 MED ORDER — CIPROFLOXACIN HCL 500 MG PO TABS: 500.0000 mg | ORAL_TABLET | Freq: Every day | ORAL | 0 refills | Status: DC

## 2020-11-13 MED ORDER — CIPROFLOXACIN HCL 500 MG PO TABS
500.0000 mg | ORAL_TABLET | Freq: Once | ORAL | Status: AC
  Administered 2020-11-13: 500 mg via ORAL
  Filled 2020-11-13: qty 1

## 2020-11-13 MED ORDER — CIPROFLOXACIN HCL 500 MG PO TABS: 500.0000 mg | ORAL_TABLET | Freq: Every day | ORAL | Status: DC

## 2020-11-13 MED ORDER — CIPROFLOXACIN HCL 500 MG PO TABS: 500.0000 mg | ORAL_TABLET | Freq: Every day | ORAL | 0 refills | Status: AC

## 2020-11-13 NOTE — Care Management Important Message (Signed)
Medicare IM printed for W/L Social Work to give to the patient. 

## 2020-11-13 NOTE — Discharge Summary (Signed)
Physician Discharge Summary  Shelia Chan FKC:[id number] DOB: 09-10-1955 DOA: 11/08/2020  PCP: [name], MD  Admit date: 11/08/2020 Discharge date: 11/13/2020  Admitted From: Home Disposition: Home  Recommendations for Outpatient Follow-up:  Follow up with PCP in 1 week Please follow up on the following pending results: None  Home Health: None Equipment/Devices: None  Discharge Condition: Stable CODE STATUS: DNR Diet recommendation: Regular diet/heart healthy  Brief/Interim Summary:  Admission HPI written by Little Ishikawa, MD   HPI: Shelia Chan is a 65 y.o. female with medical history significant of CKD 4, SBO, hydronephrosis, asthma, CHF, lymphedema, COPD, urethral cancer presenting with hematuria, nausea, abdominal pain.   Patient has significant history of urethral cancer status post cystectomy, ureterectomy, and ileal conduit creation, status post ureteral stenting and now nephroureteral catheter placement.  She has had multiple nephroureteral catheter exchanges and her last one was 2 days ago. Also of note she was admitted in July with UTI found to be ESBL sensitive to meropenem and Zosyn.   For the past couple of days he reports he has had abdominal pain with nausea and vomiting.  She states that she began to notice significant hematuria starting yesterday from her nephrostomy tubes.  The pain she reports is dull with some episodes of sharpness and is located near her urostomy site.  Pain was initially intermittent and now become constant and worsening.  She also reports decreased p.o. intake for the last couple days.  She additionally has reported some chest pain with mild shortness of breath that comes and goes for the last day.   During transport via EMS fever to 101.2 was noted. She denies fevers, chills. Constipation/diarrhea.   Hospital course:  Sepsis  Documented. Does not appear to have been present on admission. Secondary to UTI antibiotics  as mentioned below. Blood cultures with no growth.  Complicated UTI Patient started empirically on Zosyn IV for 4 days. Urine culture significant for pseudomonas aeruginosa. Patient transitioned to Ciprofloxacin with recommendations from Urology for a 14 day course.  Hematuria Likely secondary to UTI. Hemoglobin stable.  Intractable nausea/vomiting Patient managed symptomatically with Phenergan prn. Likely related to infection. Resolved.  CKD stage IV Stable.  Hypokalemia Repleted as needed.  Lymphedema Stable. Continue Lasix and potassium.   Chronic diastolic heart failure Stable. Continue Lasix and potassium.  Asthma Continue albuterol.    Discharge Diagnoses:  Principal Problem:   UTI (urinary tract infection) Active Problems:   CKD (chronic kidney disease), stage IV (HCC)   Hematuria   Asthma, chronic, unspecified asthma severity, with acute exacerbation   Edema   UTI (urinary tract infection) due to urinary indwelling catheter [facility])    Discharge Instructions  Discharge Instructions     Call MD for:  persistant nausea and vomiting   Complete by: As directed    Call MD for:  temperature >100.4   Complete by: As directed       Allergies as of 11/13/2020       Reactions   Metoprolol Shortness Of Breath        Medication List     TAKE these medications    albuterol 108 (90 Base) MCG/ACT inhaler Commonly known as: VENTOLIN HFA Inhale 1 puff into the lungs every 6 (six) hours as needed for shortness of breath.   ARIPiprazole 30 MG tablet Commonly known as: ABILIFY Take 30 mg by mouth daily.   aspirin EC 81 MG tablet Take 81 mg by mouth daily.  B-complex with vitamin C tablet Take 1 tablet by mouth daily.   ciprofloxacin 500 MG tablet Commonly known as: Cipro Take 1 tablet (500 mg total) by mouth daily with breakfast for 5 days. Start taking on: November 14, 2020   DULoxetine 30 MG capsule Commonly known as: CYMBALTA Take 30 mg by  mouth at bedtime.   furosemide 40 MG tablet Commonly known as: LASIX Take 1 tablet (40 mg total) by mouth daily.   Klor-Con M20 20 MEQ tablet Generic drug: potassium chloride SA TAKE 1 TABLET BY MOUTH EVERY DAY What changed: how much to take   MULTIVITAMIN ADULT PO Take 1 tablet by mouth daily.   Probiotic 1-250 BILLION-MG Caps Take 1 capsule by mouth daily.   vitamin C 500 MG tablet Commonly known as: ASCORBIC ACID Take 500 mg by mouth daily.   Vitamin D-3 25 MCG (1000 UT) Caps Take 2,000 Units by mouth daily.        Allergies  Allergen Reactions   Metoprolol Shortness Of Breath    Consultations: None   Procedures/Studies: CT Abdomen Pelvis Wo Contrast  Result Date: 11/08/2020 CLINICAL DATA:  Hematuria, unknown cause EXAM: CT ABDOMEN AND PELVIS WITHOUT CONTRAST TECHNIQUE: Multidetector CT imaging of the abdomen and pelvis was performed following the standard protocol without IV contrast. COMPARISON:  July 17, 2020. FINDINGS: Evaluation is limited by lack of IV contrast. Lower chest: No acute abnormality. Hepatobiliary: Unremarkable noncontrast appearance the liver and gallbladder. Pancreas: No peripancreatic fat stranding. Spleen: Unremarkable. Adrenals/Urinary Tract: Patient is status post cystectomy with diverting urostomy. RIGHT-sided nephroureteral stent is located within the pelvis with tubing coursing along the expected course of the ureter and external to patient. There is low positioning of the LEFT-sided nephroureteral stent along the course of the proximal to mid LEFT ureter with partial kinking of the inferior aspect of the pigtail. Positioning is similar in comparison to prior with increased interval kinking. Resolution of RIGHT-sided hydronephrosis. Unchanged mild prominence of the LEFT renal pelvis. Atrophic appearance of the LEFT kidney. There is a small amount of high density within the superior pole of the RIGHT kidney collecting system (series 5, image 70).  Hemorrhagic or proteinaceous cysts of the superior pole of the LEFT kidney. Unchanged appearance of the adrenal glands. Stomach/Bowel: No definite evidence of bowel obstruction. RIGHT lower quadrant ileal conduit. There are several fecalized loops of small bowel without frank dilation within the pelvis which could reflect slow transit. Transition point is near the surgical anastomoses. Vascular/Lymphatic: Multiple surgical clips along the pelvic sidewalls. Evaluation for lymphadenopathy is limited due to lack of IV contrast and lack of intra-abdominal fat. No definitive new lymphadenopathy identified. Reproductive: Status post hysterectomy. Other: No free air. Musculoskeletal: Status post pinning of the LEFT femur. IMPRESSION: 1. No hydronephrosis bilaterally. There is persistent positioning of the LEFT nephroureteral stent in the proximal ureter. There is mildly increased kinking along its course in comparison to prior. 2. There is subtle high density material in the superior pole of the RIGHT kidney. This could reflect blood products versus medullary calcifications. 3. There are several fecalized loops of bowel without frank dilation within the pelvis near the anastomosis which could reflect slow transit versus early bowel obstruction. Aortic Atherosclerosis (ICD10-I70.0). Electronically Signed   By: Valentino Saxon M.D.   On: 11/08/2020 14:53   IR EXT NEPHROURETERAL CATH EXCHANGE  Result Date: 11/06/2020 INDICATION: History of cystectomy for bladder cancer with chronic indwelling bilateral retrograde nephroureteral catheters via ileostomy. Patient presents today for routine  fluoroscopic guided exchange. EXAM: FLUOROSCOPIC GUIDED BILATERAL NEPHROURETERAL CATHETER EXCHANGE COMPARISON:  Multiple previous fluoroscopic guided exchanges, most recently on 09/25/2020 CONTRAST:  A total of 30 mL Omnipaque 300 was administered into both renal collecting systems FLUOROSCOPY TIME:  2 minutes, 6 seconds (6 mGy)  COMPLICATIONS: None immediate. TECHNIQUE: Informed written consent was obtained from the patient after a discussion of the risks, benefits and alternatives to treatment. Questions regarding the procedure were encouraged and answered. A timeout was performed prior to the initiation of the procedure. External portion of the existing retrograde nephroureteral catheter as well as the surrounding skin was draped in the usual sterile fashion. A sterile drape was applied covering the operative field. Maximum barrier sterile technique with sterile gowns and gloves were used for the procedure. A timeout was performed prior to the initiation of the procedure. A pre procedural spot fluoroscopic image was obtained after contrast was injected via the existing nephroureteral catheters demonstrating appropriate positioning within the bilateral renal pelves Next, the external portion of both nephroureteral catheters was cut and cannulated with a short Amplatz wires which were coiled within the bilateral renal pelvis ease Next, under intermittent fluoroscopic guided exchange, both nephroureteral catheters were removed simultaneously fall by simultaneous fluoroscopic guided replacement of bilateral 12 French, 45 cm all-purpose drainage catheters. Note, both nephroureteral catheters were exchanged simultaneously as previous exchanges have been uncomfortable for the patient and resulted inadvertent mobilization of the contralateral catheter due to the proximity and abrupt angulation of the ureteral anastomoses. Contrast was injected demonstrating appropriate positioning and functionality of bilateral nephroureteral catheters. The patient tolerated the procedure well without immediate postprocedural complication. FINDINGS: The existing nephroureteral catheter is appropriately positioned and functioning. After successful fluoroscopic guided exchange, the new nephroureteral catheters are appropriately positioned with end coiled and locked  within the bilateral renal pelves IMPRESSION: Successful fluoroscopic guided exchange of bilateral 12 French, 45 cm nephroureteral catheters. Note, as above, both nephroureteral catheters were exchanged simultaneously secondary to patient discomfort and the proximity and abrupt angulation of the ureteral anastomoses. Electronically Signed   By: Sandi Mariscal M.D.   On: 11/06/2020 16:04     Subjective: No concerns today.  Discharge Exam: Vitals:   11/12/20 2104 11/13/20 0550  BP: 105/68 116/71  Pulse: (!) 58 (!) 57  Resp: 17 12  Temp: 98.8 F (37.1 C) 98.6 F (37 C)  SpO2: 100% 98%   Vitals:   11/12/20 0735 11/12/20 1241 11/12/20 2104 11/13/20 0550  BP: 108/69 135/63 105/68 116/71  Pulse: 60 60 (!) 58 (!) 57  Resp: 19 16 17 12   Temp: 98.7 F (37.1 C) 98.3 F (36.8 C) 98.8 F (37.1 C) 98.6 F (37 C)  TempSrc: Oral  Oral Oral  SpO2: 97% 98% 100% 98%  Weight:      Height:        General exam: Appears calm and comfortable Respiratory system: Clear to auscultation. Respiratory effort normal. Cardiovascular system: S1 & S2 heard, RRR. No murmurs, rubs, gallops or clicks. Gastrointestinal system: Abdomen is nondistended, soft and nontender. No organomegaly or masses felt. Normal bowel sounds heard. Central nervous system: Alert and oriented. No focal neurological deficits. Musculoskeletal: No edema. No calf tenderness Skin: No cyanosis. No rashes Psychiatry: Judgement and insight appear normal. Mood & affect appropriate.     The results of significant diagnostics from this hospitalization (including imaging, microbiology, ancillary and laboratory) are listed below for reference.     Microbiology: Recent Results (from the past 240 hour(s))  Urine Culture  Status: Abnormal   Collection Time: 11/08/20  4:50 PM   Specimen: Urine, Clean Catch  Result Value Ref Range Status   Specimen Description   Final    URINE, CLEAN CATCH Performed at [facility], [place] [address], [place], [place] [address]    Special Requests   Final    NONE Performed at [facility], [place] [address], [place], [place] [address]    Culture MULTIPLE SPECIES PRESENT, SUGGEST RECOLLECTION (A)  Final   Report Status 11/09/2020 FINAL  Final  Culture, blood (routine x 2)     Status: None (Preliminary result)   Collection Time: 11/08/20  6:11 PM   Specimen: BLOOD  Result Value Ref Range Status   Specimen Description   Final    BLOOD SITE NOT SPECIFIED Performed at [place] [address] [place], [place] [address]    Special Requests   Final    BOTTLES DRAWN AEROBIC AND ANAEROBIC Blood Culture adequate volume Performed at [place] [address] [place], [place] [address]    Culture   Final    NO GROWTH 4 DAYS Performed at [facility], [place] [address], [place], [place] [address]    Report Status PENDING  Incomplete  Culture, blood (routine x 2)     Status: None (Preliminary result)   Collection Time: 11/08/20 11:22 PM   Specimen: BLOOD  Result Value Ref Range Status   Specimen Description   Final    BLOOD BLOOD LEFT HAND Performed at [place] [address], [place], [place] [address]    Special Requests   Final    BOTTLES DRAWN AEROBIC ONLY Blood Culture adequate volume Performed at [place] [address] [place], [place] [address]    Culture   Final    NO GROWTH 3 DAYS Performed at [facility], [place] [address], [place], [place] [address]    Report Status PENDING  Incomplete  Urine Culture     Status: Abnormal   Collection Time: 11/10/20  2:07 PM   Specimen: Urine, Catheterized  Result Value Ref Range Status   Specimen Description   Final    URINE, CATHETERIZED Performed at [place] [address] [place], [place] [address]    Special Requests   Final    NONE Performed at [facility], [place] [address], [place], [place] [address]    Culture 50,000 COLONIES/mL PSEUDOMONAS AERUGINOSA (A)  Final   Report Status 11/12/2020 FINAL  Final   Organism ID, Bacteria PSEUDOMONAS AERUGINOSA (A)  Final      Susceptibility   Pseudomonas aeruginosa - MIC*    CEFTAZIDIME 4 SENSITIVE Sensitive     CIPROFLOXACIN <=0.25 SENSITIVE Sensitive     GENTAMICIN <=1 SENSITIVE Sensitive     IMIPENEM 1 SENSITIVE Sensitive     PIP/TAZO <=4 SENSITIVE Sensitive     CEFEPIME 2 SENSITIVE Sensitive     * 50,000 COLONIES/mL PSEUDOMONAS AERUGINOSA     Labs: BNP (last 3 results) No results for input(s): BNP in the last 8760 hours. Basic Metabolic Panel: Recent Labs  Lab 11/08/20 1424 11/09/20 0438 11/10/20 0648 11/11/20 0445 11/12/20 0447 11/12/20 1323 11/13/20 0425  NA  --  136 136 133* 137  --  135  K  --  3.4* 3.2* 3.2* 3.8  --  3.4*  CL  --  100 98 98 103  --  101  CO2  --  25 29 27 26   --  28  GLUCOSE  --  119* 101* 102* 95  --  102*  BUN  --  23 22 16 16   --  13  CREATININE  --  1.69* 1.89* 1.76* 1.81*  --  1.74*  CALCIUM  --  8.5* 8.3* 8.1* 8.7*  --  8.8*  MG 2.2  --   --  2.1  --  2.2  --   PHOS  --   --   --  2.6  --   --   --    Liver Function Tests: Recent Labs  Lab 11/08/20 1223  AST 17  ALT 12  ALKPHOS 102  BILITOT 0.6  PROT 8.0  ALBUMIN 3.9   No results for input(s): LIPASE, AMYLASE in the last 168 hours. No results for input(s): AMMONIA in the last 168 hours. CBC: Recent Labs  Lab 11/08/20 1223 11/08/20 2322 11/09/20 0438 11/10/20 0648 11/11/20 0445 11/12/20 0447 11/13/20 0425  WBC 9.3   < > 11.2* 5.2 5.5 6.6 7.1  NEUTROABS 8.6*  --   --   --   --   --   --   HGB 12.3   < > 11.8* 10.0* 10.2* 10.4* 10.7*  HCT 37.8   < > 36.4 31.2* 32.1* 33.0* 33.2*  MCV 87.1   < > 90.1 89.1 91.2 90.9 90.2  PLT 426*   < > 354 322 313 317 366   < > = values in this interval not displayed.   Cardiac Enzymes: No results for input(s): CKTOTAL, CKMB, CKMBINDEX,  TROPONINI in the last 168 hours. BNP: Invalid input(s): POCBNP CBG: No results for input(s): GLUCAP in the last 168 hours. D-Dimer No results for input(s): DDIMER in the last 72 hours. Hgb A1c No results for input(s): HGBA1C in the last 72 hours. Lipid Profile No results for input(s): CHOL, HDL, LDLCALC, TRIG, CHOLHDL, LDLDIRECT in the last 72 hours. Thyroid function studies No results for input(s): TSH, T4TOTAL, T3FREE, THYROIDAB in the last 72 hours.  Invalid input(s): FREET3 Anemia work up No results for input(s): VITAMINB12, FOLATE, FERRITIN, TIBC, IRON, RETICCTPCT in the last 72 hours. Urinalysis    Component Value Date/Time   COLORURINE YELLOW 11/08/2020 1650   APPEARANCEUR CLOUDY (A) 11/08/2020 1650   LABSPEC 1.011 11/08/2020 1650   PHURINE 6.0 11/08/2020 1650   GLUCOSEU NEGATIVE 11/08/2020 1650   HGBUR MODERATE (A) 11/08/2020 1650   BILIRUBINUR NEGATIVE 11/08/2020 1650   KETONESUR NEGATIVE 11/08/2020 1650   PROTEINUR 100 (A) 11/08/2020 1650   NITRITE NEGATIVE 11/08/2020 1650   LEUKOCYTESUR MODERATE (A) 11/08/2020 1650   Sepsis Labs Invalid input(s): PROCALCITONIN,  WBC,  LACTICIDVEN Microbiology Recent Results (from the past 240 hour(s))  Urine Culture     Status: Abnormal   Collection Time: 11/08/20  4:50 PM   Specimen: Urine, Clean Catch  Result Value Ref Range Status   Specimen Description   Final    URINE, CLEAN CATCH Performed at [facility], [place] [address] [place], [place] [address]    Special Requests   Final    NONE Performed at [facility], [place] [address] [place], [place] [address]    Culture MULTIPLE SPECIES PRESENT, SUGGEST RECOLLECTION (A)  Final   Report Status 11/09/2020 FINAL  Final  Culture, blood (routine x 2)     Status: None (Preliminary result)   Collection Time: 11/08/20  6:11 PM   Specimen: BLOOD  Result Value Ref Range Status   Specimen Description   Final    BLOOD SITE NOT  SPECIFIED Performed at [place] [address] [place], [place] [address]    Special Requests   Final    BOTTLES DRAWN AEROBIC AND ANAEROBIC Blood Culture adequate volume Performed at [place] [address] [place], [place] [address]    Culture   Final    NO GROWTH 4 DAYS Performed at [facility], [place] [address] [place], [place] [address]    Report Status PENDING  Incomplete  Culture, blood (routine x 2)     Status: None (Preliminary result)   Collection Time: 11/08/20 11:22 PM   Specimen: BLOOD  Result Value Ref Range Status   Specimen Description   Final    BLOOD BLOOD LEFT HAND Performed at [employer] [facility] [address], [place], [place] [address]    Special Requests   Final    BOTTLES DRAWN AEROBIC ONLY Blood Culture adequate volume Performed at [place] [address] [place], [place] [address]    Culture   Final    NO GROWTH 3 DAYS Performed at [facility], [place] [address] [place], [place] [address]    Report Status PENDING  Incomplete  Urine Culture     Status: Abnormal   Collection Time: 11/10/20  2:07 PM   Specimen: Urine, Catheterized  Result Value Ref Range Status   Specimen Description   Final    URINE, CATHETERIZED Performed at [place] [address], [place], [place] [address]    Special Requests   Final    NONE Performed at [facility], [place] [address], [place], [place] [address]    Culture 50,000 COLONIES/mL PSEUDOMONAS AERUGINOSA (A)  Final   Report Status 11/12/2020 FINAL  Final   Organism ID, Bacteria PSEUDOMONAS AERUGINOSA (A)  Final      Susceptibility   Pseudomonas aeruginosa - MIC*    CEFTAZIDIME 4 SENSITIVE Sensitive     CIPROFLOXACIN <=0.25 SENSITIVE Sensitive     GENTAMICIN <=1 SENSITIVE Sensitive     IMIPENEM 1 SENSITIVE Sensitive     PIP/TAZO <=4 SENSITIVE Sensitive     CEFEPIME 2  SENSITIVE Sensitive     * 50,000 COLONIES/mL PSEUDOMONAS AERUGINOSA     Time coordinating discharge: 35 minutes  SIGNED:   Cordelia Poche, MD [facility] 11/13/2020, 11:02 AM

## 2020-11-14 LAB — CULTURE, BLOOD (ROUTINE X 2)
Culture: NO GROWTH
Special Requests: ADEQUATE

## 2020-11-18 ENCOUNTER — Inpatient Hospital Stay: Payer: Medicare Other | Attending: Oncology | Admitting: Oncology

## 2020-11-18 ENCOUNTER — Other Ambulatory Visit: Payer: Self-pay

## 2020-11-18 VITALS — BP 117/61 | HR 61 | Temp 97.7°F | Resp 18 | Wt 120.6 lb

## 2020-11-18 DIAGNOSIS — C669 Malignant neoplasm of unspecified ureter: Secondary | ICD-10-CM

## 2020-11-18 NOTE — Progress Notes (Signed)
Reason for the request:    Squamous cell carcinoma of the urethra.  HPI: I was asked by Dr. Claudia Desanctis to evaluate Shelia Chan for the evaluation of history of cancer of the urethra.  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  She presented with lower urinary tract symptoms and weight loss and mass measuring 6 cm in the pelvis.  She underwent a radical cystectomy, urethrectomy and a bilateral lymph node dissection and ileal conduit in October 2015.  The final pathology showed a T4N2 positive margins with 2 out of 28 lymph nodes involved with squamous cell carcinoma.  She received additional therapy utilizing radiation and cisplatin completed in 2016.  She developed bilateral hydronephrosis with bilateral ureteral stents in place.  She relocated from [place] where she had her treatment and currently under surveillance and establish care with Dr. Claudia Desanctis.  She was hospitalized between October 23 November 13, 2020 after presenting with fever and UTI and treated with Zosyn.  During her hospitalization CT scan of the abdomen and pelvis showed no evidence of metastatic disease.  Since her discharge, she reports feeling well and currently finishing course of Cipro.  She denies any fevers, chills or sweats.  She denies any abdominal pain or discomfort.  She does not report any headaches, blurry vision, syncope or seizures. Does not report any fevers, chills or sweats.  Does not report any cough, wheezing or hemoptysis.  Does not report any chest pain, palpitation, orthopnea or leg edema.  Does not report any nausea, vomiting or abdominal pain.  Does not report any constipation or diarrhea.  Does not report any skeletal complaints.    Does not report frequency, urgency or hematuria.  Does not report any skin rashes or lesions. Does not report any heat or cold intolerance.  Does not report any lymphadenopathy or petechiae.  Does not report any anxiety or depression.   Remaining review of systems is negative.     Past Medical History:  Diagnosis Date   Arthritis    Asthma    Blood transfusion without reported diagnosis    Cancer ([facility])    CHF (congestive heart failure) (HCC)    COPD (chronic obstructive pulmonary disease) (HCC)    Heart murmur    Immune deficiency disorder (HCC)    Renal disorder   :   Past Surgical History:  Procedure Laterality Date   BLADDER REMOVAL  118/22/2015   FRACTURE SURGERY Left 02/2018   pins   IR CATHETER TUBE CHANGE  04/15/2019   IR CATHETER TUBE CHANGE  04/15/2019   IR EXT NEPHROURETERAL CATH EXCHANGE  07/15/2019   IR EXT NEPHROURETERAL CATH EXCHANGE  10/14/2019   IR EXT NEPHROURETERAL CATH EXCHANGE  10/14/2019   IR EXT NEPHROURETERAL CATH EXCHANGE  01/06/2020   IR EXT NEPHROURETERAL CATH EXCHANGE  03/16/2020   IR EXT NEPHROURETERAL CATH EXCHANGE  05/25/2020   IR EXT NEPHROURETERAL CATH EXCHANGE  07/17/2020   IR EXT NEPHROURETERAL CATH EXCHANGE  07/17/2020   IR EXT NEPHROURETERAL CATH EXCHANGE  08/14/2020   IR EXT NEPHROURETERAL CATH EXCHANGE  09/25/2020   IR EXT NEPHROURETERAL CATH EXCHANGE  11/06/2020  :   Current Outpatient Medications:    albuterol (VENTOLIN HFA) 108 (90 Base) MCG/ACT inhaler, Inhale 1 puff into the lungs every 6 (six) hours as needed for shortness of breath., Disp: , Rfl:    ARIPiprazole (ABILIFY) 30 MG tablet, Take 30 mg by mouth daily., Disp: , Rfl:  aspirin EC 81 MG tablet, Take 81 mg by mouth daily., Disp: , Rfl:    B Complex-C (B-COMPLEX WITH VITAMIN C) tablet, Take 1 tablet by mouth daily., Disp: , Rfl:    Bacillus Coagulans-Inulin (PROBIOTIC) 1-250 BILLION-MG CAPS, Take 1 capsule by mouth daily., Disp: , Rfl:    Cholecalciferol (VITAMIN D-3) 25 MCG (1000 UT) CAPS, Take 2,000 Units by mouth daily., Disp: , Rfl:    ciprofloxacin (CIPRO) 500 MG tablet, Take 1 tablet (500 mg total) by mouth daily with breakfast for 9 days., Disp: 9 tablet, Rfl: 0   DULoxetine (CYMBALTA) 30 MG capsule, Take 30  mg by mouth at bedtime., Disp: , Rfl:    furosemide (LASIX) 40 MG tablet, Take 1 tablet (40 mg total) by mouth daily., Disp: 90 tablet, Rfl: 3   KLOR-CON M20 20 MEQ tablet, TAKE 1 TABLET BY MOUTH EVERY DAY (Patient taking differently: Take 20 mEq by mouth daily.), Disp: 90 tablet, Rfl: 3   Multiple Vitamin (MULTIVITAMIN ADULT PO), Take 1 tablet by mouth daily., Disp: , Rfl:    vitamin C (ASCORBIC ACID) 500 MG tablet, Take 500 mg by mouth daily., Disp: , Rfl: :   Allergies  Allergen Reactions   Metoprolol Shortness Of Breath  :   Family History  Problem Relation Age of Onset   Heart disease Maternal Grandfather   :   Social History   Socioeconomic History   Marital status: Divorced    Spouse name: Not on file   Number of children: Not on file   Years of education: Not on file   Highest education level: Not on file  Occupational History   Occupation: disabled  Tobacco Use   Smoking status: Former    Packs/day: 1.00    Years: 50.00    Pack years: 50.00    Types: Cigarettes    Quit date: 01/21/2019    Years since quitting: 1.8   Smokeless tobacco: Never  Vaping Use   Vaping Use: Never used  Substance and Sexual Activity   Alcohol use: Not Currently   Drug use: Yes    Types: Marijuana    Comment: occasional    Sexual activity: Not on file  Other Topics Concern   Not on file  Social History Narrative   Not on file   Social Determinants of Health   Financial Resource Strain: Not on file  Food Insecurity: Not on file  Transportation Needs: Not on file  Physical Activity: Not on file  Stress: Not on file  Social Connections: Not on file  Intimate Partner Violence: Not on file  :  Pertinent items are noted in HPI.  Exam: Blood pressure 117/61, pulse 61, temperature 97.7 F (36.5 C), resp. rate 18, weight 120 lb 9.6 oz (54.7 kg), SpO2 100 %. ECOG 0 General appearance: alert and cooperative appeared without distress. Head: atraumatic without any  abnormalities. Eyes: conjunctivae/corneas clear. PERRL.  Sclera anicteric. Throat: lips, mucosa, and tongue normal; without oral thrush or ulcers. Resp: clear to auscultation bilaterally without rhonchi, wheezes or dullness to percussion. Cardio: regular rate and rhythm, S1, S2 normal, no murmur, click, rub or gallop GI: soft, non-tender; bowel sounds normal; no masses,  no organomegaly Skin: Skin color, texture, turgor normal. No rashes or lesions Lymph nodes: Cervical, supraclavicular, and axillary nodes normal. Neurologic: Grossly normal without any motor, sensory or deep tendon reflexes. Musculoskeletal: No joint deformity or effusion.    CT Abdomen Pelvis Wo Contrast  Result Date: 11/08/2020 CLINICAL DATA:  Hematuria, unknown cause EXAM: CT ABDOMEN AND PELVIS WITHOUT CONTRAST TECHNIQUE: Multidetector CT imaging of the abdomen and pelvis was performed following the standard protocol without IV contrast. COMPARISON:  July 17, 2020. FINDINGS: Evaluation is limited by lack of IV contrast. Lower chest: No acute abnormality. Hepatobiliary: Unremarkable noncontrast appearance the liver and gallbladder. Pancreas: No peripancreatic fat stranding. Spleen: Unremarkable. Adrenals/Urinary Tract: Patient is status post cystectomy with diverting urostomy. RIGHT-sided nephroureteral stent is located within the pelvis with tubing coursing along the expected course of the ureter and external to patient. There is low positioning of the LEFT-sided nephroureteral stent along the course of the proximal to mid LEFT ureter with partial kinking of the inferior aspect of the pigtail. Positioning is similar in comparison to prior with increased interval kinking. Resolution of RIGHT-sided hydronephrosis. Unchanged mild prominence of the LEFT renal pelvis. Atrophic appearance of the LEFT kidney. There is a small amount of high density within the superior pole of the RIGHT kidney collecting system (series 5, image 70).  Hemorrhagic or proteinaceous cysts of the superior pole of the LEFT kidney. Unchanged appearance of the adrenal glands. Stomach/Bowel: No definite evidence of bowel obstruction. RIGHT lower quadrant ileal conduit. There are several fecalized loops of small bowel without frank dilation within the pelvis which could reflect slow transit. Transition point is near the surgical anastomoses. Vascular/Lymphatic: Multiple surgical clips along the pelvic sidewalls. Evaluation for lymphadenopathy is limited due to lack of IV contrast and lack of intra-abdominal fat. No definitive new lymphadenopathy identified. Reproductive: Status post hysterectomy. Other: No free air. Musculoskeletal: Status post pinning of the LEFT femur. IMPRESSION: 1. No hydronephrosis bilaterally. There is persistent positioning of the LEFT nephroureteral stent in the proximal ureter. There is mildly increased kinking along its course in comparison to prior. 2. There is subtle high density material in the superior pole of the RIGHT kidney. This could reflect blood products versus medullary calcifications. 3. There are several fecalized loops of bowel without frank dilation within the pelvis near the anastomosis which could reflect slow transit versus early bowel obstruction. Aortic Atherosclerosis (ICD10-I70.0). Electronically Signed   By: Valentino Saxon M.D.   On: 11/08/2020 14:53   IR EXT NEPHROURETERAL CATH EXCHANGE    Assessment and Plan:   65 year old with:  1.  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  She presented with a large pelvic mass that required surgical resection and ileal conduit formation.  She received adjuvant cisplatin and radiation therapy concluded in 2016.  She remains disease-free without any evidence of relapse at this time.  Imaging studies obtained in October 2022 showed no evidence of metastatic disease.  The natural course of this disease and risk of relapse was assessed.  At this time  she appears to have low risk of metastasis but certainly warrants continued active surveillance.  Salvage therapy with chemotherapy would be deferred unless metastatic disease is detected.  At this time I recommended annual visits with a sooner evaluation if she has any symptoms.  2.  Bilateral hydronephrosis: Ureteral stents are currently in place with exchange every 6 weeks.  3.  Recurrent UTI: She is currently on Cipro to be completed in the near future.  4.  Chronic renal insufficiency: Related to hydronephrosis and previous kidney damage.  Kidney function appears stable.  5.  Follow-up: In 1 year.  I am happy to see her sooner if needed.  60  minutes were dedicated to this visit. The time was spent on reviewing laboratory  data, imaging studies, discussing treatment options, and answering questions regarding future plan.     A copy of this consult has been forwarded to the requesting physician.

## 2020-11-19 ENCOUNTER — Telehealth: Payer: Self-pay | Admitting: Oncology

## 2020-11-19 NOTE — Telephone Encounter (Signed)
Scheduled per 1/12 los, pt has been called and confirmed appt °

## 2020-12-18 ENCOUNTER — Other Ambulatory Visit (HOSPITAL_COMMUNITY): Payer: Medicare Other

## 2020-12-24 ENCOUNTER — Other Ambulatory Visit: Payer: Self-pay | Admitting: Student

## 2020-12-25 ENCOUNTER — Other Ambulatory Visit (HOSPITAL_COMMUNITY): Payer: Self-pay | Admitting: Interventional Radiology

## 2020-12-25 ENCOUNTER — Ambulatory Visit (HOSPITAL_COMMUNITY)
Admission: RE | Admit: 2020-12-25 | Discharge: 2020-12-25 | Disposition: A | Discharge status: Home / Self Care | Payer: Medicare Other | Source: Ambulatory Visit | Attending: Interventional Radiology | Admitting: Interventional Radiology

## 2020-12-25 ENCOUNTER — Other Ambulatory Visit: Payer: Self-pay

## 2020-12-25 DIAGNOSIS — Z8551 Personal history of malignant neoplasm of bladder: Secondary | ICD-10-CM | POA: Diagnosis not present

## 2020-12-25 DIAGNOSIS — Z436 Encounter for attention to other artificial openings of urinary tract: Secondary | ICD-10-CM | POA: Insufficient documentation

## 2020-12-25 HISTORY — PX: IR EXT NEPHROURETERAL CATH EXCHANGE: IMG5418

## 2020-12-25 MED ORDER — IOHEXOL 300 MG/ML  SOLN
25.0000 mL | Freq: Once | INTRAMUSCULAR | Status: AC | PRN
  Administered 2020-12-25: 15 mL

## 2020-12-25 NOTE — Procedures (Addendum)
Interventional Radiology Procedure:   Indications: History of ureter cancer  Procedure: Exchange of bilateral nephroureteral catheters via ileal conduit  Findings: 12 Fr drains in renal pelvis bilaterally  Complications: No immediate complications noted.     EBL: Minimal  Plan: Plan for 6 week routine exchange   Derk Doubek R. Anselm Pancoast, MD  Pager: [phone]

## 2021-02-05 ENCOUNTER — Other Ambulatory Visit (HOSPITAL_COMMUNITY): Payer: Medicare Other

## 2021-02-09 ENCOUNTER — Ambulatory Visit (HOSPITAL_COMMUNITY)
Admission: RE | Admit: 2021-02-09 | Discharge: 2021-02-09 | Disposition: A | Discharge status: Home / Self Care | Payer: Medicare Other | Source: Ambulatory Visit | Attending: Interventional Radiology | Admitting: Interventional Radiology

## 2021-02-09 ENCOUNTER — Other Ambulatory Visit (HOSPITAL_COMMUNITY): Payer: Self-pay | Admitting: Interventional Radiology

## 2021-02-09 DIAGNOSIS — Z8551 Personal history of malignant neoplasm of bladder: Secondary | ICD-10-CM

## 2021-02-09 DIAGNOSIS — Z436 Encounter for attention to other artificial openings of urinary tract: Secondary | ICD-10-CM | POA: Insufficient documentation

## 2021-02-09 HISTORY — PX: IR EXT NEPHROURETERAL CATH EXCHANGE: IMG5418

## 2021-02-09 MED ORDER — IOHEXOL 300 MG/ML  SOLN
25.0000 mL | Freq: Once | INTRAMUSCULAR | Status: AC | PRN
  Administered 2021-02-09: 09:00:00 12 mL

## 2021-03-13 ENCOUNTER — Encounter (HOSPITAL_COMMUNITY): Payer: Self-pay | Admitting: *Deleted

## 2021-03-13 ENCOUNTER — Inpatient Hospital Stay (HOSPITAL_COMMUNITY)
Admission: EM | Admit: 2021-03-13 | Discharge: 2021-03-25 | DRG: 477 | Disposition: A | Discharge status: Home Health | Payer: Medicare Other | Attending: Internal Medicine | Admitting: Internal Medicine

## 2021-03-13 ENCOUNTER — Other Ambulatory Visit: Payer: Self-pay

## 2021-03-13 DIAGNOSIS — Z7982 Long term (current) use of aspirin: Secondary | ICD-10-CM

## 2021-03-13 DIAGNOSIS — N898 Other specified noninflammatory disorders of vagina: Secondary | ICD-10-CM | POA: Diagnosis present

## 2021-03-13 DIAGNOSIS — C680 Malignant neoplasm of urethra: Secondary | ICD-10-CM | POA: Diagnosis present

## 2021-03-13 DIAGNOSIS — Z960 Presence of urogenital implants: Secondary | ICD-10-CM | POA: Diagnosis present

## 2021-03-13 DIAGNOSIS — Z66 Do not resuscitate: Secondary | ICD-10-CM | POA: Diagnosis present

## 2021-03-13 DIAGNOSIS — M609 Myositis, unspecified: Secondary | ICD-10-CM | POA: Diagnosis present

## 2021-03-13 DIAGNOSIS — Z888 Allergy status to other drugs, medicaments and biological substances status: Secondary | ICD-10-CM

## 2021-03-13 DIAGNOSIS — F418 Other specified anxiety disorders: Secondary | ICD-10-CM | POA: Diagnosis present

## 2021-03-13 DIAGNOSIS — B9620 Unspecified Escherichia coli [E. coli] as the cause of diseases classified elsewhere: Secondary | ICD-10-CM | POA: Diagnosis present

## 2021-03-13 DIAGNOSIS — Z923 Personal history of irradiation: Secondary | ICD-10-CM

## 2021-03-13 DIAGNOSIS — S72113A Displaced fracture of greater trochanter of unspecified femur, initial encounter for closed fracture: Secondary | ICD-10-CM | POA: Diagnosis present

## 2021-03-13 DIAGNOSIS — Z906 Acquired absence of other parts of urinary tract: Secondary | ICD-10-CM

## 2021-03-13 DIAGNOSIS — Z20822 Contact with and (suspected) exposure to covid-19: Secondary | ICD-10-CM | POA: Diagnosis present

## 2021-03-13 DIAGNOSIS — Z1624 Resistance to multiple antibiotics: Secondary | ICD-10-CM | POA: Diagnosis present

## 2021-03-13 DIAGNOSIS — N3000 Acute cystitis without hematuria: Secondary | ICD-10-CM

## 2021-03-13 DIAGNOSIS — M8618 Other acute osteomyelitis, other site: Principal | ICD-10-CM | POA: Diagnosis present

## 2021-03-13 DIAGNOSIS — Z8249 Family history of ischemic heart disease and other diseases of the circulatory system: Secondary | ICD-10-CM

## 2021-03-13 DIAGNOSIS — T3695XA Adverse effect of unspecified systemic antibiotic, initial encounter: Secondary | ICD-10-CM | POA: Diagnosis present

## 2021-03-13 DIAGNOSIS — M7989 Other specified soft tissue disorders: Secondary | ICD-10-CM | POA: Diagnosis present

## 2021-03-13 DIAGNOSIS — N390 Urinary tract infection, site not specified: Secondary | ICD-10-CM | POA: Diagnosis present

## 2021-03-13 DIAGNOSIS — I5032 Chronic diastolic (congestive) heart failure: Secondary | ICD-10-CM | POA: Diagnosis present

## 2021-03-13 DIAGNOSIS — F17210 Nicotine dependence, cigarettes, uncomplicated: Secondary | ICD-10-CM | POA: Diagnosis present

## 2021-03-13 DIAGNOSIS — N184 Chronic kidney disease, stage 4 (severe): Secondary | ICD-10-CM | POA: Diagnosis present

## 2021-03-13 DIAGNOSIS — M869 Osteomyelitis, unspecified: Secondary | ICD-10-CM | POA: Diagnosis present

## 2021-03-13 DIAGNOSIS — F419 Anxiety disorder, unspecified: Secondary | ICD-10-CM | POA: Diagnosis present

## 2021-03-13 DIAGNOSIS — Z8559 Personal history of malignant neoplasm of other urinary tract organ: Secondary | ICD-10-CM

## 2021-03-13 DIAGNOSIS — M86159 Other acute osteomyelitis, unspecified femur: Secondary | ICD-10-CM

## 2021-03-13 DIAGNOSIS — J449 Chronic obstructive pulmonary disease, unspecified: Secondary | ICD-10-CM | POA: Diagnosis present

## 2021-03-13 DIAGNOSIS — Z9221 Personal history of antineoplastic chemotherapy: Secondary | ICD-10-CM

## 2021-03-13 DIAGNOSIS — Z79899 Other long term (current) drug therapy: Secondary | ICD-10-CM

## 2021-03-13 DIAGNOSIS — S72114A Nondisplaced fracture of greater trochanter of right femur, initial encounter for closed fracture: Secondary | ICD-10-CM | POA: Diagnosis present

## 2021-03-13 DIAGNOSIS — K521 Toxic gastroenteritis and colitis: Secondary | ICD-10-CM | POA: Diagnosis present

## 2021-03-13 DIAGNOSIS — D75839 Thrombocytosis, unspecified: Secondary | ICD-10-CM | POA: Diagnosis present

## 2021-03-13 DIAGNOSIS — M1990 Unspecified osteoarthritis, unspecified site: Secondary | ICD-10-CM | POA: Diagnosis present

## 2021-03-13 DIAGNOSIS — B965 Pseudomonas (aeruginosa) (mallei) (pseudomallei) as the cause of diseases classified elsewhere: Secondary | ICD-10-CM | POA: Diagnosis present

## 2021-03-13 DIAGNOSIS — Z90710 Acquired absence of both cervix and uterus: Secondary | ICD-10-CM

## 2021-03-13 DIAGNOSIS — F32A Depression, unspecified: Secondary | ICD-10-CM | POA: Diagnosis present

## 2021-03-13 DIAGNOSIS — E861 Hypovolemia: Secondary | ICD-10-CM | POA: Diagnosis present

## 2021-03-13 MED ORDER — SODIUM CHLORIDE 0.9 % IV BOLUS
1000.0000 mL | Freq: Once | INTRAVENOUS | Status: AC
  Administered 2021-03-13: 1000 mL via INTRAVENOUS

## 2021-03-13 MED ORDER — ONDANSETRON HCL 4 MG/2ML IJ SOLN
4.0000 mg | Freq: Once | INTRAMUSCULAR | Status: AC
  Administered 2021-03-13: 4 mg via INTRAVENOUS
  Filled 2021-03-13: qty 2

## 2021-03-13 MED ORDER — MORPHINE SULFATE (PF) 4 MG/ML IV SOLN
4.0000 mg | Freq: Once | INTRAVENOUS | Status: AC
  Administered 2021-03-13: 4 mg via INTRAVENOUS
  Filled 2021-03-13: qty 1

## 2021-03-13 NOTE — ED Provider Notes (Signed)
[facility] DEPT Provider Note   CSN: [id number] Arrival date & time: 03/13/21  2259     History  Chief Complaint  Patient presents with   Abdominal Pain    Shelia Chan is a 66 y.o. female.  66 yo F with a chief complaints of lower abdominal discomfort.  This been going on for about a week now.  She was seen and diagnosed with a urinary tract infection and is just about finished her antibiotics.  She feels like she is getting worse and has developed fevers.  As high as 101 at home.  Improved somewhat with Tylenol but she started to feel feverish again.  Has had some nausea without vomiting.  No diarrhea.  Denies cough congestion.   Abdominal Pain     Home Medications Prior to Admission medications   Medication Sig Start Date End Date Taking? Authorizing Provider  acetaminophen (TYLENOL) 500 MG tablet Take 500-1,000 mg by mouth every 6 (six) hours as needed for moderate pain or fever.   Yes [provider]  albuterol (VENTOLIN HFA) 108 (90 Base) MCG/ACT inhaler Inhale 1 puff into the lungs every 6 (six) hours as needed for shortness of breath. 05/22/20  Yes [provider]  ARIPiprazole (ABILIFY) 30 MG tablet Take 30 mg by mouth daily. 03/19/19  Yes [provider]  aspirin EC 81 MG tablet Take 81 mg by mouth daily.   Yes [provider]  atorvastatin (LIPITOR) 10 MG tablet Take 10 mg by mouth at bedtime. 02/07/21  Yes [provider]  B Complex-C (B-COMPLEX WITH VITAMIN C) tablet Take 1 tablet by mouth daily.   Yes [provider]  busPIRone (BUSPAR) 7.5 MG tablet Take 7.5 mg by mouth 2 (two) times daily. 02/22/21  Yes [provider]  calcitRIOL (ROCALTROL) 0.25 MCG capsule Take 0.25 mcg by mouth daily. 02/12/21  Yes [provider]  Cholecalciferol (VITAMIN D-3) 25 MCG (1000 UT) CAPS Take 2,000 Units by mouth daily.   Yes [provider]  DULoxetine (CYMBALTA) 30 MG capsule  Take 30 mg by mouth at bedtime. 06/21/20  Yes [provider]  ferrous sulfate 324 MG TBEC Take 324 mg by mouth daily with breakfast.   Yes [provider]  furosemide (LASIX) 40 MG tablet Take 40 mg by mouth daily.   Yes [provider]  KLOR-CON M20 20 MEQ tablet TAKE 1 TABLET BY MOUTH EVERY DAY Patient taking differently: Take 20 mEq by mouth daily. 10/28/20  Yes O'Neal, Cassie Freer, MD  methocarbamol (ROBAXIN) 500 MG tablet Take 500 mg by mouth 3 (three) times daily as needed for muscle spasms. 03/08/21  Yes [provider]  Multiple Vitamin (MULTIVITAMIN ADULT PO) Take 1 tablet by mouth daily.   Yes [provider]  pantoprazole (PROTONIX) 40 MG tablet Take 40 mg by mouth daily. 01/28/21  Yes [provider]  Probiotic Product (PROBIOTIC PO) Take 1 capsule by mouth daily.   Yes [provider]  vitamin C (ASCORBIC ACID) 500 MG tablet Take 500 mg by mouth daily.   Yes [provider]      Allergies    Metoprolol    Review of Systems   Review of Systems  Gastrointestinal:  Positive for abdominal pain.   Physical Exam Updated Vital Signs BP 109/75    Pulse 73    Temp 99.1 F (37.3 C) (Oral)    Resp (!) 21    SpO2 99%  Physical Exam Vitals  and nursing note reviewed.  Constitutional:      General: She is not in acute distress.    Appearance: She is well-developed. She is not diaphoretic.  HENT:     Head: Normocephalic and atraumatic.  Eyes:     Pupils: Pupils are equal, round, and reactive to light.  Cardiovascular:     Rate and Rhythm: Normal rate and regular rhythm.     Heart sounds: No murmur heard.   No friction rub. No gallop.  Pulmonary:     Effort: Pulmonary effort is normal.     Breath sounds: No wheezing or rales.  Abdominal:     General: There is no distension.     Palpations: Abdomen is soft.     Tenderness: There is abdominal tenderness.     Comments: Tenderness and mild erythema just above  the pubic bone.  Urostomy in place without obvious tenderness.   Musculoskeletal:        General: No tenderness.     Cervical back: Normal range of motion and neck supple.  Skin:    General: Skin is warm and dry.  Neurological:     Mental Status: She is alert and oriented to person, place, and time.  Psychiatric:        Behavior: Behavior normal.    ED Results / Procedures / Treatments   Labs (all labs ordered are listed, but only abnormal results are displayed) Labs Reviewed  CBC WITH DIFFERENTIAL/PLATELET - Abnormal; Notable for the following components:      Result Value   WBC 13.0 (*)    Hemoglobin 11.9 (*)    Platelets 468 (*)    Neutro Abs 10.5 (*)    Monocytes Absolute 1.1 (*)    All other components within normal limits  COMPREHENSIVE METABOLIC PANEL - Abnormal; Notable for the following components:   Sodium 134 (*)    Chloride 94 (*)    BUN 31 (*)    Creatinine, Ser 2.13 (*)    Total Protein 8.2 (*)    GFR, Estimated 25 (*)    All other components within normal limits  URINALYSIS, ROUTINE W REFLEX MICROSCOPIC - Abnormal; Notable for the following components:   Color, Urine STRAW (*)    APPearance HAZY (*)    Specific Gravity, Urine 1.004 (*)    Hgb urine dipstick MODERATE (*)    Nitrite POSITIVE (*)    Leukocytes,Ua LARGE (*)    WBC, UA >50 (*)    Bacteria, UA MANY (*)    All other components within normal limits  RESP PANEL BY RT-PCR (FLU A&B, COVID) ARPGX2  CULTURE, BLOOD (ROUTINE X 2)  CULTURE, BLOOD (ROUTINE X 2)  URINE CULTURE  LIPASE, BLOOD  LACTIC ACID, PLASMA  BASIC METABOLIC PANEL  CBC    EKG None  Radiology CT ABDOMEN PELVIS WO CONTRAST  Result Date: 03/14/2021 CLINICAL DATA:  Lower abdominal pain EXAM: CT ABDOMEN AND PELVIS WITHOUT CONTRAST TECHNIQUE: Multidetector CT imaging of the abdomen and pelvis was performed following the standard protocol without IV contrast. RADIATION DOSE REDUCTION: This exam was performed according to the  departmental dose-optimization program which includes automated exposure control, adjustment of the mA and/or kV according to patient size and/or use of iterative reconstruction technique. COMPARISON:  11/08/2020 FINDINGS: Lower chest: No acute abnormality. Hepatobiliary: No focal hepatic abnormality. Gallbladder unremarkable. Pancreas: No focal abnormality or ductal dilatation. Spleen: No focal abnormality.  Normal size. Adrenals/Urinary Tract: Prior cystectomy with right lower quadrant urostomy. Ureteral stents extend  through the urostomy and into the renal pelvis bilaterally. Left renal atrophy. No hydronephrosis. Adrenal glands unremarkable. Stomach/Bowel: No bowel obstruction. Stomach, large and small bowel grossly unremarkable. Moderate stool burden throughout the colon. Vascular/Lymphatic: Aortic atherosclerosis. No evidence of aneurysm or adenopathy. Reproductive: Prior hysterectomy.  No adnexal masses. Other: No free fluid or free air. Musculoskeletal: No acute bony abnormality. IMPRESSION: Changes of cystectomy with right lower quadrant urostomy. Ureteral stents remain in place. No hydronephrosis. Moderate stool burden in the colon. No acute findings. Aortic atherosclerosis. Electronically Signed   By: Rolm Baptise M.D.   On: 03/14/2021 01:31    Procedures Procedures    Medications Ordered in ED Medications  piperacillin-tazobactam (ZOSYN) IVPB 3.375 g (3.375 g Intravenous New Bag/Given 03/14/21 0300)  heparin injection 5,000 Units (has no administration in time range)  acetaminophen (TYLENOL) tablet 650 mg (has no administration in time range)    Or  acetaminophen (TYLENOL) suppository 650 mg (has no administration in time range)  ondansetron (ZOFRAN) tablet 4 mg (has no administration in time range)    Or  ondansetron (ZOFRAN) injection 4 mg (has no administration in time range)  aspirin EC tablet 81 mg (has no administration in time range)  atorvastatin (LIPITOR) tablet 10 mg (has no  administration in time range)  ARIPiprazole (ABILIFY) tablet 30 mg (has no administration in time range)  busPIRone (BUSPAR) tablet 7.5 mg (has no administration in time range)  DULoxetine (CYMBALTA) DR capsule 30 mg (has no administration in time range)  pantoprazole (PROTONIX) EC tablet 40 mg (has no administration in time range)  methocarbamol (ROBAXIN) tablet 500 mg (has no administration in time range)  albuterol (VENTOLIN HFA) 108 (90 Base) MCG/ACT inhaler 1 puff (has no administration in time range)  fentaNYL (SUBLIMAZE) injection 25-50 mcg (has no administration in time range)  sodium chloride 0.9 % bolus 1,000 mL ( Intravenous Infusion Verify 03/14/21 0158)  sodium chloride 0.9 % bolus 1,000 mL ( Intravenous Infusion Verify 03/14/21 0158)  morphine (PF) 4 MG/ML injection 4 mg (4 mg Intravenous Given 03/13/21 2358)  ondansetron (ZOFRAN) injection 4 mg (4 mg Intravenous Given 03/13/21 2357)  morphine (PF) 4 MG/ML injection 4 mg (4 mg Intravenous Given 03/14/21 0302)  ondansetron (ZOFRAN) injection 4 mg (4 mg Intravenous Given 03/14/21 0302)    ED Course/ Medical Decision Making/ A&P                           Medical Decision Making Amount and/or Complexity of Data Reviewed Labs: ordered. Radiology: ordered.  Risk Prescription drug management. Decision regarding hospitalization.   Patient is a 66 y.o. female with a cc of lower abdominal discomfort and fever.  The patient unfortunately has a history of a pelvic mass and is status post resection and has urostomy.  She has had worsening lower abdominal discomfort and fever despite being on oral antibiotics(cephalexin) as an outpatient for presumed urinary tract infection.  We will obtain a laboratory evaluation here.  Blood cultures urine cultures CT scan of the abdomen pelvis with contrast.  Treat pain and nausea here.  Reassess. I reviewed the patients chart and she has a history of cancer of the urethra most recent imaging without  recurrent disease.  I independently interpreted the patients labs and imaging mild leukocytosis of 13,000.  Neutrophilic predominance lactate normal mild AKI with elevated BUN most consistent with dehydration.  LFTs and lipase are unremarkable.  CT scan had to be performed without contrast  per protocol with creatinine greater than 2, without limitation no obvious concerning finding on CT.    Patient's UA is concerning for urinary tract infection as it sounds like she has failed outpatient therapy with a round of antibiotics I will broaden her.  On record review the patient most recent admission she was on Zosyn.  I looked back at her urine micro test and she had a recent Pseudomonas culture that was pansensitive, she unfortunately also had an E. coli that was resistant.  We will start her back on Zosyn.  Will discuss with the hospitalist.  The patients results and plan were reviewed and discussed.   Any x-rays performed were independently reviewed by myself.   Differential diagnosis were considered with the presenting HPI.  Medications  piperacillin-tazobactam (ZOSYN) IVPB 3.375 g (3.375 g Intravenous New Bag/Given 03/14/21 0300)  heparin injection 5,000 Units (has no administration in time range)  acetaminophen (TYLENOL) tablet 650 mg (has no administration in time range)    Or  acetaminophen (TYLENOL) suppository 650 mg (has no administration in time range)  ondansetron (ZOFRAN) tablet 4 mg (has no administration in time range)    Or  ondansetron (ZOFRAN) injection 4 mg (has no administration in time range)  aspirin EC tablet 81 mg (has no administration in time range)  atorvastatin (LIPITOR) tablet 10 mg (has no administration in time range)  ARIPiprazole (ABILIFY) tablet 30 mg (has no administration in time range)  busPIRone (BUSPAR) tablet 7.5 mg (has no administration in time range)  DULoxetine (CYMBALTA) DR capsule 30 mg (has no administration in time range)  pantoprazole (PROTONIX) EC  tablet 40 mg (has no administration in time range)  methocarbamol (ROBAXIN) tablet 500 mg (has no administration in time range)  albuterol (VENTOLIN HFA) 108 (90 Base) MCG/ACT inhaler 1 puff (has no administration in time range)  fentaNYL (SUBLIMAZE) injection 25-50 mcg (has no administration in time range)  sodium chloride 0.9 % bolus 1,000 mL ( Intravenous Infusion Verify 03/14/21 0158)  sodium chloride 0.9 % bolus 1,000 mL ( Intravenous Infusion Verify 03/14/21 0158)  morphine (PF) 4 MG/ML injection 4 mg (4 mg Intravenous Given 03/13/21 2358)  ondansetron (ZOFRAN) injection 4 mg (4 mg Intravenous Given 03/13/21 2357)  morphine (PF) 4 MG/ML injection 4 mg (4 mg Intravenous Given 03/14/21 0302)  ondansetron (ZOFRAN) injection 4 mg (4 mg Intravenous Given 03/14/21 0302)    Vitals:   03/14/21 0000 03/14/21 0030 03/14/21 0100 03/14/21 0240  BP: 114/68 106/66 112/64 109/75  Pulse: 70 66 66 73  Resp: (!) 25 (!) 22 (!) 22 (!) 21  Temp:      TempSrc:      SpO2: 99% 97% 98% 99%    Final diagnoses:  Acute cystitis without hematuria    Admission/ observation were discussed with the admitting physician, patient and/or family and they are comfortable with the plan.            Final Clinical Impression(s) / ED Diagnoses Final diagnoses:  Acute cystitis without hematuria    Rx / DC Orders ED Discharge Orders     None         Deno Etienne, DO 03/14/21 6153

## 2021-03-13 NOTE — ED Triage Notes (Signed)
Pt says that she has had some lower abdominal pain, says just below her urostomy site. Started having a fever about 2 days ago and took tylenol for fever for the same. Has had some nausea.

## 2021-03-13 NOTE — ED Triage Notes (Signed)
Pt arrives from home via [facility], per report she has had abdominal pain for about 3 days, hx of bladder cancer, illeostomy, ckd 3. Recent UTI and completion of abx for the same.  En route 112/78, hr 70, 97% ra, rr 16, 97.3. 101.4 at home, took tylenol around 2000 for the same.

## 2021-03-14 ENCOUNTER — Emergency Department (HOSPITAL_COMMUNITY): Payer: Medicare Other

## 2021-03-14 ENCOUNTER — Encounter (HOSPITAL_COMMUNITY): Payer: Self-pay | Admitting: Family Medicine

## 2021-03-14 DIAGNOSIS — N184 Chronic kidney disease, stage 4 (severe): Secondary | ICD-10-CM

## 2021-03-14 DIAGNOSIS — Z20822 Contact with and (suspected) exposure to covid-19: Secondary | ICD-10-CM | POA: Diagnosis present

## 2021-03-14 DIAGNOSIS — M60859 Other myositis, unspecified thigh: Secondary | ICD-10-CM | POA: Diagnosis not present

## 2021-03-14 DIAGNOSIS — N898 Other specified noninflammatory disorders of vagina: Secondary | ICD-10-CM | POA: Diagnosis present

## 2021-03-14 DIAGNOSIS — Z906 Acquired absence of other parts of urinary tract: Secondary | ICD-10-CM | POA: Diagnosis not present

## 2021-03-14 DIAGNOSIS — S72114A Nondisplaced fracture of greater trochanter of right femur, initial encounter for closed fracture: Secondary | ICD-10-CM | POA: Diagnosis present

## 2021-03-14 DIAGNOSIS — Z923 Personal history of irradiation: Secondary | ICD-10-CM | POA: Diagnosis not present

## 2021-03-14 DIAGNOSIS — Z9221 Personal history of antineoplastic chemotherapy: Secondary | ICD-10-CM | POA: Diagnosis not present

## 2021-03-14 DIAGNOSIS — N390 Urinary tract infection, site not specified: Secondary | ICD-10-CM

## 2021-03-14 DIAGNOSIS — B9620 Unspecified Escherichia coli [E. coli] as the cause of diseases classified elsewhere: Secondary | ICD-10-CM | POA: Diagnosis present

## 2021-03-14 DIAGNOSIS — K521 Toxic gastroenteritis and colitis: Secondary | ICD-10-CM | POA: Diagnosis present

## 2021-03-14 DIAGNOSIS — Z8249 Family history of ischemic heart disease and other diseases of the circulatory system: Secondary | ICD-10-CM | POA: Diagnosis not present

## 2021-03-14 DIAGNOSIS — F419 Anxiety disorder, unspecified: Secondary | ICD-10-CM | POA: Diagnosis present

## 2021-03-14 DIAGNOSIS — F32A Depression, unspecified: Secondary | ICD-10-CM | POA: Diagnosis present

## 2021-03-14 DIAGNOSIS — F418 Other specified anxiety disorders: Secondary | ICD-10-CM | POA: Diagnosis present

## 2021-03-14 DIAGNOSIS — M8618 Other acute osteomyelitis, other site: Secondary | ICD-10-CM | POA: Diagnosis not present

## 2021-03-14 DIAGNOSIS — F17210 Nicotine dependence, cigarettes, uncomplicated: Secondary | ICD-10-CM | POA: Diagnosis present

## 2021-03-14 DIAGNOSIS — I5032 Chronic diastolic (congestive) heart failure: Secondary | ICD-10-CM | POA: Diagnosis present

## 2021-03-14 DIAGNOSIS — E861 Hypovolemia: Secondary | ICD-10-CM | POA: Diagnosis present

## 2021-03-14 DIAGNOSIS — R319 Hematuria, unspecified: Secondary | ICD-10-CM

## 2021-03-14 DIAGNOSIS — J449 Chronic obstructive pulmonary disease, unspecified: Secondary | ICD-10-CM | POA: Diagnosis present

## 2021-03-14 DIAGNOSIS — E1169 Type 2 diabetes mellitus with other specified complication: Secondary | ICD-10-CM | POA: Diagnosis not present

## 2021-03-14 DIAGNOSIS — M869 Osteomyelitis, unspecified: Secondary | ICD-10-CM | POA: Diagnosis not present

## 2021-03-14 DIAGNOSIS — R011 Cardiac murmur, unspecified: Secondary | ICD-10-CM | POA: Diagnosis not present

## 2021-03-14 DIAGNOSIS — Z66 Do not resuscitate: Secondary | ICD-10-CM | POA: Diagnosis present

## 2021-03-14 DIAGNOSIS — M609 Myositis, unspecified: Secondary | ICD-10-CM | POA: Diagnosis present

## 2021-03-14 DIAGNOSIS — Z1624 Resistance to multiple antibiotics: Secondary | ICD-10-CM | POA: Diagnosis present

## 2021-03-14 DIAGNOSIS — B965 Pseudomonas (aeruginosa) (mallei) (pseudomallei) as the cause of diseases classified elsewhere: Secondary | ICD-10-CM | POA: Diagnosis present

## 2021-03-14 DIAGNOSIS — D75839 Thrombocytosis, unspecified: Secondary | ICD-10-CM | POA: Diagnosis present

## 2021-03-14 DIAGNOSIS — T3695XA Adverse effect of unspecified systemic antibiotic, initial encounter: Secondary | ICD-10-CM | POA: Diagnosis present

## 2021-03-14 LAB — URINALYSIS, ROUTINE W REFLEX MICROSCOPIC
Bilirubin Urine: NEGATIVE
Glucose, UA: NEGATIVE mg/dL
Ketones, ur: NEGATIVE mg/dL
Nitrite: POSITIVE — AB
Protein, ur: NEGATIVE mg/dL
Specific Gravity, Urine: 1.004 — ABNORMAL LOW (ref 1.005–1.030)
WBC, UA: >50 WBC/hpf — ABNORMAL HIGH (ref 0–5)
pH: 6 (ref 5.0–8.0)

## 2021-03-14 LAB — COMPREHENSIVE METABOLIC PANEL
ALT: 14 U/L (ref 0–44)
AST: 18 U/L (ref 15–41)
Albumin: 3.7 g/dL (ref 3.5–5.0)
Alkaline Phosphatase: 101 U/L (ref 38–126)
Anion gap: 12 (ref 5–15)
BUN: 31 mg/dL — ABNORMAL HIGH (ref 8–23)
CO2: 28 mmol/L (ref 22–32)
Calcium: 9.1 mg/dL (ref 8.9–10.3)
Chloride: 94 mmol/L — ABNORMAL LOW (ref 98–111)
Creatinine, Ser: 2.13 mg/dL — ABNORMAL HIGH (ref 0.44–1.00)
GFR, Estimated: 25 mL/min — ABNORMAL LOW (ref >60)
Glucose, Bld: 86 mg/dL (ref 70–99)
Potassium: 4.4 mmol/L (ref 3.5–5.1)
Sodium: 134 mmol/L — ABNORMAL LOW (ref 135–145)
Total Bilirubin: 1 mg/dL (ref 0.3–1.2)
Total Protein: 8.2 g/dL — ABNORMAL HIGH (ref 6.5–8.1)

## 2021-03-14 LAB — CBC WITH DIFFERENTIAL/PLATELET
Abs Immature Granulocytes: 0.03 10*3/uL (ref 0.00–0.07)
Basophils Absolute: 0.1 10*3/uL (ref 0.0–0.1)
Basophils Relative: 1 %
Eosinophils Absolute: 0.1 10*3/uL (ref 0.0–0.5)
Eosinophils Relative: 1 %
HCT: 36.3 % (ref 36.0–46.0)
Hemoglobin: 11.9 g/dL — ABNORMAL LOW (ref 12.0–15.0)
Immature Granulocytes: 0 %
Lymphocytes Relative: 10 %
Lymphs Abs: 1.3 10*3/uL (ref 0.7–4.0)
MCH: 29.1 pg (ref 26.0–34.0)
MCHC: 32.8 g/dL (ref 30.0–36.0)
MCV: 88.8 fL (ref 80.0–100.0)
Monocytes Absolute: 1.1 10*3/uL — ABNORMAL HIGH (ref 0.1–1.0)
Monocytes Relative: 8 %
Neutro Abs: 10.5 10*3/uL — ABNORMAL HIGH (ref 1.7–7.7)
Neutrophils Relative %: 80 %
Platelets: 468 10*3/uL — ABNORMAL HIGH (ref 150–400)
RBC: 4.09 MIL/uL (ref 3.87–5.11)
RDW: 13.9 % (ref 11.5–15.5)
WBC: 13 10*3/uL — ABNORMAL HIGH (ref 4.0–10.5)
nRBC: 0 % (ref 0.0–0.2)

## 2021-03-14 LAB — BASIC METABOLIC PANEL
Anion gap: 10 (ref 5–15)
BUN: 27 mg/dL — ABNORMAL HIGH (ref 8–23)
CO2: 23 mmol/L (ref 22–32)
Calcium: 8.1 mg/dL — ABNORMAL LOW (ref 8.9–10.3)
Chloride: 102 mmol/L (ref 98–111)
Creatinine, Ser: 1.87 mg/dL — ABNORMAL HIGH (ref 0.44–1.00)
GFR, Estimated: 29 mL/min — ABNORMAL LOW (ref >60)
Glucose, Bld: 95 mg/dL (ref 70–99)
Potassium: 3.8 mmol/L (ref 3.5–5.1)
Sodium: 135 mmol/L (ref 135–145)

## 2021-03-14 LAB — LIPASE, BLOOD: Lipase: 46 U/L (ref 11–51)

## 2021-03-14 LAB — RESP PANEL BY RT-PCR (FLU A&B, COVID) ARPGX2
Influenza A by PCR: NEGATIVE
Influenza B by PCR: NEGATIVE
SARS Coronavirus 2 by RT PCR: NEGATIVE

## 2021-03-14 LAB — CBC
HCT: 33.2 % — ABNORMAL LOW (ref 36.0–46.0)
Hemoglobin: 10.4 g/dL — ABNORMAL LOW (ref 12.0–15.0)
MCH: 29.1 pg (ref 26.0–34.0)
MCHC: 31.3 g/dL (ref 30.0–36.0)
MCV: 92.7 fL (ref 80.0–100.0)
Platelets: 367 10*3/uL (ref 150–400)
RBC: 3.58 MIL/uL — ABNORMAL LOW (ref 3.87–5.11)
RDW: 14.1 % (ref 11.5–15.5)
WBC: 11.6 10*3/uL — ABNORMAL HIGH (ref 4.0–10.5)
nRBC: 0 % (ref 0.0–0.2)

## 2021-03-14 LAB — LACTIC ACID, PLASMA: Lactic Acid, Venous: 1.1 mmol/L (ref 0.5–1.9)

## 2021-03-14 MED ORDER — LACTATED RINGERS IV SOLN: INTRAVENOUS | Status: DC

## 2021-03-14 MED ORDER — ARIPIPRAZOLE 10 MG PO TABS
30.0000 mg | ORAL_TABLET | Freq: Every day | ORAL | Status: DC
  Administered 2021-03-14 – 2021-03-25 (×12): 30 mg via ORAL
  Filled 2021-03-14 (×13): qty 3

## 2021-03-14 MED ORDER — FENTANYL CITRATE PF 50 MCG/ML IJ SOSY: 25.0000 ug | PREFILLED_SYRINGE | INTRAMUSCULAR | Status: DC | PRN

## 2021-03-14 MED ORDER — BUSPIRONE HCL 5 MG PO TABS
7.5000 mg | ORAL_TABLET | Freq: Two times a day (BID) | ORAL | Status: DC
  Administered 2021-03-14 – 2021-03-25 (×23): 7.5 mg via ORAL
  Filled 2021-03-14 (×17): qty 2
  Filled 2021-03-14: qty 1.5
  Filled 2021-03-14 (×6): qty 2

## 2021-03-14 MED ORDER — METHOCARBAMOL 500 MG PO TABS
500.0000 mg | ORAL_TABLET | Freq: Three times a day (TID) | ORAL | Status: DC | PRN
  Administered 2021-03-15 (×2): 500 mg via ORAL
  Filled 2021-03-14 (×3): qty 1

## 2021-03-14 MED ORDER — MORPHINE SULFATE (PF) 4 MG/ML IV SOLN
4.0000 mg | Freq: Once | INTRAVENOUS | Status: AC
  Administered 2021-03-14: 4 mg via INTRAVENOUS
  Filled 2021-03-14: qty 1

## 2021-03-14 MED ORDER — ACETAMINOPHEN 650 MG RE SUPP: 650.0000 mg | Freq: Four times a day (QID) | RECTAL | Status: DC | PRN

## 2021-03-14 MED ORDER — POLYETHYLENE GLYCOL 3350 17 G PO PACK
17.0000 g | PACK | Freq: Two times a day (BID) | ORAL | Status: DC
  Administered 2021-03-14 – 2021-03-15 (×3): 17 g via ORAL
  Filled 2021-03-14 (×12): qty 1

## 2021-03-14 MED ORDER — PANTOPRAZOLE SODIUM 40 MG PO TBEC
40.0000 mg | DELAYED_RELEASE_TABLET | Freq: Every day | ORAL | Status: DC
  Administered 2021-03-14 – 2021-03-24 (×11): 40 mg via ORAL
  Filled 2021-03-14 (×11): qty 1

## 2021-03-14 MED ORDER — ONDANSETRON HCL 4 MG/2ML IJ SOLN
4.0000 mg | Freq: Four times a day (QID) | INTRAMUSCULAR | Status: DC | PRN
  Administered 2021-03-14 – 2021-03-20 (×2): 4 mg via INTRAVENOUS
  Filled 2021-03-14 (×2): qty 2

## 2021-03-14 MED ORDER — ONDANSETRON HCL 4 MG PO TABS: 4.0000 mg | ORAL_TABLET | Freq: Four times a day (QID) | ORAL | Status: DC | PRN

## 2021-03-14 MED ORDER — ONDANSETRON HCL 4 MG/2ML IJ SOLN
4.0000 mg | Freq: Once | INTRAMUSCULAR | Status: AC
  Administered 2021-03-14: 4 mg via INTRAVENOUS
  Filled 2021-03-14: qty 2

## 2021-03-14 MED ORDER — PIPERACILLIN-TAZOBACTAM 3.375 G IVPB 30 MIN
3.3750 g | Freq: Once | INTRAVENOUS | Status: AC
Start: 2021-03-14 — End: 2021-03-14
  Administered 2021-03-14: 3.375 g via INTRAVENOUS
  Filled 2021-03-14: qty 50

## 2021-03-14 MED ORDER — HYDROCODONE-ACETAMINOPHEN 5-325 MG PO TABS: 1.0000 | ORAL_TABLET | ORAL | Status: DC | PRN

## 2021-03-14 MED ORDER — ASPIRIN EC 81 MG PO TBEC
81.0000 mg | DELAYED_RELEASE_TABLET | Freq: Every day | ORAL | Status: DC
  Administered 2021-03-14 – 2021-03-25 (×12): 81 mg via ORAL
  Filled 2021-03-14 (×12): qty 1

## 2021-03-14 MED ORDER — SODIUM CHLORIDE 0.9 % IV SOLN
1.0000 g | Freq: Two times a day (BID) | INTRAVENOUS | Status: DC
  Administered 2021-03-14 (×2): 1 g via INTRAVENOUS
  Filled 2021-03-14: qty 1
  Filled 2021-03-14: qty 20

## 2021-03-14 MED ORDER — DULOXETINE HCL 30 MG PO CPEP
30.0000 mg | ORAL_CAPSULE | Freq: Every day | ORAL | Status: DC
  Administered 2021-03-14 – 2021-03-24 (×11): 30 mg via ORAL
  Filled 2021-03-14 (×11): qty 1

## 2021-03-14 MED ORDER — HYDROMORPHONE HCL 1 MG/ML IJ SOLN
0.5000 mg | INTRAMUSCULAR | Status: DC | PRN
  Administered 2021-03-14 – 2021-03-19 (×14): 0.5 mg via INTRAVENOUS
  Filled 2021-03-14 (×16): qty 0.5

## 2021-03-14 MED ORDER — ATORVASTATIN CALCIUM 10 MG PO TABS
10.0000 mg | ORAL_TABLET | Freq: Every day | ORAL | Status: DC
Start: 2021-03-14 — End: 2021-03-25
  Administered 2021-03-14 – 2021-03-24 (×11): 10 mg via ORAL
  Filled 2021-03-14 (×11): qty 1

## 2021-03-14 MED ORDER — ALBUTEROL SULFATE (2.5 MG/3ML) 0.083% IN NEBU: 3.0000 mL | INHALATION_SOLUTION | Freq: Four times a day (QID) | RESPIRATORY_TRACT | Status: DC | PRN

## 2021-03-14 MED ORDER — SODIUM CHLORIDE 0.9 % IV SOLN
500.0000 mg | Freq: Two times a day (BID) | INTRAVENOUS | Status: DC
  Administered 2021-03-15 – 2021-03-18 (×7): 500 mg via INTRAVENOUS
  Filled 2021-03-14: qty 500
  Filled 2021-03-14: qty 10
  Filled 2021-03-14: qty 500
  Filled 2021-03-14: qty 10
  Filled 2021-03-14: qty 500
  Filled 2021-03-14: qty 10
  Filled 2021-03-14 (×2): qty 500

## 2021-03-14 MED ORDER — ACETAMINOPHEN 325 MG PO TABS
650.0000 mg | ORAL_TABLET | Freq: Four times a day (QID) | ORAL | Status: DC | PRN
Start: 2021-03-14 — End: 2021-03-25
  Administered 2021-03-14: 650 mg via ORAL
  Filled 2021-03-14: qty 2

## 2021-03-14 MED ORDER — HEPARIN SODIUM (PORCINE) 5000 UNIT/ML IJ SOLN
5000.0000 [IU] | Freq: Three times a day (TID) | INTRAMUSCULAR | Status: DC
  Administered 2021-03-14 – 2021-03-18 (×13): 5000 [IU] via SUBCUTANEOUS
  Filled 2021-03-14 (×13): qty 1

## 2021-03-14 NOTE — H&P (Addendum)
History and Physical    Fia Hebert EHU:[id number] DOB: 1955/01/18 DOA: 03/13/2021  PCP: [name], MD   Patient coming from: Home   Chief Complaint: Lower abdominal pain, fever   HPI: Shelia Chan is a pleasant 66 y.o. female with medical history significant for COPD, chronic diastolic CHF, depression, anxiety, infections with MDR organisms, urethral cancer status post radical cystectomy, urethrectomy, and ileal conduit presents with lower abdominal pain and fevers despite taking Keflex for UTI.  Patient reports that she developed lower abdominal pain approximately a week ago, felt as though she was developing a recurrent UTI, was started on Keflex, but has continued to have worsening discomfort and reports fevers at home for the past 2 days for which she is taking Tylenol.  She had Pseudomonas in her urine in October, and had ESBL and VRE in her urine in July.  She follows with oncology and is under observation for her history of cancer and has bilateral ureteral stents followed by urology. She reports chronic bilateral leg swelling that she attributes to prior surgery with lymph node dissection.   ED Course: Upon arrival to the ED, patient is found to be afebrile, saturating well on room air, and with systolic blood pressure 785.  Urinalysis is notable for many bacteria, large leukocytes, positive nitrites, moderate hemoglobin, and >50 WBC/hpf.  Chemistry panel notable for slight hyponatremia and creatinine 2.13.  CBC with leukocytosis to 13,000.  Lactic acid is normal.  Blood and urine cultures were collected in the ED and the patient was treated with morphine, 2 L of saline, and Zosyn.  Review of Systems:  All other systems reviewed and apart from HPI, are negative.  Past Medical History:  Diagnosis Date   Arthritis    Asthma    Blood transfusion without reported diagnosis    Cancer ([place])    CHF (congestive heart failure) ([facility])    COPD (chronic obstructive pulmonary disease)  ([place])    Heart murmur    Immune deficiency disorder ([place])    Renal disorder     Past Surgical History:  Procedure Laterality Date   BLADDER REMOVAL  118/22/2015   FRACTURE SURGERY Left 02/2018   pins   IR CATHETER TUBE CHANGE  04/15/2019   IR CATHETER TUBE CHANGE  04/15/2019   IR EXT NEPHROURETERAL CATH EXCHANGE  07/15/2019   IR EXT NEPHROURETERAL CATH EXCHANGE  10/14/2019   IR EXT NEPHROURETERAL CATH EXCHANGE  10/14/2019   IR EXT NEPHROURETERAL CATH EXCHANGE  01/06/2020   IR EXT NEPHROURETERAL CATH EXCHANGE  03/16/2020   IR EXT NEPHROURETERAL CATH EXCHANGE  05/25/2020   IR EXT NEPHROURETERAL CATH EXCHANGE  07/17/2020   IR EXT NEPHROURETERAL CATH EXCHANGE  07/17/2020   IR EXT NEPHROURETERAL CATH EXCHANGE  08/14/2020   IR EXT NEPHROURETERAL CATH EXCHANGE  09/25/2020   IR EXT NEPHROURETERAL CATH EXCHANGE  11/06/2020   IR EXT NEPHROURETERAL CATH EXCHANGE  12/25/2020   IR EXT NEPHROURETERAL CATH EXCHANGE  02/09/2021    Social History:   reports that she has been smoking cigarettes and e-cigarettes. She has a 50.00 pack-year smoking history. She has never used smokeless tobacco. She reports that she does not currently use alcohol. She reports current drug use. Drug: Marijuana.  Allergies  Allergen Reactions   Metoprolol Shortness Of Breath    Family History  Problem Relation Age of Onset   Heart disease Maternal Grandfather      Prior to Admission medications   Medication Sig Start Date End Date  Taking? Authorizing Provider  acetaminophen (TYLENOL) 500 MG tablet Take 500-1,000 mg by mouth every 6 (six) hours as needed for moderate pain or fever.   Yes [provider]  albuterol (VENTOLIN HFA) 108 (90 Base) MCG/ACT inhaler Inhale 1 puff into the lungs every 6 (six) hours as needed for shortness of breath. 05/22/20  Yes [provider]  ARIPiprazole (ABILIFY) 30 MG tablet Take 30 mg by mouth daily. 03/19/19  Yes [provider]  aspirin EC 81 MG tablet Take 81 mg by  mouth daily.   Yes [provider]  atorvastatin (LIPITOR) 10 MG tablet Take 10 mg by mouth at bedtime. 02/07/21  Yes [provider]  B Complex-C (B-COMPLEX WITH VITAMIN C) tablet Take 1 tablet by mouth daily.   Yes [provider]  busPIRone (BUSPAR) 7.5 MG tablet Take 7.5 mg by mouth 2 (two) times daily. 02/22/21  Yes [provider]  calcitRIOL (ROCALTROL) 0.25 MCG capsule Take 0.25 mcg by mouth daily. 02/12/21  Yes [provider]  Cholecalciferol (VITAMIN D-3) 25 MCG (1000 UT) CAPS Take 2,000 Units by mouth daily.   Yes [provider]  DULoxetine (CYMBALTA) 30 MG capsule Take 30 mg by mouth at bedtime. 06/21/20  Yes [provider]  ferrous sulfate 324 MG TBEC Take 324 mg by mouth daily with breakfast.   Yes [provider]  furosemide (LASIX) 40 MG tablet Take 40 mg by mouth daily.   Yes [provider]  KLOR-CON M20 20 MEQ tablet TAKE 1 TABLET BY MOUTH EVERY DAY Patient taking differently: Take 20 mEq by mouth daily. 10/28/20  Yes O'Neal, Cassie Freer, MD  methocarbamol (ROBAXIN) 500 MG tablet Take 500 mg by mouth 3 (three) times daily as needed for muscle spasms. 03/08/21  Yes [provider]  Multiple Vitamin (MULTIVITAMIN ADULT PO) Take 1 tablet by mouth daily.   Yes [provider]  pantoprazole (PROTONIX) 40 MG tablet Take 40 mg by mouth daily. 01/28/21  Yes [provider]  Probiotic Product (PROBIOTIC PO) Take 1 capsule by mouth daily.   Yes [provider]  vitamin C (ASCORBIC ACID) 500 MG tablet Take 500 mg by mouth daily.   Yes [provider]    Physical Exam: Vitals:   03/14/21 0100 03/14/21 0240 03/14/21 0300 03/14/21 0330  BP: 112/64 109/75 127/73 119/68  Pulse: 66 73    Resp: (!) 22 (!) 21 17 17   Temp:      TempSrc:      SpO2: 98% 99% 99% 100%    Constitutional: NAD, calm  Eyes: PERTLA, lids and conjunctivae normal ENMT: Mucous membranes are  moist. Posterior pharynx clear of any exudate or lesions.   Neck: supple, no masses  Respiratory: no wheezing, no crackles. No accessory muscle use.  Cardiovascular: S1 & S2 heard, regular rate and rhythm. No significant JVD. Abdomen: No distension, soft. Bowel sounds active.  Musculoskeletal: no clubbing / cyanosis. No joint deformity upper and lower extremities.   Skin: no significant rashes, lesions, ulcers. Warm, dry, well-perfused. Poor turgor.  Neurologic: CN 2-12 grossly intact. Moving all extremities. Alert and oriented.  Psychiatric: Very pleasant. Cooperative.    Labs and Imaging on Admission: I have personally reviewed following labs and imaging studies  CBC: Recent Labs  Lab 03/13/21 2323  WBC 13.0*  NEUTROABS 10.5*  HGB 11.9*  HCT 36.3  MCV 88.8  PLT 326*   Basic Metabolic Panel: Recent Labs  Lab 03/13/21 2323  NA 134*  K 4.4  CL 94*  CO2 28  GLUCOSE 86  BUN 31*  CREATININE 2.13*  CALCIUM 9.1   GFR: CrCl cannot be calculated (Unknown ideal weight.). Liver Function Tests: Recent Labs  Lab 03/13/21 2323  AST 18  ALT 14  ALKPHOS 101  BILITOT 1.0  PROT 8.2*  ALBUMIN 3.7   Recent Labs  Lab 03/13/21 2323  LIPASE 46   No results for input(s): AMMONIA in the last 168 hours. Coagulation Profile: No results for input(s): INR, PROTIME in the last 168 hours. Cardiac Enzymes: No results for input(s): CKTOTAL, CKMB, CKMBINDEX, TROPONINI in the last 168 hours. BNP (last 3 results) No results for input(s): PROBNP in the last 8760 hours. HbA1C: No results for input(s): HGBA1C in the last 72 hours. CBG: No results for input(s): GLUCAP in the last 168 hours. Lipid Profile: No results for input(s): CHOL, HDL, LDLCALC, TRIG, CHOLHDL, LDLDIRECT in the last 72 hours. Thyroid Function Tests: No results for input(s): TSH, T4TOTAL, FREET4, T3FREE, THYROIDAB in the last 72 hours. Anemia Panel: No results for input(s): VITAMINB12, FOLATE, FERRITIN, TIBC, IRON,  RETICCTPCT in the last 72 hours. Urine analysis:    Component Value Date/Time   COLORURINE STRAW (A) 03/14/2021 0209   APPEARANCEUR HAZY (A) 03/14/2021 0209   LABSPEC 1.004 (L) 03/14/2021 0209   PHURINE 6.0 03/14/2021 0209   GLUCOSEU NEGATIVE 03/14/2021 0209   HGBUR MODERATE (A) 03/14/2021 0209   BILIRUBINUR NEGATIVE 03/14/2021 0209   KETONESUR NEGATIVE 03/14/2021 0209   PROTEINUR NEGATIVE 03/14/2021 0209   NITRITE POSITIVE (A) 03/14/2021 0209   LEUKOCYTESUR LARGE (A) 03/14/2021 0209   Sepsis Labs: @LABRCNTIP (procalcitonin:4,lacticidven:4) ) Recent Results (from the past 240 hour(s))  Resp Panel by RT-PCR (Flu A&B, Covid) Nasopharyngeal Swab     Status: None   Collection Time: 03/14/21  2:09 AM   Specimen: Nasopharyngeal Swab; Nasopharyngeal(NP) swabs in vial transport medium  Result Value Ref Range Status   SARS Coronavirus 2 by RT PCR NEGATIVE NEGATIVE Final    Comment: (NOTE) SARS-CoV-2 target nucleic acids are NOT DETECTED.  The SARS-CoV-2 RNA is generally detectable in upper respiratory specimens during the acute phase of infection. The lowest concentration of SARS-CoV-2 viral copies this assay can detect is 138 copies/mL. A negative result does not preclude SARS-Cov-2 infection and should not be used as the sole basis for treatment or other patient management decisions. A negative result may occur with  improper specimen collection/handling, submission of specimen other than nasopharyngeal swab, presence of viral mutation(s) within the areas targeted by this assay, and inadequate number of viral copies(<138 copies/mL). A negative result must be combined with clinical observations, patient history, and epidemiological information. The expected result is Negative.  Fact Sheet for Patients:  EntrepreneurPulse.com.au  Fact Sheet for Healthcare Providers:  IncredibleEmployment.be  This test is no t yet approved or cleared by the  [place] FDA and  has been authorized for detection and/or diagnosis of SARS-CoV-2 by FDA under an Emergency Use Authorization (EUA). This EUA will remain  in effect (meaning this test can be used) for the duration of the COVID-19 declaration under Section 564(b)(1) of the Act, 21 U.S.C.section 360bbb-3(b)(1), unless the authorization is terminated  or revoked sooner.       Influenza A by PCR NEGATIVE NEGATIVE Final   Influenza B by PCR NEGATIVE NEGATIVE Final    Comment: (NOTE) The Xpert Xpress SARS-CoV-2/FLU/RSV plus assay is intended as an aid in the diagnosis of influenza from Nasopharyngeal swab specimens and should not  be used as a sole basis for treatment. Nasal washings and aspirates are unacceptable for Xpert Xpress SARS-CoV-2/FLU/RSV testing.  Fact Sheet for Patients: EntrepreneurPulse.com.au  Fact Sheet for Healthcare Providers: IncredibleEmployment.be  This test is not yet approved or cleared by the [place] FDA and has been authorized for detection and/or diagnosis of SARS-CoV-2 by FDA under an Emergency Use Authorization (EUA). This EUA will remain in effect (meaning this test can be used) for the duration of the COVID-19 declaration under Section 564(b)(1) of the Act, 21 U.S.C. section 360bbb-3(b)(1), unless the authorization is terminated or revoked.  Performed at [facility], [place] [address], [place], [place] [address]      Radiological Exams on Admission: CT ABDOMEN PELVIS WO CONTRAST  Result Date: 03/14/2021 CLINICAL DATA:  Lower abdominal pain EXAM: CT ABDOMEN AND PELVIS WITHOUT CONTRAST TECHNIQUE: Multidetector CT imaging of the abdomen and pelvis was performed following the standard protocol without IV contrast. RADIATION DOSE REDUCTION: This exam was performed according to the departmental dose-optimization program which includes automated exposure control, adjustment of the mA and/or kV  according to patient size and/or use of iterative reconstruction technique. COMPARISON:  11/08/2020 FINDINGS: Lower chest: No acute abnormality. Hepatobiliary: No focal hepatic abnormality. Gallbladder unremarkable. Pancreas: No focal abnormality or ductal dilatation. Spleen: No focal abnormality.  Normal size. Adrenals/Urinary Tract: Prior cystectomy with right lower quadrant urostomy. Ureteral stents extend through the urostomy and into the renal pelvis bilaterally. Left renal atrophy. No hydronephrosis. Adrenal glands unremarkable. Stomach/Bowel: No bowel obstruction. Stomach, large and small bowel grossly unremarkable. Moderate stool burden throughout the colon. Vascular/Lymphatic: Aortic atherosclerosis. No evidence of aneurysm or adenopathy. Reproductive: Prior hysterectomy.  No adnexal masses. Other: No free fluid or free air. Musculoskeletal: No acute bony abnormality. IMPRESSION: Changes of cystectomy with right lower quadrant urostomy. Ureteral stents remain in place. No hydronephrosis. Moderate stool burden in the colon. No acute findings. Aortic atherosclerosis. Electronically Signed   By: Rolm Baptise M.D.   On: 03/14/2021 01:31     Assessment/Plan  1. UTI  - Pt with hx of urethral cancer s/p cystectomy, urethrectomy, and ileal conduit, and hx of ESBL, pseudomonas, and VRE in urine presents with fever and lower abdominal pain despite Keflex  - She has leukocytosis, no fever in ED, no acute findings on CT  - Blood and urine cultures were collected in ED and IV antibiotics started  - Continue broad-spectrum antibiotics, follow cultures and clinical course   2. CKD IV  - SCr is 2.13, up from 1.74 in October 2022  - Renally-dose medications, monitor   3. COPD  - Stable, not in exacerbation  - Continue as-needed albuterol    4. Chronic diastolic CHF  - Appears hypovolemic on admission and given 2 liters NS in ED  - Hold Lasix, monitor volume status   5. Depression, anxiety  - Continue  Abilify, Buspar, Cymbalta    DVT prophylaxis: sq heparin  Code Status: DNR, discussed with patient in ED  Level of Care: Level of care: Med-Surg Family Communication: None present  Disposition Plan:  Patient is from: home  Anticipated d/c is to: home  Anticipated d/c date is: 2/29/23 Patient currently: pending improvement infection, cultures Consults called: None  Admission status: Inpatient     Vianne Bulls, MD [facility]  03/14/2021, 3:56 AM

## 2021-03-14 NOTE — Assessment & Plan Note (Addendum)
-  Continue home Abilify, Buspar, Cymbalta

## 2021-03-14 NOTE — Assessment & Plan Note (Addendum)
Stable. No exacerbation at this time. -Continue albuterol PRN

## 2021-03-14 NOTE — Assessment & Plan Note (Addendum)
Baseline creatinine is about 1.6-1.8. Stable.

## 2021-03-14 NOTE — Progress Notes (Signed)
Pharmacy Antibiotic Note  Shelia Chan is a 66 y.o. female admitted on 03/13/2021 with lower abdominal discomfort.  Recently ddiagnosed with UTI and just about finished her abx.  Pharmacy has been consulted for merrem  Plan: Merrem 1gm IV q12h Follow renal function, cultures and clinical course     Temp (24hrs), Avg:99.1 F (37.3 C), Min:99.1 F (37.3 C), Max:99.1 F (37.3 C)  Recent Labs  Lab 03/13/21 2323 03/14/21 0000  WBC 13.0*  --   CREATININE 2.13*  --   LATICACIDVEN  --  1.1    CrCl cannot be calculated (Unknown ideal weight.).    Allergies  Allergen Reactions   Metoprolol Shortness Of Breath     Thank you for allowing pharmacy to be a part of this patients care.  Dolly Rias RPh 03/14/2021, 3:46 AM

## 2021-03-14 NOTE — Assessment & Plan Note (Addendum)
Euvolemic on admission.  Lasix held on admission.  Transthoracic Echocardiogram stable. Patient reports diarrhea therefore we will be holding Lasix for now.

## 2021-03-14 NOTE — Hospital Course (Addendum)
66 yo with hx urethral cancer s/p radical cystectomy, urethrectomy, and ileal conduit, COPD, HFpEF, depression/anxiety who presents with fevers and abdominal pain concerning for UTI.  Notably with history of pseudomonas and ESBL in the past. 2/26 admitted with fever and lower abdominal pain 2/28 MRI pelvis concerning for osteomyelitis, myositis as well as possible right femoral neck osteo.  MRI with and without contrast [facility] protocol performed 3/1 ID consulted cultures growing E. coli MDRO and Pseudomonas.  Orthopedic Dr. Ninfa Linden consulted for stress fracture of right hip.  Trochanter.  Recommend no intervention and weightbearing as tolerated. 3/2 echocardiogram EF 65 to 70%, no vegetation. 3/3 bone biopsy performed due to history of urethral cancer. 3/8 IR consulted for PICC line placement due to history of CKD.  Medically stable.

## 2021-03-14 NOTE — Progress Notes (Signed)
PHARMACY NOTE:  ANTIMICROBIAL RENAL DOSAGE ADJUSTMENT  Current antimicrobial regimen includes a mismatch between antimicrobial dosage and estimated renal function.  As per policy approved by the Pharmacy & Therapeutics and Medical Executive Committees, the antimicrobial dosage will be adjusted accordingly.  Current antimicrobial dosage:  Merrem 1g IV q12h  Indication: UTI  Renal Function: Estimated Creatinine Clearance: 24.2 mL/min (A) (by C-G formula based on SCr of 1.87 mg/dL (H)).    Antimicrobial dosage has been changed to:  Merrem 500 mg IV q12h  Gretta Arab PharmD, BCPS Clinical Pharmacist [facility] main pharmacy [phone] 03/14/2021 3:19 PM

## 2021-03-14 NOTE — Progress Notes (Signed)
PROGRESS NOTE    Shelia Chan  ZOX:[id number] DOB: 01-07-1956 DOA: 03/13/2021 PCP: Ellyn Hack, MD  Chief Complaint  Patient presents with   Abdominal Pain    Brief Narrative:  66 yo with hx urethral cancer s/p radical cystectomy, urethrectomy, and ileal conduit, COPD, HFpEF, depression/anxiety who presents with fevers and abdominal pain concerning for UTI.  Notably with history of pseudomonas and ESBL in the past.      Assessment & Plan:   Principal Problem:   UTI (urinary tract infection) Active Problems:   Vaginal discharge   CKD (chronic kidney disease), stage IV (HCC)   Chronic diastolic CHF (congestive heart failure) (HCC)   COPD (chronic obstructive pulmonary disease) (HCC)   Depression with anxiety   Assessment and Plan: * UTI (urinary tract infection)- (present on admission) Hx urethral cancer s/p radical cystectomy, urethrectomy, and ileal conduit Has suprapubic discomfort (discussed with urology who noted can have referred pain) UA with positive nitrite, LE, many bacteria, >50 WBC Follow urine culture and blood cultures Currently on meropenem given hx pseudomonas 10/2020 and ESBL 07/2020  Vaginal discharge Wet prep  CKD (chronic kidney disease), stage IV (HCC)- (present on admission) Creatinine appears around baseline  Chronic diastolic CHF (congestive heart failure) (HCC)- (present on admission) Appears euvolemic at this time, will continue to monitor, hold lasix  COPD (chronic obstructive pulmonary disease) (HCC)- (present on admission) No evidence exacberation  Depression with anxiety- (present on admission) Abilify, buspar, cymbalta   DVT prophylaxis: heparin Code Status: dnr Family Communication: none Disposition:   Status is: Inpatient  Remains inpatient appropriate because: need for IV abx   Consultants:  none  Procedures:  none  Antimicrobials:  Anti-infectives (From admission, onward)    Start     Dose/Rate Route Frequency  Ordered Stop   03/14/21 0400  meropenem (MERREM) 1 g in sodium chloride 0.9 % 100 mL IVPB        1 g 200 mL/hr over 30 Minutes Intravenous Every 12 hours 03/14/21 0343     03/14/21 0245  piperacillin-tazobactam (ZOSYN) IVPB 3.375 g        3.375 g 100 mL/hr over 30 Minutes Intravenous  Once 03/14/21 0233 03/14/21 0330       Subjective: No new complaints She does note vaginal discharge recently as well  Objective: Vitals:   03/14/21 0300 03/14/21 0330 03/14/21 0419 03/14/21 1026  BP: 127/73 119/68 116/65 102/64  Pulse:   66 72  Resp: 17 17 16 17   Temp:   98.5 F (36.9 C) 98.5 F (36.9 C)  TempSrc:   Oral Oral  SpO2: 99% 100% 100% 97%    Intake/Output Summary (Last 24 hours) at 03/14/2021 1052 Last data filed at 03/14/2021 1032 Gross per 24 hour  Intake 2530.4 ml  Output 1400 ml  Net 1130.4 ml   There were no vitals filed for this visit.  Examination:  General exam: Appears calm and comfortable  Respiratory system: unlabored Cardiovascular system: RRR Gastrointestinal system: suprapubic ttp, ileal conduit with clear yellow urine GU exam with nursing present given clear vaginal discharge - external exam with mild discharge noted, labia appear swollen, but no TTP Central nervous system: Alert and oriented. No focal neurological deficits. Extremities: no LEE Skin: No rashes, lesions or ulcers Psychiatry: Judgement and insight appear normal. Mood & affect appropriate.     Data Reviewed: I have personally reviewed following labs and imaging studies  CBC: Recent Labs  Lab 03/13/21 2323 03/14/21 0439  WBC 13.0*  11.6*  NEUTROABS 10.5*  --   HGB 11.9* 10.4*  HCT 36.3 33.2*  MCV 88.8 92.7  PLT 468* 367    Basic Metabolic Panel: Recent Labs  Lab 03/13/21 2323 03/14/21 0439  NA 134* 135  K 4.4 3.8  CL 94* 102  CO2 28 23  GLUCOSE 86 95  BUN 31* 27*  CREATININE 2.13* 1.87*  CALCIUM 9.1 8.1*    GFR: CrCl cannot be calculated (Unknown ideal  weight.).  Liver Function Tests: Recent Labs  Lab 03/13/21 2323  AST 18  ALT 14  ALKPHOS 101  BILITOT 1.0  PROT 8.2*  ALBUMIN 3.7    CBG: No results for input(s): GLUCAP in the last 168 hours.   Recent Results (from the past 240 hour(s))  Resp Panel by RT-PCR (Flu Marcquis Ridlon&B, Covid) Nasopharyngeal Swab     Status: None   Collection Time: 03/14/21  2:09 AM   Specimen: Nasopharyngeal Swab; Nasopharyngeal(NP) swabs in vial transport medium  Result Value Ref Range Status   SARS Coronavirus 2 by RT PCR NEGATIVE NEGATIVE Final    Comment: (NOTE) SARS-CoV-2 target nucleic acids are NOT DETECTED.  The SARS-CoV-2 RNA is generally detectable in upper respiratory specimens during the acute phase of infection. The lowest concentration of SARS-CoV-2 viral copies this assay can detect is 138 copies/mL. Amahd Morino negative result does not preclude SARS-Cov-2 infection and should not be used as the sole basis for treatment or other patient management decisions. Dustie Brittle negative result may occur with  improper specimen collection/handling, submission of specimen other than nasopharyngeal swab, presence of viral mutation(s) within the areas targeted by this assay, and inadequate number of viral copies(<138 copies/mL). Toshie Demelo negative result must be combined with clinical observations, patient history, and epidemiological information. The expected result is Negative.  Fact Sheet for Patients:  BloggerCourse.com  Fact Sheet for Healthcare Providers:  SeriousBroker.it  This test is no t yet approved or cleared by the [place] FDA and  has been authorized for detection and/or diagnosis of SARS-CoV-2 by FDA under an Emergency Use Authorization (EUA). This EUA will remain  in effect (meaning this test can be used) for the duration of the COVID-19 declaration under Section 564(b)(1) of the Act, 21 U.S.C.section 360bbb-3(b)(1), unless the authorization is  terminated  or revoked sooner.       Influenza Geanine Vandekamp by PCR NEGATIVE NEGATIVE Final   Influenza B by PCR NEGATIVE NEGATIVE Final    Comment: (NOTE) The Xpert Xpress SARS-CoV-2/FLU/RSV plus assay is intended as an aid in the diagnosis of influenza from Nasopharyngeal swab specimens and should not be used as Josiyah Tozzi sole basis for treatment. Nasal washings and aspirates are unacceptable for Xpert Xpress SARS-CoV-2/FLU/RSV testing.  Fact Sheet for Patients: BloggerCourse.com  Fact Sheet for Healthcare Providers: SeriousBroker.it  This test is not yet approved or cleared by the [place] FDA and has been authorized for detection and/or diagnosis of SARS-CoV-2 by FDA under an Emergency Use Authorization (EUA). This EUA will remain in effect (meaning this test can be used) for the duration of the COVID-19 declaration under Section 564(b)(1) of the Act, 21 U.S.C. section 360bbb-3(b)(1), unless the authorization is terminated or revoked.  Performed at [facility], [address], [place], [place] [address]          Radiology Studies: CT ABDOMEN PELVIS WO CONTRAST  Result Date: 03/14/2021 CLINICAL DATA:  Lower abdominal pain EXAM: CT ABDOMEN AND PELVIS WITHOUT CONTRAST TECHNIQUE: Multidetector CT imaging of the abdomen and pelvis was  performed following the standard protocol without IV contrast. RADIATION DOSE REDUCTION: This exam was performed according to the departmental dose-optimization program which includes automated exposure control, adjustment of the mA and/or kV according to patient size and/or use of iterative reconstruction technique. COMPARISON:  11/08/2020 FINDINGS: Lower chest: No acute abnormality. Hepatobiliary: No focal hepatic abnormality. Gallbladder unremarkable. Pancreas: No focal abnormality or ductal dilatation. Spleen: No focal abnormality.  Normal size. Adrenals/Urinary Tract: Prior cystectomy with  right lower quadrant urostomy. Ureteral stents extend through the urostomy and into the renal pelvis bilaterally. Left renal atrophy. No hydronephrosis. Adrenal glands unremarkable. Stomach/Bowel: No bowel obstruction. Stomach, large and small bowel grossly unremarkable. Moderate stool burden throughout the colon. Vascular/Lymphatic: Aortic atherosclerosis. No evidence of aneurysm or adenopathy. Reproductive: Prior hysterectomy.  No adnexal masses. Other: No free fluid or free air. Musculoskeletal: No acute bony abnormality. IMPRESSION: Changes of cystectomy with right lower quadrant urostomy. Ureteral stents remain in place. No hydronephrosis. Moderate stool burden in the colon. No acute findings. Aortic atherosclerosis. Electronically Signed   By: Charlett Nose M.D.   On: 03/14/2021 01:31        Scheduled Meds:  ARIPiprazole  30 mg Oral Daily   aspirin EC  81 mg Oral Daily   atorvastatin  10 mg Oral QHS   busPIRone  7.5 mg Oral BID   DULoxetine  30 mg Oral QHS   heparin  5,000 Units Subcutaneous Q8H   pantoprazole  40 mg Oral Daily   polyethylene glycol  17 g Oral BID   Continuous Infusions:  meropenem (MERREM) IV 1 g (03/14/21 0447)     LOS: 0 days    Time spent: over 30 min    Lacretia Nicks, MD [facility]   To contact the attending provider between 7A-7P or the covering provider during after hours 7P-7A, please log into the web site [email or web address] and access using universal [place] password for that web site. If you do not have the password, please call the hospital operator.  03/14/2021, 10:52 AM

## 2021-03-14 NOTE — Assessment & Plan Note (Addendum)
MRI concerning for osteomyelitis. Infectious disease consulted and have recommended IR consult for biopsy and cultures since patient has a history of urethral cancer.  Patient also with recurrent positive urine culture, complicated by ileal conduit history. E. Coli (MDR) and Pseudomonas are recurrent pathogens. Blood cultures with no growth.  Transthoracic Echocardiogram with stable valve pathologies.  IR consulted and performed bone biopsy on 3/3. ID recommends meropenem on discharge for 6 weeks.  Follow-up in the clinic in 1 week.  ID will follow-up on bacterial fungal and AFB cultures.

## 2021-03-14 NOTE — Assessment & Plan Note (Addendum)
Wet prep positive only for WBCs.  GC/Chlamydia obtained and are negative; patient is low risk.  Patient with prior hysterectomy.  Prior provider, discussed with on-call gynecology, Dr. Ilda Basset, who recommended no further follow-up at this time.

## 2021-03-15 ENCOUNTER — Inpatient Hospital Stay (HOSPITAL_COMMUNITY): Payer: Medicare Other

## 2021-03-15 LAB — WET PREP, GENITAL
Clue Cells Wet Prep HPF POC: NONE SEEN
Sperm: NONE SEEN
Trich, Wet Prep: NONE SEEN
WBC, Wet Prep HPF POC: >=10 — AB (ref <10)
Yeast Wet Prep HPF POC: NONE SEEN

## 2021-03-15 LAB — SEDIMENTATION RATE: Sed Rate: 69 mm/hr — ABNORMAL HIGH (ref 0–22)

## 2021-03-15 LAB — BASIC METABOLIC PANEL
Anion gap: 6 (ref 5–15)
BUN: 22 mg/dL (ref 8–23)
CO2: 25 mmol/L (ref 22–32)
Calcium: 8.4 mg/dL — ABNORMAL LOW (ref 8.9–10.3)
Chloride: 104 mmol/L (ref 98–111)
Creatinine, Ser: 1.61 mg/dL — ABNORMAL HIGH (ref 0.44–1.00)
GFR, Estimated: 35 mL/min — ABNORMAL LOW (ref >60)
Glucose, Bld: 103 mg/dL — ABNORMAL HIGH (ref 70–99)
Potassium: 3.9 mmol/L (ref 3.5–5.1)
Sodium: 135 mmol/L (ref 135–145)

## 2021-03-15 LAB — CBC
HCT: 31.5 % — ABNORMAL LOW (ref 36.0–46.0)
Hemoglobin: 10 g/dL — ABNORMAL LOW (ref 12.0–15.0)
MCH: 29.2 pg (ref 26.0–34.0)
MCHC: 31.7 g/dL (ref 30.0–36.0)
MCV: 91.8 fL (ref 80.0–100.0)
Platelets: 397 10*3/uL (ref 150–400)
RBC: 3.43 MIL/uL — ABNORMAL LOW (ref 3.87–5.11)
RDW: 14.3 % (ref 11.5–15.5)
WBC: 12.9 10*3/uL — ABNORMAL HIGH (ref 4.0–10.5)
nRBC: 0 % (ref 0.0–0.2)

## 2021-03-15 LAB — C-REACTIVE PROTEIN: CRP: 19.3 mg/dL — ABNORMAL HIGH (ref <1.0)

## 2021-03-15 MED ORDER — LIP MEDEX EX OINT
TOPICAL_OINTMENT | CUTANEOUS | Status: DC | PRN
  Administered 2021-03-15: 1 via TOPICAL
  Filled 2021-03-15: qty 7

## 2021-03-15 MED ORDER — VANCOMYCIN HCL 1250 MG/250ML IV SOLN
1250.0000 mg | Freq: Once | INTRAVENOUS | Status: AC
  Administered 2021-03-15: 1250 mg via INTRAVENOUS
  Filled 2021-03-15: qty 250

## 2021-03-15 MED ORDER — VANCOMYCIN HCL IN DEXTROSE 1-5 GM/200ML-% IV SOLN
1000.0000 mg | INTRAVENOUS | Status: DC
  Administered 2021-03-17 – 2021-03-21 (×3): 1000 mg via INTRAVENOUS
  Filled 2021-03-15 (×3): qty 200

## 2021-03-15 NOTE — Progress Notes (Addendum)
PROGRESS NOTE    Shelia Chan  VFI:[id number] DOB: 1955-05-03 DOA: 03/13/2021 PCP: Ellyn Hack, MD  Chief Complaint  Patient presents with   Abdominal Pain    Brief Narrative:  66 yo with hx urethral cancer s/p radical cystectomy, urethrectomy, and ileal conduit, COPD, HFpEF, depression/anxiety who presents with fevers and abdominal pain concerning for UTI.  Notably with history of pseudomonas and ESBL in the past.      Assessment & Plan:   Principal Problem:   UTI (urinary tract infection) Active Problems:   Vaginal discharge   CKD (chronic kidney disease), stage IV (HCC)   Chronic diastolic CHF (congestive heart failure) (HCC)   COPD (chronic obstructive pulmonary disease) (HCC)   Depression with anxiety   Assessment and Plan: * UTI (urinary tract infection)- (present on admission) Hx urethral cancer s/p radical cystectomy, urethrectomy, and ileal conduit Has suprapubic discomfort (discussed with urology who noted can have referred pain) UA with positive nitrite, LE, many bacteria, >50 WBC Follow urine culture (e. Coli and pseudomonas) blood cultures NGx1 CT with cystectomy with RLQ urostomy, ureteral stents in place, no hydro Currently on meropenem given hx pseudomonas 10/2020 and ESBL 07/2020 Pain in suprapubic region/over pubic symphysis, seems more impressive than I'd expect with UTI and she notes this pain is unusual for her CRP ~19, sed rate 69 Will follow MRI of pelvis She notes yellowish vaginal discharge, wet prep with WBC's only Discussed with urology, nothing from their standpoint right now   Vaginal discharge Wet prep with only WBC's Denies sexual activity  Follow gc/chlam  CKD (chronic kidney disease), stage IV (HCC)- (present on admission) Creatinine appears around baseline  Chronic diastolic CHF (congestive heart failure) (HCC)- (present on admission) Appears euvolemic at this time, will continue to monitor, hold lasix  COPD (chronic  obstructive pulmonary disease) (HCC)- (present on admission) No evidence exacberation  Depression with anxiety- (present on admission) Abilify, buspar, cymbalta   DVT prophylaxis: heparin Code Status: dnr Family Communication: none Disposition:   Status is: Inpatient  Remains inpatient appropriate because: need for IV abx   Consultants:  none  Procedures:  none  Antimicrobials:  Anti-infectives (From admission, onward)    Start     Dose/Rate Route Frequency Ordered Stop   03/15/21 0600  meropenem (MERREM) 500 mg in sodium chloride 0.9 % 100 mL IVPB        500 mg 200 mL/hr over 30 Minutes Intravenous Every 12 hours 03/14/21 1520     03/14/21 0400  meropenem (MERREM) 1 g in sodium chloride 0.9 % 100 mL IVPB  Status:  Discontinued        1 g 200 mL/hr over 30 Minutes Intravenous Every 12 hours 03/14/21 0343 03/14/21 1520   03/14/21 0245  piperacillin-tazobactam (ZOSYN) IVPB 3.375 g        3.375 g 100 mL/hr over 30 Minutes Intravenous  Once 03/14/21 0233 03/14/21 0330       Subjective: Continued pain, doesn't feel any better  Objective: Vitals:   03/15/21 0530 03/15/21 0726 03/15/21 0934 03/15/21 1404  BP: (!) 105/59 103/61 105/69 98/64  Pulse: 71 73 73 71  Resp: 18 16 16 16   Temp: 98.6 F (37 C) 98.5 F (36.9 C) 98.4 F (36.9 C) 98 F (36.7 C)  TempSrc: Oral Oral    SpO2: 96% 99% 100% 100%  Weight:      Height:        Intake/Output Summary (Last 24 hours) at 03/15/2021 1608 Last data  filed at 03/15/2021 1500 Gross per 24 hour  Intake 1190 ml  Output 1500 ml  Net -310 ml   Filed Weights   03/14/21 1320  Weight: 57.8 kg    Examination:  General: No acute distress. Cardiovascular: RRR Lungs: unlabored Abdomen: still significant TTP in suprapubic region and pubic symphysis - seems more impressive than I'd expect based on visual exam and what we're currently treating Neurological: Alert and oriented 3. Moves all extremities 4 with equal strength.  Cranial nerves II through XII grossly intact. Skin: Warm and dry. No rashes or lesions. Extremities: No clubbing or cyanosis. No edema.   Data Reviewed: I have personally reviewed following labs and imaging studies  CBC: Recent Labs  Lab 03/13/21 2323 03/14/21 0439 03/15/21 0402  WBC 13.0* 11.6* 12.9*  NEUTROABS 10.5*  --   --   HGB 11.9* 10.4* 10.0*  HCT 36.3 33.2* 31.5*  MCV 88.8 92.7 91.8  PLT 468* 367 397    Basic Metabolic Panel: Recent Labs  Lab 03/13/21 2323 03/14/21 0439 03/15/21 0402  NA 134* 135 135  K 4.4 3.8 3.9  CL 94* 102 104  CO2 28 23 25   GLUCOSE 86 95 103*  BUN 31* 27* 22  CREATININE 2.13* 1.87* 1.61*  CALCIUM 9.1 8.1* 8.4*    GFR: Estimated Creatinine Clearance: 28.1 mL/min (Donyale Berthold) (by C-G formula based on SCr of 1.61 mg/dL (H)).  Liver Function Tests: Recent Labs  Lab 03/13/21 2323  AST 18  ALT 14  ALKPHOS 101  BILITOT 1.0  PROT 8.2*  ALBUMIN 3.7    CBG: No results for input(s): GLUCAP in the last 168 hours.   Recent Results (from the past 240 hour(s))  Blood culture (routine x 2)     Status: None (Preliminary result)   Collection Time: 03/13/21 11:23 PM   Specimen: BLOOD  Result Value Ref Range Status   Specimen Description   Final    BLOOD LEFT ANTECUBITAL Performed at [facility], [address], [place], [place] [address]    Special Requests   Final    BOTTLES DRAWN AEROBIC ONLY Blood Culture adequate volume Performed at [facility], [address], [place], [place] [address]    Culture   Final    NO GROWTH 1 DAY Performed at [facility], [address], [place], [place] [address]    Report Status PENDING  Incomplete  Blood culture (routine x 2)     Status: None (Preliminary result)   Collection Time: 03/13/21 11:46 PM   Specimen: BLOOD  Result Value Ref Range Status   Specimen Description   Final    BLOOD BLOOD RIGHT FOREARM Performed at [facility],  [address], [place], [place] [address]    Special Requests   Final    BOTTLES DRAWN AEROBIC AND ANAEROBIC Blood Culture adequate volume Performed at [facility], [address], [place], [place] [address]    Culture   Final    NO GROWTH 1 DAY Performed at [facility], [address] [place], [place] [address]    Report Status PENDING  Incomplete  Urine Culture     Status: Abnormal (Preliminary result)   Collection Time: 03/14/21  2:09 AM   Specimen: Urine, Catheterized  Result Value Ref Range Status   Specimen Description   Final    URINE, CATHETERIZED Performed at [facility], [address], [place], [place] [address]  Special Requests   Final    NONE Performed at [facility], [address] [place], [place] [address]    Culture (Yani Lal)  Final    >=100,000 COLONIES/mL ESCHERICHIA COLI 60,000 COLONIES/mL PSEUDOMONAS AERUGINOSA SUSCEPTIBILITIES TO FOLLOW Performed at [facility], [address] [place], [place] [address]    Report Status PENDING  Incomplete  Resp Panel by RT-PCR (Flu Brandyce Dimario&B, Covid) Nasopharyngeal Swab     Status: None   Collection Time: 03/14/21  2:09 AM   Specimen: Nasopharyngeal Swab; Nasopharyngeal(NP) swabs in vial transport medium  Result Value Ref Range Status   SARS Coronavirus 2 by RT PCR NEGATIVE NEGATIVE Final    Comment: (NOTE) SARS-CoV-2 target nucleic acids are NOT DETECTED.  The SARS-CoV-2 RNA is generally detectable in upper respiratory specimens during the acute phase of infection. The lowest concentration of SARS-CoV-2 viral copies this assay can detect is 138 copies/mL. Kei Mcelhiney negative result does not preclude SARS-Cov-2 infection and should not be used as the sole basis for treatment or other patient management decisions. Shekelia Boutin negative result may occur with  improper specimen collection/handling, submission of specimen other than nasopharyngeal swab, presence of viral  mutation(s) within the areas targeted by this assay, and inadequate number of viral copies(<138 copies/mL). Genelle Economou negative result must be combined with clinical observations, patient history, and epidemiological information. The expected result is Negative.  Fact Sheet for Patients:  BloggerCourse.com  Fact Sheet for Healthcare Providers:  SeriousBroker.it  This test is no t yet approved or cleared by the [place] FDA and  has been authorized for detection and/or diagnosis of SARS-CoV-2 by FDA under an Emergency Use Authorization (EUA). This EUA will remain  in effect (meaning this test can be used) for the duration of the COVID-19 declaration under Section 564(b)(1) of the Act, 21 U.S.C.section 360bbb-3(b)(1), unless the authorization is terminated  or revoked sooner.       Influenza Ramone Gander by PCR NEGATIVE NEGATIVE Final   Influenza B by PCR NEGATIVE NEGATIVE Final    Comment: (NOTE) The Xpert Xpress SARS-CoV-2/FLU/RSV plus assay is intended as an aid in the diagnosis of influenza from Nasopharyngeal swab specimens and should not be used as Daveah Varone sole basis for treatment. Nasal washings and aspirates are unacceptable for Xpert Xpress SARS-CoV-2/FLU/RSV testing.  Fact Sheet for Patients: BloggerCourse.com  Fact Sheet for Healthcare Providers: SeriousBroker.it  This test is not yet approved or cleared by the [place] FDA and has been authorized for detection and/or diagnosis of SARS-CoV-2 by FDA under an Emergency Use Authorization (EUA). This EUA will remain in effect (meaning this test can be used) for the duration of the COVID-19 declaration under Section 564(b)(1) of the Act, 21 U.S.C. section 360bbb-3(b)(1), unless the authorization is terminated or revoked.  Performed at [facility], [address] [place], [place] [address]   Wet prep, genital      Status: Abnormal   Collection Time: 03/15/21  1:00 PM   Specimen: Vaginal  Result Value Ref Range Status   Yeast Wet Prep HPF POC NONE SEEN NONE SEEN Final   Trich, Wet Prep NONE SEEN NONE SEEN Final   Clue Cells Wet Prep HPF POC NONE SEEN NONE SEEN Final   WBC, Wet Prep HPF POC >=10 (Aston Lieske) <10 Final   Sperm NONE SEEN  Final    Comment: Performed at [facility], [address], [place], [place] [address]         Radiology Studies: CT  ABDOMEN PELVIS WO CONTRAST  Result Date: 03/14/2021 CLINICAL DATA:  Lower abdominal pain EXAM: CT ABDOMEN AND PELVIS WITHOUT CONTRAST TECHNIQUE: Multidetector CT imaging of the abdomen and pelvis was performed following the standard protocol without IV contrast. RADIATION DOSE REDUCTION: This exam was performed according to the departmental dose-optimization program which includes automated exposure control, adjustment of the mA and/or kV according to patient size and/or use of iterative reconstruction technique. COMPARISON:  11/08/2020 FINDINGS: Lower chest: No acute abnormality. Hepatobiliary: No focal hepatic abnormality. Gallbladder unremarkable. Pancreas: No focal abnormality or ductal dilatation. Spleen: No focal abnormality.  Normal size. Adrenals/Urinary Tract: Prior cystectomy with right lower quadrant urostomy. Ureteral stents extend through the urostomy and into the renal pelvis bilaterally. Left renal atrophy. No hydronephrosis. Adrenal glands unremarkable. Stomach/Bowel: No bowel obstruction. Stomach, large and small bowel grossly unremarkable. Moderate stool burden throughout the colon. Vascular/Lymphatic: Aortic atherosclerosis. No evidence of aneurysm or adenopathy. Reproductive: Prior hysterectomy.  No adnexal masses. Other: No free fluid or free air. Musculoskeletal: No acute bony abnormality. IMPRESSION: Changes of cystectomy with right lower quadrant urostomy. Ureteral stents remain in place. No hydronephrosis. Moderate stool  burden in the colon. No acute findings. Aortic atherosclerosis. Electronically Signed   By: Charlett Nose M.D.   On: 03/14/2021 01:31        Scheduled Meds:  ARIPiprazole  30 mg Oral Daily   aspirin EC  81 mg Oral Daily   atorvastatin  10 mg Oral QHS   busPIRone  7.5 mg Oral BID   DULoxetine  30 mg Oral QHS   heparin  5,000 Units Subcutaneous Q8H   pantoprazole  40 mg Oral Daily   polyethylene glycol  17 g Oral BID   Continuous Infusions:  meropenem (MERREM) IV 500 mg (03/15/21 0606)     LOS: 1 day    Time spent: over 30 min    Lacretia Nicks, MD [facility]   To contact the attending provider between 7A-7P or the covering provider during after hours 7P-7A, please log into the web site [email or web address] and access using universal [place] password for that web site. If you do not have the password, please call the hospital operator.  03/15/2021, 4:08 PM

## 2021-03-15 NOTE — Progress Notes (Signed)
Pharmacy Antibiotic Note  Shelia Chan is a 66 y.o. female admitted on 03/13/2021 with possible UTI.  MD concerned for other sources of infection such as SSTI or abscess, and would like to broaden coverage.  Pharmacy has been consulted for vancomycin dosing.  Plan: Vancomycin 1250 mg IV x1, followed by Vancomycin 1000mg  IV q48 hours (eAUC 463, Scr 1.61, Vd 0.72) Continue meropenem 500mg  IV q12 hours F/u MRI pelvis F/u ability to narrow antibiotics Monitor renal function and culture data   Height: 5\' 1"  (154.9 cm) Weight: 57.8 kg (127 lb 6.8 oz) IBW/kg (Calculated) : 47.8  Temp (24hrs), Avg:98.7 F (37.1 C), Min:98 F (36.7 C), Max:99.7 F (37.6 C)  Recent Labs  Lab 03/13/21 2323 03/14/21 0000 03/14/21 0439 03/15/21 0402  WBC 13.0*  --  11.6* 12.9*  CREATININE 2.13*  --  1.87* 1.61*  LATICACIDVEN  --  1.1  --   --     Estimated Creatinine Clearance: 28.1 mL/min (A) (by C-G formula based on SCr of 1.61 mg/dL (H)).    Allergies  Allergen Reactions   Metoprolol Shortness Of Breath    Antimicrobials this admission:  2/26 Zosyn x1 2/26 Meropenem >>  2/27 Vancomycin >>   Dose adjustments this admission: 2/26 Mero 1 gm q12>>500 q12   Microbiology results:  2/25 BCx2: ngtd 2/26 UCx: > 100K E coli, 60 K PsA  Thank you for allowing pharmacy to be a part of this patients care.  Dimple Nanas, PharmD 03/15/2021 4:07 PM

## 2021-03-15 NOTE — Progress Notes (Signed)
Transition of Care (TOC) Screening Note  Patient Details  Name: Shelia Chan Date of Birth: 02/27/1955  Transition of Care [facility]) CM/SW Contact:    Sherie Don, LCSW Phone Number: 03/15/2021, 10:28 AM  Transition of Care Department [facility]) has reviewed patient and no TOC needs have been identified at this time. We will continue to monitor patient advancement through interdisciplinary progression rounds. If new patient transition needs arise, please place a TOC consult.

## 2021-03-16 ENCOUNTER — Inpatient Hospital Stay (HOSPITAL_COMMUNITY): Payer: Medicare Other

## 2021-03-16 LAB — BASIC METABOLIC PANEL
Anion gap: 7 (ref 5–15)
BUN: 22 mg/dL (ref 8–23)
CO2: 23 mmol/L (ref 22–32)
Calcium: 8.5 mg/dL — ABNORMAL LOW (ref 8.9–10.3)
Chloride: 105 mmol/L (ref 98–111)
Creatinine, Ser: 1.94 mg/dL — ABNORMAL HIGH (ref 0.44–1.00)
GFR, Estimated: 28 mL/min — ABNORMAL LOW (ref >60)
Glucose, Bld: 98 mg/dL (ref 70–99)
Potassium: 3.5 mmol/L (ref 3.5–5.1)
Sodium: 135 mmol/L (ref 135–145)

## 2021-03-16 LAB — SEDIMENTATION RATE: Sed Rate: 77 mm/hr — ABNORMAL HIGH (ref 0–22)

## 2021-03-16 LAB — URINE CULTURE: Culture: >=100000 — AB

## 2021-03-16 LAB — CBC
HCT: 30 % — ABNORMAL LOW (ref 36.0–46.0)
Hemoglobin: 9.6 g/dL — ABNORMAL LOW (ref 12.0–15.0)
MCH: 29.2 pg (ref 26.0–34.0)
MCHC: 32 g/dL (ref 30.0–36.0)
MCV: 91.2 fL (ref 80.0–100.0)
Platelets: 433 10*3/uL — ABNORMAL HIGH (ref 150–400)
RBC: 3.29 MIL/uL — ABNORMAL LOW (ref 3.87–5.11)
RDW: 14.2 % (ref 11.5–15.5)
WBC: 11.1 10*3/uL — ABNORMAL HIGH (ref 4.0–10.5)
nRBC: 0 % (ref 0.0–0.2)

## 2021-03-16 LAB — GC/CHLAMYDIA PROBE AMP (~~LOC~~) NOT AT ARMC
Chlamydia: NEGATIVE
Comment: NEGATIVE
Comment: NORMAL
Neisseria Gonorrhea: NEGATIVE

## 2021-03-16 LAB — C-REACTIVE PROTEIN: CRP: 17.3 mg/dL — ABNORMAL HIGH (ref <1.0)

## 2021-03-16 MED ORDER — GADOBUTROL 1 MMOL/ML IV SOLN
5.0000 mL | Freq: Once | INTRAVENOUS | Status: AC | PRN
  Administered 2021-03-16: 5 mL via INTRAVENOUS

## 2021-03-16 NOTE — Progress Notes (Addendum)
PROGRESS NOTE    Shelia Chan  UXN:[id number] DOB: April 06, 1955 DOA: 03/13/2021 PCP: Ellyn Hack, MD  Chief Complaint  Patient presents with   Abdominal Pain    Brief Narrative:  66 yo with hx urethral cancer s/p radical cystectomy, urethrectomy, and ileal conduit, COPD, HFpEF, depression/anxiety who presents with fevers and abdominal pain concerning for UTI.  Notably with history of pseudomonas and ESBL in the past.    MRI pelvis done for suprapubic and pubic symphysis pain, findings concerning for infection/osteo.  Will follow dedicated [facility] protocol pelvic MRI and ask ID to see.     Assessment & Plan:   Principal Problem:   Infection of pelvis (HCC) Active Problems:   Vaginal discharge   CKD (chronic kidney disease), stage IV (HCC)   Chronic diastolic CHF (congestive heart failure) (HCC)   COPD (chronic obstructive pulmonary disease) (HCC)   Depression with anxiety   Assessment and Plan: * Infection of pelvis (HCC)- (present on admission) Hx urethral cancer s/p radical cystectomy, urethrectomy, and ileal conduit Has suprapubic discomfort (discussed with urology who noted can have referred pain) UA with positive nitrite, LE, many bacteria, >50 WBC Follow urine culture (e. Coli and pseudomonas) --- I don't think presentation is due to symptomatic UTI.  Concerned for infection of pubic bones/pelvis (see MRI below). blood cultures NGx1 CT with cystectomy with RLQ urostomy, ureteral stents in place, no hydro Continue meropenem and vancomycin.  CRP ~19, sed rate 69 Will follow MRI of pelvis   - with concern for infection/osteomyelitis (abnormal T2 signal in pubic bones extending into anterior inferior pubic rami bilaterally (R>L).  Abnormal signal in R femoral neck, osteo not excluded.  Marked edema in thighs bilaterally, R>L.  Recommended MS[facility] protocol pelvic MRI with and without contrast, discussed with radiology who noted ok for contrast. She notes yellowish vaginal  discharge, wet prep with WBC's only, gc/chlam pending Discussed with urology, nothing from their standpoint right now  Will ask ID to see  Vaginal discharge Wet prep with only WBC's Denies sexual activity  Follow gc/chlam  CKD (chronic kidney disease), stage IV (HCC)- (present on admission) Creatinine appears around baseline Fluctuating   Chronic diastolic CHF (congestive heart failure) (HCC)- (present on admission) Appears euvolemic at this time, will continue to monitor, hold lasix  COPD (chronic obstructive pulmonary disease) (HCC)- (present on admission) No evidence exacberation  Depression with anxiety- (present on admission) Abilify, buspar, cymbalta   DVT prophylaxis: heparin Code Status: dnr Family Communication: none Disposition:   Status is: Inpatient  Remains inpatient appropriate because: need for IV abx   Consultants:  none  Procedures:  none  Antimicrobials:  Anti-infectives (From admission, onward)    Start     Dose/Rate Route Frequency Ordered Stop   03/17/21 1700  vancomycin (VANCOCIN) IVPB 1000 mg/200 mL premix        1,000 mg 200 mL/hr over 60 Minutes Intravenous Every 48 hours 03/15/21 1712     03/15/21 1700  vancomycin (VANCOREADY) IVPB 1250 mg/250 mL        1,250 mg 166.7 mL/hr over 90 Minutes Intravenous  Once 03/15/21 1613 03/15/21 1851   03/15/21 0600  meropenem (MERREM) 500 mg in sodium chloride 0.9 % 100 mL IVPB        500 mg 200 mL/hr over 30 Minutes Intravenous Every 12 hours 03/14/21 1520     03/14/21 0400  meropenem (MERREM) 1 g in sodium chloride 0.9 % 100 mL IVPB  Status:  Discontinued  1 g 200 mL/hr over 30 Minutes Intravenous Every 12 hours 03/14/21 0343 03/14/21 1520   03/14/21 0245  piperacillin-tazobactam (ZOSYN) IVPB 3.375 g        3.375 g 100 mL/hr over 30 Minutes Intravenous  Once 03/14/21 0233 03/14/21 0330       Subjective: Feels the same  Objective: Vitals:   03/15/21 0934 03/15/21 1404 03/15/21 2024  03/16/21 0629  BP: 105/69 98/64 (!) 103/55 100/67  Pulse: 73 71 68 66  Resp: 16 16 18 18   Temp: 98.4 F (36.9 C) 98 F (36.7 C) 99.7 F (37.6 C) 98.1 F (36.7 C)  TempSrc:   Oral Oral  SpO2: 100% 100% 97% 98%  Weight:      Height:        Intake/Output Summary (Last 24 hours) at 03/16/2021 1453 Last data filed at 03/16/2021 0600 Gross per 24 hour  Intake 825 ml  Output 1952 ml  Net -1127 ml   Filed Weights   03/14/21 1320  Weight: 57.8 kg    Examination:  General: No acute distress. Cardiovascular: RRR Lungs: unlabored Abdomen: continued suprapubic TTP and ttp over pubic symphysis Neurological: Alert and oriented 3. Moves all extremities 4. Cranial nerves II through XII grossly intact. Skin: Warm and dry. No rashes or lesions. Extremities: No clubbing or cyanosis. No edema.   Data Reviewed: I have personally reviewed following labs and imaging studies  CBC: Recent Labs  Lab 03/13/21 2323 03/14/21 0439 03/15/21 0402 03/16/21 0412  WBC 13.0* 11.6* 12.9* 11.1*  NEUTROABS 10.5*  --   --   --   HGB 11.9* 10.4* 10.0* 9.6*  HCT 36.3 33.2* 31.5* 30.0*  MCV 88.8 92.7 91.8 91.2  PLT 468* 367 397 433*    Basic Metabolic Panel: Recent Labs  Lab 03/13/21 2323 03/14/21 0439 03/15/21 0402 03/16/21 0412  NA 134* 135 135 135  K 4.4 3.8 3.9 3.5  CL 94* 102 104 105  CO2 28 23 25 23   GLUCOSE 86 95 103* 98  BUN 31* 27* 22 22  CREATININE 2.13* 1.87* 1.61* 1.94*  CALCIUM 9.1 8.1* 8.4* 8.5*    GFR: Estimated Creatinine Clearance: 23.3 mL/min (Quintara Bost) (by C-G formula based on SCr of 1.94 mg/dL (H)).  Liver Function Tests: Recent Labs  Lab 03/13/21 2323  AST 18  ALT 14  ALKPHOS 101  BILITOT 1.0  PROT 8.2*  ALBUMIN 3.7    CBG: No results for input(s): GLUCAP in the last 168 hours.   Recent Results (from the past 240 hour(s))  Blood culture (routine x 2)     Status: None (Preliminary result)   Collection Time: 03/13/21 11:23 PM   Specimen: BLOOD  Result  Value Ref Range Status   Specimen Description   Final    BLOOD LEFT ANTECUBITAL Performed at [facility], [address], [place], [place] [address]    Special Requests   Final    BOTTLES DRAWN AEROBIC ONLY Blood Culture adequate volume Performed at [facility], [address], [place], [place] [address]    Culture   Final    NO GROWTH 2 DAYS Performed at [facility], [address], [place], [place] [address]    Report Status PENDING  Incomplete  Blood culture (routine x 2)     Status: None (Preliminary result)   Collection Time: 03/13/21 11:46 PM   Specimen: BLOOD  Result Value Ref Range Status   Specimen Description   Final  BLOOD BLOOD RIGHT FOREARM Performed at [facility], [address], [place], [place] [address]    Special Requests   Final    BOTTLES DRAWN AEROBIC AND ANAEROBIC Blood Culture adequate volume Performed at [facility], [address] [place], [place] [address]    Culture   Final    NO GROWTH 2 DAYS Performed at [facility], [address], [place], [place] [address]    Report Status PENDING  Incomplete  Urine Culture     Status: Abnormal   Collection Time: 03/14/21  2:09 AM   Specimen: Urine, Catheterized  Result Value Ref Range Status   Specimen Description   Final    URINE, CATHETERIZED Performed at [facility], [address], [place], [place] [address]    Special Requests   Final    NONE Performed at [facility], [address], [place], [place] [address]    Culture (Farin Buhman)  Final    >=100,000 COLONIES/mL ESCHERICHIA COLI 60,000 COLONIES/mL PSEUDOMONAS AERUGINOSA Confirmed Extended Spectrum Beta-Lactamase Producer (ESBL).  In bloodstream infections from ESBL organisms, carbapenems are preferred over piperacillin/tazobactam. They are shown to have Akshay Spang lower risk of mortality.    Report Status 03/16/2021 FINAL   Final   Organism ID, Bacteria ESCHERICHIA COLI (Cyndel Griffey)  Final   Organism ID, Bacteria PSEUDOMONAS AERUGINOSA (Dmya Long)  Final      Susceptibility   Escherichia coli - MIC*    AMPICILLIN >=32 RESISTANT Resistant     CEFAZOLIN >=64 RESISTANT Resistant     CEFEPIME 16 RESISTANT Resistant     CEFTRIAXONE >=64 RESISTANT Resistant     CIPROFLOXACIN >=4 RESISTANT Resistant     GENTAMICIN <=1 SENSITIVE Sensitive     IMIPENEM <=0.25 SENSITIVE Sensitive     NITROFURANTOIN <=16 SENSITIVE Sensitive     TRIMETH/SULFA >=320 RESISTANT Resistant     AMPICILLIN/SULBACTAM 8 SENSITIVE Sensitive     PIP/TAZO <=4 SENSITIVE Sensitive     * >=100,000 COLONIES/mL ESCHERICHIA COLI   Pseudomonas aeruginosa - MIC*    CEFTAZIDIME 4 SENSITIVE Sensitive     CIPROFLOXACIN 0.5 SENSITIVE Sensitive     GENTAMICIN <=1 SENSITIVE Sensitive     IMIPENEM 2 SENSITIVE Sensitive     PIP/TAZO <=4 SENSITIVE Sensitive     CEFEPIME 2 SENSITIVE Sensitive     * 60,000 COLONIES/mL PSEUDOMONAS AERUGINOSA  Resp Panel by RT-PCR (Flu [place] [facility], Covid) Nasopharyngeal Swab     Status: None   Collection Time: 03/14/21  2:09 AM   Specimen: Nasopharyngeal Swab; Nasopharyngeal(NP) swabs in vial transport medium  Result Value Ref Range Status   SARS Coronavirus 2 by RT PCR NEGATIVE NEGATIVE Final    Comment: (NOTE) SARS-CoV-2 target nucleic acids are NOT DETECTED.  The SARS-CoV-2 RNA is generally detectable in upper respiratory specimens during the acute phase of infection. The lowest concentration of SARS-CoV-2 viral copies this assay can detect is 138 copies/mL. Reisha Wos negative result does not preclude SARS-Cov-2 infection and should not be used as the sole basis for treatment or other patient management decisions. Kemon Devincenzi negative result may occur with  improper specimen collection/handling, submission of specimen other than nasopharyngeal swab, presence of viral mutation(s) within the areas targeted by this assay, and inadequate number of viral copies(<138  copies/mL). Eboney Claybrook negative result must be combined with clinical observations, patient history, and epidemiological information. The expected result is Negative.  Fact Sheet for Patients:  BloggerCourse.com  Fact Sheet for Healthcare Providers:  SeriousBroker.it  This  test is no t yet approved or cleared by the [place] and  has been authorized for detection and/or diagnosis of SARS-CoV-2 by FDA under an Emergency Use Authorization (EUA). This EUA will remain  in effect (meaning this test can be used) for the duration of the COVID-19 declaration under Section 564(b)(1) of the Act, 21 U.S.C.section 360bbb-3(b)(1), unless the authorization is terminated  or revoked sooner.       Influenza Masha Orbach by PCR NEGATIVE NEGATIVE Final   Influenza B by PCR NEGATIVE NEGATIVE Final    Comment: (NOTE) The Xpert Xpress SARS-CoV-2/FLU/RSV plus assay is intended as an aid in the diagnosis of influenza from Nasopharyngeal swab specimens and should not be used as Christianna Belmonte sole basis for treatment. Nasal washings and aspirates are unacceptable for Xpert Xpress SARS-CoV-2/FLU/RSV testing.  Fact Sheet for Patients: BloggerCourse.com  Fact Sheet for Healthcare Providers: SeriousBroker.it  This test is not yet approved or cleared by the [place] FDA and has been authorized for detection and/or diagnosis of SARS-CoV-2 by FDA under an Emergency Use Authorization (EUA). This EUA will remain in effect (meaning this test can be used) for the duration of the COVID-19 declaration under Section 564(b)(1) of the Act, 21 U.S.C. section 360bbb-3(b)(1), unless the authorization is terminated or revoked.  Performed at [facility], [address], [place], [place] [address]   Wet prep, genital     Status: Abnormal   Collection Time: 03/15/21  1:00 PM   Specimen: Vaginal  Result Value Ref  Range Status   Yeast Wet Prep HPF POC NONE SEEN NONE SEEN Final   Trich, Wet Prep NONE SEEN NONE SEEN Final   Clue Cells Wet Prep HPF POC NONE SEEN NONE SEEN Final   WBC, Wet Prep HPF POC >=10 (Margart Zemanek) <10 Final   Sperm NONE SEEN  Final    Comment: Performed at [facility], [address], [place], [place] [address]         Radiology Studies: MR PELVIS WO CONTRAST  Result Date: 03/16/2021 CLINICAL DATA:  Pelvic pain.  Postmenopausal.  Pubic pain. EXAM: MRI PELVIS WITHOUT CONTRAST TECHNIQUE: Multiplanar multisequence MR imaging of the pelvis was performed. No intravenous contrast was administered. COMPARISON:  CT scan 03/14/2021 FINDINGS: Urinary Tract:  Cystectomy with right lower quadrant urostomy. Bowel: Diffusely distended bowel noted in the pelvis with prominent stool burden in the distal colon and rectum. Vascular/Lymphatic: Unremarkable on noncontrast imaging. No evidence for pelvic sidewall lymphadenopathy. Reproductive: Nonvisualization of the uterus suggest prior hysterectomy. No discernible adnexal mass. Other:  No substantial intraperitoneal free fluid. Musculoskeletal: Diffuse body wall edema noted in the pelvis. Fixation hardware noted left femoral neck. Abnormal T2 signal is identified in both pubic bones extending into the anterior inferior pubic rami bilaterally. Despite this, there does not appear to be Ollie Delano substantial amount of fluid in the joint of the symphysis pubis and cortical bone is preserved along the symphysis. This finding is associated with fairly marked abnormal T2 signal in the adductor muscles bilaterally, right greater than left suggesting edema/injury. And irregular focus of abnormal signal is identified in the right femoral neck without Diona Peregoy linear component to suggest fracture. Prominent edema is noted in the visualized thighs bilaterally, right greater than left. IMPRESSION: 1. Abnormal T2 signal in the pubic bones extending into the anterior inferior  pubic rami bilaterally. This is associated with substantial abnormal T2 signal in the adductor muscles bilaterally, right greater than left. Findings could be posttraumatic or related  to chronic stress injury in the region of the pubic symphysis although in the absence of recent trauma, the diffuse abnormal signal in the adductor muscles in bony anatomy raises the question of infection/osteomyelitis. Dedicated MSK protocol pelvic MRI with and without contrast might prove helpful to further evaluate. Use of intravenous contrast should be strongly considered to further evaluate for potential soft tissue abscess. 2. Small focus of abnormal signal in the right femoral neck without Nerea Bordenave linear component to suggest fracture. Osteomyelitis not excluded. Imaging features not highly suspicious for metastatic lesion. 3. Prominent stool burden in the distal colon and rectum. Correlation for clinical constipation recommended. 4. Marked edema in the visualized portion of the thighs bilaterally, right greater than left with diffuse body wall edema in the pelvis. 5. Status post hysterectomy with right lower quadrant urostomy. Electronically Signed   By: Kennith Center M.D.   On: 03/16/2021 09:02        Scheduled Meds:  ARIPiprazole  30 mg Oral Daily   aspirin EC  81 mg Oral Daily   atorvastatin  10 mg Oral QHS   busPIRone  7.5 mg Oral BID   DULoxetine  30 mg Oral QHS   heparin  5,000 Units Subcutaneous Q8H   pantoprazole  40 mg Oral Daily   polyethylene glycol  17 g Oral BID   Continuous Infusions:  meropenem (MERREM) IV 500 mg (03/16/21 1610)   [START ON 03/17/2021] vancomycin       LOS: 2 days    Time spent: over 30 min    Lacretia Nicks, MD [facility]   To contact the attending provider between 7A-7P or the covering provider during after hours 7P-7A, please log into the web site [email or web address] and access using universal [place] password for that web site. If you do not have the password,  please call the hospital operator.  03/16/2021, 2:53 PM

## 2021-03-17 DIAGNOSIS — M8618 Other acute osteomyelitis, other site: Principal | ICD-10-CM

## 2021-03-17 DIAGNOSIS — N184 Chronic kidney disease, stage 4 (severe): Secondary | ICD-10-CM | POA: Diagnosis not present

## 2021-03-17 DIAGNOSIS — M869 Osteomyelitis, unspecified: Secondary | ICD-10-CM

## 2021-03-17 DIAGNOSIS — I5032 Chronic diastolic (congestive) heart failure: Secondary | ICD-10-CM | POA: Diagnosis not present

## 2021-03-17 DIAGNOSIS — S72113A Displaced fracture of greater trochanter of unspecified femur, initial encounter for closed fracture: Secondary | ICD-10-CM | POA: Diagnosis present

## 2021-03-17 DIAGNOSIS — J449 Chronic obstructive pulmonary disease, unspecified: Secondary | ICD-10-CM | POA: Diagnosis not present

## 2021-03-17 LAB — CBC WITH DIFFERENTIAL/PLATELET
Abs Immature Granulocytes: 0.03 10*3/uL (ref 0.00–0.07)
Basophils Absolute: 0 10*3/uL (ref 0.0–0.1)
Basophils Relative: 0 %
Eosinophils Absolute: 0.1 10*3/uL (ref 0.0–0.5)
Eosinophils Relative: 1 %
HCT: 28.4 % — ABNORMAL LOW (ref 36.0–46.0)
Hemoglobin: 9.3 g/dL — ABNORMAL LOW (ref 12.0–15.0)
Immature Granulocytes: 0 %
Lymphocytes Relative: 17 %
Lymphs Abs: 1.4 10*3/uL (ref 0.7–4.0)
MCH: 28.8 pg (ref 26.0–34.0)
MCHC: 32.7 g/dL (ref 30.0–36.0)
MCV: 87.9 fL (ref 80.0–100.0)
Monocytes Absolute: 0.5 10*3/uL (ref 0.1–1.0)
Monocytes Relative: 6 %
Neutro Abs: 6.3 10*3/uL (ref 1.7–7.7)
Neutrophils Relative %: 76 %
Platelets: 440 10*3/uL — ABNORMAL HIGH (ref 150–400)
RBC: 3.23 MIL/uL — ABNORMAL LOW (ref 3.87–5.11)
RDW: 14.1 % (ref 11.5–15.5)
WBC: 8.4 10*3/uL (ref 4.0–10.5)
nRBC: 0 % (ref 0.0–0.2)

## 2021-03-17 LAB — COMPREHENSIVE METABOLIC PANEL
ALT: 11 U/L (ref 0–44)
AST: 14 U/L — ABNORMAL LOW (ref 15–41)
Albumin: 2.5 g/dL — ABNORMAL LOW (ref 3.5–5.0)
Alkaline Phosphatase: 75 U/L (ref 38–126)
Anion gap: 8 (ref 5–15)
BUN: 16 mg/dL (ref 8–23)
CO2: 22 mmol/L (ref 22–32)
Calcium: 8.3 mg/dL — ABNORMAL LOW (ref 8.9–10.3)
Chloride: 107 mmol/L (ref 98–111)
Creatinine, Ser: 1.58 mg/dL — ABNORMAL HIGH (ref 0.44–1.00)
GFR, Estimated: 36 mL/min — ABNORMAL LOW (ref >60)
Glucose, Bld: 96 mg/dL (ref 70–99)
Potassium: 3.8 mmol/L (ref 3.5–5.1)
Sodium: 137 mmol/L (ref 135–145)
Total Bilirubin: 0.2 mg/dL — ABNORMAL LOW (ref 0.3–1.2)
Total Protein: 6.1 g/dL — ABNORMAL LOW (ref 6.5–8.1)

## 2021-03-17 LAB — MAGNESIUM: Magnesium: 1.7 mg/dL (ref 1.7–2.4)

## 2021-03-17 LAB — PHOSPHORUS: Phosphorus: 3.7 mg/dL (ref 2.5–4.6)

## 2021-03-17 NOTE — Consult Note (Signed)
Reason for Consult: Subtle nondisplaced right greater trochanteric fracture seen on MRI Referring Physician:  Teryl Lucy, MD  Shelia Chan is an 66 y.o. female.  HPI: The patient is a very pleasant 66 year old female who is hospitalized for other issues as a relates to previous abdominal and urologic problems.  A MRI of the pelvis was obtained and it does show some edema around the greater trochanteric area and it looks like a subtle sign of a nondisplaced fracture.  The patient denies any pain with either hip with weightbearing.  She denies any pain on the right hip with weightbearing but does hurt when she lays on that side.  She did let me know that her dog pulled her down about 2 months ago and she landed hard on her right hip.  The MRI also shows some edema in the pubis area suggesting chronic osteomyelitis.  She is already followed by infectious disease for that and is on IV antibiotics appropriately.  Orthopedic surgery is consulted due to the nondisplaced right hip greater trochanteric fracture.  Past Medical History:  Diagnosis Date   Arthritis    Asthma    Blood transfusion without reported diagnosis    Cancer ([place])    CHF (congestive heart failure) ([facility])    COPD (chronic obstructive pulmonary disease) ([facility])    Heart murmur    Immune deficiency disorder ([place])    Renal disorder     Past Surgical History:  Procedure Laterality Date   BLADDER REMOVAL  118/22/2015   FRACTURE SURGERY Left 02/2018   pins   IR CATHETER TUBE CHANGE  04/15/2019   IR CATHETER TUBE CHANGE  04/15/2019   IR EXT NEPHROURETERAL CATH EXCHANGE  07/15/2019   IR EXT NEPHROURETERAL CATH EXCHANGE  10/14/2019   IR EXT NEPHROURETERAL CATH EXCHANGE  10/14/2019   IR EXT NEPHROURETERAL CATH EXCHANGE  01/06/2020   IR EXT NEPHROURETERAL CATH EXCHANGE  03/16/2020   IR EXT NEPHROURETERAL CATH EXCHANGE  05/25/2020   IR EXT NEPHROURETERAL CATH EXCHANGE  07/17/2020   IR EXT NEPHROURETERAL CATH EXCHANGE  07/17/2020   IR EXT  NEPHROURETERAL CATH EXCHANGE  08/14/2020   IR EXT NEPHROURETERAL CATH EXCHANGE  09/25/2020   IR EXT NEPHROURETERAL CATH EXCHANGE  11/06/2020   IR EXT NEPHROURETERAL CATH EXCHANGE  12/25/2020   IR EXT NEPHROURETERAL CATH EXCHANGE  02/09/2021    Family History  Problem Relation Age of Onset   Heart disease Maternal Grandfather     Social History:  reports that she has been smoking cigarettes and e-cigarettes. She has a 50.00 pack-year smoking history. She has never used smokeless tobacco. She reports that she does not currently use alcohol. She reports current drug use. Drug: Marijuana.  Allergies:  Allergies  Allergen Reactions   Metoprolol Shortness Of Breath    Medications: I have reviewed the patient's current medications.  Results for orders placed or performed during the hospital encounter of 03/13/21 (from the past 48 hour(s))  Basic metabolic panel     Status: Abnormal   Collection Time: 03/16/21  4:12 AM  Result Value Ref Range   Sodium 135 135 - 145 mmol/L   Potassium 3.5 3.5 - 5.1 mmol/L   Chloride 105 98 - 111 mmol/L   CO2 23 22 - 32 mmol/L   Glucose, Bld 98 70 - 99 mg/dL    Comment: Glucose reference range applies only to samples taken after fasting for at least 8 hours.   BUN 22 8 - 23 mg/dL   Creatinine, Ser 1.94 (  H) 0.44 - 1.00 mg/dL   Calcium 8.5 (L) 8.9 - 10.3 mg/dL   GFR, Estimated 28 (L) >60 mL/min    Comment: (NOTE) Calculated using the CKD-EPI Creatinine Equation (2021)    Anion gap 7 5 - 15    Comment: Performed at [facility], [place] [address], [place], [place] [address]  CBC     Status: Abnormal   Collection Time: 03/16/21  4:12 AM  Result Value Ref Range   WBC 11.1 (H) 4.0 - 10.5 K/uL   RBC 3.29 (L) 3.87 - 5.11 MIL/uL   Hemoglobin 9.6 (L) 12.0 - 15.0 g/dL   HCT 30.0 (L) 36.0 - 46.0 %   MCV 91.2 80.0 - 100.0 fL   MCH 29.2 26.0 - 34.0 pg   MCHC 32.0 30.0 - 36.0 g/dL   RDW 14.2 11.5 - 15.5 %   Platelets 433 (H) 150 - 400 K/uL    nRBC 0.0 0.0 - 0.2 %    Comment: Performed at [facility], [place] [address], [place], [place] [address]  C-reactive protein     Status: Abnormal   Collection Time: 03/16/21  4:12 AM  Result Value Ref Range   CRP 17.3 (H) <1.0 mg/dL    Comment: Performed at [place] [address] [place], [place] [address]  Sedimentation rate     Status: Abnormal   Collection Time: 03/16/21  4:12 AM  Result Value Ref Range   Sed Rate 77 (H) 0 - 22 mm/hr    Comment: Performed at [facility], [place] [address] [place], [place] [address]  CBC with Differential/Platelet     Status: Abnormal   Collection Time: 03/17/21  4:24 AM  Result Value Ref Range   WBC 8.4 4.0 - 10.5 K/uL   RBC 3.23 (L) 3.87 - 5.11 MIL/uL   Hemoglobin 9.3 (L) 12.0 - 15.0 g/dL   HCT 28.4 (L) 36.0 - 46.0 %   MCV 87.9 80.0 - 100.0 fL   MCH 28.8 26.0 - 34.0 pg   MCHC 32.7 30.0 - 36.0 g/dL   RDW 14.1 11.5 - 15.5 %   Platelets 440 (H) 150 - 400 K/uL   nRBC 0.0 0.0 - 0.2 %   Neutrophils Relative % 76 %   Neutro Abs 6.3 1.7 - 7.7 K/uL   Lymphocytes Relative 17 %   Lymphs Abs 1.4 0.7 - 4.0 K/uL   Monocytes Relative 6 %   Monocytes Absolute 0.5 0.1 - 1.0 K/uL   Eosinophils Relative 1 %   Eosinophils Absolute 0.1 0.0 - 0.5 K/uL   Basophils Relative 0 %   Basophils Absolute 0.0 0.0 - 0.1 K/uL   Immature Granulocytes 0 %   Abs Immature Granulocytes 0.03 0.00 - 0.07 K/uL    Comment: Performed at [facility], [place] [address] [place], [place] [address]  Comprehensive metabolic panel     Status: Abnormal   Collection Time: 03/17/21  4:24 AM  Result Value Ref Range   Sodium 137 135 - 145 mmol/L   Potassium 3.8 3.5 - 5.1 mmol/L   Chloride 107 98 - 111 mmol/L   CO2 22 22 - 32 mmol/L   Glucose, Bld 96 70 - 99 mg/dL    Comment: Glucose reference range applies only to samples taken after fasting for at least 8 hours.   BUN 16 8 - 23 mg/dL   Creatinine, Ser 1.58 (H) 0.44 -  1.00 mg/dL   Calcium  8.3 (L) 8.9 - 10.3 mg/dL   Total Protein 6.1 (L) 6.5 - 8.1 g/dL   Albumin 2.5 (L) 3.5 - 5.0 g/dL   AST 14 (L) 15 - 41 U/L   ALT 11 0 - 44 U/L   Alkaline Phosphatase 75 38 - 126 U/L   Total Bilirubin 0.2 (L) 0.3 - 1.2 mg/dL   GFR, Estimated 36 (L) >60 mL/min    Comment: (NOTE) Calculated using the CKD-EPI Creatinine Equation (2021)    Anion gap 8 5 - 15    Comment: Performed at [facility], [place] [address], [place], [place] [address]  Magnesium     Status: None   Collection Time: 03/17/21  4:24 AM  Result Value Ref Range   Magnesium 1.7 1.7 - 2.4 mg/dL    Comment: Performed at [facility], [place] [address], [place], [place] [address]  Phosphorus     Status: None   Collection Time: 03/17/21  4:24 AM  Result Value Ref Range   Phosphorus 3.7 2.5 - 4.6 mg/dL    Comment: Performed at [facility], [place] [address], [place], [place] [address]    MR PELVIS WO CONTRAST  Result Date: 03/16/2021 CLINICAL DATA:  Pelvic pain.  Postmenopausal.  Pubic pain. EXAM: MRI PELVIS WITHOUT CONTRAST TECHNIQUE: Multiplanar multisequence MR imaging of the pelvis was performed. No intravenous contrast was administered. COMPARISON:  CT scan 03/14/2021 FINDINGS: Urinary Tract:  Cystectomy with right lower quadrant urostomy. Bowel: Diffusely distended bowel noted in the pelvis with prominent stool burden in the distal colon and rectum. Vascular/Lymphatic: Unremarkable on noncontrast imaging. No evidence for pelvic sidewall lymphadenopathy. Reproductive: Nonvisualization of the uterus suggest prior hysterectomy. No discernible adnexal mass. Other:  No substantial intraperitoneal free fluid. Musculoskeletal: Diffuse body wall edema noted in the pelvis. Fixation hardware noted left femoral neck. Abnormal T2 signal is identified in both pubic bones extending into the anterior inferior pubic rami bilaterally. Despite this, there does not appear to  be a substantial amount of fluid in the joint of the symphysis pubis and cortical bone is preserved along the symphysis. This finding is associated with fairly marked abnormal T2 signal in the adductor muscles bilaterally, right greater than left suggesting edema/injury. And irregular focus of abnormal signal is identified in the right femoral neck without a linear component to suggest fracture. Prominent edema is noted in the visualized thighs bilaterally, right greater than left. IMPRESSION: 1. Abnormal T2 signal in the pubic bones extending into the anterior inferior pubic rami bilaterally. This is associated with substantial abnormal T2 signal in the adductor muscles bilaterally, right greater than left. Findings could be posttraumatic or related to chronic stress injury in the region of the pubic symphysis although in the absence of recent trauma, the diffuse abnormal signal in the adductor muscles in bony anatomy raises the question of infection/osteomyelitis. Dedicated MSK protocol pelvic MRI with and without contrast might prove helpful to further evaluate. Use of intravenous contrast should be strongly considered to further evaluate for potential soft tissue abscess. 2. Small focus of abnormal signal in the right femoral neck without a linear component to suggest fracture. Osteomyelitis not excluded. Imaging features not highly suspicious for metastatic lesion. 3. Prominent stool burden in the distal colon and rectum. Correlation for clinical constipation recommended. 4. Marked edema in the visualized portion of the thighs bilaterally, right greater than left with diffuse body wall edema in the pelvis. 5. Status post hysterectomy with right lower quadrant urostomy. Electronically  Signed   By: Misty Stanley M.D.   On: 03/16/2021 09:02   MR PELVIS W WO CONTRAST  Result Date: 03/17/2021 CLINICAL DATA:  Soft tissue infection of the pelvis. EXAM: MRI PELVIS WITHOUT AND WITH CONTRAST TECHNIQUE: Multiplanar  multisequence MR imaging of the pelvis was performed both before and after administration of intravenous contrast. CONTRAST:  69mL GADAVIST GADOBUTROL 1 MMOL/ML IV SOLN COMPARISON:  CT abdomen 03/14/2021 FINDINGS: Bones: Bone marrow edema in the right greater trochanter with a subtle linear component concerning for a non displaced isolated right greater trochanteric fracture. No other hip fracture, dislocation or avascular necrosis. Prior ORIF of the left hip with 3 cannulated screws. Severe bone marrow edema in the pubic bone bilaterally concerning for osteomyelitis. No symphyseal effusion. No periosteal reaction or bone destruction. No aggressive osseous lesion. Mild osteoarthritis of bilateral SI joints. Degenerative disease with disc height loss at L5-S1. broad-based disc bulges at L4-5 and L5-S1. Articular cartilage and labrum Articular cartilage: Partial-thickness cartilage loss of the femoral head and acetabulum bilaterally. Labrum: Grossly intact, but evaluation is limited by lack of intraarticular fluid. Joint or bursal effusion Joint effusion:  No hip joint effusion.  No SI joint effusion. Bursae:  No bursa formation. Muscles and tendons Flexors: Normal. Extensors: Normal. Abductors: Normal. Adductors: Soft tissue edema and enhancement throughout the adductor musculature bilaterally concerning for myositis secondary to an infectious etiology. Gluteals: Normal. Hamstrings: Normal. Other findings No pelvic free fluid. No fluid collection or hematoma. No inguinal lymphadenopathy. No inguinal hernia. Mild anasarca. IMPRESSION: 1. Bone marrow edema in the right greater trochanter with a subtle linear component concerning for a non displaced isolated right greater trochanteric fracture. 2. Severe bone marrow edema in the pubic bone bilaterally concerning for osteomyelitis. 3. Soft tissue edema and enhancement throughout the adductor musculature bilaterally concerning for myositis secondary to an infectious  etiology. Electronically Signed   By: Kathreen Devoid M.D.   On: 03/17/2021 08:11    Review of Systems Blood pressure 136/76, pulse 71, temperature 98.4 F (36.9 C), temperature source Oral, resp. rate 18, height 5\' 1"  (1.549 m), weight 57.8 kg, SpO2 99 %. Physical Exam Vitals reviewed.  Constitutional:      Appearance: She is well-developed.  Musculoskeletal:     Right hip: Bony tenderness present.  Neurological:     Mental Status: She is alert.    Assessment/Plan: Subtle stress fracture over the right hip greater trochanter which is stable  There is really no treatment for this other than time.  She can continue to weight-bear as tolerated.  She is only symptomatic with deep palpation over this area.  This may be associated with the trauma where she was pulled down hard by her dog and landed on her right hip.  There is no further treatment recommendations from orthopedic surgery.  From our standpoint she can be up as tolerated.  Mcarthur Rossetti 03/17/2021, 5:01 PM

## 2021-03-17 NOTE — Progress Notes (Addendum)
PROGRESS NOTE    Minh Hagman  WUJ:[id number] DOB: 1955/10/12 DOA: 03/13/2021 PCP: Ellyn Hack, MD   Brief Narrative: 66 yo with hx urethral cancer s/p radical cystectomy, urethrectomy, and ileal conduit, COPD, HFpEF, depression/anxiety who presents with fevers and abdominal pain concerning for UTI.  Notably with history of pseudomonas and ESBL in the past. MRI this admission significant for possible osteomyelitis. Infectious disease consulted. Incidentally, patient found to have a likely non-displaced right greater trochanteric fracture.   Assessment and Plan: * Infection of pelvis (HCC)- (present on admission) MRI concerning for osteomyelitis. Infectious disease consulted and have recommended IR consult for biopsy and cultures since patient has a history of urethral cancer. Patient also with recurrent positive urine culture, complicated by ileal conduit history. E. Coli (MDR) and Pseudomonas are recurrent pathogens. -Continue meropenem and Vancomycin -Follow-up biopsy results -Continued ID recommendations  Greater trochanter fracture (HCC) Unknown mechanism injury. Incidentally identified on MRI pelvis. Non-displaced. Asymptomatic. -Consult orthopedic surgery for recommendations and follow-up  Depression with anxiety- (present on admission) -Continue home Abilify, Buspar, Cymbalta  CKD (chronic kidney disease), stage IV (HCC)- (present on admission) Baseline creatinine is about 1.6-1.8. Stable.  Vaginal discharge Wet prep positive only for WBCs. GC/Chlamydia obtained and are negative; patient is low risk. Patient with prior hysterectomy.  Chronic diastolic CHF (congestive heart failure) (HCC)- (present on admission) Euvolemic on admission. Lasix held on admission.  COPD (chronic obstructive pulmonary disease) (HCC)- (present on admission) Stable. No exacerbation at this time. -Continue albuterol PRN    DVT prophylaxis: Heparin subq Code Status:   Code Status:  DNR Family Communication: None at bedside Disposition Plan: Discharge pending ID recommendations for antibiotics and workup   Consultants:  Infectious disease Orthopedic surgery  Procedures:  None  Antimicrobials: Vancomycin Meropenem    Subjective: Patient reports no issues this morning except for continued pelvic pain  Objective: BP 136/76 (BP Location: Right Arm)   Pulse 71   Temp 98.4 F (36.9 C) (Oral)   Resp 18   Ht 5\' 1"  (1.549 m)   Wt 57.8 kg   SpO2 99%   BMI 24.08 kg/m   Examination:  General exam: Appears calm and comfortable Respiratory system: Clear to auscultation. Respiratory effort normal. Cardiovascular system: S1 & S2 heard, RRR. 2/6 systolic murmur heard best near epigastrium Gastrointestinal system: Abdomen is distended, soft and non-tender generally with tenderness of pelvis. Normal bowel sounds heard. Central nervous system: Alert and oriented. No focal neurological deficits. Musculoskeletal: No edema. No calf tenderness. No focal right hip tenderness. Skin: No cyanosis. No rashes Psychiatry: Judgement and insight appear normal. Mood & affect appropriate.    Data Reviewed: I have personally reviewed following labs and imaging studies  CBC Lab Results  Component Value Date   WBC 8.4 03/17/2021   RBC 3.23 (L) 03/17/2021   HGB 9.3 (L) 03/17/2021   HCT 28.4 (L) 03/17/2021   MCV 87.9 03/17/2021   MCH 28.8 03/17/2021   PLT 440 (H) 03/17/2021   MCHC 32.7 03/17/2021   RDW 14.1 03/17/2021   LYMPHSABS 1.4 03/17/2021   MONOABS 0.5 03/17/2021   EOSABS 0.1 03/17/2021   BASOSABS 0.0 03/17/2021     Last metabolic panel Lab Results  Component Value Date   NA 137 03/17/2021   K 3.8 03/17/2021   CL 107 03/17/2021   CO2 22 03/17/2021   BUN 16 03/17/2021   CREATININE 1.58 (H) 03/17/2021   GLUCOSE 96 03/17/2021   GFRNONAA 36 (L) 03/17/2021   GFRAA  26 (L) 08/19/2019   CALCIUM 8.3 (L) 03/17/2021   PHOS 3.7 03/17/2021   PROT 6.1 (L)  03/17/2021   ALBUMIN 2.5 (L) 03/17/2021   BILITOT 0.2 (L) 03/17/2021   ALKPHOS 75 03/17/2021   AST 14 (L) 03/17/2021   ALT 11 03/17/2021   ANIONGAP 8 03/17/2021    GFR: Estimated Creatinine Clearance: 28.6 mL/min (A) (by C-G formula based on SCr of 1.58 mg/dL (H)).  Recent Results (from the past 240 hour(s))  Blood culture (routine x 2)     Status: None (Preliminary result)   Collection Time: 03/13/21 11:23 PM   Specimen: BLOOD  Result Value Ref Range Status   Specimen Description   Final    BLOOD LEFT ANTECUBITAL Performed at [facility], [address] [place], [place] [address]    Special Requests   Final    BOTTLES DRAWN AEROBIC ONLY Blood Culture adequate volume Performed at [facility], [address], [place], [place] [address]    Culture   Final    NO GROWTH 3 DAYS Performed at [facility], [address] [place], [place] [address]    Report Status PENDING  Incomplete  Blood culture (routine x 2)     Status: None (Preliminary result)   Collection Time: 03/13/21 11:46 PM   Specimen: BLOOD  Result Value Ref Range Status   Specimen Description   Final    BLOOD BLOOD RIGHT FOREARM Performed at [facility], [address], [place], [place] [address]    Special Requests   Final    BOTTLES DRAWN AEROBIC AND ANAEROBIC Blood Culture adequate volume Performed at [facility], [address] [place], [place] [address]    Culture   Final    NO GROWTH 3 DAYS Performed at [facility], [address] [place], [place] [address]    Report Status PENDING  Incomplete  Urine Culture     Status: Abnormal   Collection Time: 03/14/21  2:09 AM   Specimen: Urine, Catheterized  Result Value Ref Range Status   Specimen Description   Final    URINE, CATHETERIZED Performed at [facility], [address] [place], [place] [address]    Special Requests   Final     NONE Performed at [facility], [address] [place], [place] [address]    Culture (A)  Final    >=100,000 COLONIES/mL ESCHERICHIA COLI 60,000 COLONIES/mL PSEUDOMONAS AERUGINOSA Confirmed Extended Spectrum Beta-Lactamase Producer (ESBL).  In bloodstream infections from ESBL organisms, carbapenems are preferred over piperacillin/tazobactam. They are shown to have a lower risk of mortality.    Report Status 03/16/2021 FINAL  Final   Organism ID, Bacteria ESCHERICHIA COLI (A)  Final   Organism ID, Bacteria PSEUDOMONAS AERUGINOSA (A)  Final      Susceptibility   Escherichia coli - MIC*    AMPICILLIN >=32 RESISTANT Resistant     CEFAZOLIN >=64 RESISTANT Resistant     CEFEPIME 16 RESISTANT Resistant     CEFTRIAXONE >=64 RESISTANT Resistant     CIPROFLOXACIN >=4 RESISTANT Resistant     GENTAMICIN <=1 SENSITIVE Sensitive     IMIPENEM <=0.25 SENSITIVE Sensitive     NITROFURANTOIN <=16 SENSITIVE Sensitive     TRIMETH/SULFA >=320 RESISTANT Resistant     AMPICILLIN/SULBACTAM 8 SENSITIVE Sensitive     PIP/TAZO <=4 SENSITIVE Sensitive     * >=100,000 COLONIES/mL ESCHERICHIA COLI   Pseudomonas aeruginosa - MIC*  CEFTAZIDIME 4 SENSITIVE Sensitive     CIPROFLOXACIN 0.5 SENSITIVE Sensitive     GENTAMICIN <=1 SENSITIVE Sensitive     IMIPENEM 2 SENSITIVE Sensitive     PIP/TAZO <=4 SENSITIVE Sensitive     CEFEPIME 2 SENSITIVE Sensitive     * 60,000 COLONIES/mL PSEUDOMONAS AERUGINOSA  Resp Panel by RT-PCR (Flu A&B, Covid) Nasopharyngeal Swab     Status: None   Collection Time: 03/14/21  2:09 AM   Specimen: Nasopharyngeal Swab; Nasopharyngeal(NP) swabs in vial transport medium  Result Value Ref Range Status   SARS Coronavirus 2 by RT PCR NEGATIVE NEGATIVE Final    Comment: (NOTE) SARS-CoV-2 target nucleic acids are NOT DETECTED.  The SARS-CoV-2 RNA is generally detectable in upper respiratory specimens during the acute phase of infection. The lowest concentration of  SARS-CoV-2 viral copies this assay can detect is 138 copies/mL. A negative result does not preclude SARS-Cov-2 infection and should not be used as the sole basis for treatment or other patient management decisions. A negative result may occur with  improper specimen collection/handling, submission of specimen other than nasopharyngeal swab, presence of viral mutation(s) within the areas targeted by this assay, and inadequate number of viral copies(<138 copies/mL). A negative result must be combined with clinical observations, patient history, and epidemiological information. The expected result is Negative.  Fact Sheet for Patients:  BloggerCourse.com  Fact Sheet for Healthcare Providers:  SeriousBroker.it  This test is no t yet approved or cleared by the [place] FDA and  has been authorized for detection and/or diagnosis of SARS-CoV-2 by FDA under an Emergency Use Authorization (EUA). This EUA will remain  in effect (meaning this test can be used) for the duration of the COVID-19 declaration under Section 564(b)(1) of the Act, 21 U.S.C.section 360bbb-3(b)(1), unless the authorization is terminated  or revoked sooner.       Influenza A by PCR NEGATIVE NEGATIVE Final   Influenza B by PCR NEGATIVE NEGATIVE Final    Comment: (NOTE) The Xpert Xpress SARS-CoV-2/FLU/RSV plus assay is intended as an aid in the diagnosis of influenza from Nasopharyngeal swab specimens and should not be used as a sole basis for treatment. Nasal washings and aspirates are unacceptable for Xpert Xpress SARS-CoV-2/FLU/RSV testing.  Fact Sheet for Patients: BloggerCourse.com  Fact Sheet for Healthcare Providers: SeriousBroker.it  This test is not yet approved or cleared by the [place] FDA and has been authorized for detection and/or diagnosis of SARS-CoV-2 by FDA under an Emergency Use  Authorization (EUA). This EUA will remain in effect (meaning this test can be used) for the duration of the COVID-19 declaration under Section 564(b)(1) of the Act, 21 U.S.C. section 360bbb-3(b)(1), unless the authorization is terminated or revoked.  Performed at [facility], [address] [place], [place] [address]   Wet prep, genital     Status: Abnormal   Collection Time: 03/15/21  1:00 PM   Specimen: Vaginal  Result Value Ref Range Status   Yeast Wet Prep HPF POC NONE SEEN NONE SEEN Final   Trich, Wet Prep NONE SEEN NONE SEEN Final   Clue Cells Wet Prep HPF POC NONE SEEN NONE SEEN Final   WBC, Wet Prep HPF POC >=10 (A) <10 Final   Sperm NONE SEEN  Final    Comment: Performed at [facility], [address], [place], [place] [address]      Radiology Studies: MR PELVIS WO CONTRAST  Result Date: 03/16/2021 CLINICAL DATA:  Pelvic pain.  Postmenopausal.  Pubic pain. EXAM: MRI PELVIS WITHOUT CONTRAST TECHNIQUE: Multiplanar multisequence MR imaging of the pelvis was performed. No intravenous contrast was administered. COMPARISON:  CT scan 03/14/2021 FINDINGS: Urinary Tract:  Cystectomy with right lower quadrant urostomy. Bowel: Diffusely distended bowel noted in the pelvis with prominent stool burden in the distal colon and rectum. Vascular/Lymphatic: Unremarkable on noncontrast imaging. No evidence for pelvic sidewall lymphadenopathy. Reproductive: Nonvisualization of the uterus suggest prior hysterectomy. No discernible adnexal mass. Other:  No substantial intraperitoneal free fluid. Musculoskeletal: Diffuse body wall edema noted in the pelvis. Fixation hardware noted left femoral neck. Abnormal T2 signal is identified in both pubic bones extending into the anterior inferior pubic rami bilaterally. Despite this, there does not appear to be a substantial amount of fluid in the joint of the symphysis pubis and cortical bone is preserved along the  symphysis. This finding is associated with fairly marked abnormal T2 signal in the adductor muscles bilaterally, right greater than left suggesting edema/injury. And irregular focus of abnormal signal is identified in the right femoral neck without a linear component to suggest fracture. Prominent edema is noted in the visualized thighs bilaterally, right greater than left. IMPRESSION: 1. Abnormal T2 signal in the pubic bones extending into the anterior inferior pubic rami bilaterally. This is associated with substantial abnormal T2 signal in the adductor muscles bilaterally, right greater than left. Findings could be posttraumatic or related to chronic stress injury in the region of the pubic symphysis although in the absence of recent trauma, the diffuse abnormal signal in the adductor muscles in bony anatomy raises the question of infection/osteomyelitis. Dedicated MSK protocol pelvic MRI with and without contrast might prove helpful to further evaluate. Use of intravenous contrast should be strongly considered to further evaluate for potential soft tissue abscess. 2. Small focus of abnormal signal in the right femoral neck without a linear component to suggest fracture. Osteomyelitis not excluded. Imaging features not highly suspicious for metastatic lesion. 3. Prominent stool burden in the distal colon and rectum. Correlation for clinical constipation recommended. 4. Marked edema in the visualized portion of the thighs bilaterally, right greater than left with diffuse body wall edema in the pelvis. 5. Status post hysterectomy with right lower quadrant urostomy. Electronically Signed   By: Kennith Center M.D.   On: 03/16/2021 09:02   MR PELVIS W WO CONTRAST  Result Date: 03/17/2021 CLINICAL DATA:  Soft tissue infection of the pelvis. EXAM: MRI PELVIS WITHOUT AND WITH CONTRAST TECHNIQUE: Multiplanar multisequence MR imaging of the pelvis was performed both before and after administration of intravenous  contrast. CONTRAST:  5mL GADAVIST GADOBUTROL 1 MMOL/ML IV SOLN COMPARISON:  CT abdomen 03/14/2021 FINDINGS: Bones: Bone marrow edema in the right greater trochanter with a subtle linear component concerning for a non displaced isolated right greater trochanteric fracture. No other hip fracture, dislocation or avascular necrosis. Prior ORIF of the left hip with 3 cannulated screws. Severe bone marrow edema in the pubic bone bilaterally concerning for osteomyelitis. No symphyseal effusion. No periosteal reaction or bone destruction. No aggressive osseous lesion. Mild osteoarthritis of bilateral SI joints. Degenerative disease with disc height loss at L5-S1. broad-based disc bulges at L4-5 and L5-S1. Articular cartilage and labrum Articular cartilage: Partial-thickness cartilage loss of the femoral head and acetabulum bilaterally. Labrum: Grossly intact, but evaluation is limited by lack of intraarticular fluid. Joint or bursal effusion Joint effusion:  No hip joint effusion.  No SI joint effusion. Bursae:  No bursa formation. Muscles and tendons Flexors: Normal. Extensors: Normal.  Abductors: Normal. Adductors: Soft tissue edema and enhancement throughout the adductor musculature bilaterally concerning for myositis secondary to an infectious etiology. Gluteals: Normal. Hamstrings: Normal. Other findings No pelvic free fluid. No fluid collection or hematoma. No inguinal lymphadenopathy. No inguinal hernia. Mild anasarca. IMPRESSION: 1. Bone marrow edema in the right greater trochanter with a subtle linear component concerning for a non displaced isolated right greater trochanteric fracture. 2. Severe bone marrow edema in the pubic bone bilaterally concerning for osteomyelitis. 3. Soft tissue edema and enhancement throughout the adductor musculature bilaterally concerning for myositis secondary to an infectious etiology. Electronically Signed   By: Elige Ko M.D.   On: 03/17/2021 08:11      LOS: 3 days    Jacquelin Hawking, MD [facility] 03/17/2021, 3:33 PM   If 7PM-7AM, please contact night-coverage [email or web address]

## 2021-03-17 NOTE — Assessment & Plan Note (Addendum)
Right sided. Non-displaced. Asymptomatic.  ?Unknown mechanism injury.  ?Incidentally identified on MRI pelvis.  ?Orthopedic surgery consulted and recommended WBAT. ?No intervention recommended for now. ?

## 2021-03-17 NOTE — Progress Notes (Signed)
?   03/17/21 1400  ?Mobility  ?Activity Ambulated with assistance in hallway  ?Level of Assistance Contact guard assist, steadying assist  ?Assistive Device Other (Comment) ?(IV pole)  ?Distance Ambulated (ft) 300 ft  ?Activity Response Tolerated well  ?$Mobility charge 1 Mobility  ? ?Pt agreeable to mobilize this afternoon. Ambulated about 369ft in hall with IV pole, tolerated well. No complaints. Left pt in bed, call bell at side. RN/NT notified of session.  ? ?Rudell Cobb. ?Mobility Specialist ?Acute Rehab Services ?Office: [phone] ? ? ?

## 2021-03-17 NOTE — Consult Note (Addendum)
?   ? ? ? ? ?[facility] for Infectious Disease   ? ?Date of Admission:  03/13/2021   Total days of inpatient antibiotics 4 ? ?      ?Reason for Consult: Pelvic osteomyelitis    ?Principal Problem: ?  Infection of pelvis ([place]) ?Active Problems: ?  CKD (chronic kidney disease), stage IV ([place]) ?  COPD (chronic obstructive pulmonary disease) ([place]) ?  Chronic diastolic CHF (congestive heart failure) ([place]) ?  Depression with anxiety ?  Vaginal discharge ? ? 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 She reports she has pubic pain for the past three weeks.  ?-Imaging done due to complaint of pelvic pain.  Findings as below. ?-MR pelvis  w and w/o showed right greater trochanteric fracture, severe bone marrow edema  in b/l pubic bone concerning for osteomyelitis, concern for myositis throughout adductor musclelature b/l. ?-GC negative-vaginal swab on 03/15/21 ?-Urine Cx: Ecoli ESBL, pseudomonas aeruginosa ?-Imaging is certainly concerning for infection but given her history of of urethral cancer(treated with chemotherapy and radiation per pt) malignancy is in the differential. As such would think Bx  would she some light.  ? ?Recommendations:  ?-Consult IR for Bx of pubic bone(please obtain bacterial/fungal/afb Cx and pathology) ?-Continue meropenem and vancomycin to cover osteomyelitis.  ?-Add RPR, HIV ?Microbiology:   ?Antibiotics: ?Meropenem 2/25-p ?Pip-tazo 2/25 ?Vancomycin 2/27 ?Cultures: ?Blood ?2/25 NG ?Urine ?2/25 PsA and  Ecoli ? ? ?HPI: Shelia Chan is a 66 y.o. female with COPD, diastolic heart failure, depression, anxiety, infection with MDR organism/UTI,  ureteral cancer status post radical cystectomy, urethrectomy with ileal conduit presented with abdominal pain and fevers despite taking Keflex for UTI.  She was admitted for sepsis secondary to UTI.  On arrival to the ED patient had WBC 13 K, afebrile.  UA showed large leukocytes, positive nitrites.  She was switched over to meropenem due to her history of ESBL E. coli and Pseudomonas UTI.  She was noted to have pain in suprapubic region over pubic symphysis as such MRI with and without contrast was done.  Imaging showed bilateral pubic osteomyelitis and abductor myositis.  ID consulted for further management. ? ? ?Review of Systems: ?Review of Systems  ?All other systems reviewed and are negative. ? ?Past Medical History:  ?Diagnosis Date  ? Arthritis   ? Asthma   ? Blood transfusion without reported diagnosis   ? Cancer [facility])   ? CHF (congestive heart failure) ([place])   ? COPD (chronic obstructive pulmonary disease) ([place])   ? Heart murmur   ? Immune deficiency disorder ([place])   ? Renal disorder   ? ? ?Social History  ? ?Tobacco Use  ? Smoking status: Some Days  ?  Packs/day: 1.00  ?  Years: 50.00  ?  Pack years: 50.00  ?  Types: Cigarettes, E-cigarettes  ?  Last attempt to quit: 01/21/2019  ?  Years since quitting: 2.1  ? Smokeless tobacco: Never  ? Tobacco comments:  ?  Quit smoking but vapes occasionally  ?Vaping Use  ? Vaping Use: Never used  ?Substance Use Topics  ? Alcohol use: Not Currently  ? Drug use: Yes  ?  Types: Marijuana  ?  Comment: occasional   ? ? ?  Family History  ?Problem Relation Age of Onset  ? Heart disease Maternal Grandfather   ? ?Scheduled Meds: ? ARIPiprazole  30 mg Oral Daily  ? aspirin EC  81 mg Oral Daily  ? atorvastatin  10 mg Oral QHS  ? busPIRone  7.5 mg Oral BID  ? DULoxetine  30 mg Oral QHS  ? heparin  5,000 Units Subcutaneous Q8H  ? pantoprazole  40 mg Oral Daily  ? polyethylene glycol  17 g Oral BID  ? ?Continuous Infusions: ? meropenem (MERREM) IV 500 mg (03/16/21 1923)  ? vancomycin     ? ?PRN Meds:.acetaminophen **OR** acetaminophen, albuterol, HYDROmorphone (DILAUDID) injection, lip balm, methocarbamol, ondansetron **OR** ondansetron (ZOFRAN) IV ?Allergies  ?Allergen Reactions  ? Metoprolol Shortness Of Breath  ? ? ?OBJECTIVE: ?Blood pressure 104/78, pulse 66, temperature 98.4 ?F (36.9 ?C), temperature source Oral, resp. rate 17, height 5\' 1"  (1.549 m), weight 57.8 kg, SpO2 97 %. ? ?Physical Exam ?Constitutional:   ?   Appearance: Normal appearance.  ?HENT:  ?   Head: Normocephalic and atraumatic.  ?   Right Ear: Tympanic membrane normal.  ?   Left Ear: Tympanic membrane normal.  ?   Nose: Nose normal.  ?   Mouth/Throat:  ?   Mouth: Mucous membranes are moist.  ?Eyes:  ?   Extraocular Movements: Extraocular movements intact.  ?   Conjunctiva/sclera: Conjunctivae normal.  ?   Pupils: Pupils are equal, round, and reactive to light.  ?Cardiovascular:  ?   Rate and Rhythm: Normal rate and regular rhythm.  ?   Heart sounds: No murmur heard. ?  No friction rub. No gallop.  ?Pulmonary:  ?   Effort: Pulmonary effort is normal.  ?   Breath sounds: Normal breath sounds.  ?Abdominal:  ?   General: Abdomen is flat.  ?   Palpations: Abdomen is soft.  ?   Comments: Ilial condiut  ?Genitourinary: ?   Comments: Pubic done tender on palpation ?Musculoskeletal:     ?   General: Normal range of motion.  ?Skin: ?   General: Skin is warm and dry.  ?Neurological:  ?   General: No focal deficit present.  ?   Mental Status: She is alert and oriented to person, place, and time.  ?Psychiatric:     ?   Mood and Affect: Mood normal.  ? ? ?Lab Results ?Lab Results  ?Component Value Date  ? WBC 8.4 03/17/2021  ? HGB 9.3 (L) 03/17/2021  ? HCT 28.4 (L) 03/17/2021  ? MCV 87.9 03/17/2021  ? PLT 440 (H) 03/17/2021  ?  ?Lab Results  ?Component Value Date  ? CREATININE 1.58 (H) 03/17/2021  ? BUN 16 03/17/2021  ? NA 137 03/17/2021  ? K 3.8 03/17/2021  ? CL 107 03/17/2021  ? CO2 22 03/17/2021  ?  ?Lab Results  ?Component Value Date   ? ALT 11 03/17/2021  ? AST 14 (L) 03/17/2021  ? ALKPHOS 75 03/17/2021  ? BILITOT 0.2 (L) 03/17/2021  ?  ? ? ? ?Laurice Record, MD ?[facility] ? Medical Group ?03/17/2021, 8:35 AM  ?

## 2021-03-17 NOTE — Care Management Important Message (Signed)
Important Message ? ?Patient Details IM Letter placed in Patients room. ?Name: Shelia Chan ?MRN: [id number] ?Date of Birth: 01-Oct-1955 ? ? ?Medicare Important Message Given:  Yes ? ? ? ? ?Kerin Salen ?03/17/2021, 4:16 PM ?

## 2021-03-18 ENCOUNTER — Inpatient Hospital Stay (HOSPITAL_COMMUNITY): Payer: Medicare Other

## 2021-03-18 ENCOUNTER — Encounter (HOSPITAL_COMMUNITY): Payer: Self-pay | Admitting: Family Medicine

## 2021-03-18 DIAGNOSIS — R011 Cardiac murmur, unspecified: Secondary | ICD-10-CM

## 2021-03-18 DIAGNOSIS — M869 Osteomyelitis, unspecified: Secondary | ICD-10-CM | POA: Diagnosis not present

## 2021-03-18 DIAGNOSIS — J449 Chronic obstructive pulmonary disease, unspecified: Secondary | ICD-10-CM | POA: Diagnosis not present

## 2021-03-18 DIAGNOSIS — M8618 Other acute osteomyelitis, other site: Secondary | ICD-10-CM | POA: Diagnosis not present

## 2021-03-18 DIAGNOSIS — N184 Chronic kidney disease, stage 4 (severe): Secondary | ICD-10-CM | POA: Diagnosis not present

## 2021-03-18 LAB — ECHOCARDIOGRAM COMPLETE
AR max vel: 2.39 cm2
AV Area VTI: 2.09 cm2
AV Area mean vel: 2.12 cm2
AV Mean grad: 8 mmHg
AV Peak grad: 14.7 mmHg
Ao pk vel: 1.92 m/s
Area-P 1/2: 3.72 cm2
Calc EF: 68.2 %
Height: 61 in
P 1/2 time: 406 msec
S' Lateral: 2.3 cm
Single Plane A2C EF: 67.6 %
Single Plane A4C EF: 68.9 %
Weight: 2038.81 oz

## 2021-03-18 LAB — HIV ANTIBODY (ROUTINE TESTING W REFLEX): HIV Screen 4th Generation wRfx: NONREACTIVE

## 2021-03-18 LAB — RPR: RPR Ser Ql: NONREACTIVE

## 2021-03-18 MED ORDER — HEPARIN SODIUM (PORCINE) 5000 UNIT/ML IJ SOLN
5000.0000 [IU] | Freq: Three times a day (TID) | INTRAMUSCULAR | Status: DC
  Administered 2021-03-18 – 2021-03-24 (×16): 5000 [IU] via SUBCUTANEOUS
  Filled 2021-03-18 (×18): qty 1

## 2021-03-18 MED ORDER — SODIUM CHLORIDE 0.9 % IV SOLN
1.0000 g | Freq: Two times a day (BID) | INTRAVENOUS | Status: DC
  Administered 2021-03-18 – 2021-03-25 (×14): 1 g via INTRAVENOUS
  Filled 2021-03-18 (×2): qty 1
  Filled 2021-03-18 (×2): qty 20
  Filled 2021-03-18: qty 1
  Filled 2021-03-18 (×3): qty 20
  Filled 2021-03-18 (×2): qty 1
  Filled 2021-03-18 (×4): qty 20
  Filled 2021-03-18: qty 1
  Filled 2021-03-18: qty 20

## 2021-03-18 MED ORDER — HEPARIN SODIUM (PORCINE) 5000 UNIT/ML IJ SOLN: 5000.0000 [IU] | Freq: Three times a day (TID) | INTRAMUSCULAR | Status: DC

## 2021-03-18 NOTE — Progress Notes (Addendum)
Pharmacy Antibiotic Note ? ?Kenzie Thoreson is a 66 y.o. female admitted on 03/13/2021 with possible UTI.  MD concerned for other sources of infection such as SSTI or abscess, and would like to broaden coverage.  Pharmacy has been consulted for vancomycin dosing. ? ?Pt is now also being followed by ID, who recommend to continue vancomycin and meropenem  for osteomyelitis coverage.  ? ?Plan: ?Vancomycin 1250 mg IV x1, followed by ?Vancomycin 1000mg  IV q48 hours (eAUC 463, Scr 1.61, Vd 0.72) ?Meropenem 1000mg  IV q12 hours ?  ? ?Height: 5\' 1"  (154.9 cm) ?Weight: 57.8 kg (127 lb 6.8 oz) ?IBW/kg (Calculated) : 47.8 ? ?Temp (24hrs), Avg:98.3 ?F (36.8 ?C), Min:98 ?F (36.7 ?C), Max:98.6 ?F (37 ?C) ? ?Recent Labs  ?Lab 03/13/21 ?2323 03/14/21 ?0000 03/14/21 ?0712 03/15/21 ?0402 03/16/21 ?1975 03/17/21 ?0424  ?WBC 13.0*  --  11.6* 12.9* 11.1* 8.4  ?CREATININE 2.13*  --  1.87* 1.61* 1.94* 1.58*  ?LATICACIDVEN  --  1.1  --   --   --   --   ? ?  ?Estimated Creatinine Clearance: 28.6 mL/min (A) (by C-G formula based on SCr of 1.58 mg/dL (H)).   ? ?Allergies  ?Allergen Reactions  ? Metoprolol Shortness Of Breath  ? ? ?Antimicrobials this admission:  ?2/26 Zosyn x1 ?2/26 Meropenem >>  ?2/27 Vancomycin >> ?  ?Dose adjustments this admission: ?2/26 Mero 1 gm q12>>500 q12  ? ?Microbiology results:  ?2/25 BCx2: ngtd ?2/26 UCx: > 100K E coli ( R to Amp, cefazolin, cefepime, CTX, cipro, Sulfa, Sens to gent, imipenem, nitrofurantoin, Zosyn,) 60 K PsA ( pansens)  ? ? ? ?Royetta Asal, PharmD, BCPS ?03/18/2021 11:27 AM ? ? ?

## 2021-03-18 NOTE — Progress Notes (Signed)
?   ? ? ? ? ?[facility] ? ?Date of Admission:  03/13/2021   Total days of inpatient antibiotics 5 ? ?Principal Problem: ?  Infection of pelvis ([place]) ?Active Problems: ?  CKD (chronic kidney disease), stage IV ([place]) ?  COPD (chronic obstructive pulmonary disease) ([place]) ?  Chronic diastolic CHF (congestive heart failure) ([place]) ?  Depression with anxiety ?  Vaginal discharge ?  Greater trochanter fracture ([place]) ?     ?    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 She reports she has pubic pain for the past three weeks.  ?-Imaging done due to complaint of pelvic pain.  Findings as below. ?-MR pelvis  w and w/o showed right greater trochanteric fracture, severe bone marrow edema  in b/l pubic bone concerning for osteomyelitis, concern for myositis throughout adductor musclelature b/l. ?-GC negative-vaginal swab on 03/15/21, HIV and RPR NR ?-Urine Cx: Ecoli ESBL, pseudomonas aeruginosa ?-Imaging is certainly concerning for infection but given her history of of urethral cancer(treated with chemotherapy and radiation per pt) malignancy is in the differential. As such would think Bx  would she some light.  ?  ?Recommendations:  ?-Consult IR for Bx of pubic bone(please obtain bacterial/fungal/afb Cx and pathology/cytology) ?-Continue meropenem and vancomycin to cover osteomyelitis.  ?-Pt continues to yellow discharge. Consider Gyn consult ?Microbiology:   ?Antibiotics: ?Meropenem 2/25-p ?Pip-tazo 2/25 ?Vancomycin 2/27 ?Cultures: ?Blood ?2/25 NG ?Urine ?2/25 PsA and  Ecoli ? ? ?SUBJECTIVE: ?None complaints. No significant overnight events.  ? ?Review  of Systems: ?Review of Systems  ?All other systems reviewed and are negative. ? ? ?Scheduled Meds: ? ARIPiprazole  30 mg Oral Daily  ? aspirin EC  81 mg Oral Daily  ? atorvastatin  10 mg Oral QHS  ? busPIRone  7.5 mg Oral BID  ? DULoxetine  30 mg Oral QHS  ? heparin  5,000 Units Subcutaneous Q8H  ? pantoprazole  40 mg Oral Daily  ? polyethylene glycol  17 g Oral BID  ? ?Continuous Infusions: ? meropenem (MERREM) IV    ? vancomycin 1,000 mg (03/17/21 1834)  ? ?PRN Meds:.acetaminophen **OR** acetaminophen, albuterol, HYDROmorphone (DILAUDID) injection, lip balm, methocarbamol, ondansetron **OR** ondansetron (ZOFRAN) IV ?Allergies  ?Allergen Reactions  ? Metoprolol Shortness Of Breath  ? ? ?OBJECTIVE: ?Vitals:  ? 03/17/21 0507 03/17/21 1328 03/17/21 2130 03/18/21 0450  ?BP: 104/78 136/76 114/66 103/71  ?Pulse: 66 71 65 60  ?Resp: 17 18 16 14   ?Temp: 98.4 ?F (36.9 ?C) 98.4 ?F (36.9 ?C) 98.6 ?F (37 ?C) 98 ?F (36.7 ?C)  ?TempSrc: Oral Oral Oral Oral  ?SpO2: 97% 99% 98% 99%  ?Weight:      ?Height:      ? ?Body mass index is 24.08 kg/m?. ? ?Physical Exam ?Constitutional:   ?   Appearance: Normal appearance.  ?HENT:  ?   Head: Normocephalic and atraumatic.  ?   Right Ear: Tympanic membrane normal.  ?   Left Ear: Tympanic membrane normal.  ?   Nose: Nose normal.  ?   Mouth/Throat:  ?   Mouth: Mucous membranes are moist.  ?Eyes:  ?   Extraocular Movements: Extraocular movements intact.  ?   Conjunctiva/sclera: Conjunctivae  normal.  ?   Pupils: Pupils are equal, round, and reactive to light.  ?Cardiovascular:  ?   Rate and Rhythm: Normal rate and regular rhythm.  ?   Heart sounds: No murmur heard. ?  No friction rub. No gallop.  ?Pulmonary:  ?   Effort: Pulmonary effort is normal.  ?   Breath sounds: Normal breath sounds.  ?Abdominal:  ?   General: Abdomen is flat.  ?   Palpations: Abdomen is soft.  ?Genitourinary: ?   Comments: RLQ urostomy ?Musculoskeletal:     ?   General: Normal range of motion.  ?Skin: ?   General: Skin is  warm and dry.  ?Neurological:  ?   General: No focal deficit present.  ?   Mental Status: She is alert and oriented to person, place, and time.  ?Psychiatric:     ?   Mood and Affect: Mood normal.  ? ? ? ? ?Lab Results ?Lab Results  ?Component Value Date  ? WBC 8.4 03/17/2021  ? HGB 9.3 (L) 03/17/2021  ? HCT 28.4 (L) 03/17/2021  ? MCV 87.9 03/17/2021  ? PLT 440 (H) 03/17/2021  ?  ?Lab Results  ?Component Value Date  ? CREATININE 1.58 (H) 03/17/2021  ? BUN 16 03/17/2021  ? NA 137 03/17/2021  ? K 3.8 03/17/2021  ? CL 107 03/17/2021  ? CO2 22 03/17/2021  ?  ?Lab Results  ?Component Value Date  ? ALT 11 03/17/2021  ? AST 14 (L) 03/17/2021  ? ALKPHOS 75 03/17/2021  ? BILITOT 0.2 (L) 03/17/2021  ?  ? ? ? ? ?Laurice Record, MD ?[facility] ?[facility] ?03/18/2021, 12:01 PM  ?

## 2021-03-18 NOTE — Progress Notes (Signed)
PROGRESS NOTE    Shelia Chan  KVQ:[id number] DOB: [birth year] DOA: 03/13/2021 PCP: [name], MD   Brief Narrative: 66 yo with hx urethral cancer s/p radical cystectomy, urethrectomy, and ileal conduit, COPD, HFpEF, depression/anxiety who presents with fevers and abdominal pain concerning for UTI.  Notably with history of pseudomonas and ESBL in the past. MRI this admission significant for possible osteomyelitis. Infectious disease consulted. Incidentally, patient found to have a likely non-displaced right greater trochanteric fracture with orthopedic surgery clearing for weight bearing as tolerated. Plan for bone biopsy.   Assessment and Plan: * Infection of pelvis ([place]) MRI concerning for osteomyelitis. Infectious disease consulted and have recommended IR consult for biopsy and cultures since patient has a history of urethral cancer. Patient also with recurrent positive urine culture, complicated by ileal conduit history. E. Coli (MDR) and Pseudomonas are recurrent pathogens. Blood cultures with no growth. -Continue meropenem and Vancomycin -Follow-up biopsy results -Continued ID recommendations -Transthoracic Echocardiogram secondary to murmur  Greater trochanter fracture (HCC) Unknown mechanism injury. Incidentally identified on MRI pelvis. Non-displaced. Asymptomatic. -Consult orthopedic surgery for recommendations and follow-up  Vaginal discharge Wet prep positive only for WBCs. GC/Chlamydia obtained and are negative; patient is low risk. Patient with prior hysterectomy.  Depression with anxiety -Continue home Abilify, Buspar, Cymbalta  Chronic diastolic CHF (congestive heart failure) (HCC) Euvolemic on admission. Lasix held on admission.  COPD (chronic obstructive pulmonary disease) (HCC) Stable. No exacerbation at this time. -Continue albuterol PRN  CKD (chronic kidney disease), stage IV (HCC) Baseline creatinine is about 1.6-1.8. Stable.    DVT  prophylaxis: Heparin subq Code Status:   Code Status: DNR Family Communication: None at bedside Disposition Plan: Discharge pending ID recommendations for antibiotics and workup   Consultants:  Infectious disease Orthopedic surgery  Procedures:  None  Antimicrobials: Vancomycin Meropenem    Subjective: Feeling much better today. Was able to ambulate yesterday.  Objective: BP 121/69 (BP Location: Right Arm)    Pulse 64    Temp 98.5 F (36.9 C) (Oral)    Resp 18    Ht 5\' 1"  (1.549 m)    Wt 57.8 kg    SpO2 100%    BMI 24.08 kg/m   Examination:  General exam: Appears calm and comfortable Respiratory system: Clear to auscultation. Respiratory effort normal. Cardiovascular system: S1 & S2 heard, RRR. 2/6 systolic murmur Gastrointestinal system: Abdomen is nondistended, soft and nontender. No organomegaly or masses felt. Normal bowel sounds heard. Central nervous system: Alert and oriented. No focal neurological deficits. Musculoskeletal: BLE edema. No calf tenderness Skin: No cyanosis. No rashes Psychiatry: Judgement and insight appear normal. Mood & affect appropriate.    Data Reviewed: I have personally reviewed following labs and imaging studies  CBC Lab Results  Component Value Date   WBC 8.4 03/17/2021   RBC 3.23 (L) 03/17/2021   HGB 9.3 (L) 03/17/2021   HCT 28.4 (L) 03/17/2021   MCV 87.9 03/17/2021   MCH 28.8 03/17/2021   PLT 440 (H) 03/17/2021   MCHC 32.7 03/17/2021   RDW 14.1 03/17/2021   LYMPHSABS 1.4 03/17/2021   MONOABS 0.5 03/17/2021   EOSABS 0.1 03/17/2021   BASOSABS 0.0 64/33/2951     Last metabolic panel Lab Results  Component Value Date   NA 137 03/17/2021   K 3.8 03/17/2021   CL 107 03/17/2021   CO2 22 03/17/2021   BUN 16 03/17/2021   CREATININE 1.58 (H) 03/17/2021   GLUCOSE 96 03/17/2021   GFRNONAA 36 (L)  03/17/2021   GFRAA 26 (L) 08/19/2019   CALCIUM 8.3 (L) 03/17/2021   PHOS 3.7 03/17/2021   PROT 6.1 (L) 03/17/2021   ALBUMIN 2.5  (L) 03/17/2021   BILITOT 0.2 (L) 03/17/2021   ALKPHOS 75 03/17/2021   AST 14 (L) 03/17/2021   ALT 11 03/17/2021   ANIONGAP 8 03/17/2021    GFR: Estimated Creatinine Clearance: 28.6 mL/min (A) (by C-G formula based on SCr of 1.58 mg/dL (H)).  Recent Results (from the past 240 hour(s))  Blood culture (routine x 2)     Status: None (Preliminary result)   Collection Time: 03/13/21 11:23 PM   Specimen: BLOOD  Result Value Ref Range Status   Specimen Description   Final    BLOOD LEFT ANTECUBITAL Performed at [facility] [address] [place], [place] [address]    Special Requests   Final    BOTTLES DRAWN AEROBIC ONLY Blood Culture adequate volume Performed at [facility] [address], [place], [place] [address]    Culture   Final    NO GROWTH 4 DAYS Performed at [facility], [place] [address] [place], [place] [address]    Report Status PENDING  Incomplete  Blood culture (routine x 2)     Status: None (Preliminary result)   Collection Time: 03/13/21 11:46 PM   Specimen: BLOOD  Result Value Ref Range Status   Specimen Description   Final    BLOOD BLOOD RIGHT FOREARM Performed at [facility] [address] [place], [place] [address]    Special Requests   Final    BOTTLES DRAWN AEROBIC AND ANAEROBIC Blood Culture adequate volume Performed at [facility] [address], [place], [place] [address]    Culture   Final    NO GROWTH 4 DAYS Performed at [facility], [place] [address], [place], [place] [address]    Report Status PENDING  Incomplete  Urine Culture     Status: Abnormal   Collection Time: 03/14/21  2:09 AM   Specimen: Urine, Catheterized  Result Value Ref Range Status   Specimen Description   Final    URINE, CATHETERIZED Performed at [facility] [address], [place], [place] [address]    Special Requests   Final    NONE Performed at [facility], [place] [address], [place], [place] [address]    Culture (A)  Final    >=100,000 COLONIES/mL ESCHERICHIA COLI 60,000 COLONIES/mL PSEUDOMONAS AERUGINOSA Confirmed Extended Spectrum Beta-Lactamase Producer (ESBL).  In bloodstream infections from ESBL organisms, carbapenems are preferred over piperacillin/tazobactam. They are shown to have a lower risk of mortality.    Report Status 03/16/2021 FINAL  Final   Organism ID, Bacteria ESCHERICHIA COLI (A)  Final   Organism ID, Bacteria PSEUDOMONAS AERUGINOSA (A)  Final      Susceptibility   Escherichia coli - MIC*    AMPICILLIN >=32 RESISTANT Resistant     CEFAZOLIN >=64 RESISTANT Resistant     CEFEPIME 16 RESISTANT Resistant     CEFTRIAXONE >=64 RESISTANT Resistant     CIPROFLOXACIN >=4 RESISTANT Resistant     GENTAMICIN <=1 SENSITIVE Sensitive     IMIPENEM <=0.25 SENSITIVE Sensitive     NITROFURANTOIN <=16 SENSITIVE Sensitive     TRIMETH/SULFA >=320 RESISTANT Resistant     AMPICILLIN/SULBACTAM 8 SENSITIVE Sensitive     PIP/TAZO <=4 SENSITIVE Sensitive     * >=100,000 COLONIES/mL ESCHERICHIA COLI  Pseudomonas aeruginosa - MIC*    CEFTAZIDIME 4 SENSITIVE Sensitive     CIPROFLOXACIN 0.5 SENSITIVE Sensitive     GENTAMICIN <=1 SENSITIVE Sensitive     IMIPENEM 2 SENSITIVE Sensitive     PIP/TAZO <=4 SENSITIVE Sensitive     CEFEPIME 2 SENSITIVE Sensitive     * 60,000 COLONIES/mL PSEUDOMONAS AERUGINOSA  Resp Panel by RT-PCR (Flu A&B, Covid) Nasopharyngeal Swab     Status: None   Collection Time: 03/14/21  2:09 AM   Specimen: Nasopharyngeal Swab; Nasopharyngeal(NP) swabs in vial transport medium  Result Value Ref Range Status   SARS Coronavirus 2 by RT PCR NEGATIVE NEGATIVE Final    Comment: (NOTE) SARS-CoV-2 target nucleic acids are NOT DETECTED.  The SARS-CoV-2 RNA is generally detectable in upper respiratory specimens during the acute phase of infection. The lowest concentration of SARS-CoV-2 viral copies this assay  can detect is 138 copies/mL. A negative result does not preclude SARS-Cov-2 infection and should not be used as the sole basis for treatment or other patient management decisions. A negative result may occur with  improper specimen collection/handling, submission of specimen other than nasopharyngeal swab, presence of viral mutation(s) within the areas targeted by this assay, and inadequate number of viral copies(<138 copies/mL). A negative result must be combined with clinical observations, patient history, and epidemiological information. The expected result is Negative.  Fact Sheet for Patients:  EntrepreneurPulse.com.au  Fact Sheet for Healthcare Providers:  IncredibleEmployment.be  This test is no t yet approved or cleared by the [place] FDA and  has been authorized for detection and/or diagnosis of SARS-CoV-2 by FDA under an Emergency Use Authorization (EUA). This EUA will remain  in effect (meaning this test can be used) for the duration of the COVID-19 declaration under Section 564(b)(1) of the Act, 21 U.S.C.section 360bbb-3(b)(1), unless the authorization is terminated  or revoked sooner.       Influenza A by PCR NEGATIVE NEGATIVE Final   Influenza B by PCR NEGATIVE NEGATIVE Final    Comment: (NOTE) The Xpert Xpress SARS-CoV-2/FLU/RSV plus assay is intended as an aid in the diagnosis of influenza from Nasopharyngeal swab specimens and should not be used as a sole basis for treatment. Nasal washings and aspirates are unacceptable for Xpert Xpress SARS-CoV-2/FLU/RSV testing.  Fact Sheet for Patients: EntrepreneurPulse.com.au  Fact Sheet for Healthcare Providers: IncredibleEmployment.be  This test is not yet approved or cleared by the [place] FDA and has been authorized for detection and/or diagnosis of SARS-CoV-2 by FDA under an Emergency Use Authorization (EUA). This EUA will  remain in effect (meaning this test can be used) for the duration of the COVID-19 declaration under Section 564(b)(1) of the Act, 21 U.S.C. section 360bbb-3(b)(1), unless the authorization is terminated or revoked.  Performed at [facility], [place] [address], [place], [place] [address]   Wet prep, genital     Status: Abnormal   Collection Time: 03/15/21  1:00 PM   Specimen: Vaginal  Result Value Ref Range Status   Yeast Wet Prep HPF POC NONE SEEN NONE SEEN Final   Trich, Wet Prep NONE SEEN NONE SEEN Final   Clue Cells Wet Prep HPF POC NONE SEEN NONE SEEN Final   WBC, Wet Prep HPF POC >=10 (A) <10 Final   Sperm NONE SEEN  Final    Comment: Performed at [facility], [place] [address] [place], [place] [address]      Radiology Studies: MR PELVIS W WO CONTRAST  Result Date:  03/17/2021 CLINICAL DATA:  Soft tissue infection of the pelvis. EXAM: MRI PELVIS WITHOUT AND WITH CONTRAST TECHNIQUE: Multiplanar multisequence MR imaging of the pelvis was performed both before and after administration of intravenous contrast. CONTRAST:  35mL GADAVIST GADOBUTROL 1 MMOL/ML IV SOLN COMPARISON:  CT abdomen 03/14/2021 FINDINGS: Bones: Bone marrow edema in the right greater trochanter with a subtle linear component concerning for a non displaced isolated right greater trochanteric fracture. No other hip fracture, dislocation or avascular necrosis. Prior ORIF of the left hip with 3 cannulated screws. Severe bone marrow edema in the pubic bone bilaterally concerning for osteomyelitis. No symphyseal effusion. No periosteal reaction or bone destruction. No aggressive osseous lesion. Mild osteoarthritis of bilateral SI joints. Degenerative disease with disc height loss at L5-S1. broad-based disc bulges at L4-5 and L5-S1. Articular cartilage and labrum Articular cartilage: Partial-thickness cartilage loss of the femoral head and acetabulum bilaterally. Labrum: Grossly intact, but  evaluation is limited by lack of intraarticular fluid. Joint or bursal effusion Joint effusion:  No hip joint effusion.  No SI joint effusion. Bursae:  No bursa formation. Muscles and tendons Flexors: Normal. Extensors: Normal. Abductors: Normal. Adductors: Soft tissue edema and enhancement throughout the adductor musculature bilaterally concerning for myositis secondary to an infectious etiology. Gluteals: Normal. Hamstrings: Normal. Other findings No pelvic free fluid. No fluid collection or hematoma. No inguinal lymphadenopathy. No inguinal hernia. Mild anasarca. IMPRESSION: 1. Bone marrow edema in the right greater trochanter with a subtle linear component concerning for a non displaced isolated right greater trochanteric fracture. 2. Severe bone marrow edema in the pubic bone bilaterally concerning for osteomyelitis. 3. Soft tissue edema and enhancement throughout the adductor musculature bilaterally concerning for myositis secondary to an infectious etiology. Electronically Signed   By: Kathreen Devoid M.D.   On: 03/17/2021 08:11      LOS: 4 days    Cordelia Poche, MD [facility] 03/18/2021, 2:09 PM   If 7PM-7AM, please contact night-coverage [email or web address]

## 2021-03-18 NOTE — Progress Notes (Signed)
?  Echocardiogram ?2D Echocardiogram has been performed. ? ?Shelia Chan ?03/18/2021, 3:19 PM ?

## 2021-03-18 NOTE — Progress Notes (Signed)
?   03/18/21 1353  ?Mobility  ?Activity Ambulated with assistance in hallway  ?Level of Assistance Modified independent, requires aide device or extra time  ?Assistive Device Other (Comment) ?(handheld)  ?Distance Ambulated (ft) 360 ft  ?Activity Response Tolerated well  ?$Mobility charge 1 Mobility  ? ?Pt agreeable to mobilize this afternoon. Ambulated about 333ft in hall with handheld assist, tolerated well. Pt stated she was experiencing 5/10 pelvic pain, but insisted on continuing with session. Left pt in bed, call bell at side. RN/NT notified of session.  ? ?Rudell Cobb. ?Mobility Specialist ?Acute Rehab Services ?Office: [phone] ? ? ?

## 2021-03-18 NOTE — Progress Notes (Signed)
Pharmacy Antibiotic Note ? ?Shelia Chan is a 66 y.o. female admitted on 03/13/2021 with UTI and imaging concerning for osteo. Pharmacy consulted for Meropenem and Vancomycin dosing.  ? ?Plan: ?Continue Vancomycin 1g IV every 48h ?Meropenem adjusted to 1g IV every 12 hours ?F/u ability to narrow antibiotics ?Monitor renal function and culture data  ? ?Height: 5\' 1"  (154.9 cm) ?Weight: 57.8 kg (127 lb 6.8 oz) ?IBW/kg (Calculated) : 47.8 ? ?Temp (24hrs), Avg:98.3 ?F (36.8 ?C), Min:98 ?F (36.7 ?C), Max:98.6 ?F (37 ?C) ? ?Recent Labs  ?Lab 03/13/21 ?2323 03/14/21 ?0000 03/14/21 ?4496 03/15/21 ?0402 03/16/21 ?7591 03/17/21 ?0424  ?WBC 13.0*  --  11.6* 12.9* 11.1* 8.4  ?CREATININE 2.13*  --  1.87* 1.61* 1.94* 1.58*  ?LATICACIDVEN  --  1.1  --   --   --   --   ? ?  ?Estimated Creatinine Clearance: 28.6 mL/min (A) (by C-G formula based on SCr of 1.58 mg/dL (H)).   ? ?Allergies  ?Allergen Reactions  ? Metoprolol Shortness Of Breath  ? ? ?Antimicrobials this admission:  ?2/26 Zosyn x1 ?2/26 Meropenem >>  ?2/27 Vancomycin >> ?  ?Dose adjustments this admission: ?2/26 Mero 1 gm q12>>500 q12  ? ?Microbiology results:  ?2/25 BCx2: ngtd ?2/26 UCx: > 100K E coli, 60 K PsA ? ?Thank you for allowing pharmacy to be a part of this patient?s care. ? ?Alycia Rossetti, PharmD, BCPS ?Infectious Diseases Clinical Pharmacist ?03/19/2021 7:25 AM  ? ?**Pharmacist phone directory can now be found on [email or web address] ([email or web address]).  Listed under [place].  ? ?

## 2021-03-18 NOTE — H&P (Signed)
Chief Complaint: Patient was seen in consultation today for CT guided pubic bone biopsy  at the request of Shelia Poche, MD  Referring Physician(s): Shelia Poche, MD  Supervising Physician: Sandi Mariscal  Patient Status: [name] - In-pt  History of Present Illness: Shelia Chan is a 66 y.o. female w/ PMH of urethral cancer s/p radical cystectomy, urethrectomy, ileal conduit, COPD, CHF, CKD stage IV and depression w/ anxiety. Pt presented to ED 03/13/21 c/o fevers and lower abdominal pain. MR pelvis 03/16/21 found bone marrow edema in pubic bone concerning for osteomyelitis. Pt was referred by Dr. Cordelia Chan to IR for pubic bone biopsy. Procedure was approved by Dr. Pascal Lux, IR.   IMPRESSION: 1. Bone marrow edema in the right greater trochanter with a subtle linear component concerning for a non displaced isolated right greater trochanteric fracture. 2. Severe bone marrow edema in the pubic bone bilaterally concerning for osteomyelitis. 3. Soft tissue edema and enhancement throughout the adductor musculature bilaterally concerning for myositis secondary to an infectious etiology.   Past Medical History:  Diagnosis Date   Arthritis    Asthma    Blood transfusion without reported diagnosis    Cancer ([place])    CHF (congestive heart failure) (H[facility])    COPD (chronic obstructive pulmonary disease) (H[facility])    Heart murmur    Immune deficiency disorder ([place])    Renal disorder     Past Surgical History:  Procedure Laterality Date   BLADDER REMOVAL  118/22/2015   FRACTURE SURGERY Left 02/2018   pins   IR CATHETER TUBE CHANGE  04/15/2019   IR CATHETER TUBE CHANGE  04/15/2019   IR EXT NEPHROURETERAL CATH EXCHANGE  07/15/2019   IR EXT NEPHROURETERAL CATH EXCHANGE  10/14/2019   IR EXT NEPHROURETERAL CATH EXCHANGE  10/14/2019   IR EXT NEPHROURETERAL CATH EXCHANGE  01/06/2020   IR EXT NEPHROURETERAL CATH EXCHANGE  03/16/2020   IR EXT NEPHROURETERAL CATH EXCHANGE  05/25/2020   IR EXT  NEPHROURETERAL CATH EXCHANGE  07/17/2020   IR EXT NEPHROURETERAL CATH EXCHANGE  07/17/2020   IR EXT NEPHROURETERAL CATH EXCHANGE  08/14/2020   IR EXT NEPHROURETERAL CATH EXCHANGE  09/25/2020   IR EXT NEPHROURETERAL CATH EXCHANGE  11/06/2020   IR EXT NEPHROURETERAL CATH EXCHANGE  12/25/2020   IR EXT NEPHROURETERAL CATH EXCHANGE  02/09/2021    Allergies: Metoprolol  Medications: Prior to Admission medications   Medication Sig Start Date End Date Taking? Authorizing Provider  acetaminophen (TYLENOL) 500 MG tablet Take 500-1,000 mg by mouth every 6 (six) hours as needed for moderate pain or fever.   Yes [provider]  albuterol (VENTOLIN HFA) 108 (90 Base) MCG/ACT inhaler Inhale 1 puff into the lungs every 6 (six) hours as needed for shortness of breath. 05/22/20  Yes [provider]  ARIPiprazole (ABILIFY) 30 MG tablet Take 30 mg by mouth daily. 03/19/19  Yes [provider]  aspirin EC 81 MG tablet Take 81 mg by mouth daily.   Yes [provider]  atorvastatin (LIPITOR) 10 MG tablet Take 10 mg by mouth at bedtime. 02/07/21  Yes [provider]  B Complex-C (B-COMPLEX WITH VITAMIN C) tablet Take 1 tablet by mouth daily.   Yes [provider]  busPIRone (BUSPAR) 7.5 MG tablet Take 7.5 mg by mouth 2 (two) times daily. 02/22/21  Yes [provider]  calcitRIOL (ROCALTROL) 0.25 MCG capsule Take 0.25 mcg by mouth daily. 02/12/21  Yes [provider]  Cholecalciferol (VITAMIN D-3) 25 MCG (1000 UT)  CAPS Take 2,000 Units by mouth daily.   Yes [provider]  DULoxetine (CYMBALTA) 30 MG capsule Take 30 mg by mouth at bedtime. 06/21/20  Yes [provider]  ferrous sulfate 324 MG TBEC Take 324 mg by mouth daily with breakfast.   Yes [provider]  furosemide (LASIX) 40 MG tablet Take 40 mg by mouth daily.   Yes [provider]  KLOR-CON M20 20 MEQ tablet TAKE 1 TABLET BY MOUTH EVERY DAY Patient taking  differently: Take 20 mEq by mouth daily. 10/28/20  Yes O'Neal, Cassie Freer, MD  methocarbamol (ROBAXIN) 500 MG tablet Take 500 mg by mouth 3 (three) times daily as needed for muscle spasms. 03/08/21  Yes [provider]  Multiple Vitamin (MULTIVITAMIN ADULT PO) Take 1 tablet by mouth daily.   Yes [provider]  pantoprazole (PROTONIX) 40 MG tablet Take 40 mg by mouth daily. 01/28/21  Yes [provider]  Probiotic Product (PROBIOTIC PO) Take 1 capsule by mouth daily.   Yes [provider]  vitamin C (ASCORBIC ACID) 500 MG tablet Take 500 mg by mouth daily.   Yes [provider]     Family History  Problem Relation Age of Onset   Heart disease Maternal Grandfather     Social History   Socioeconomic History   Marital status: Divorced    Spouse name: Not on file   Number of children: Not on file   Years of education: Not on file   Highest education level: Not on file  Occupational History   Occupation: disabled  Tobacco Use   Smoking status: Some Days    Packs/day: 1.00    Years: 50.00    Pack years: 50.00    Types: Cigarettes, E-cigarettes    Last attempt to quit: 01/21/2019    Years since quitting: 2.1   Smokeless tobacco: Never   Tobacco comments:    Quit smoking but vapes occasionally  Vaping Use   Vaping Use: Never used  Substance and Sexual Activity   Alcohol use: Not Currently   Drug use: Yes    Types: Marijuana    Comment: occasional    Sexual activity: Not on file  Other Topics Concern   Not on file  Social History Narrative   Not on file   Social Determinants of Health   Financial Resource Strain: Not on file  Food Insecurity: Not on file  Transportation Needs: Not on file  Physical Activity: Not on file  Stress: Not on file  Social Connections: Not on file   Review of Systems: A 12 point ROS discussed and pertinent positives are indicated in the HPI above.  All other systems are negative.  Review of Systems   Genitourinary:  Positive for pelvic pain.   Vital Signs: BP 103/71 (BP Location: Right Arm)    Pulse 60    Temp 98 F (36.7 C) (Oral)    Resp 14    Ht 5\' 1"  (1.549 m)    Wt 127 lb 6.8 oz (57.8 kg)    SpO2 99%    BMI 24.08 kg/m   Physical Exam Constitutional:      Appearance: Normal appearance. She is not ill-appearing.  HENT:     Head: Normocephalic and atraumatic.     Mouth/Throat:     Mouth: Mucous membranes are moist.     Pharynx: Oropharynx is clear.  Eyes:     Extraocular Movements: Extraocular movements intact.     Pupils: Pupils are  equal, round, and reactive to light.  Cardiovascular:     Rate and Rhythm: Normal rate and regular rhythm.     Pulses: Normal pulses.     Heart sounds: Normal heart sounds.  Pulmonary:     Effort: Pulmonary effort is normal. No respiratory distress.     Breath sounds: Normal breath sounds. No stridor. No wheezing, rhonchi or rales.  Abdominal:     General: There is no distension.     Tenderness: There is abdominal tenderness. There is guarding.     Comments: Pain and tenderness to pelvic bone   Musculoskeletal:     Right lower leg: Edema present.     Left lower leg: Edema present.  Skin:    General: Skin is warm and dry.  Neurological:     Mental Status: She is alert and oriented to person, place, and time.  Psychiatric:        Mood and Affect: Mood normal.        Behavior: Behavior normal.        Thought Content: Thought content normal.        Judgment: Judgment normal.    Imaging: CT ABDOMEN PELVIS WO CONTRAST  Result Date: 03/14/2021 CLINICAL DATA:  Lower abdominal pain EXAM: CT ABDOMEN AND PELVIS WITHOUT CONTRAST TECHNIQUE: Multidetector CT imaging of the abdomen and pelvis was performed following the standard protocol without IV contrast. RADIATION DOSE REDUCTION: This exam was performed according to the departmental dose-optimization program which includes automated exposure control, adjustment of the mA and/or kV according to  patient size and/or use of iterative reconstruction technique. COMPARISON:  11/08/2020 FINDINGS: Lower chest: No acute abnormality. Hepatobiliary: No focal hepatic abnormality. Gallbladder unremarkable. Pancreas: No focal abnormality or ductal dilatation. Spleen: No focal abnormality.  Normal size. Adrenals/Urinary Tract: Prior cystectomy with right lower quadrant urostomy. Ureteral stents extend through the urostomy and into the renal pelvis bilaterally. Left renal atrophy. No hydronephrosis. Adrenal glands unremarkable. Stomach/Bowel: No bowel obstruction. Stomach, large and small bowel grossly unremarkable. Moderate stool burden throughout the colon. Vascular/Lymphatic: Aortic atherosclerosis. No evidence of aneurysm or adenopathy. Reproductive: Prior hysterectomy.  No adnexal masses. Other: No free fluid or free air. Musculoskeletal: No acute bony abnormality. IMPRESSION: Changes of cystectomy with right lower quadrant urostomy. Ureteral stents remain in place. No hydronephrosis. Moderate stool burden in the colon. No acute findings. Aortic atherosclerosis. Electronically Signed   By: Rolm Baptise M.D.   On: 03/14/2021 01:31   MR PELVIS WO CONTRAST  Result Date: 03/16/2021 CLINICAL DATA:  Pelvic pain.  Postmenopausal.  Pubic pain. EXAM: MRI PELVIS WITHOUT CONTRAST TECHNIQUE: Multiplanar multisequence MR imaging of the pelvis was performed. No intravenous contrast was administered. COMPARISON:  CT scan 03/14/2021 FINDINGS: Urinary Tract:  Cystectomy with right lower quadrant urostomy. Bowel: Diffusely distended bowel noted in the pelvis with prominent stool burden in the distal colon and rectum. Vascular/Lymphatic: Unremarkable on noncontrast imaging. No evidence for pelvic sidewall lymphadenopathy. Reproductive: Nonvisualization of the uterus suggest prior hysterectomy. No discernible adnexal mass. Other:  No substantial intraperitoneal free fluid. Musculoskeletal: Diffuse body wall edema noted in the  pelvis. Fixation hardware noted left femoral neck. Abnormal T2 signal is identified in both pubic bones extending into the anterior inferior pubic rami bilaterally. Despite this, there does not appear to be a substantial amount of fluid in the joint of the symphysis pubis and cortical bone is preserved along the symphysis. This finding is associated with fairly marked abnormal T2 signal in the adductor muscles bilaterally, right  greater than left suggesting edema/injury. And irregular focus of abnormal signal is identified in the right femoral neck without a linear component to suggest fracture. Prominent edema is noted in the visualized thighs bilaterally, right greater than left. IMPRESSION: 1. Abnormal T2 signal in the pubic bones extending into the anterior inferior pubic rami bilaterally. This is associated with substantial abnormal T2 signal in the adductor muscles bilaterally, right greater than left. Findings could be posttraumatic or related to chronic stress injury in the region of the pubic symphysis although in the absence of recent trauma, the diffuse abnormal signal in the adductor muscles in bony anatomy raises the question of infection/osteomyelitis. Dedicated MSK protocol pelvic MRI with and without contrast might prove helpful to further evaluate. Use of intravenous contrast should be strongly considered to further evaluate for potential soft tissue abscess. 2. Small focus of abnormal signal in the right femoral neck without a linear component to suggest fracture. Osteomyelitis not excluded. Imaging features not highly suspicious for metastatic lesion. 3. Prominent stool burden in the distal colon and rectum. Correlation for clinical constipation recommended. 4. Marked edema in the visualized portion of the thighs bilaterally, right greater than left with diffuse body wall edema in the pelvis. 5. Status post hysterectomy with right lower quadrant urostomy. Electronically Signed   By: Misty Stanley  M.D.   On: 03/16/2021 09:02   MR PELVIS W WO CONTRAST  Result Date: 03/17/2021 CLINICAL DATA:  Soft tissue infection of the pelvis. EXAM: MRI PELVIS WITHOUT AND WITH CONTRAST TECHNIQUE: Multiplanar multisequence MR imaging of the pelvis was performed both before and after administration of intravenous contrast. CONTRAST:  80mL GADAVIST GADOBUTROL 1 MMOL/ML IV SOLN COMPARISON:  CT abdomen 03/14/2021 FINDINGS: Bones: Bone marrow edema in the right greater trochanter with a subtle linear component concerning for a non displaced isolated right greater trochanteric fracture. No other hip fracture, dislocation or avascular necrosis. Prior ORIF of the left hip with 3 cannulated screws. Severe bone marrow edema in the pubic bone bilaterally concerning for osteomyelitis. No symphyseal effusion. No periosteal reaction or bone destruction. No aggressive osseous lesion. Mild osteoarthritis of bilateral SI joints. Degenerative disease with disc height loss at L5-S1. broad-based disc bulges at L4-5 and L5-S1. Articular cartilage and labrum Articular cartilage: Partial-thickness cartilage loss of the femoral head and acetabulum bilaterally. Labrum: Grossly intact, but evaluation is limited by lack of intraarticular fluid. Joint or bursal effusion Joint effusion:  No hip joint effusion.  No SI joint effusion. Bursae:  No bursa formation. Muscles and tendons Flexors: Normal. Extensors: Normal. Abductors: Normal. Adductors: Soft tissue edema and enhancement throughout the adductor musculature bilaterally concerning for myositis secondary to an infectious etiology. Gluteals: Normal. Hamstrings: Normal. Other findings No pelvic free fluid. No fluid collection or hematoma. No inguinal lymphadenopathy. No inguinal hernia. Mild anasarca. IMPRESSION: 1. Bone marrow edema in the right greater trochanter with a subtle linear component concerning for a non displaced isolated right greater trochanteric fracture. 2. Severe bone marrow edema  in the pubic bone bilaterally concerning for osteomyelitis. 3. Soft tissue edema and enhancement throughout the adductor musculature bilaterally concerning for myositis secondary to an infectious etiology. Electronically Signed   By: Kathreen Devoid M.D.   On: 03/17/2021 08:11    Labs:  CBC: Recent Labs    03/14/21 0439 03/15/21 0402 03/16/21 0412 03/17/21 0424  WBC 11.6* 12.9* 11.1* 8.4  HGB 10.4* 10.0* 9.6* 9.3*  HCT 33.2* 31.5* 30.0* 28.4*  PLT 367 397 433* 440*  COAGS: No results for input(s): INR, APTT in the last 8760 hours.  BMP: Recent Labs    03/14/21 0439 03/15/21 0402 03/16/21 0412 03/17/21 0424  NA 135 135 135 137  K 3.8 3.9 3.5 3.8  CL 102 104 105 107  CO2 23 25 23 22   GLUCOSE 95 103* 98 96  BUN 27* 22 22 16   CALCIUM 8.1* 8.4* 8.5* 8.3*  CREATININE 1.87* 1.61* 1.94* 1.58*  GFRNONAA 29* 35* 28* 36*    LIVER FUNCTION TESTS: Recent Labs    11/08/20 1223 03/13/21 2323 03/17/21 0424  BILITOT 0.6 1.0 0.2*  AST 17 18 14*  ALT 12 14 11   ALKPHOS 102 101 75  PROT 8.0 8.2* 6.1*  ALBUMIN 3.9 3.7 2.5*    TUMOR MARKERS: No results for input(s): AFPTM, CEA, CA199, CHROMGRNA in the last 8760 hours.  Assessment and Plan: History of urethral cancer s/p radical cystectomy, urethrectomy, ileal conduit, COPD, CHF, CKD stage IV and depression w/ anxiety. Pt presented to ED 03/13/21 c/o fevers and lower abdominal pain. MR pelvis 03/16/21 found bone marrow edema in pubic bone concerning for osteomyelitis. Pt was referred by Dr. Cordelia Chan to IR for pubic bone biopsy. Procedure was approved by Dr. Pascal Lux, IR.   IMPRESSION: 1. Bone marrow edema in the right greater trochanter with a subtle linear component concerning for a non displaced isolated right greater trochanteric fracture. 2. Severe bone marrow edema in the pubic bone bilaterally concerning for osteomyelitis. 3. Soft tissue edema and enhancement throughout the adductor musculature bilaterally concerning for  myositis secondary to an infectious etiology.  Pt resting in bed. She is A&O, calm and pleasant.  She is in no distress.  Pt requests that she have her bilateral PCNs exchanged while she is hospitalized.  Pt is NPO and aware if procedure is tomorrow, she must be NPO after MN.  1400 heparin dose held for possible procedure today.  VSS  Risks and benefits of pubic bone biopsy was discussed with the patient and/or patient's family including, but not limited to bleeding, infection, damage to adjacent structures or low yield requiring additional tests.  All of the questions were answered and there is agreement to proceed.  Consent signed and in chart.   Thank you for this interesting consult.  I greatly enjoyed meeting Aubre Shorb and look forward to participating in their care.  A copy of this report was sent to the requesting provider on this date.  Electronically Signed: Tyson Alias, NP 03/18/2021, 10:59 AM   I spent a total of 20 minutes in face to face in clinical consultation, greater than 50% of which was counseling/coordinating care for CT guided pubic bone biopsy for pelvic osteomyelitis.

## 2021-03-19 ENCOUNTER — Inpatient Hospital Stay (HOSPITAL_COMMUNITY): Payer: Medicare Other

## 2021-03-19 DIAGNOSIS — N898 Other specified noninflammatory disorders of vagina: Secondary | ICD-10-CM

## 2021-03-19 DIAGNOSIS — I5032 Chronic diastolic (congestive) heart failure: Secondary | ICD-10-CM | POA: Diagnosis not present

## 2021-03-19 DIAGNOSIS — M869 Osteomyelitis, unspecified: Secondary | ICD-10-CM | POA: Diagnosis not present

## 2021-03-19 DIAGNOSIS — N184 Chronic kidney disease, stage 4 (severe): Secondary | ICD-10-CM | POA: Diagnosis not present

## 2021-03-19 DIAGNOSIS — M8618 Other acute osteomyelitis, other site: Secondary | ICD-10-CM | POA: Diagnosis not present

## 2021-03-19 DIAGNOSIS — J449 Chronic obstructive pulmonary disease, unspecified: Secondary | ICD-10-CM | POA: Diagnosis not present

## 2021-03-19 HISTORY — PX: IR EXT NEPHROURETERAL CATH EXCHANGE: IMG5418

## 2021-03-19 LAB — CULTURE, BLOOD (ROUTINE X 2)
Culture: NO GROWTH
Culture: NO GROWTH
Special Requests: ADEQUATE
Special Requests: ADEQUATE

## 2021-03-19 MED ORDER — FUROSEMIDE 40 MG PO TABS
40.0000 mg | ORAL_TABLET | Freq: Every day | ORAL | Status: DC
  Administered 2021-03-19 – 2021-03-24 (×6): 40 mg via ORAL
  Filled 2021-03-19 (×6): qty 1

## 2021-03-19 MED ORDER — FENTANYL CITRATE (PF) 100 MCG/2ML IJ SOLN
INTRAMUSCULAR | Status: AC
  Filled 2021-03-19: qty 2

## 2021-03-19 MED ORDER — SODIUM CHLORIDE 0.9 % IV SOLN
INTRAVENOUS | Status: AC
  Filled 2021-03-19: qty 250

## 2021-03-19 MED ORDER — IOHEXOL 300 MG/ML  SOLN
50.0000 mL | Freq: Once | INTRAMUSCULAR | Status: AC | PRN
  Administered 2021-03-19: 15 mL

## 2021-03-19 MED ORDER — NALOXONE HCL 0.4 MG/ML IJ SOLN
INTRAMUSCULAR | Status: AC
  Filled 2021-03-19: qty 1

## 2021-03-19 MED ORDER — FENTANYL CITRATE (PF) 100 MCG/2ML IJ SOLN
INTRAMUSCULAR | Status: DC | PRN
Start: 2021-03-19 — End: 2021-03-20
  Administered 2021-03-19 (×2): 50 ug via INTRAVENOUS

## 2021-03-19 MED ORDER — LIDOCAINE HCL (PF) 1 % IJ SOLN
INTRAMUSCULAR | Status: DC | PRN
  Administered 2021-03-19: 10 mL

## 2021-03-19 MED ORDER — MIDAZOLAM HCL 2 MG/2ML IJ SOLN
INTRAMUSCULAR | Status: AC
  Filled 2021-03-19: qty 4

## 2021-03-19 MED ORDER — MIDAZOLAM HCL 2 MG/2ML IJ SOLN
INTRAMUSCULAR | Status: DC | PRN
  Administered 2021-03-19 (×2): 1 mg via INTRAVENOUS

## 2021-03-19 MED ORDER — FLUMAZENIL 0.5 MG/5ML IV SOLN
INTRAVENOUS | Status: AC
  Filled 2021-03-19: qty 5

## 2021-03-19 NOTE — Procedures (Signed)
Interventional Radiology Procedure Note ? ?Procedure: CT guided bone biopsy ? ?Findings: Please refer to procedural dictation for full description. Core bx of left pubic bone. ? ?Complications: None immediate ? ?Estimated Blood Loss:  < 5 mL ? ?Recommendations: ?2 hour bedrest ?Follow up Pathology results. ? ? ?Ruthann Cancer, MD ?Pager: [phone] ? ? ? ?

## 2021-03-19 NOTE — Progress Notes (Signed)
PROGRESS NOTE    Shelia Chan  VFI:[id number] DOB: 1955/07/28 DOA: 03/13/2021 PCP: Ellyn Hack, MD   Brief Narrative: 66 yo with hx urethral cancer s/p radical cystectomy, urethrectomy, and ileal conduit, COPD, HFpEF, depression/anxiety who presents with fevers and abdominal pain concerning for UTI.  Notably with history of pseudomonas and ESBL in the past. MRI this admission significant for possible osteomyelitis. Infectious disease consulted. Incidentally, patient found to have a likely non-displaced right greater trochanteric fracture with orthopedic surgery clearing for weight bearing as tolerated. Plan for bone biopsy.   Assessment and Plan: * Infection of pelvis (HCC) MRI concerning for osteomyelitis. Infectious disease consulted and have recommended IR consult for biopsy and cultures since patient has a history of urethral cancer. Patient also with recurrent positive urine culture, complicated by ileal conduit history. E. Coli (MDR) and Pseudomonas are recurrent pathogens. Blood cultures with no growth. Transthoracic Echocardiogram with stable valve pathologies. -Continue meropenem and Vancomycin -Follow-up biopsy results -Continued ID recommendations  Greater trochanter fracture (HCC) Right sided. Unknown mechanism injury. Incidentally identified on MRI pelvis. Non-displaced. Asymptomatic. Orthopedic surgery consulted and recommended WBAT.  Vaginal discharge Wet prep positive only for WBCs. GC/Chlamydia obtained and are negative; patient is low risk. Patient with prior hysterectomy.  Depression with anxiety -Continue home Abilify, Buspar, Cymbalta  Chronic diastolic CHF (congestive heart failure) (HCC) Euvolemic on admission. Lasix held on admission. Transthoracic Echocardiogram stable. -Resume home Lasix 40 mg daily  COPD (chronic obstructive pulmonary disease) (HCC) Stable. No exacerbation at this time. -Continue albuterol PRN  CKD (chronic kidney disease), stage  IV (HCC) Baseline creatinine is about 1.6-1.8. Stable.    DVT prophylaxis: Heparin subq Code Status:   Code Status: DNR Family Communication: None at bedside Disposition Plan: Discharge pending IR biopsy in addition to ID recommendations for antibiotics and workup   Consultants:  Infectious disease Orthopedic surgery  Procedures:  None  Antimicrobials: Vancomycin Meropenem    Subjective: No issues noted overnight.  Objective: BP 117/68    Pulse 63    Temp 98.1 F (36.7 C) (Oral)    Resp 18    Ht 5\' 1"  (1.549 m)    Wt 57.8 kg    SpO2 97%    BMI 24.08 kg/m   Examination:  General: Well appearing, no distress   Data Reviewed: I have personally reviewed following labs and imaging studies  CBC Lab Results  Component Value Date   WBC 8.4 03/17/2021   RBC 3.23 (L) 03/17/2021   HGB 9.3 (L) 03/17/2021   HCT 28.4 (L) 03/17/2021   MCV 87.9 03/17/2021   MCH 28.8 03/17/2021   PLT 440 (H) 03/17/2021   MCHC 32.7 03/17/2021   RDW 14.1 03/17/2021   LYMPHSABS 1.4 03/17/2021   MONOABS 0.5 03/17/2021   EOSABS 0.1 03/17/2021   BASOSABS 0.0 03/17/2021     Last metabolic panel Lab Results  Component Value Date   NA 137 03/17/2021   K 3.8 03/17/2021   CL 107 03/17/2021   CO2 22 03/17/2021   BUN 16 03/17/2021   CREATININE 1.58 (H) 03/17/2021   GLUCOSE 96 03/17/2021   GFRNONAA 36 (L) 03/17/2021   GFRAA 26 (L) 08/19/2019   CALCIUM 8.3 (L) 03/17/2021   PHOS 3.7 03/17/2021   PROT 6.1 (L) 03/17/2021   ALBUMIN 2.5 (L) 03/17/2021   BILITOT 0.2 (L) 03/17/2021   ALKPHOS 75 03/17/2021   AST 14 (L) 03/17/2021   ALT 11 03/17/2021   ANIONGAP 8 03/17/2021    GFR:  Estimated Creatinine Clearance: 28.6 mL/min (A) (by C-G formula based on SCr of 1.58 mg/dL (H)).  Recent Results (from the past 240 hour(s))  Blood culture (routine x 2)     Status: None   Collection Time: 03/13/21 11:23 PM   Specimen: BLOOD  Result Value Ref Range Status   Specimen Description   Final     BLOOD LEFT ANTECUBITAL Performed at [facility], [address] [place], [place] [address]    Special Requests   Final    BOTTLES DRAWN AEROBIC ONLY Blood Culture adequate volume Performed at [facility], [address], [place], [place] [address]    Culture   Final    NO GROWTH 5 DAYS Performed at [facility], [address] [place], [place] [address]    Report Status 03/19/2021 FINAL  Final  Blood culture (routine x 2)     Status: None   Collection Time: 03/13/21 11:46 PM   Specimen: BLOOD  Result Value Ref Range Status   Specimen Description   Final    BLOOD BLOOD RIGHT FOREARM Performed at [facility], [address] [place], [place] [address]    Special Requests   Final    BOTTLES DRAWN AEROBIC AND ANAEROBIC Blood Culture adequate volume Performed at [facility], [address] [place], [place] [address]    Culture   Final    NO GROWTH 5 DAYS Performed at [facility], [address] [place], [place] [address]    Report Status 03/19/2021 FINAL  Final  Urine Culture     Status: Abnormal   Collection Time: 03/14/21  2:09 AM   Specimen: Urine, Catheterized  Result Value Ref Range Status   Specimen Description   Final    URINE, CATHETERIZED Performed at [facility], [address] [place], [place] [address]    Special Requests   Final    NONE Performed at [facility], [address], [place], [place] [address]    Culture (A)  Final    >=100,000 COLONIES/mL ESCHERICHIA COLI 60,000 COLONIES/mL PSEUDOMONAS AERUGINOSA Confirmed Extended Spectrum Beta-Lactamase Producer (ESBL).  In bloodstream infections from ESBL organisms, carbapenems are preferred over piperacillin/tazobactam. They are shown to have a lower risk of mortality.    Report Status 03/16/2021 FINAL  Final   Organism ID, Bacteria ESCHERICHIA COLI (A)  Final   Organism ID,  Bacteria PSEUDOMONAS AERUGINOSA (A)  Final      Susceptibility   Escherichia coli - MIC*    AMPICILLIN >=32 RESISTANT Resistant     CEFAZOLIN >=64 RESISTANT Resistant     CEFEPIME 16 RESISTANT Resistant     CEFTRIAXONE >=64 RESISTANT Resistant     CIPROFLOXACIN >=4 RESISTANT Resistant     GENTAMICIN <=1 SENSITIVE Sensitive     IMIPENEM <=0.25 SENSITIVE Sensitive     NITROFURANTOIN <=16 SENSITIVE Sensitive     TRIMETH/SULFA >=320 RESISTANT Resistant     AMPICILLIN/SULBACTAM 8 SENSITIVE Sensitive     PIP/TAZO <=4 SENSITIVE Sensitive     * >=100,000 COLONIES/mL ESCHERICHIA COLI   Pseudomonas aeruginosa - MIC*    CEFTAZIDIME 4 SENSITIVE Sensitive     CIPROFLOXACIN 0.5 SENSITIVE Sensitive     GENTAMICIN <=1 SENSITIVE Sensitive     IMIPENEM 2 SENSITIVE Sensitive     PIP/TAZO <=4 SENSITIVE Sensitive     CEFEPIME 2 SENSITIVE Sensitive     * 60,000 COLONIES/mL PSEUDOMONAS AERUGINOSA  Resp Panel  by RT-PCR (Flu A&B, Covid) Nasopharyngeal Swab     Status: None   Collection Time: 03/14/21  2:09 AM   Specimen: Nasopharyngeal Swab; Nasopharyngeal(NP) swabs in vial transport medium  Result Value Ref Range Status   SARS Coronavirus 2 by RT PCR NEGATIVE NEGATIVE Final    Comment: (NOTE) SARS-CoV-2 target nucleic acids are NOT DETECTED.  The SARS-CoV-2 RNA is generally detectable in upper respiratory specimens during the acute phase of infection. The lowest concentration of SARS-CoV-2 viral copies this assay can detect is 138 copies/mL. A negative result does not preclude SARS-Cov-2 infection and should not be used as the sole basis for treatment or other patient management decisions. A negative result may occur with  improper specimen collection/handling, submission of specimen other than nasopharyngeal swab, presence of viral mutation(s) within the areas targeted by this assay, and inadequate number of viral copies(<138 copies/mL). A negative result must be combined with clinical  observations, patient history, and epidemiological information. The expected result is Negative.  Fact Sheet for Patients:  BloggerCourse.com  Fact Sheet for Healthcare Providers:  SeriousBroker.it  This test is no t yet approved or cleared by the [place] FDA and  has been authorized for detection and/or diagnosis of SARS-CoV-2 by FDA under an Emergency Use Authorization (EUA). This EUA will remain  in effect (meaning this test can be used) for the duration of the COVID-19 declaration under Section 564(b)(1) of the Act, 21 U.S.C.section 360bbb-3(b)(1), unless the authorization is terminated  or revoked sooner.       Influenza A by PCR NEGATIVE NEGATIVE Final   Influenza B by PCR NEGATIVE NEGATIVE Final    Comment: (NOTE) The Xpert Xpress SARS-CoV-2/FLU/RSV plus assay is intended as an aid in the diagnosis of influenza from Nasopharyngeal swab specimens and should not be used as a sole basis for treatment. Nasal washings and aspirates are unacceptable for Xpert Xpress SARS-CoV-2/FLU/RSV testing.  Fact Sheet for Patients: BloggerCourse.com  Fact Sheet for Healthcare Providers: SeriousBroker.it  This test is not yet approved or cleared by the [place] FDA and has been authorized for detection and/or diagnosis of SARS-CoV-2 by FDA under an Emergency Use Authorization (EUA). This EUA will remain in effect (meaning this test can be used) for the duration of the COVID-19 declaration under Section 564(b)(1) of the Act, 21 U.S.C. section 360bbb-3(b)(1), unless the authorization is terminated or revoked.  Performed at [facility], [address] [place], [place] [address]   Wet prep, genital     Status: Abnormal   Collection Time: 03/15/21  1:00 PM   Specimen: Vaginal  Result Value Ref Range Status   Yeast Wet Prep HPF POC NONE SEEN NONE SEEN Final    Trich, Wet Prep NONE SEEN NONE SEEN Final   Clue Cells Wet Prep HPF POC NONE SEEN NONE SEEN Final   WBC, Wet Prep HPF POC >=10 (A) <10 Final   Sperm NONE SEEN  Final    Comment: Performed at [facility], [address], [place], [place] [address]      Radiology Studies: ECHOCARDIOGRAM COMPLETE  Result Date: 03/18/2021    ECHOCARDIOGRAM REPORT   Patient Name:   Shelia Chan Date of Exam: 03/18/2021 Medical Rec #:  [id number]      Height:       61.0 in Accession #:    [id number]     Weight:       127.4 lb Date of Birth:  1955-01-24  BSA:          1.559 m Patient Age:    65 years       BP:           103/71 mmHg Patient Gender: F              HR:           59 bpm. Exam Location:  Inpatient Procedure: 2D Echo, 3D Echo, Cardiac Doppler and Color Doppler Indications:    R01.1 Murmur. Osteomyelitis  History:        Patient has prior history of Echocardiogram examinations, most                 recent 06/21/2019. CHF, COPD, Signs/Symptoms:Murmur; Risk                 Factors:Current Smoker. Cancer.  Sonographer:    Sheralyn Boatman RDCS Referring Phys: [phone] Balraj Brayfield A Allison Silva  Sonographer Comments: Suboptimal parasternal window. IMPRESSIONS  1. Left ventricular ejection fraction, by estimation, is 65 to 70%. Left ventricular ejection fraction by 2D MOD biplane is 68.2 %. The left ventricle has normal function. The left ventricle has no regional wall motion abnormalities. There is mild left ventricular hypertrophy. Left ventricular diastolic parameters are consistent with Grade I diastolic dysfunction (impaired relaxation).  2. Right ventricular systolic function is normal. The right ventricular size is normal. There is normal pulmonary artery systolic pressure. The estimated right ventricular systolic pressure is 34.2 mmHg.  3. The mitral valve is grossly normal. Trivial mitral valve regurgitation.  4. The aortic valve is abnormal. There is moderate thickening of the aortic valve. Aortic valve  regurgitation is mild to moderate.  5. The inferior vena cava is dilated in size with <50% respiratory variability, suggesting right atrial pressure of 15 mmHg. Comparison(s): Changes from prior study are noted. 06/21/2019: LVEF 60-65%, mild AI. Conclusion(s)/Recommendation(s): No evidence of valvular vegetations on this transthoracic echocardiogram. Consider a transesophageal echocardiogram to exclude infective endocarditis if clinically indicated. FINDINGS  Left Ventricle: Left ventricular ejection fraction, by estimation, is 65 to 70%. Left ventricular ejection fraction by 2D MOD biplane is 68.2 %. The left ventricle has normal function. The left ventricle has no regional wall motion abnormalities. The left ventricular internal cavity size was normal in size. There is mild left ventricular hypertrophy. Left ventricular diastolic parameters are consistent with Grade I diastolic dysfunction (impaired relaxation). Normal left ventricular filling pressure. Right Ventricle: The right ventricular size is normal. No increase in right ventricular wall thickness. Right ventricular systolic function is normal. There is normal pulmonary artery systolic pressure. The tricuspid regurgitant velocity is 2.19 m/s, and  with an assumed right atrial pressure of 15 mmHg, the estimated right ventricular systolic pressure is 34.2 mmHg. Left Atrium: Left atrial size was normal in size. Right Atrium: Right atrial size was normal in size. Pericardium: There is no evidence of pericardial effusion. Mitral Valve: The mitral valve is grossly normal. Trivial mitral valve regurgitation. Tricuspid Valve: The tricuspid valve is grossly normal. Tricuspid valve regurgitation is mild. Aortic Valve: The aortic valve is abnormal. There is moderate thickening of the aortic valve. Aortic valve regurgitation is mild to moderate. Aortic regurgitation PHT measures 406 msec. Aortic valve mean gradient measures 8.0 mmHg. Aortic valve peak gradient measures  14.7 mmHg. Aortic valve area, by VTI measures 2.09 cm. Pulmonic Valve: The pulmonic valve was normal in structure. Pulmonic valve regurgitation is not visualized. Aorta: The aortic root and ascending aorta are structurally normal, with  no evidence of dilitation. Venous: The inferior vena cava is dilated in size with less than 50% respiratory variability, suggesting right atrial pressure of 15 mmHg. IAS/Shunts: No atrial level shunt detected by color flow Doppler.  LEFT VENTRICLE PLAX 2D                        Biplane EF (MOD) LVIDd:         3.30 cm         LV Biplane EF:   Left LVIDs:         2.30 cm                          ventricular LV PW:         1.30 cm                          ejection LV IVS:        1.20 cm                          fraction by LVOT diam:     1.90 cm                          2D MOD LV SV:         86                               biplane is LV SV Index:   55                               68.2 %. LVOT Area:     2.84 cm                                Diastology                                LV e' medial:    9.25 cm/s LV Volumes (MOD)               LV E/e' medial:  10.1 LV vol d, MOD    65.7 ml       LV e' lateral:   16.60 cm/s A2C:                           LV E/e' lateral: 5.6 LV vol d, MOD    94.2 ml A4C: LV vol s, MOD    21.3 ml A2C: LV vol s, MOD    29.3 ml A4C: LV SV MOD A2C:   44.4 ml LV SV MOD A4C:   94.2 ml LV SV MOD BP:    55.8 ml RIGHT VENTRICLE             IVC RV S prime:     14.00 cm/s  IVC diam: 2.10 cm TAPSE (M-mode): 2.6 cm LEFT ATRIUM             Index        RIGHT ATRIUM           Index LA diam:  2.70 cm 1.73 cm/m   RA Area:     12.30 cm LA Vol (A2C):   24.9 ml 15.97 ml/m  RA Volume:   26.70 ml  17.12 ml/m LA Vol (A4C):   47.9 ml 30.72 ml/m LA Biplane Vol: 35.3 ml 22.64 ml/m  AORTIC VALVE AV Area (Vmax):    2.39 cm AV Area (Vmean):   2.12 cm AV Area (VTI):     2.09 cm AV Vmax:           192.00 cm/s AV Vmean:          130.000 cm/s AV VTI:            0.412 m AV  Peak Grad:      14.7 mmHg AV Mean Grad:      8.0 mmHg LVOT Vmax:         161.67 cm/s LVOT Vmean:        97.050 cm/s LVOT VTI:          0.304 m LVOT/AV VTI ratio: 0.74 AI PHT:            406 msec  AORTA Ao Root diam: 3.30 cm Ao Asc diam:  3.10 cm Ao Desc diam: 2.70 cm MITRAL VALVE               TRICUSPID VALVE MV Area (PHT): 3.72 cm    TR Peak grad:   19.2 mmHg MV Decel Time: 204 msec    TR Vmax:        219.00 cm/s MV E velocity: 93.00 cm/s MV A velocity: 85.70 cm/s  SHUNTS MV E/A ratio:  1.09        Systemic VTI:  0.30 m                            Systemic Diam: 1.90 cm Zoila Shutter MD Electronically signed by Zoila Shutter MD Signature Date/Time: 03/18/2021/3:30:56 PM    Final       LOS: 5 days    Jacquelin Hawking, MD [facility] 03/19/2021, 2:23 PM   If 7PM-7AM, please contact night-coverage [email or web address]

## 2021-03-19 NOTE — Progress Notes (Addendum)
?   ? ? ? ? ?[place] for Infectious Disease ? ?Date of Admission:  03/13/2021   Total days of inpatient antibiotics 5 ? ?Principal Problem: ?  Infection of pelvis ([place]) ?Active Problems: ?  CKD (chronic kidney disease), stage IV ([place]) ?  COPD (chronic obstructive pulmonary disease) ([place]) ?  Chronic diastolic CHF (congestive heart failure) ([place]) ?  Depression with anxiety ?  Vaginal discharge ?  Greater trochanter fracture ([place]) ?     ?    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 She reports she has pubic pain for the past three weeks.  ?-Imaging done due to complaint of pelvic pain.  Findings as below. ?-MR pelvis  w and w/o showed right greater trochanteric fracture, severe bone marrow edema  in b/l pubic bone concerning for osteomyelitis, concern for myositis throughout adductor musclelature b/l. ?-GC negative-vaginal swab on 03/15/21, HIV and RPR NR ?-Urine Cx: Ecoli ESBL, pseudomonas aeruginosa ?-Imaging is certainly concerning for infection but given her history of of urethral cancer(treated with chemotherapy and radiation per pt) malignancy is in the differential. As such recommend biopsy.  ?  ?Recommendations:  ?-IR consulted for Bx of pubic bone. Please obtain bacterial/fungal/afb Cx and cytology. ?-Continue meropenem and vancomycin to cover osteomyelitis.  ?-Consider Gyn consult for vaginal discharge ?Microbiology:   ?Antibiotics: ?Meropenem 2/25-p ?Pip-tazo 2/25 ?Vancomycin 2/27 ?Cultures: ?Blood ?2/25 NG ?Urine ?2/25 PsA and  Ecoli ? ? ?SUBJECTIVE: ?Family at bedside. No new complaints.  ? ?Review of Systems: ?Review of Systems  ?All other systems  reviewed and are negative. ? ? ?Scheduled Meds: ? ARIPiprazole  30 mg Oral Daily  ? aspirin EC  81 mg Oral Daily  ? atorvastatin  10 mg Oral QHS  ? busPIRone  7.5 mg Oral BID  ? DULoxetine  30 mg Oral QHS  ? fentaNYL      ? flumazenil      ? furosemide  40 mg Oral Daily  ? heparin  5,000 Units Subcutaneous Q8H  ? midazolam      ? naloxone      ? pantoprazole  40 mg Oral Daily  ? polyethylene glycol  17 g Oral BID  ? ?Continuous Infusions: ? meropenem (MERREM) IV Stopped (03/19/21 2536)  ? sodium chloride    ? vancomycin 1,000 mg (03/17/21 1834)  ? ?PRN Meds:.acetaminophen **OR** acetaminophen, albuterol, fentaNYL, HYDROmorphone (DILAUDID) injection, lip balm, methocarbamol, midazolam, ondansetron **OR** ondansetron (ZOFRAN) IV ?Allergies  ?Allergen Reactions  ? Metoprolol Shortness Of Breath  ? ? ?OBJECTIVE: ?Vitals:  ? 03/18/21 2123 03/19/21 0528 03/19/21 1420 03/19/21 1620  ?BP: 109/74 125/68 117/68 120/63  ?Pulse: 69 71 63 69  ?Resp: 16 16 18 14   ?Temp: 99 ?F (37.2 ?C) 98.8 ?F (37.1 ?C) 98.1 ?F (36.7 ?C)   ?TempSrc: Oral Oral Oral   ?SpO2: 98% 97% 97% 100%  ?Weight:      ?Height:      ? ?Body mass index is 24.08 kg/m?. ? ?Physical Exam ?Constitutional:   ?   Appearance: Normal appearance.  ?HENT:  ?   Head: Normocephalic and atraumatic.  ?   Right Ear: Tympanic membrane normal.  ?   Left Ear: Tympanic membrane normal.  ?  Nose: Nose normal.  ?   Mouth/Throat:  ?   Mouth: Mucous membranes are moist.  ?Eyes:  ?   Extraocular Movements: Extraocular movements intact.  ?   Conjunctiva/sclera: Conjunctivae normal.  ?   Pupils: Pupils are equal, round, and reactive to light.  ?Cardiovascular:  ?   Rate and Rhythm: Normal rate and regular rhythm.  ?   Heart sounds: No murmur heard. ?  No friction rub. No gallop.  ?Pulmonary:  ?   Effort: Pulmonary effort is normal.  ?   Breath sounds: Normal breath sounds.  ?Abdominal:  ?   General: Abdomen is flat.  ?   Palpations: Abdomen is soft.  ?Genitourinary: ?   Comments: RLQ  urostomy ?Musculoskeletal:     ?   General: Normal range of motion.  ?Skin: ?   General: Skin is warm and dry.  ?Neurological:  ?   General: No focal deficit present.  ?   Mental Status: She is alert and oriented to person, place, and time.  ?Psychiatric:     ?   Mood and Affect: Mood normal.  ? ? ? ? ?Lab Results ?Lab Results  ?Component Value Date  ? WBC 8.4 03/17/2021  ? HGB 9.3 (L) 03/17/2021  ? HCT 28.4 (L) 03/17/2021  ? MCV 87.9 03/17/2021  ? PLT 440 (H) 03/17/2021  ?  ?Lab Results  ?Component Value Date  ? CREATININE 1.58 (H) 03/17/2021  ? BUN 16 03/17/2021  ? NA 137 03/17/2021  ? K 3.8 03/17/2021  ? CL 107 03/17/2021  ? CO2 22 03/17/2021  ?  ?Lab Results  ?Component Value Date  ? ALT 11 03/17/2021  ? AST 14 (L) 03/17/2021  ? ALKPHOS 75 03/17/2021  ? BILITOT 0.2 (L) 03/17/2021  ?  ? ? ? ? ?Laurice Record, MD ?[facility] ?[facility] ?03/19/2021, 4:36 PM  ?

## 2021-03-20 DIAGNOSIS — S72114A Nondisplaced fracture of greater trochanter of right femur, initial encounter for closed fracture: Secondary | ICD-10-CM

## 2021-03-20 LAB — BASIC METABOLIC PANEL
Anion gap: 10 (ref 5–15)
BUN: 15 mg/dL (ref 8–23)
CO2: 22 mmol/L (ref 22–32)
Calcium: 8.2 mg/dL — ABNORMAL LOW (ref 8.9–10.3)
Chloride: 101 mmol/L (ref 98–111)
Creatinine, Ser: 1.67 mg/dL — ABNORMAL HIGH (ref 0.44–1.00)
GFR, Estimated: 34 mL/min — ABNORMAL LOW (ref >60)
Glucose, Bld: 119 mg/dL — ABNORMAL HIGH (ref 70–99)
Potassium: 3.5 mmol/L (ref 3.5–5.1)
Sodium: 133 mmol/L — ABNORMAL LOW (ref 135–145)

## 2021-03-20 LAB — CBC
HCT: 31.4 % — ABNORMAL LOW (ref 36.0–46.0)
Hemoglobin: 9.8 g/dL — ABNORMAL LOW (ref 12.0–15.0)
MCH: 28.8 pg (ref 26.0–34.0)
MCHC: 31.2 g/dL (ref 30.0–36.0)
MCV: 92.4 fL (ref 80.0–100.0)
Platelets: 541 10*3/uL — ABNORMAL HIGH (ref 150–400)
RBC: 3.4 MIL/uL — ABNORMAL LOW (ref 3.87–5.11)
RDW: 14.2 % (ref 11.5–15.5)
WBC: 7.6 10*3/uL (ref 4.0–10.5)
nRBC: 0 % (ref 0.0–0.2)

## 2021-03-20 MED ORDER — HYDROCODONE-ACETAMINOPHEN 5-325 MG PO TABS
1.0000 | ORAL_TABLET | ORAL | Status: DC | PRN
  Administered 2021-03-21 – 2021-03-22 (×3): 2 via ORAL
  Administered 2021-03-23: 1 via ORAL
  Administered 2021-03-23 – 2021-03-24 (×3): 2 via ORAL
  Administered 2021-03-24 (×2): 1 via ORAL
  Filled 2021-03-20 (×2): qty 2
  Filled 2021-03-20: qty 1
  Filled 2021-03-20: qty 2
  Filled 2021-03-20: qty 1
  Filled 2021-03-20: qty 2
  Filled 2021-03-20: qty 1
  Filled 2021-03-20 (×2): qty 2

## 2021-03-20 MED ORDER — HYDROMORPHONE HCL 1 MG/ML IJ SOLN
0.5000 mg | INTRAMUSCULAR | Status: DC | PRN
  Administered 2021-03-20: 0.5 mg via INTRAVENOUS

## 2021-03-20 NOTE — Progress Notes (Signed)
PROGRESS NOTE    Shelia Chan  GYI:[id number] DOB: 08-Nov-1955 DOA: 03/13/2021 PCP: [name], MD   Brief Narrative: 66 yo with hx urethral cancer s/p radical cystectomy, urethrectomy, and ileal conduit, COPD, HFpEF, depression/anxiety who presents with fevers and abdominal pain concerning for UTI.  Notably with history of pseudomonas and ESBL in the past. MRI this admission significant for possible osteomyelitis. Infectious disease consulted. Incidentally, patient found to have a likely non-displaced right greater trochanteric fracture with orthopedic surgery clearing for weight bearing as tolerated. Bone biopsy performed on 3/3.   Assessment and Plan: * Infection of pelvis ([place]) MRI concerning for osteomyelitis. Infectious disease consulted and have recommended IR consult for biopsy and cultures since patient has a history of urethral cancer. Patient also with recurrent positive urine culture, complicated by ileal conduit history. E. Coli (MDR) and Pseudomonas are recurrent pathogens. Blood cultures with no growth. Transthoracic Echocardiogram with stable valve pathologies. IR consulted and performed bone biopsy on 3/3. -Continue meropenem and Vancomycin -Follow-up biopsy results -Continued ID recommendations  Greater trochanter fracture (HCC) Right sided. Unknown mechanism injury. Incidentally identified on MRI pelvis. Non-displaced. Asymptomatic. Orthopedic surgery consulted and recommended WBAT.  Vaginal discharge Wet prep positive only for WBCs. GC/Chlamydia obtained and are negative; patient is low risk. Patient with prior hysterectomy.  Depression with anxiety -Continue home Abilify, Buspar, Cymbalta  Chronic diastolic CHF (congestive heart failure) (HCC) Euvolemic on admission. Lasix held on admission. Transthoracic Echocardiogram stable. -Continue home Lasix 40 mg daily  COPD (chronic obstructive pulmonary disease) (HCC) Stable. No exacerbation at this  time. -Continue albuterol PRN  CKD (chronic kidney disease), stage IV (HCC) Baseline creatinine is about 1.6-1.8. Stable.    DVT prophylaxis: Heparin subq Code Status:   Code Status: DNR Family Communication: None at bedside Disposition Plan: Discharge pending IR biopsy in addition to ID recommendations for antibiotics and workup   Consultants:  Infectious disease Orthopedic surgery  Procedures:  None  Antimicrobials: Vancomycin Meropenem    Subjective: Patient with worsened pelvic pain after biopsy. Otherwise no concerns.  Objective: BP 113/67 (BP Location: Right Arm)    Pulse 71    Temp 98.2 F (36.8 C) (Oral)    Resp 16    Ht '5\' 1"'$  (1.549 m)    Wt 57.8 kg    SpO2 100%    BMI 24.08 kg/m   Examination:  General exam: Appears calm and comfortable Respiratory system: Clear to auscultation. Respiratory effort normal. Cardiovascular system: S1 & S2 heard, RRR. Gastrointestinal system: Abdomen is nondistended, soft and with significant tenderness over pubis. No organomegaly or masses felt. Normal bowel sounds heard. Central nervous system: Alert and oriented. No focal neurological deficits. Musculoskeletal: No calf tenderness Skin: No cyanosis. No rashes Psychiatry: Judgement and insight appear normal. Mood & affect appropriate.    Data Reviewed: I have personally reviewed following labs and imaging studies  CBC Lab Results  Component Value Date   WBC 7.6 03/20/2021   RBC 3.40 (L) 03/20/2021   HGB 9.8 (L) 03/20/2021   HCT 31.4 (L) 03/20/2021   MCV 92.4 03/20/2021   MCH 28.8 03/20/2021   PLT 541 (H) 03/20/2021   MCHC 31.2 03/20/2021   RDW 14.2 03/20/2021   LYMPHSABS 1.4 03/17/2021   MONOABS 0.5 03/17/2021   EOSABS 0.1 03/17/2021   BASOSABS 0.0 35/00/9381     Last metabolic panel Lab Results  Component Value Date   NA 133 (L) 03/20/2021   K 3.5 03/20/2021   CL 101 03/20/2021  [facility], [place] [address], [place], [place] [address]      Radiology Studies: CT BIOPSY  Result Date: 04/11/2021 INDICATION: 67 year old female with 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 EXAM: CT BIOPSY COMPARISON:  03/16/2021 MEDICATIONS: None. ANESTHESIA/SEDATION: Fentanyl 100 mcg IV; Versed 2 mg IV Sedation time: 10 minutes; The patient was continuously monitored during the procedure by the interventional radiology nurse under my direct supervision. CONTRAST:  None. COMPLICATIONS: None immediate. PROCEDURE: Informed consent was obtained from the patient following an explanation of the procedure, risks, benefits and alternatives. A time out was performed prior to the initiation of the procedure. RADIATION DOSE REDUCTION: This exam was performed according to the departmental dose-optimization program which includes automated exposure control, adjustment of the mA and/or kV according to patient size and/or use of iterative reconstruction technique. The patient was positioned supine on the CT table and a limited CT was performed  for procedural planning to target the left pubic bone. The procedure was planned. The operative site was prepped and draped in the usual sterile fashion. Appropriate trajectory was confirmed with a 22 gauge spinal needle after the adjacent tissues were anesthetized with 1% Lidocaine with epinephrine. Under intermittent CT guidance, a 17 gauge coaxial needle was advanced into the peripheral aspect of the mass. Appropriate positioning was confirmed and a total of 2 samples were obtained with an 11 gauge core needle biopsy device. The co-axial needle was removed and hemostasis was achieved with manual compression. A limited postprocedural CT was negative for hemorrhage or additional complication. A dressing was placed. The patient tolerated the procedure well without immediate postprocedural complication. IMPRESSION: Technically successful CT guided core needle biopsy of left pubic bone. Ruthann Cancer, MD [facility] Radiology Electronically Signed   By: Ruthann Cancer M.D.   On: 2021/04/11 17:04   ECHOCARDIOGRAM COMPLETE  Result Date: 03/18/2021    ECHOCARDIOGRAM REPORT   Patient Name:   Shelia Chan Date of Exam: 03/18/2021 Medical Rec #:  [id number]      Height:       61.0 in Accession #:    [id number]     Weight:       127.4 lb Date of Birth:  1955-05-18       BSA:          1.559 m Patient Age:    62 years       BP:           103/71 mmHg Patient Gender: F              HR:           59 bpm. Exam Location:  Inpatient Procedure: 2D Echo, 3D Echo, Cardiac Doppler and Color Doppler Indications:    R01.1 Murmur. Osteomyelitis  History:        Patient has prior history of Echocardiogram examinations, most                 recent 06/21/2019. CHF, COPD, Signs/Symptoms:Murmur; Risk                 Factors:Current Smoker. Cancer.  Sonographer:    Roseanna Rainbow RDCS Referring Phys: [phone] Kinzlie Harney A Falecia Vannatter  Sonographer Comments: Suboptimal parasternal window. IMPRESSIONS  1. Left ventricular  ejection fraction, by estimation, is 65 to 70%. Left ventricular ejection fraction by 2D MOD biplane is 68.2 %. The left ventricle has normal function. The left ventricle has no regional wall motion abnormalities. There is mild left ventricular hypertrophy. Left ventricular  PROGRESS NOTE    Shelia Chan  GYI:[id number] DOB: 08-Nov-1955 DOA: 03/13/2021 PCP: [name], MD   Brief Narrative: 66 yo with hx urethral cancer s/p radical cystectomy, urethrectomy, and ileal conduit, COPD, HFpEF, depression/anxiety who presents with fevers and abdominal pain concerning for UTI.  Notably with history of pseudomonas and ESBL in the past. MRI this admission significant for possible osteomyelitis. Infectious disease consulted. Incidentally, patient found to have a likely non-displaced right greater trochanteric fracture with orthopedic surgery clearing for weight bearing as tolerated. Bone biopsy performed on 3/3.   Assessment and Plan: * Infection of pelvis ([place]) MRI concerning for osteomyelitis. Infectious disease consulted and have recommended IR consult for biopsy and cultures since patient has a history of urethral cancer. Patient also with recurrent positive urine culture, complicated by ileal conduit history. E. Coli (MDR) and Pseudomonas are recurrent pathogens. Blood cultures with no growth. Transthoracic Echocardiogram with stable valve pathologies. IR consulted and performed bone biopsy on 3/3. -Continue meropenem and Vancomycin -Follow-up biopsy results -Continued ID recommendations  Greater trochanter fracture (HCC) Right sided. Unknown mechanism injury. Incidentally identified on MRI pelvis. Non-displaced. Asymptomatic. Orthopedic surgery consulted and recommended WBAT.  Vaginal discharge Wet prep positive only for WBCs. GC/Chlamydia obtained and are negative; patient is low risk. Patient with prior hysterectomy.  Depression with anxiety -Continue home Abilify, Buspar, Cymbalta  Chronic diastolic CHF (congestive heart failure) (HCC) Euvolemic on admission. Lasix held on admission. Transthoracic Echocardiogram stable. -Continue home Lasix 40 mg daily  COPD (chronic obstructive pulmonary disease) (HCC) Stable. No exacerbation at this  time. -Continue albuterol PRN  CKD (chronic kidney disease), stage IV (HCC) Baseline creatinine is about 1.6-1.8. Stable.    DVT prophylaxis: Heparin subq Code Status:   Code Status: DNR Family Communication: None at bedside Disposition Plan: Discharge pending IR biopsy in addition to ID recommendations for antibiotics and workup   Consultants:  Infectious disease Orthopedic surgery  Procedures:  None  Antimicrobials: Vancomycin Meropenem    Subjective: Patient with worsened pelvic pain after biopsy. Otherwise no concerns.  Objective: BP 113/67 (BP Location: Right Arm)    Pulse 71    Temp 98.2 F (36.8 C) (Oral)    Resp 16    Ht '5\' 1"'$  (1.549 m)    Wt 57.8 kg    SpO2 100%    BMI 24.08 kg/m   Examination:  General exam: Appears calm and comfortable Respiratory system: Clear to auscultation. Respiratory effort normal. Cardiovascular system: S1 & S2 heard, RRR. Gastrointestinal system: Abdomen is nondistended, soft and with significant tenderness over pubis. No organomegaly or masses felt. Normal bowel sounds heard. Central nervous system: Alert and oriented. No focal neurological deficits. Musculoskeletal: No calf tenderness Skin: No cyanosis. No rashes Psychiatry: Judgement and insight appear normal. Mood & affect appropriate.    Data Reviewed: I have personally reviewed following labs and imaging studies  CBC Lab Results  Component Value Date   WBC 7.6 03/20/2021   RBC 3.40 (L) 03/20/2021   HGB 9.8 (L) 03/20/2021   HCT 31.4 (L) 03/20/2021   MCV 92.4 03/20/2021   MCH 28.8 03/20/2021   PLT 541 (H) 03/20/2021   MCHC 31.2 03/20/2021   RDW 14.2 03/20/2021   LYMPHSABS 1.4 03/17/2021   MONOABS 0.5 03/17/2021   EOSABS 0.1 03/17/2021   BASOSABS 0.0 35/00/9381     Last metabolic panel Lab Results  Component Value Date   NA 133 (L) 03/20/2021   K 3.5 03/20/2021   CL 101 03/20/2021  PROGRESS NOTE    Shelia Chan  GYI:[id number] DOB: 08-Nov-1955 DOA: 03/13/2021 PCP: [name], MD   Brief Narrative: 66 yo with hx urethral cancer s/p radical cystectomy, urethrectomy, and ileal conduit, COPD, HFpEF, depression/anxiety who presents with fevers and abdominal pain concerning for UTI.  Notably with history of pseudomonas and ESBL in the past. MRI this admission significant for possible osteomyelitis. Infectious disease consulted. Incidentally, patient found to have a likely non-displaced right greater trochanteric fracture with orthopedic surgery clearing for weight bearing as tolerated. Bone biopsy performed on 3/3.   Assessment and Plan: * Infection of pelvis ([place]) MRI concerning for osteomyelitis. Infectious disease consulted and have recommended IR consult for biopsy and cultures since patient has a history of urethral cancer. Patient also with recurrent positive urine culture, complicated by ileal conduit history. E. Coli (MDR) and Pseudomonas are recurrent pathogens. Blood cultures with no growth. Transthoracic Echocardiogram with stable valve pathologies. IR consulted and performed bone biopsy on 3/3. -Continue meropenem and Vancomycin -Follow-up biopsy results -Continued ID recommendations  Greater trochanter fracture (HCC) Right sided. Unknown mechanism injury. Incidentally identified on MRI pelvis. Non-displaced. Asymptomatic. Orthopedic surgery consulted and recommended WBAT.  Vaginal discharge Wet prep positive only for WBCs. GC/Chlamydia obtained and are negative; patient is low risk. Patient with prior hysterectomy.  Depression with anxiety -Continue home Abilify, Buspar, Cymbalta  Chronic diastolic CHF (congestive heart failure) (HCC) Euvolemic on admission. Lasix held on admission. Transthoracic Echocardiogram stable. -Continue home Lasix 40 mg daily  COPD (chronic obstructive pulmonary disease) (HCC) Stable. No exacerbation at this  time. -Continue albuterol PRN  CKD (chronic kidney disease), stage IV (HCC) Baseline creatinine is about 1.6-1.8. Stable.    DVT prophylaxis: Heparin subq Code Status:   Code Status: DNR Family Communication: None at bedside Disposition Plan: Discharge pending IR biopsy in addition to ID recommendations for antibiotics and workup   Consultants:  Infectious disease Orthopedic surgery  Procedures:  None  Antimicrobials: Vancomycin Meropenem    Subjective: Patient with worsened pelvic pain after biopsy. Otherwise no concerns.  Objective: BP 113/67 (BP Location: Right Arm)    Pulse 71    Temp 98.2 F (36.8 C) (Oral)    Resp 16    Ht '5\' 1"'$  (1.549 m)    Wt 57.8 kg    SpO2 100%    BMI 24.08 kg/m   Examination:  General exam: Appears calm and comfortable Respiratory system: Clear to auscultation. Respiratory effort normal. Cardiovascular system: S1 & S2 heard, RRR. Gastrointestinal system: Abdomen is nondistended, soft and with significant tenderness over pubis. No organomegaly or masses felt. Normal bowel sounds heard. Central nervous system: Alert and oriented. No focal neurological deficits. Musculoskeletal: No calf tenderness Skin: No cyanosis. No rashes Psychiatry: Judgement and insight appear normal. Mood & affect appropriate.    Data Reviewed: I have personally reviewed following labs and imaging studies  CBC Lab Results  Component Value Date   WBC 7.6 03/20/2021   RBC 3.40 (L) 03/20/2021   HGB 9.8 (L) 03/20/2021   HCT 31.4 (L) 03/20/2021   MCV 92.4 03/20/2021   MCH 28.8 03/20/2021   PLT 541 (H) 03/20/2021   MCHC 31.2 03/20/2021   RDW 14.2 03/20/2021   LYMPHSABS 1.4 03/17/2021   MONOABS 0.5 03/17/2021   EOSABS 0.1 03/17/2021   BASOSABS 0.0 35/00/9381     Last metabolic panel Lab Results  Component Value Date   NA 133 (L) 03/20/2021   K 3.5 03/20/2021   CL 101 03/20/2021  PROGRESS NOTE    Shelia Chan  GYI:[id number] DOB: 08-Nov-1955 DOA: 03/13/2021 PCP: [name], MD   Brief Narrative: 66 yo with hx urethral cancer s/p radical cystectomy, urethrectomy, and ileal conduit, COPD, HFpEF, depression/anxiety who presents with fevers and abdominal pain concerning for UTI.  Notably with history of pseudomonas and ESBL in the past. MRI this admission significant for possible osteomyelitis. Infectious disease consulted. Incidentally, patient found to have a likely non-displaced right greater trochanteric fracture with orthopedic surgery clearing for weight bearing as tolerated. Bone biopsy performed on 3/3.   Assessment and Plan: * Infection of pelvis ([place]) MRI concerning for osteomyelitis. Infectious disease consulted and have recommended IR consult for biopsy and cultures since patient has a history of urethral cancer. Patient also with recurrent positive urine culture, complicated by ileal conduit history. E. Coli (MDR) and Pseudomonas are recurrent pathogens. Blood cultures with no growth. Transthoracic Echocardiogram with stable valve pathologies. IR consulted and performed bone biopsy on 3/3. -Continue meropenem and Vancomycin -Follow-up biopsy results -Continued ID recommendations  Greater trochanter fracture (HCC) Right sided. Unknown mechanism injury. Incidentally identified on MRI pelvis. Non-displaced. Asymptomatic. Orthopedic surgery consulted and recommended WBAT.  Vaginal discharge Wet prep positive only for WBCs. GC/Chlamydia obtained and are negative; patient is low risk. Patient with prior hysterectomy.  Depression with anxiety -Continue home Abilify, Buspar, Cymbalta  Chronic diastolic CHF (congestive heart failure) (HCC) Euvolemic on admission. Lasix held on admission. Transthoracic Echocardiogram stable. -Continue home Lasix 40 mg daily  COPD (chronic obstructive pulmonary disease) (HCC) Stable. No exacerbation at this  time. -Continue albuterol PRN  CKD (chronic kidney disease), stage IV (HCC) Baseline creatinine is about 1.6-1.8. Stable.    DVT prophylaxis: Heparin subq Code Status:   Code Status: DNR Family Communication: None at bedside Disposition Plan: Discharge pending IR biopsy in addition to ID recommendations for antibiotics and workup   Consultants:  Infectious disease Orthopedic surgery  Procedures:  None  Antimicrobials: Vancomycin Meropenem    Subjective: Patient with worsened pelvic pain after biopsy. Otherwise no concerns.  Objective: BP 113/67 (BP Location: Right Arm)    Pulse 71    Temp 98.2 F (36.8 C) (Oral)    Resp 16    Ht '5\' 1"'$  (1.549 m)    Wt 57.8 kg    SpO2 100%    BMI 24.08 kg/m   Examination:  General exam: Appears calm and comfortable Respiratory system: Clear to auscultation. Respiratory effort normal. Cardiovascular system: S1 & S2 heard, RRR. Gastrointestinal system: Abdomen is nondistended, soft and with significant tenderness over pubis. No organomegaly or masses felt. Normal bowel sounds heard. Central nervous system: Alert and oriented. No focal neurological deficits. Musculoskeletal: No calf tenderness Skin: No cyanosis. No rashes Psychiatry: Judgement and insight appear normal. Mood & affect appropriate.    Data Reviewed: I have personally reviewed following labs and imaging studies  CBC Lab Results  Component Value Date   WBC 7.6 03/20/2021   RBC 3.40 (L) 03/20/2021   HGB 9.8 (L) 03/20/2021   HCT 31.4 (L) 03/20/2021   MCV 92.4 03/20/2021   MCH 28.8 03/20/2021   PLT 541 (H) 03/20/2021   MCHC 31.2 03/20/2021   RDW 14.2 03/20/2021   LYMPHSABS 1.4 03/17/2021   MONOABS 0.5 03/17/2021   EOSABS 0.1 03/17/2021   BASOSABS 0.0 35/00/9381     Last metabolic panel Lab Results  Component Value Date   NA 133 (L) 03/20/2021   K 3.5 03/20/2021   CL 101 03/20/2021  PROGRESS NOTE    Shelia Chan  GYI:[id number] DOB: 08-Nov-1955 DOA: 03/13/2021 PCP: [name], MD   Brief Narrative: 66 yo with hx urethral cancer s/p radical cystectomy, urethrectomy, and ileal conduit, COPD, HFpEF, depression/anxiety who presents with fevers and abdominal pain concerning for UTI.  Notably with history of pseudomonas and ESBL in the past. MRI this admission significant for possible osteomyelitis. Infectious disease consulted. Incidentally, patient found to have a likely non-displaced right greater trochanteric fracture with orthopedic surgery clearing for weight bearing as tolerated. Bone biopsy performed on 3/3.   Assessment and Plan: * Infection of pelvis ([place]) MRI concerning for osteomyelitis. Infectious disease consulted and have recommended IR consult for biopsy and cultures since patient has a history of urethral cancer. Patient also with recurrent positive urine culture, complicated by ileal conduit history. E. Coli (MDR) and Pseudomonas are recurrent pathogens. Blood cultures with no growth. Transthoracic Echocardiogram with stable valve pathologies. IR consulted and performed bone biopsy on 3/3. -Continue meropenem and Vancomycin -Follow-up biopsy results -Continued ID recommendations  Greater trochanter fracture (HCC) Right sided. Unknown mechanism injury. Incidentally identified on MRI pelvis. Non-displaced. Asymptomatic. Orthopedic surgery consulted and recommended WBAT.  Vaginal discharge Wet prep positive only for WBCs. GC/Chlamydia obtained and are negative; patient is low risk. Patient with prior hysterectomy.  Depression with anxiety -Continue home Abilify, Buspar, Cymbalta  Chronic diastolic CHF (congestive heart failure) (HCC) Euvolemic on admission. Lasix held on admission. Transthoracic Echocardiogram stable. -Continue home Lasix 40 mg daily  COPD (chronic obstructive pulmonary disease) (HCC) Stable. No exacerbation at this  time. -Continue albuterol PRN  CKD (chronic kidney disease), stage IV (HCC) Baseline creatinine is about 1.6-1.8. Stable.    DVT prophylaxis: Heparin subq Code Status:   Code Status: DNR Family Communication: None at bedside Disposition Plan: Discharge pending IR biopsy in addition to ID recommendations for antibiotics and workup   Consultants:  Infectious disease Orthopedic surgery  Procedures:  None  Antimicrobials: Vancomycin Meropenem    Subjective: Patient with worsened pelvic pain after biopsy. Otherwise no concerns.  Objective: BP 113/67 (BP Location: Right Arm)    Pulse 71    Temp 98.2 F (36.8 C) (Oral)    Resp 16    Ht '5\' 1"'$  (1.549 m)    Wt 57.8 kg    SpO2 100%    BMI 24.08 kg/m   Examination:  General exam: Appears calm and comfortable Respiratory system: Clear to auscultation. Respiratory effort normal. Cardiovascular system: S1 & S2 heard, RRR. Gastrointestinal system: Abdomen is nondistended, soft and with significant tenderness over pubis. No organomegaly or masses felt. Normal bowel sounds heard. Central nervous system: Alert and oriented. No focal neurological deficits. Musculoskeletal: No calf tenderness Skin: No cyanosis. No rashes Psychiatry: Judgement and insight appear normal. Mood & affect appropriate.    Data Reviewed: I have personally reviewed following labs and imaging studies  CBC Lab Results  Component Value Date   WBC 7.6 03/20/2021   RBC 3.40 (L) 03/20/2021   HGB 9.8 (L) 03/20/2021   HCT 31.4 (L) 03/20/2021   MCV 92.4 03/20/2021   MCH 28.8 03/20/2021   PLT 541 (H) 03/20/2021   MCHC 31.2 03/20/2021   RDW 14.2 03/20/2021   LYMPHSABS 1.4 03/17/2021   MONOABS 0.5 03/17/2021   EOSABS 0.1 03/17/2021   BASOSABS 0.0 35/00/9381     Last metabolic panel Lab Results  Component Value Date   NA 133 (L) 03/20/2021   K 3.5 03/20/2021   CL 101 03/20/2021  [facility], [place] [address], [place], [place] [address]      Radiology Studies: CT BIOPSY  Result Date: 04/11/2021 INDICATION: 67 year old female with 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 EXAM: CT BIOPSY COMPARISON:  03/16/2021 MEDICATIONS: None. ANESTHESIA/SEDATION: Fentanyl 100 mcg IV; Versed 2 mg IV Sedation time: 10 minutes; The patient was continuously monitored during the procedure by the interventional radiology nurse under my direct supervision. CONTRAST:  None. COMPLICATIONS: None immediate. PROCEDURE: Informed consent was obtained from the patient following an explanation of the procedure, risks, benefits and alternatives. A time out was performed prior to the initiation of the procedure. RADIATION DOSE REDUCTION: This exam was performed according to the departmental dose-optimization program which includes automated exposure control, adjustment of the mA and/or kV according to patient size and/or use of iterative reconstruction technique. The patient was positioned supine on the CT table and a limited CT was performed  for procedural planning to target the left pubic bone. The procedure was planned. The operative site was prepped and draped in the usual sterile fashion. Appropriate trajectory was confirmed with a 22 gauge spinal needle after the adjacent tissues were anesthetized with 1% Lidocaine with epinephrine. Under intermittent CT guidance, a 17 gauge coaxial needle was advanced into the peripheral aspect of the mass. Appropriate positioning was confirmed and a total of 2 samples were obtained with an 11 gauge core needle biopsy device. The co-axial needle was removed and hemostasis was achieved with manual compression. A limited postprocedural CT was negative for hemorrhage or additional complication. A dressing was placed. The patient tolerated the procedure well without immediate postprocedural complication. IMPRESSION: Technically successful CT guided core needle biopsy of left pubic bone. Ruthann Cancer, MD [facility] Radiology Electronically Signed   By: Ruthann Cancer M.D.   On: 2021/04/11 17:04   ECHOCARDIOGRAM COMPLETE  Result Date: 03/18/2021    ECHOCARDIOGRAM REPORT   Patient Name:   Shelia Chan Date of Exam: 03/18/2021 Medical Rec #:  [id number]      Height:       61.0 in Accession #:    [id number]     Weight:       127.4 lb Date of Birth:  1955-05-18       BSA:          1.559 m Patient Age:    62 years       BP:           103/71 mmHg Patient Gender: F              HR:           59 bpm. Exam Location:  Inpatient Procedure: 2D Echo, 3D Echo, Cardiac Doppler and Color Doppler Indications:    R01.1 Murmur. Osteomyelitis  History:        Patient has prior history of Echocardiogram examinations, most                 recent 06/21/2019. CHF, COPD, Signs/Symptoms:Murmur; Risk                 Factors:Current Smoker. Cancer.  Sonographer:    Roseanna Rainbow RDCS Referring Phys: [phone] Kinzlie Harney A Falecia Vannatter  Sonographer Comments: Suboptimal parasternal window. IMPRESSIONS  1. Left ventricular  ejection fraction, by estimation, is 65 to 70%. Left ventricular ejection fraction by 2D MOD biplane is 68.2 %. The left ventricle has normal function. The left ventricle has no regional wall motion abnormalities. There is mild left ventricular hypertrophy. 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 EXAM: CT BIOPSY COMPARISON:  03/16/2021 MEDICATIONS: None. ANESTHESIA/SEDATION: Fentanyl 100 mcg IV; Versed 2 mg IV Sedation time: 10 minutes; The patient was continuously monitored during the procedure by the interventional radiology nurse under my direct supervision. CONTRAST:  None. COMPLICATIONS: None immediate. PROCEDURE: Informed consent was obtained from the patient following an explanation of the procedure, risks, benefits and alternatives. A time out was performed prior to the initiation of the procedure. RADIATION DOSE REDUCTION: This exam was performed according to the departmental dose-optimization program which includes automated exposure control, adjustment of the mA and/or kV according to patient size and/or use of iterative reconstruction technique. The patient was positioned supine on the CT table and a limited CT was performed  for procedural planning to target the left pubic bone. The procedure was planned. The operative site was prepped and draped in the usual sterile fashion. Appropriate trajectory was confirmed with a 22 gauge spinal needle after the adjacent tissues were anesthetized with 1% Lidocaine with epinephrine. Under intermittent CT guidance, a 17 gauge coaxial needle was advanced into the peripheral aspect of the mass. Appropriate positioning was confirmed and a total of 2 samples were obtained with an 11 gauge core needle biopsy device. The co-axial needle was removed and hemostasis was achieved with manual compression. A limited postprocedural CT was negative for hemorrhage or additional complication. A dressing was placed. The patient tolerated the procedure well without immediate postprocedural complication. IMPRESSION: Technically successful CT guided core needle biopsy of left pubic bone. Ruthann Cancer, MD [facility] Radiology Electronically Signed   By: Ruthann Cancer M.D.   On: 2021/04/11 17:04   ECHOCARDIOGRAM COMPLETE  Result Date: 03/18/2021    ECHOCARDIOGRAM REPORT   Patient Name:   Shelia Chan Date of Exam: 03/18/2021 Medical Rec #:  [id number]      Height:       61.0 in Accession #:    [id number]     Weight:       127.4 lb Date of Birth:  1955-05-18       BSA:          1.559 m Patient Age:    62 years       BP:           103/71 mmHg Patient Gender: F              HR:           59 bpm. Exam Location:  Inpatient Procedure: 2D Echo, 3D Echo, Cardiac Doppler and Color Doppler Indications:    R01.1 Murmur. Osteomyelitis  History:        Patient has prior history of Echocardiogram examinations, most                 recent 06/21/2019. CHF, COPD, Signs/Symptoms:Murmur; Risk                 Factors:Current Smoker. Cancer.  Sonographer:    Roseanna Rainbow RDCS Referring Phys: [phone] Kinzlie Harney A Falecia Vannatter  Sonographer Comments: Suboptimal parasternal window. IMPRESSIONS  1. Left ventricular  ejection fraction, by estimation, is 65 to 70%. Left ventricular ejection fraction by 2D MOD biplane is 68.2 %. The left ventricle has normal function. The left ventricle has no regional wall motion abnormalities. There is mild left ventricular hypertrophy. Left ventricular

## 2021-03-21 LAB — VANCOMYCIN, TROUGH: Vancomycin Tr: 13 ug/mL — ABNORMAL LOW (ref 15–20)

## 2021-03-21 LAB — CREATININE, SERUM
Creatinine, Ser: 1.75 mg/dL — ABNORMAL HIGH (ref 0.44–1.00)
GFR, Estimated: 32 mL/min — ABNORMAL LOW (ref >60)

## 2021-03-21 NOTE — Progress Notes (Addendum)
Pharmacy Antibiotic Note ? ?Shelia Chan is a 66 y.o. female admitted on 03/13/2021 with UTI and imaging concerning for osteo. Pharmacy consulted for Meropenem and Vancomycin dosing.  ? ?Day #7 Vancomycin, #8 Meropenem ?- Afebrile ?- WBC improved 7.6 on 3/4 ?- Hx CKDIV, SCr trending up ? ?Plan: ?Continue Vancomycin 1g IV every 48h - check trough today at 16:00 before 4th dose to r/o accumulation with rising SCr ?Continue Meropenem 1g IV every 12 hours ?F/u ability to narrow antibiotics ?Monitor renal function and culture data  ? ?Height: '5\' 1"'$  (154.9 cm) ?Weight: 57.8 kg (127 lb 6.8 oz) ?IBW/kg (Calculated) : 47.8 ? ?Temp (24hrs), Avg:98.2 ?F (36.8 ?C), Min:98.1 ?F (36.7 ?C), Max:98.2 ?F (36.8 ?C) ? ?Recent Labs  ?Lab 03/15/21 ?0402 03/16/21 ?8315 03/17/21 ?0424 03/20/21 ?0406 03/21/21 ?0421  ?WBC 12.9* 11.1* 8.4 7.6  --   ?CREATININE 1.61* 1.94* 1.58* 1.67* 1.75*  ? ?  ?Estimated Creatinine Clearance: 25.9 mL/min (A) (by C-G formula based on SCr of 1.75 mg/dL (H)).   ? ?Allergies  ?Allergen Reactions  ? Metoprolol Shortness Of Breath  ? ? ?Antimicrobials this admission:  ?2/26 Zosyn x1 ?2/26 Meropenem >>  ?2/27 vancomycin >>  ?  ?Microbiology results:  ?2/25 BCx2: NGF ?2/26 UCx: > 100K E coli ( R to Amp, cefazolin, cefepime, CTX, cipro, Sulfa, Sens to gent, imipenem, nitrofurantoin, Zosyn,) 60 K PsA ( pansens)  ? ?Thank you for allowing pharmacy to be a part of this patient?s care. ? ?Peggyann Juba, PharmD, BCPS ?Pharmacy: [phone] ?03/21/2021 11:20 AM  ? ?Addendum: ?Vancomycin trough = 13 mcg/ml (drawn 30 minutes early) is just below therapeutic range and non-toxic.  Will continue same dose and follow renal function. ? ?Peggyann Juba, PharmD, BCPS ?03/21/2021 4:53 PM ? ? ?  ? ?

## 2021-03-21 NOTE — Progress Notes (Signed)
?   03/21/21 1358  ?Mobility  ?Activity Ambulated with assistance in hallway  ?Level of Assistance Modified independent, requires aide device or extra time  ?Assistive Device Other (Comment) ?(handheld for comfort)  ?Distance Ambulated (ft) 600 ft  ?Activity Response Tolerated well  ?$Mobility charge 1 Mobility  ? ?Pt agreeable to mobilize this afternoon. Ambulated about 66f in hall with handheld assist for comfort, tolerated well. Noted 3/10 pain in pelvis, otherwise no complaints Left pt in bed, call bell at side. RN/NT notified of session.  ?KRudell Cobb?Mobility Specialist ?Acute Rehab Services ?Office: [phone]? ? ?

## 2021-03-21 NOTE — Progress Notes (Signed)
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  Notably with history of pseudomonas and ESBL in the past. MRI this admission significant for possible osteomyelitis. Infectious disease consulted. Incidentally, patient found to have a likely non-displaced right greater trochanteric fracture with orthopedic surgery clearing for weight bearing as tolerated. Bone biopsy performed on 3/3.   Assessment and Plan: * Infection of pelvis ([place]) MRI concerning for osteomyelitis. Infectious disease consulted and have recommended IR consult for biopsy and cultures since patient has a history of urethral cancer. Patient also with recurrent positive urine culture, complicated by ileal conduit history. E. Coli ([facility]) and Pseudomonas are recurrent pathogens. Blood cultures with no growth. Transthoracic Echocardiogram with stable valve pathologies. IR consulted and performed bone biopsy on 3/3. -Continue meropenem and Vancomycin -Follow-up biopsy results -Continued ID recommendations  Greater trochanter fracture (HCC) Right sided. Unknown mechanism injury. Incidentally identified on MRI pelvis. Non-displaced. Asymptomatic. Orthopedic surgery consulted and recommended WBAT.  Vaginal discharge Wet prep positive only for WBCs. GC/Chlamydia obtained and are negative; patient is low risk. Patient with prior hysterectomy.  Depression with anxiety -Continue home Abilify, Buspar, Cymbalta  Chronic diastolic CHF (congestive heart failure) (HCC) Euvolemic on admission. Lasix held on admission. Transthoracic Echocardiogram stable. -Continue home Lasix 40 mg daily  COPD (chronic obstructive pulmonary disease) (HCC) Stable. No exacerbation at this  time. -Continue albuterol PRN  CKD (chronic kidney disease), stage IV (HCC) Baseline creatinine is about 1.6-1.8. Stable.    DVT prophylaxis: Heparin subq Code Status:   Code Status: DNR Family Communication: None at bedside Disposition Plan: Discharge pending IR biopsy in addition to ID recommendations for antibiotics and workup   Consultants:  Infectious disease Orthopedic surgery  Procedures:  None  Antimicrobials: Vancomycin Meropenem    Subjective: Some bad pelvic pain episode this morning but she is feeling better today. Overall feeling better.  Objective: BP 102/63 (BP Location: Right Arm)    Pulse 64    Temp 98.1 F (36.7 C) (Oral)    Resp 18    Ht '5\' 1"'$  (1.549 m)    Wt 57.8 kg    SpO2 100%    BMI 24.08 kg/m   Examination:  General exam: Appears calm and comfortable Respiratory system: Clear to auscultation. Respiratory effort normal. Cardiovascular system: S1 & S2 heard, RRR. Gastrointestinal system: Abdomen is soft. Tenderness over anterior pelvis. Normal bowel sounds heard. Central nervous system: Alert and oriented. No focal neurological deficits. Musculoskeletal: No edema. No calf tenderness Skin: No cyanosis. No rashes Psychiatry: Judgement and insight appear normal. Mood & affect appropriate.    Data Reviewed: I have personally reviewed following labs and imaging studies  CBC Lab Results  Component Value Date   WBC 7.6 03/20/2021   RBC 3.40 (L) 03/20/2021   HGB 9.8 (L) 03/20/2021   HCT 31.4 (L) 03/20/2021   MCV 92.4 03/20/2021   MCH 28.8 03/20/2021   PLT 541 (H) 03/20/2021   MCHC 31.2 03/20/2021   RDW 14.2 03/20/2021   LYMPHSABS 1.4 03/17/2021   MONOABS 0.5 03/17/2021   EOSABS 0.1 03/17/2021   BASOSABS 0.0 24/26/8341     Last metabolic panel Lab Results  Component Value Date   NA 133 (L) 03/20/2021   K 3.5 03/20/2021   CL 101 03/20/2021  CO2 22 03/20/2021   BUN 15 03/20/2021   CREATININE 1.75 (H) 03/21/2021   GLUCOSE 119 (H)  03/20/2021   GFRNONAA 32 (L) 03/21/2021   GFRAA 26 (L) 08/19/2019   CALCIUM 8.2 (L) 03/20/2021   PHOS 3.7 03/17/2021   PROT 6.1 (L) 03/17/2021   ALBUMIN 2.5 (L) 03/17/2021   BILITOT 0.2 (L) 03/17/2021   ALKPHOS 75 03/17/2021   AST 14 (L) 03/17/2021   ALT 11 03/17/2021   ANIONGAP 10 03/20/2021    GFR: Estimated Creatinine Clearance: 25.9 mL/min (A) (by C-G formula based on SCr of 1.75 mg/dL (H)).  Recent Results (from the past 240 hour(s))  Blood culture (routine x 2)     Status: None   Collection Time: 03/13/21 11:23 PM   Specimen: BLOOD  Result Value Ref Range Status   Specimen Description   Final    BLOOD LEFT ANTECUBITAL Performed at [facility] [address] [place], [place] [address]    Special Requests   Final    BOTTLES DRAWN AEROBIC ONLY Blood Culture adequate volume Performed at [facility] [address], [place], [place] [address]    Culture   Final    NO GROWTH 5 DAYS Performed at [facility], [place] [address] [place], [place] [address]    Report Status 03/19/2021 FINAL  Final  Blood culture (routine x 2)     Status: None   Collection Time: 03/13/21 11:46 PM   Specimen: BLOOD  Result Value Ref Range Status   Specimen Description   Final    BLOOD BLOOD RIGHT FOREARM Performed at [facility] [address], [place], [place] [address]    Special Requests   Final    BOTTLES DRAWN AEROBIC AND ANAEROBIC Blood Culture adequate volume Performed at [facility] [address], [place], [place] [address]    Culture   Final    NO GROWTH 5 DAYS Performed at [facility], [place] [address], [place], [place] [address]    Report Status 03/19/2021 FINAL  Final  Urine Culture     Status: Abnormal   Collection Time: 03/14/21  2:09 AM   Specimen: Urine, Catheterized  Result Value Ref Range Status   Specimen Description   Final    URINE, CATHETERIZED Performed at [facility] [address], [place], [place] [address]    Special Requests   Final    NONE Performed at [facility], [place] [address] [place], [place] [address]    Culture (A)  Final    >=100,000 COLONIES/mL ESCHERICHIA COLI 60,000 COLONIES/mL PSEUDOMONAS AERUGINOSA Confirmed Extended Spectrum Beta-Lactamase Producer (ESBL).  In bloodstream infections from ESBL organisms, carbapenems are preferred over piperacillin/tazobactam. They are shown to have a lower risk of mortality.    Report Status 03/16/2021 FINAL  Final   Organism ID, Bacteria ESCHERICHIA COLI (A)  Final   Organism ID, Bacteria PSEUDOMONAS AERUGINOSA (A)  Final      Susceptibility   Escherichia coli - MIC*    AMPICILLIN >=32 RESISTANT Resistant     CEFAZOLIN >=64 RESISTANT Resistant     CEFEPIME 16 RESISTANT Resistant     CEFTRIAXONE >=64 RESISTANT Resistant     CIPROFLOXACIN >=4 RESISTANT Resistant     GENTAMICIN <=1 SENSITIVE Sensitive     IMIPENEM <=0.25 SENSITIVE Sensitive     NITROFURANTOIN <=16 SENSITIVE Sensitive     TRIMETH/SULFA >=320 RESISTANT Resistant  AMPICILLIN/SULBACTAM 8 SENSITIVE Sensitive     PIP/TAZO <=4 SENSITIVE Sensitive     * >=100,000 COLONIES/mL ESCHERICHIA COLI   Pseudomonas aeruginosa - MIC*    CEFTAZIDIME 4 SENSITIVE Sensitive     CIPROFLOXACIN 0.5 SENSITIVE Sensitive     GENTAMICIN <=1 SENSITIVE Sensitive     IMIPENEM 2 SENSITIVE Sensitive     PIP/TAZO <=4 SENSITIVE Sensitive     CEFEPIME 2 SENSITIVE Sensitive     * 60,000 COLONIES/mL PSEUDOMONAS AERUGINOSA  Resp Panel by RT-PCR (Flu A&B, Covid) Nasopharyngeal Swab     Status: None   Collection Time: 03/14/21  2:09 AM   Specimen: Nasopharyngeal Swab; Nasopharyngeal(NP) swabs in vial transport medium  Result Value Ref Range Status   SARS Coronavirus 2 by RT PCR NEGATIVE NEGATIVE Final    Comment: (NOTE) SARS-CoV-2 target nucleic acids are NOT DETECTED.  The SARS-CoV-2 RNA is generally detectable in  upper respiratory specimens during the acute phase of infection. The lowest concentration of SARS-CoV-2 viral copies this assay can detect is 138 copies/mL. A negative result does not preclude SARS-Cov-2 infection and should not be used as the sole basis for treatment or other patient management decisions. A negative result may occur with  improper specimen collection/handling, submission of specimen other than nasopharyngeal swab, presence of viral mutation(s) within the areas targeted by this assay, and inadequate number of viral copies(<138 copies/mL). A negative result must be combined with clinical observations, patient history, and epidemiological information. The expected result is Negative.  Fact Sheet for Patients:  EntrepreneurPulse.com.au  Fact Sheet for Healthcare Providers:  IncredibleEmployment.be  This test is no t yet approved or cleared by the [place] FDA and  has been authorized for detection and/or diagnosis of SARS-CoV-2 by FDA under an Emergency Use Authorization (EUA). This EUA will remain  in effect (meaning this test can be used) for the duration of the COVID-19 declaration under Section 564(b)(1) of the Act, 21 U.S.C.section 360bbb-3(b)(1), unless the authorization is terminated  or revoked sooner.       Influenza A by PCR NEGATIVE NEGATIVE Final   Influenza B by PCR NEGATIVE NEGATIVE Final    Comment: (NOTE) The Xpert Xpress SARS-CoV-2/FLU/RSV plus assay is intended as an aid in the diagnosis of influenza from Nasopharyngeal swab specimens and should not be used as a sole basis for treatment. Nasal washings and aspirates are unacceptable for Xpert Xpress SARS-CoV-2/FLU/RSV testing.  Fact Sheet for Patients: EntrepreneurPulse.com.au  Fact Sheet for Healthcare Providers: IncredibleEmployment.be  This test is not yet approved or cleared by the [place] FDA and has been  authorized for detection and/or diagnosis of SARS-CoV-2 by FDA under an Emergency Use Authorization (EUA). This EUA will remain in effect (meaning this test can be used) for the duration of the COVID-19 declaration under Section 564(b)(1) of the Act, 21 U.S.C. section 360bbb-3(b)(1), unless the authorization is terminated or revoked.  Performed at [facility], [place] [address], [place],  [address]   Wet prep, genital     Status: Abnormal   Collection Time: 03/15/21  1:00 PM   Specimen: Vaginal  Result Value Ref Range Status   Yeast Wet Prep HPF POC NONE SEEN NONE SEEN Final   Trich, Wet Prep NONE SEEN NONE SEEN Final   Clue Cells Wet Prep HPF POC NONE SEEN NONE SEEN Final   WBC, Wet Prep HPF POC >=10 (A) <10 Final   Sperm NONE SEEN  Final    Comment: Performed at [facility], [place] [address], [place], [place] [address]      Radiology Studies: CT BIOPSY  Result Date: 2021-04-05 INDICATION: 66 year old female with 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 EXAM: CT BIOPSY COMPARISON:  03/16/2021 MEDICATIONS: None. ANESTHESIA/SEDATION: Fentanyl 100 mcg IV; Versed 2 mg IV Sedation time: 10 minutes; The patient was continuously monitored during the procedure by the interventional radiology nurse under my direct supervision. CONTRAST:  None. COMPLICATIONS: None immediate. PROCEDURE: Informed consent was obtained from the patient following an explanation of the procedure, risks, benefits and alternatives. A time out was performed prior to the initiation of the procedure. RADIATION DOSE REDUCTION: This exam was performed according to the departmental dose-optimization program which includes automated exposure control, adjustment of the mA and/or kV according to patient size and/or use of iterative reconstruction technique. The patient was positioned supine on the CT table and a limited CT was performed for procedural planning to target the left pubic  bone. The procedure was planned. The operative site was prepped and draped in the usual sterile fashion. Appropriate trajectory was confirmed with a 22 gauge spinal needle after the adjacent tissues were anesthetized with 1% Lidocaine with epinephrine. Under intermittent CT guidance, a 17 gauge coaxial needle was advanced into the peripheral aspect of the mass. Appropriate positioning was confirmed and a total of 2 samples were obtained with an 11 gauge core needle biopsy device. The co-axial needle was removed and hemostasis was achieved with manual compression. A limited postprocedural CT was negative for hemorrhage or additional complication. A dressing was placed. The patient tolerated the procedure well without immediate postprocedural complication. IMPRESSION: Technically successful CT guided core needle biopsy of left pubic bone. Ruthann Cancer, MD [facility] Radiology Electronically Signed   By: Ruthann Cancer M.D.   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 EXAM: FLUOROSCOPIC GUIDED bilateral NEPHROURETERAL CATHETER EXCHANGE COMPARISON:  02/09/2021 CONTRAST:  Fifteen mL Omnipaque administered into the collecting system FLUOROSCOPY TIME:  30 seconds, 6 mGy COMPLICATIONS: None immediate. TECHNIQUE: Informed written consent was obtained from the patient after a discussion of the risks, benefits and alternatives to treatment. Questions regarding the procedure were encouraged and answered. A timeout was performed prior to the initiation of the procedure. External portion of the existing retrograde nephroureteral cathetesr as well as the surrounding skin was draped in the usual sterile fashion. A sterile drape was applied covering the operative field. Maximum barrier sterile  technique with sterile gowns and gloves were used for the procedure. A timeout was performed prior to the initiation of the procedure. A pre procedural spot fluoroscopic image was obtained after contrast was injected via the existing right retrograde nephroureteral catheter demonstrating appropriate positioning within the renal pelvis. The existing catheter was cut and cannulated with an Amplatz wire which was coiled within the renal pelvis. Under intermittent fluoroscopic guidance, the existing nephrostomy catheter was exchanged for a new 10 French, 45 cm all-purpose drainage catheter. Contrast injection confirmed appropriate positioning within the renal pelvis and a post exchange fluoroscopic image was obtained. Identical steps were performed for the existing left retrograde nephroureteral catheter. The catheters were locked and the patient's urostomy was reapplied. The patient tolerated the procedure well without immediate postprocedural complication. FINDINGS: The existing nephroureteral catheters are appropriately positioned and functioning. After successful fluoroscopic guided exchange, the new nephrostomy catheters are coiled and locked  within the respective renal pelves. IMPRESSION: Successful fluoroscopic guided exchange of bilateral 10 French, 45 cm retrograde nephroureteral catheters. Ruthann Cancer, MD [facility] Radiology Electronically Signed   By: Ruthann Cancer M.D.   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 EXAM: FLUOROSCOPIC GUIDED bilateral NEPHROURETERAL CATHETER EXCHANGE COMPARISON:  02/09/2021 CONTRAST:  Fifteen mL Omnipaque administered into the collecting system FLUOROSCOPY TIME:  30 seconds, 6 mGy COMPLICATIONS: None immediate. TECHNIQUE: Informed written  consent was obtained from the patient after a discussion of the risks, benefits and alternatives to treatment. Questions regarding the procedure were encouraged and answered. A timeout was performed prior to the initiation of the procedure. External portion of the existing retrograde nephroureteral cathetesr as well as the surrounding skin was draped in the usual sterile fashion. A sterile drape was applied covering the operative field. Maximum barrier sterile technique with sterile gowns and gloves were used for the procedure. A timeout was performed prior to the initiation of the procedure. A pre procedural spot fluoroscopic image was obtained after contrast was injected via the existing right retrograde nephroureteral catheter demonstrating appropriate positioning within the renal pelvis. The existing catheter was cut and cannulated with an Amplatz wire which was coiled within the renal pelvis. Under intermittent fluoroscopic guidance, the existing nephrostomy catheter was exchanged for a new 10 French, 45 cm all-purpose drainage catheter. Contrast injection confirmed appropriate positioning within the renal pelvis and a post exchange fluoroscopic image was obtained. Identical steps were performed for the existing left retrograde nephroureteral catheter. The catheters were locked and the patient's urostomy was reapplied. The patient tolerated the procedure well without immediate postprocedural complication. FINDINGS: The existing nephroureteral catheters are appropriately positioned and functioning. After successful fluoroscopic guided exchange, the new nephrostomy catheters are coiled and locked within the respective renal pelves. IMPRESSION: Successful fluoroscopic guided exchange of bilateral 10 French, 45 cm retrograde nephroureteral catheters. Ruthann Cancer, MD [facility] Radiology Electronically Signed   By: Ruthann Cancer M.D.   On: 03/19/2021 17:09       LOS: 7 days    Cordelia Poche, MD [facility] 03/21/2021, 12:15 PM   If 7PM-7AM, please contact night-coverage [email or web address]

## 2021-03-22 ENCOUNTER — Other Ambulatory Visit (HOSPITAL_COMMUNITY): Payer: Medicare Other

## 2021-03-22 DIAGNOSIS — M60859 Other myositis, unspecified thigh: Secondary | ICD-10-CM

## 2021-03-22 NOTE — Progress Notes (Signed)
PROGRESS NOTE    Shelia Chan  ZOX:[id number] DOB: 1955/12/05 DOA: 03/13/2021 PCP: [name], MD   Brief Narrative: 66 yo with hx urethral cancer s/p radical cystectomy, urethrectomy, and ileal conduit, COPD, HFpEF, depression/anxiety who presents with fevers and abdominal pain concerning for UTI.  Notably with history of pseudomonas and ESBL in the past. MRI this admission significant for possible osteomyelitis. Infectious disease consulted. Incidentally, patient found to have a likely non-displaced right greater trochanteric fracture with orthopedic surgery clearing for weight bearing as tolerated. Bone biopsy performed on 3/3.   Assessment and Plan: * Infection of pelvis ([place]) MRI concerning for osteomyelitis. Infectious disease consulted and have recommended IR consult for biopsy and cultures since patient has a history of urethral cancer. Patient also with recurrent positive urine culture, complicated by ileal conduit history. E. Coli (MDR) and Pseudomonas are recurrent pathogens. Blood cultures with no growth. Transthoracic Echocardiogram with stable valve pathologies. IR consulted and performed bone biopsy on 3/3. -Continue meropenem and Vancomycin -Follow-up biopsy results -Continued ID recommendations  Greater trochanter fracture (HCC) Right sided. Unknown mechanism injury. Incidentally identified on MRI pelvis. Non-displaced. Asymptomatic. Orthopedic surgery consulted and recommended WBAT.  Vaginal discharge Wet prep positive only for WBCs. GC/Chlamydia obtained and are negative; patient is low risk. Patient with prior hysterectomy. Discussed with on-call gynecology, Dr. Ilda Basset, who recommended no further follow-up at this time.  Depression with anxiety -Continue home Abilify, Buspar, Cymbalta  Chronic diastolic CHF (congestive heart failure) (HCC) Euvolemic on admission. Lasix held on admission. Transthoracic Echocardiogram stable. -Continue home Lasix 40 mg  daily  COPD (chronic obstructive pulmonary disease) (HCC) Stable. No exacerbation at this time. -Continue albuterol PRN  CKD (chronic kidney disease), stage IV (HCC) Baseline creatinine is about 1.6-1.8. Stable.    DVT prophylaxis: Heparin subq Code Status:   Code Status: DNR Family Communication: None at bedside Disposition Plan: Discharge pending biopsy results in addition to ID recommendations for antibiotics and workup   Consultants:  Infectious disease Orthopedic surgery  Procedures:  None  Antimicrobials: Vancomycin Meropenem    Subjective: Pelvic pain is improving. Continues to have vaginal discharge. No other concerns.  Objective: BP 118/80 (BP Location: Right Arm)    Pulse 79    Temp 98 F (36.7 C)    Resp 16    Ht '5\' 1"'$  (1.549 m)    Wt 57.8 kg    SpO2 98%    BMI 24.08 kg/m   Examination:  General exam: Appears calm and comfortable Respiratory system: Clear to auscultation. Respiratory effort normal. Cardiovascular system: S1 & S2 heard, RRR. Gastrointestinal system: Abdomen is nondistended, soft and non-tender; deferred palpation of pelvis. Normal bowel sounds heard. Central nervous system: Alert and oriented. No focal neurological deficits. Musculoskeletal: No edema. No calf tenderness Skin: No cyanosis. No rashes    Data Reviewed: I have personally reviewed following labs and imaging studies  CBC Lab Results  Component Value Date   WBC 7.6 03/20/2021   RBC 3.40 (L) 03/20/2021   HGB 9.8 (L) 03/20/2021   HCT 31.4 (L) 03/20/2021   MCV 92.4 03/20/2021   MCH 28.8 03/20/2021   PLT 541 (H) 03/20/2021   MCHC 31.2 03/20/2021   RDW 14.2 03/20/2021   LYMPHSABS 1.4 03/17/2021   MONOABS 0.5 03/17/2021   EOSABS 0.1 03/17/2021   BASOSABS 0.0 81/19/1478     Last metabolic panel Lab Results  Component Value Date   NA 133 (L) 03/20/2021   K 3.5 03/20/2021   CL  101 03/20/2021   CO2 22 03/20/2021   BUN 15 03/20/2021   CREATININE 1.75 (H) 03/21/2021    GLUCOSE 119 (H) 03/20/2021   GFRNONAA 32 (L) 03/21/2021   GFRAA 26 (L) 08/19/2019   CALCIUM 8.2 (L) 03/20/2021   PHOS 3.7 03/17/2021   PROT 6.1 (L) 03/17/2021   ALBUMIN 2.5 (L) 03/17/2021   BILITOT 0.2 (L) 03/17/2021   ALKPHOS 75 03/17/2021   AST 14 (L) 03/17/2021   ALT 11 03/17/2021   ANIONGAP 10 03/20/2021    GFR: Estimated Creatinine Clearance: 25.9 mL/min (A) (by C-G formula based on SCr of 1.75 mg/dL (H)).  Recent Results (from the past 240 hour(s))  Blood culture (routine x 2)     Status: None   Collection Time: 03/13/21 11:23 PM   Specimen: BLOOD  Result Value Ref Range Status   Specimen Description   Final    BLOOD LEFT ANTECUBITAL Performed at [facility] [address] [place], [place] [address]    Special Requests   Final    BOTTLES DRAWN AEROBIC ONLY Blood Culture adequate volume Performed at [facility] [address] [place], [place] [address]    Culture   Final    NO GROWTH 5 DAYS Performed at [facility], [place] [address] [place], [place] [address]    Report Status 03/19/2021 FINAL  Final  Blood culture (routine x 2)     Status: None   Collection Time: 03/13/21 11:46 PM   Specimen: BLOOD  Result Value Ref Range Status   Specimen Description   Final    BLOOD BLOOD RIGHT FOREARM Performed at [facility] [address] [place], [place] [address]    Special Requests   Final    BOTTLES DRAWN AEROBIC AND ANAEROBIC Blood Culture adequate volume Performed at [facility] [address] [place], [place] [address]    Culture   Final    NO GROWTH 5 DAYS Performed at [facility], [place] [address] [place], [place] [address]    Report Status 03/19/2021 FINAL  Final  Urine Culture     Status: Abnormal   Collection Time: 03/14/21  2:09 AM   Specimen: Urine, Catheterized  Result Value Ref Range Status   Specimen Description   Final    URINE,  CATHETERIZED Performed at [facility] [address] [place], [place] [address]    Special Requests   Final    NONE Performed at [facility], [place] [address], [place], [place] [address]    Culture (A)  Final    >=100,000 COLONIES/mL ESCHERICHIA COLI 60,000 COLONIES/mL PSEUDOMONAS AERUGINOSA Confirmed Extended Spectrum Beta-Lactamase Producer (ESBL).  In bloodstream infections from ESBL organisms, carbapenems are preferred over piperacillin/tazobactam. They are shown to have a lower risk of mortality.    Report Status 03/16/2021 FINAL  Final   Organism ID, Bacteria ESCHERICHIA COLI (A)  Final   Organism ID, Bacteria PSEUDOMONAS AERUGINOSA (A)  Final      Susceptibility   Escherichia coli - MIC*    AMPICILLIN >=32 RESISTANT Resistant     CEFAZOLIN >=64 RESISTANT Resistant     CEFEPIME 16 RESISTANT Resistant     CEFTRIAXONE >=64 RESISTANT Resistant     CIPROFLOXACIN >=4 RESISTANT Resistant     GENTAMICIN <=1 SENSITIVE Sensitive     IMIPENEM <=0.25 SENSITIVE Sensitive     NITROFURANTOIN <=16 SENSITIVE Sensitive     TRIMETH/SULFA >=320 RESISTANT Resistant  AMPICILLIN/SULBACTAM 8 SENSITIVE Sensitive     PIP/TAZO <=4 SENSITIVE Sensitive     * >=100,000 COLONIES/mL ESCHERICHIA COLI   Pseudomonas aeruginosa - MIC*    CEFTAZIDIME 4 SENSITIVE Sensitive     CIPROFLOXACIN 0.5 SENSITIVE Sensitive     GENTAMICIN <=1 SENSITIVE Sensitive     IMIPENEM 2 SENSITIVE Sensitive     PIP/TAZO <=4 SENSITIVE Sensitive     CEFEPIME 2 SENSITIVE Sensitive     * 60,000 COLONIES/mL PSEUDOMONAS AERUGINOSA  Resp Panel by RT-PCR (Flu A&B, Covid) Nasopharyngeal Swab     Status: None   Collection Time: 03/14/21  2:09 AM   Specimen: Nasopharyngeal Swab; Nasopharyngeal(NP) swabs in vial transport medium  Result Value Ref Range Status   SARS Coronavirus 2 by RT PCR NEGATIVE NEGATIVE Final    Comment: (NOTE) SARS-CoV-2 target nucleic acids are NOT DETECTED.  The  SARS-CoV-2 RNA is generally detectable in upper respiratory specimens during the acute phase of infection. The lowest concentration of SARS-CoV-2 viral copies this assay can detect is 138 copies/mL. A negative result does not preclude SARS-Cov-2 infection and should not be used as the sole basis for treatment or other patient management decisions. A negative result may occur with  improper specimen collection/handling, submission of specimen other than nasopharyngeal swab, presence of viral mutation(s) within the areas targeted by this assay, and inadequate number of viral copies(<138 copies/mL). A negative result must be combined with clinical observations, patient history, and epidemiological information. The expected result is Negative.  Fact Sheet for Patients:  EntrepreneurPulse.com.au  Fact Sheet for Healthcare Providers:  IncredibleEmployment.be  This test is no t yet approved or cleared by the [place] FDA and  has been authorized for detection and/or diagnosis of SARS-CoV-2 by FDA under an Emergency Use Authorization (EUA). This EUA will remain  in effect (meaning this test can be used) for the duration of the COVID-19 declaration under Section 564(b)(1) of the Act, 21 U.S.C.section 360bbb-3(b)(1), unless the authorization is terminated  or revoked sooner.       Influenza A by PCR NEGATIVE NEGATIVE Final   Influenza B by PCR NEGATIVE NEGATIVE Final    Comment: (NOTE) The Xpert Xpress SARS-CoV-2/FLU/RSV plus assay is intended as an aid in the diagnosis of influenza from Nasopharyngeal swab specimens and should not be used as a sole basis for treatment. Nasal washings and aspirates are unacceptable for Xpert Xpress SARS-CoV-2/FLU/RSV testing.  Fact Sheet for Patients: EntrepreneurPulse.com.au  Fact Sheet for Healthcare Providers: IncredibleEmployment.be  This test is not yet approved or  cleared by the [place] FDA and has been authorized for detection and/or diagnosis of SARS-CoV-2 by FDA under an Emergency Use Authorization (EUA). This EUA will remain in effect (meaning this test can be used) for the duration of the COVID-19 declaration under Section 564(b)(1) of the Act, 21 U.S.C. section 360bbb-3(b)(1), unless the authorization is terminated or revoked.  Performed at [facility], [place] [address] [place], [place] [address]   Wet prep, genital     Status: Abnormal   Collection Time: 03/15/21  1:00 PM   Specimen: Vaginal  Result Value Ref Range Status   Yeast Wet Prep HPF POC NONE SEEN NONE SEEN Final   Trich, Wet Prep NONE SEEN NONE SEEN Final   Clue Cells Wet Prep HPF POC NONE SEEN NONE SEEN Final   WBC, Wet Prep HPF POC >=10 (A) <10 Final   Sperm NONE SEEN  Final    Comment: Performed at [facility], [place] [address], [place], [place] [address]      Radiology Studies: No results found.    LOS: 8 days    Cordelia Poche, MD [facility] 03/22/2021, 1:34 PM   If 7PM-7AM, please contact night-coverage [email or web address]

## 2021-03-22 NOTE — Progress Notes (Signed)
?   ? ? ? ? ?[facility] for Infectious Disease ? ?Date of Admission:  03/13/2021   Total days of inpatient antibiotics 5 ? ?Principal Problem: ?  Infection of pelvis ([place]) ?Active Problems: ?  CKD (chronic kidney disease), stage IV ([place]) ?  COPD (chronic obstructive pulmonary disease) ([place]) ?  Chronic diastolic CHF (congestive heart failure) ([place]) ?  Depression with anxiety ?  Vaginal discharge ?  Greater trochanter fracture ([place]) ?     ?    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 She reports she has pubic pain for the past three weeks.  ?-Imaging done due to complaint of pelvic pain.  Findings as below. ?-MR pelvis  w and w/o showed right greater trochanteric fracture, severe bone marrow edema  in b/l pubic bone concerning for osteomyelitis, concern for myositis throughout adductor musclelature b/l. ?-GC negative-vaginal swab on 03/15/21, HIV and RPR NR ?-Urine Cx: Ecoli ESBL, pseudomonas aeruginosa ?-Imaging is certainly concerning for infection but given her history of of urethral cancer(treated with chemotherapy and radiation per pt) malignancy is in the differential. As such recommended biopsy.  ? -IR consulted now SP Bx of pubic bone on 3/3 ?Recommendations:  ?-.Follow  bacterial/fungal/afb Cx(sent from [facility] to [facility] today. So clock starts today for Cx) and cytology from Bx ?-Continue meropenem and vancomycin to cover osteomyelitis.  ? ?Microbiology:   ?Antibiotics: ?Meropenem 2/25-p ?Pip-tazo 2/25 ?Vancomycin 2/27 ?Cultures: ?Blood ?2/25 NG ?Urine ?2/25 PsA and  Ecoli ? ? ?SUBJECTIVE: ?Resting in bed. She reports pubic bone pain has slightly   improved. ?Interval SP Bx on 3/3 ? ?Review of Systems: ?Review of Systems  ?All other systems reviewed and are negative. ? ? ?Scheduled Meds: ? ARIPiprazole  30 mg Oral Daily  ? aspirin EC  81 mg Oral Daily  ? atorvastatin  10 mg Oral QHS  ? busPIRone  7.5 mg Oral BID  ? DULoxetine  30 mg Oral QHS  ? furosemide  40 mg Oral Daily  ? heparin  5,000 Units Subcutaneous Q8H  ? pantoprazole  40 mg Oral Daily  ? polyethylene glycol  17 g Oral BID  ? ?Continuous Infusions: ? meropenem (MERREM) IV 1 g (03/22/21 2002)  ? ?PRN Meds:.acetaminophen **OR** acetaminophen, albuterol, HYDROcodone-acetaminophen, HYDROmorphone (DILAUDID) injection, lidocaine (PF), lip balm, methocarbamol, ondansetron **OR** ondansetron (ZOFRAN) IV ?Allergies  ?Allergen Reactions  ? Metoprolol Shortness Of Breath  ? ? ?OBJECTIVE: ?Vitals:  ? 03/21/21 2131 03/22/21 0526 03/22/21 1251 03/22/21 2001  ?BP: 123/65 117/69 118/80 106/71  ?Pulse: 60 63 79 71  ?Resp: '16 16 16 18  '$ ?Temp: 98.3 ?F (36.8 ?C) 98.6 ?F (37 ?C) 98 ?F (36.7 ?C) 99 ?F (37.2 ?C)  ?TempSrc: Oral Oral  Oral  ?SpO2: 96% 98% 98% 99%  ?Weight:      ?Height:      ? ?Body mass index is 24.08 kg/m?. ? ?Physical Exam ?Constitutional:   ?   Appearance: Normal appearance.  ?HENT:  ?   Head: Normocephalic and atraumatic.  ?   Right Ear: Tympanic membrane normal.  ?   Left Ear: Tympanic membrane normal.  ?   Nose: Nose normal.  ?   Mouth/Throat:  ?   Mouth: Mucous  membranes are moist.  ?Eyes:  ?   Extraocular Movements: Extraocular movements intact.  ?   Conjunctiva/sclera: Conjunctivae normal.  ?   Pupils: Pupils are equal, round, and reactive to light.  ?Cardiovascular:  ?   Rate and Rhythm: Normal rate and regular rhythm.  ?   Heart sounds: No murmur heard. ?  No friction rub. No gallop.  ?Pulmonary:  ?   Effort: Pulmonary effort is normal.  ?   Breath sounds: Normal breath sounds.  ?Abdominal:  ?   General: Abdomen is flat.  ?   Palpations: Abdomen is soft.  ?Genitourinary: ?   Comments: RLQ  urostomy ?Musculoskeletal:     ?   General: Normal range of motion.  ?Skin: ?   General: Skin is warm and dry.  ?Neurological:  ?   General: No focal deficit present.  ?   Mental Status: She is alert and oriented to person, place, and time.  ?Psychiatric:     ?   Mood and Affect: Mood normal.  ? ? ? ? ?Lab Results ?Lab Results  ?Component Value Date  ? WBC 7.6 03/20/2021  ? HGB 9.8 (L) 03/20/2021  ? HCT 31.4 (L) 03/20/2021  ? MCV 92.4 03/20/2021  ? PLT 541 (H) 03/20/2021  ?  ?Lab Results  ?Component Value Date  ? CREATININE 1.75 (H) 03/21/2021  ? BUN 15 03/20/2021  ? NA 133 (L) 03/20/2021  ? K 3.5 03/20/2021  ? CL 101 03/20/2021  ? CO2 22 03/20/2021  ?  ?Lab Results  ?Component Value Date  ? ALT 11 03/17/2021  ? AST 14 (L) 03/17/2021  ? ALKPHOS 75 03/17/2021  ? BILITOT 0.2 (L) 03/17/2021  ?  ? ? ? ? ?Laurice Record, MD ?[facility] ?[facility] ?03/22/2021, 9:20 PM  ?

## 2021-03-22 NOTE — Care Management Important Message (Signed)
Important Message ? ?Patient Details IM Letter given to the Patient. ?Name: Shelia Chan ?MRN: [id number] ?Date of Birth: 1955-03-14 ? ? ?Medicare Important Message Given:  Yes ? ? ? ? ?Kerin Salen ?03/22/2021, 3:08 PM ?

## 2021-03-22 NOTE — Progress Notes (Incomplete)
PHARMACY CONSULT NOTE FOR:  OUTPATIENT  PARENTERAL ANTIBIOTIC THERAPY (OPAT)  Indication:  Regimen:  End date:   IV antibiotic discharge orders are pended. To discharging provider:  please sign these orders via discharge navigator,  Select New Orders & click on the button choice - Manage This Unsigned Work.     Thank you for allowing pharmacy to be a part of this patient's care.  Lawson Radar 03/22/2021, 1:18 PM

## 2021-03-23 ENCOUNTER — Other Ambulatory Visit (HOSPITAL_COMMUNITY): Payer: Medicare Other

## 2021-03-23 DIAGNOSIS — D75839 Thrombocytosis, unspecified: Secondary | ICD-10-CM | POA: Diagnosis present

## 2021-03-23 DIAGNOSIS — E1169 Type 2 diabetes mellitus with other specified complication: Secondary | ICD-10-CM

## 2021-03-23 LAB — BASIC METABOLIC PANEL
Anion gap: 10 (ref 5–15)
BUN: 24 mg/dL — ABNORMAL HIGH (ref 8–23)
CO2: 27 mmol/L (ref 22–32)
Calcium: 8.9 mg/dL (ref 8.9–10.3)
Chloride: 96 mmol/L — ABNORMAL LOW (ref 98–111)
Creatinine, Ser: 1.76 mg/dL — ABNORMAL HIGH (ref 0.44–1.00)
GFR, Estimated: 31 mL/min — ABNORMAL LOW (ref >60)
Glucose, Bld: 96 mg/dL (ref 70–99)
Potassium: 3.5 mmol/L (ref 3.5–5.1)
Sodium: 133 mmol/L — ABNORMAL LOW (ref 135–145)

## 2021-03-23 LAB — CBC
HCT: 33.8 % — ABNORMAL LOW (ref 36.0–46.0)
Hemoglobin: 11.2 g/dL — ABNORMAL LOW (ref 12.0–15.0)
MCH: 29.1 pg (ref 26.0–34.0)
MCHC: 33.1 g/dL (ref 30.0–36.0)
MCV: 87.8 fL (ref 80.0–100.0)
Platelets: 676 10*3/uL — ABNORMAL HIGH (ref 150–400)
RBC: 3.85 MIL/uL — ABNORMAL LOW (ref 3.87–5.11)
RDW: 13.8 % (ref 11.5–15.5)
WBC: 6.5 10*3/uL (ref 4.0–10.5)
nRBC: 0 % (ref 0.0–0.2)

## 2021-03-23 LAB — SURGICAL PATHOLOGY

## 2021-03-23 NOTE — Progress Notes (Signed)
PROGRESS NOTE    Shelia Chan  UTM:[id number] DOB: 02/20/1955 DOA: 03/13/2021 PCP: [name], MD   Brief Narrative: 66 yo with hx urethral cancer s/p radical cystectomy, urethrectomy, and ileal conduit, COPD, HFpEF, depression/anxiety who presents with fevers and abdominal pain concerning for UTI.  Notably with history of pseudomonas and ESBL in the past. MRI this admission significant for possible osteomyelitis. Infectious disease consulted. Incidentally, patient found to have a likely non-displaced right greater trochanteric fracture with orthopedic surgery clearing for weight bearing as tolerated. Bone biopsy performed on 3/3. Cultures pending.   Assessment and Plan: * Infection of pelvis ([place]) MRI concerning for osteomyelitis. Infectious disease consulted and have recommended IR consult for biopsy and cultures since patient has a history of urethral cancer. Patient also with recurrent positive urine culture, complicated by ileal conduit history. E. Coli (MDR) and Pseudomonas are recurrent pathogens. Blood cultures with no growth. Transthoracic Echocardiogram with stable valve pathologies. IR consulted and performed bone biopsy on 3/3. -Continue meropenem and Vancomycin -Follow-up biopsy/culture results -Continued ID recommendations  Thrombocytosis Likely reactive. Platelets are rising slightly.  Greater trochanter fracture (HCC) Right sided. Unknown mechanism injury. Incidentally identified on MRI pelvis. Non-displaced. Asymptomatic. Orthopedic surgery consulted and recommended WBAT.  Vaginal discharge Wet prep positive only for WBCs. GC/Chlamydia obtained and are negative; patient is low risk. Patient with prior hysterectomy. Discussed with on-call gynecology, Dr. Ilda Basset, who recommended no further follow-up at this time.  Depression with anxiety -Continue home Abilify, Buspar, Cymbalta  Chronic diastolic CHF (congestive heart failure) (HCC) Euvolemic on admission.  Lasix held on admission. Transthoracic Echocardiogram stable. -Continue home Lasix 40 mg daily  COPD (chronic obstructive pulmonary disease) (HCC) Stable. No exacerbation at this time. -Continue albuterol PRN  CKD (chronic kidney disease), stage IV (HCC) Baseline creatinine is about 1.6-1.8. Stable.    DVT prophylaxis: Heparin subq Code Status:   Code Status: DNR Family Communication: None at bedside Disposition Plan: Discharge pending biopsy results in addition to ID recommendations for antibiotics and workup   Consultants:  Infectious disease Orthopedic surgery  Procedures:  None  Antimicrobials: Vancomycin Meropenem    Subjective: Pelvic pain is improving. Vaginal discharge is improving. No other concerns.  Objective: BP 121/87 (BP Location: Right Arm)    Pulse 61    Temp 98.2 F (36.8 C) (Oral)    Resp 18    Ht '5\' 1"'$  (1.549 m)    Wt 57.8 kg    SpO2 96%    BMI 24.08 kg/m   Examination:  General exam: Appears calm and comfortable Respiratory system: Clear to auscultation. Respiratory effort normal. Cardiovascular system: S1 & S2 heard, RRR. Gastrointestinal system: Abdomen is nondistended, soft and nontender. No organomegaly or masses felt. Normal bowel sounds heard. Central nervous system: Alert and oriented. No focal neurological deficits. Musculoskeletal: BLE edema. No calf tenderness Skin: No cyanosis. No rashes Psychiatry: Judgement and insight appear normal. Mood & affect appropriate.    Data Reviewed: I have personally reviewed following labs and imaging studies  CBC Lab Results  Component Value Date   WBC 6.5 03/23/2021   RBC 3.85 (L) 03/23/2021   HGB 11.2 (L) 03/23/2021   HCT 33.8 (L) 03/23/2021   MCV 87.8 03/23/2021   MCH 29.1 03/23/2021   PLT 676 (H) 03/23/2021   MCHC 33.1 03/23/2021   RDW 13.8 03/23/2021   LYMPHSABS 1.4 03/17/2021   MONOABS 0.5 03/17/2021   EOSABS 0.1 03/17/2021   BASOSABS 0.0 56/81/2751     Last metabolic  panel Lab Results  Component Value Date   NA 133 (L) 03/23/2021   K 3.5 03/23/2021   CL 96 (L) 03/23/2021   CO2 27 03/23/2021   BUN 24 (H) 03/23/2021   CREATININE 1.76 (H) 03/23/2021   GLUCOSE 96 03/23/2021   GFRNONAA 31 (L) 03/23/2021   GFRAA 26 (L) 08/19/2019   CALCIUM 8.9 03/23/2021   PHOS 3.7 03/17/2021   PROT 6.1 (L) 03/17/2021   ALBUMIN 2.5 (L) 03/17/2021   BILITOT 0.2 (L) 03/17/2021   ALKPHOS 75 03/17/2021   AST 14 (L) 03/17/2021   ALT 11 03/17/2021   ANIONGAP 10 03/23/2021    GFR: Estimated Creatinine Clearance: 25.4 mL/min (A) (by C-G formula based on SCr of 1.76 mg/dL (H)).  Recent Results (from the past 240 hour(s))  Blood culture (routine x 2)     Status: None   Collection Time: 03/13/21 11:23 PM   Specimen: BLOOD  Result Value Ref Range Status   Specimen Description   Final    BLOOD LEFT ANTECUBITAL Performed at [facility] [address] [place], [place] [address]    Special Requests   Final    BOTTLES DRAWN AEROBIC ONLY Blood Culture adequate volume Performed at [facility] [address], [place], [place] [address]    Culture   Final    NO GROWTH 5 DAYS Performed at [facility], [place] [address], [place], [place] [address]    Report Status 03/19/2021 FINAL  Final  Blood culture (routine x 2)     Status: None   Collection Time: 03/13/21 11:46 PM   Specimen: BLOOD  Result Value Ref Range Status   Specimen Description   Final    BLOOD BLOOD RIGHT FOREARM Performed at [facility] [address], [place], [place] [address]    Special Requests   Final    BOTTLES DRAWN AEROBIC AND ANAEROBIC Blood Culture adequate volume Performed at [facility] [address] [place], [place] [address]    Culture   Final    NO GROWTH 5 DAYS Performed at [facility], [place] [address] [place], [place] [address]    Report Status 03/19/2021 FINAL  Final  Urine Culture      Status: Abnormal   Collection Time: 03/14/21  2:09 AM   Specimen: Urine, Catheterized  Result Value Ref Range Status   Specimen Description   Final    URINE, CATHETERIZED Performed at [facility] [address] [place], [place] [address]    Special Requests   Final    NONE Performed at [facility], [place] [address] [place], [place] [address]    Culture (A)  Final    >=100,000 COLONIES/mL ESCHERICHIA COLI 60,000 COLONIES/mL PSEUDOMONAS AERUGINOSA Confirmed Extended Spectrum Beta-Lactamase Producer (ESBL).  In bloodstream infections from ESBL organisms, carbapenems are preferred over piperacillin/tazobactam. They are shown to have a lower risk of mortality.    Report Status 03/16/2021 FINAL  Final   Organism ID, Bacteria ESCHERICHIA COLI (A)  Final   Organism ID, Bacteria PSEUDOMONAS AERUGINOSA (A)  Final      Susceptibility   Escherichia coli - MIC*    AMPICILLIN >=32 RESISTANT Resistant     CEFAZOLIN >=64 RESISTANT Resistant     CEFEPIME 16 RESISTANT Resistant     CEFTRIAXONE >=64 RESISTANT Resistant     CIPROFLOXACIN >=4 RESISTANT Resistant     GENTAMICIN <=1 SENSITIVE Sensitive  IMIPENEM <=0.25 SENSITIVE Sensitive     NITROFURANTOIN <=16 SENSITIVE Sensitive     TRIMETH/SULFA >=320 RESISTANT Resistant     AMPICILLIN/SULBACTAM 8 SENSITIVE Sensitive     PIP/TAZO <=4 SENSITIVE Sensitive     * >=100,000 COLONIES/mL ESCHERICHIA COLI   Pseudomonas aeruginosa - MIC*    CEFTAZIDIME 4 SENSITIVE Sensitive     CIPROFLOXACIN 0.5 SENSITIVE Sensitive     GENTAMICIN <=1 SENSITIVE Sensitive     IMIPENEM 2 SENSITIVE Sensitive     PIP/TAZO <=4 SENSITIVE Sensitive     CEFEPIME 2 SENSITIVE Sensitive     * 60,000 COLONIES/mL PSEUDOMONAS AERUGINOSA  Resp Panel by RT-PCR (Flu A&B, Covid) Nasopharyngeal Swab     Status: None   Collection Time: 03/14/21  2:09 AM   Specimen: Nasopharyngeal Swab; Nasopharyngeal(NP) swabs in vial transport medium  Result  Value Ref Range Status   SARS Coronavirus 2 by RT PCR NEGATIVE NEGATIVE Final    Comment: (NOTE) SARS-CoV-2 target nucleic acids are NOT DETECTED.  The SARS-CoV-2 RNA is generally detectable in upper respiratory specimens during the acute phase of infection. The lowest concentration of SARS-CoV-2 viral copies this assay can detect is 138 copies/mL. A negative result does not preclude SARS-Cov-2 infection and should not be used as the sole basis for treatment or other patient management decisions. A negative result may occur with  improper specimen collection/handling, submission of specimen other than nasopharyngeal swab, presence of viral mutation(s) within the areas targeted by this assay, and inadequate number of viral copies(<138 copies/mL). A negative result must be combined with clinical observations, patient history, and epidemiological information. The expected result is Negative.  Fact Sheet for Patients:  EntrepreneurPulse.com.au  Fact Sheet for Healthcare Providers:  IncredibleEmployment.be  This test is no t yet approved or cleared by the [place] FDA and  has been authorized for detection and/or diagnosis of SARS-CoV-2 by FDA under an Emergency Use Authorization (EUA). This EUA will remain  in effect (meaning this test can be used) for the duration of the COVID-19 declaration under Section 564(b)(1) of the Act, 21 U.S.C.section 360bbb-3(b)(1), unless the authorization is terminated  or revoked sooner.       Influenza A by PCR NEGATIVE NEGATIVE Final   Influenza B by PCR NEGATIVE NEGATIVE Final    Comment: (NOTE) The Xpert Xpress SARS-CoV-2/FLU/RSV plus assay is intended as an aid in the diagnosis of influenza from Nasopharyngeal swab specimens and should not be used as a sole basis for treatment. Nasal washings and aspirates are unacceptable for Xpert Xpress SARS-CoV-2/FLU/RSV testing.  Fact Sheet for  Patients: EntrepreneurPulse.com.au  Fact Sheet for Healthcare Providers: IncredibleEmployment.be  This test is not yet approved or cleared by the [place] FDA and has been authorized for detection and/or diagnosis of SARS-CoV-2 by FDA under an Emergency Use Authorization (EUA). This EUA will remain in effect (meaning this test can be used) for the duration of the COVID-19 declaration under Section 564(b)(1) of the Act, 21 U.S.C. section 360bbb-3(b)(1), unless the authorization is terminated or revoked.  Performed at [facility], [place] [address] [place], [place] [address]   Wet prep, genital     Status: Abnormal   Collection Time: 03/15/21  1:00 PM   Specimen: Vaginal  Result Value Ref Range Status   Yeast Wet Prep HPF POC NONE SEEN NONE SEEN Final   Trich, Wet Prep NONE SEEN NONE SEEN Final   Clue Cells Wet Prep HPF POC NONE SEEN NONE SEEN Final   WBC,  Wet Prep HPF POC >=10 (A) <10 Final   Sperm NONE SEEN  Final    Comment: Performed at [facility], [place] [address] [place], [place] [address]  Aerobic Culture w Gram Stain (superficial specimen)     Status: None (Preliminary result)   Collection Time: 03/19/21  9:48 AM   Specimen: Biopsy  Result Value Ref Range Status   Specimen Description   Final    BIOPSY PUBIC BONE Performed at [facility] [address] [place], [place] [address]    Special Requests   Final    BIOPSY PUBIC BONE Performed at [facility], [place] [address], [place], [place] [address]    Gram Stain   Final    FEW WBC PRESENT, PREDOMINANTLY MONONUCLEAR NO ORGANISMS SEEN    Culture   Final    NO GROWTH < 24 HOURS Performed at [facility] [address], [place],  [address]    Report Status PENDING  Incomplete      Radiology Studies: No results found.    LOS: 9 days    Cordelia Poche, MD [facility] 03/23/2021, 10:47 AM   If 7PM-7AM, please contact night-coverage [email or web address]

## 2021-03-23 NOTE — Progress Notes (Signed)
?   ? ? ? ? ?[facility] ? ?Date of Admission:  03/13/2021   Total days of inpatient antibiotics 5 ? ?Principal Problem: ?  Infection of pelvis (HCC) ?Active Problems: ?  CKD (chronic kidney disease), stage IV (HCC) ?  COPD (chronic obstructive pulmonary disease) (HCC) ?  Chronic diastolic CHF (congestive heart failure) (HCC) ?  Depression with anxiety ?  Vaginal discharge ?  Greater trochanter fracture (HCC) ?  Thrombocytosis ?     ?    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 She reports she has pubic pain for the past three weeks.  ?-Imaging done due to complaint of pelvic pain.  Findings as below. ?-MR pelvis  w and w/o showed right greater trochanteric fracture, severe bone marrow edema  in b/l pubic bone concerning for osteomyelitis, concern for myositis throughout adductor musclelature b/l. ?-GC negative-vaginal swab on 03/15/21, HIV and RPR NR. Vaginal discharge improved(case was discussed with gynecology-no further follow up reccommended per primary documentation).  ?-Urine Cx: Ecoli ESBL, pseudomonas aeruginosa ?-Imaging is certainly concerning for infection but given her history of of urethral cancer(treated with chemotherapy and radiation per pt) malignancy is in the differential. As such recommended biopsy.  ? -IR consulted now SP Bx of pubic bone on 3/3 ? ?Recommendations:  ?-.Follow  bacterial/fungal/afb Cx(sent from [place] to [facility] 3/6.So clock starts on  3/6 for Cx) and cytology from Bx ?-Continue meropenem(Target organisms base on past urine Cx, blood Cx remained negative) ?-Will follow-up in clinic in 1 week. I placed  OPAT orders for meropenem x 6 weeks. Depending on pathology may stop antibiotics. ? ?OPAT ORDERS: ? ?Diagnosis: Pubic bone osteomyelitis ? ?Culture Result: Pending ? ?Allergies  ?Allergen Reactions  ? Metoprolol Shortness Of Breath  ? ? ? ?Discharge antibiotics to be given via PICC line: ? ?Per pharmacy protocol meropenem ? ? ?Duration: ?6 weeks ?End Date: ?04/30/21 ? ?PIC Care Per Protocol with [employer] Use: ?Home health RN for IV administration and teaching, line care and labs.   ? ?Labs weekly while on IV antibiotics: ?__ CBC with differential ?__ CMP ?__ CRP ?__ ESR ? ?__ Please pull PIC at completion of IV antibiotics ? ? ?Fax weekly labs to [phone] ? ?Clinic Follow Up Appt: ?3/13 at 3pm ? ?@ [facility] with Dr.   ? ?Microbiology:   ?Antibiotics: ?Meropenem 2/25-p ?Pip-tazo 2/25 ?Vancomycin 2/27 ?Cultures: ?Blood ?2/25 NG ?Urine ?2/25 PsA and  Ecoli ? ?3/1 Bx with path and Cx pending ? ?SUBJECTIVE: ?She is excited because it [birth year] today. Reports vaginal discharge has significantly decreased.  ?Interval No significant overnight events.  ? ?Review of Systems: ?Review of Systems  ?All other systems reviewed and are negative. ? ? ?Scheduled Meds: ? ARIPiprazole  30 mg Oral Daily  ? aspirin EC  81 mg Oral Daily  ? atorvastatin  10 mg Oral QHS  ? busPIRone  7.5 mg Oral BID  ? DULoxetine  30 mg Oral QHS  ? furosemide  40 mg Oral Daily  ? heparin  5,000 Units Subcutaneous Q8H  ? pantoprazole  40 mg Oral Daily  ? polyethylene glycol  17 g Oral BID  ? ?Continuous Infusions: ? meropenem (  MERREM) IV 1 g (03/23/21 0756)  ? ?PRN Meds:.acetaminophen **OR** acetaminophen, albuterol, HYDROcodone-acetaminophen, HYDROmorphone (DILAUDID) injection, lidocaine (PF), lip balm, methocarbamol, ondansetron **OR** ondansetron (ZOFRAN) IV ?Allergies  ?Allergen Reactions  ? Metoprolol Shortness Of Breath  ? ? ?OBJECTIVE: ?Vitals:  ? 03/22/21 1251 03/22/21 2001 03/23/21 0503 03/23/21 1330  ?BP: 118/80 106/71 121/87 102/80   ?Pulse: 79 71 61 75  ?Resp: _0 ?Temp: 98 ?F (36.7 ?C) 99 ?F (37.2 ?C) 98.2 ?F (36.8 ?C) 98 ?F (36.7 ?C)  ?TempSrc:  Oral Oral   ?SpO2: 98% 99% 96% 98%  ?Weight:      ?Height:      ? ?Body mass index is 24.08 kg/m?. ? ?Physical Exam ?Constitutional:   ?   Appearance: Normal appearance.  ?HENT:  ?   Head: Normocephalic and atraumatic.  ?   Right Ear: Tympanic membrane normal.  ?   Left Ear: Tympanic membrane normal.  ?   Nose: Nose normal.  ?   Mouth/Throat:  ?   Mouth: Mucous membranes are moist.  ?Eyes:  ?   Extraocular Movements: Extraocular movements intact.  ?   Conjunctiva/sclera: Conjunctivae normal.  ?   Pupils: Pupils are equal, round, and reactive to light.  ?Cardiovascular:  ?   Rate and Rhythm: Normal rate and regular rhythm.  ?   Heart sounds: No murmur heard. ?  No friction rub. No gallop.  ?Pulmonary:  ?   Effort: Pulmonary effort is normal.  ?   Breath sounds: Normal breath sounds.  ?Abdominal:  ?   General: Abdomen is flat.  ?   Palpations: Abdomen is soft.  ?Genitourinary: ?   Comments: RLQ urostomy ?Musculoskeletal:     ?   General: Normal range of motion.  ?Skin: ?   General: Skin is warm and dry.  ?Neurological:  ?   General: No focal deficit present.  ?   Mental Status: She is alert and oriented to person, place, and time.  ?Psychiatric:     ?   Mood and Affect: Mood normal.  ? ? ? ? ?Lab Results ?Lab Results  ?Component Value Date  ? WBC 6.5 03/23/2021  ? HGB 11.2 (L) 03/23/2021  ? HCT 33.8 (L) 03/23/2021  ? MCV 87.8 03/23/2021  ? PLT 676 (H) 03/23/2021  ?  ?Lab Results  ?Component Value Date  ? CREATININE 1.76 (H) 03/23/2021  ? BUN 24 (H) 03/23/2021  ? NA 133 (L) 03/23/2021  ? K 3.5 03/23/2021  ? CL 96 (L) 03/23/2021  ? CO2 27 03/23/2021  ?  ?Lab Results  ?Component Value Date  ? ALT 11 03/17/2021  ? AST 14 (L) 03/17/2021  ? ALKPHOS 75 03/17/2021  ? BILITOT 0.2 (L) 03/17/2021  ?  ? ? ? ? ?Laurice Record, MD ?[facility] ?[facility] ?03/23/2021,  3:04 PM  ?

## 2021-03-23 NOTE — Progress Notes (Signed)
?   03/23/21 1400  ?Pain Assessment  ?Pain Assessment 0-10  ?Pain Score 3  ?Pain Location pelvis  ?Mobility  ?Activity Ambulated with assistance in hallway  ?Level of Assistance Modified independent, requires aide device or extra time  ?Assistive Device None  ?Distance Ambulated (ft) 400 ft  ?Activity Response Tolerated well  ?$Mobility charge 1 Mobility  ? ?Pt agreeable to mobilize this afternoon. Ambulated about 448f in hall with RW, tolerated well. No complaints. Left pt in bed, call bell at side. RN/NT notified of session.  ? ? ?KRudell Cobb?Mobility Specialist ?Acute Rehab Services ?Office: [phone]? ?

## 2021-03-23 NOTE — Progress Notes (Addendum)
PHARMACY CONSULT NOTE FOR: ? ?OUTPATIENT  PARENTERAL ANTIBIOTIC THERAPY (OPAT) ? ?Indication: Pubic osteo/myositis ?Regimen: Meropenem 1g IV every 12 hours ?End date: 04/30/21 ? ?IV antibiotic discharge orders are pended. ?To discharging provider:  please sign these orders via discharge navigator,  ?Select New Orders & click on the button choice - Manage This Unsigned Work.  ?  ? ?Thank you for allowing pharmacy to be a part of this patient?s care. ? ?Alycia Rossetti, PharmD, BCPS ?Infectious Diseases Clinical Pharmacist ?03/23/2021 1:58 PM  ? ?**Pharmacist phone directory can now be found on [email or web address] ([email or web address]).  Listed under [place].  ?

## 2021-03-23 NOTE — Assessment & Plan Note (Addendum)
Likely reactive 

## 2021-03-24 DIAGNOSIS — C680 Malignant neoplasm of urethra: Secondary | ICD-10-CM | POA: Diagnosis present

## 2021-03-24 DIAGNOSIS — K521 Toxic gastroenteritis and colitis: Secondary | ICD-10-CM | POA: Diagnosis not present

## 2021-03-24 DIAGNOSIS — T3695XA Adverse effect of unspecified systemic antibiotic, initial encounter: Secondary | ICD-10-CM | POA: Diagnosis not present

## 2021-03-24 LAB — BASIC METABOLIC PANEL
Anion gap: 11 (ref 5–15)
BUN: 25 mg/dL — ABNORMAL HIGH (ref 8–23)
CO2: 30 mmol/L (ref 22–32)
Calcium: 9.2 mg/dL (ref 8.9–10.3)
Chloride: 91 mmol/L — ABNORMAL LOW (ref 98–111)
Creatinine, Ser: 1.76 mg/dL — ABNORMAL HIGH (ref 0.44–1.00)
GFR, Estimated: 31 mL/min — ABNORMAL LOW (ref >60)
Glucose, Bld: 108 mg/dL — ABNORMAL HIGH (ref 70–99)
Potassium: 3.6 mmol/L (ref 3.5–5.1)
Sodium: 132 mmol/L — ABNORMAL LOW (ref 135–145)

## 2021-03-24 LAB — CBC
HCT: 34.5 % — ABNORMAL LOW (ref 36.0–46.0)
Hemoglobin: 11.1 g/dL — ABNORMAL LOW (ref 12.0–15.0)
MCH: 28.7 pg (ref 26.0–34.0)
MCHC: 32.2 g/dL (ref 30.0–36.0)
MCV: 89.1 fL (ref 80.0–100.0)
Platelets: 738 10*3/uL — ABNORMAL HIGH (ref 150–400)
RBC: 3.87 MIL/uL (ref 3.87–5.11)
RDW: 13.8 % (ref 11.5–15.5)
WBC: 7.1 10*3/uL (ref 4.0–10.5)
nRBC: 0 % (ref 0.0–0.2)

## 2021-03-24 LAB — ACID FAST SMEAR (AFB, MYCOBACTERIA): Acid Fast Smear: NEGATIVE

## 2021-03-24 MED ORDER — SACCHAROMYCES BOULARDII 250 MG PO CAPS
250.0000 mg | ORAL_CAPSULE | Freq: Two times a day (BID) | ORAL | Status: DC
Start: 2021-03-24 — End: 2021-03-25
  Administered 2021-03-24 – 2021-03-25 (×3): 250 mg via ORAL
  Filled 2021-03-24 (×3): qty 1

## 2021-03-24 MED ORDER — FAMOTIDINE 20 MG PO TABS
10.0000 mg | ORAL_TABLET | Freq: Every day | ORAL | Status: DC
  Administered 2021-03-24 – 2021-03-25 (×2): 10 mg via ORAL
  Filled 2021-03-24 (×2): qty 1

## 2021-03-24 MED ORDER — FUROSEMIDE 20 MG PO TABS
20.0000 mg | ORAL_TABLET | Freq: Every day | ORAL | Status: DC
Start: 2021-03-25 — End: 2021-03-24

## 2021-03-24 NOTE — Assessment & Plan Note (Signed)
Status post radical cystectomy, urethrectomy, and creation of ileal conduit. ?Follows up with [facility] and IR for mgmt of her ureteral stents; maintain outpt follow up ?

## 2021-03-24 NOTE — Progress Notes (Signed)
? ? ?Referring Physician(s): ?Lavina Hamman, MD ?Laurice Record, MD (ID) ? ?Supervising Physician: Markus Daft ? ?Patient Status:  [facility] - In-pt ? ?Chief Complaint: ? ?Osteomyelitis ? ?Brief History: ? ?Shelia Chan is a 66 y.o. female w/ PMH of urethral cancer s/p radical cystectomy, urethrectomy, ileal conduit, COPD, CHF, CKD stage IV and depression w/ anxiety.  ? ?She presented to the ED on 03/13/21 c/o fevers and lower abdominal pain.  ? ?MR pelvis 03/16/21 showed bone marrow edema in pubic bone concerning for osteomyelitis. ? ?She underwent image guided biopsy by Dr. Serafina Royals on 03/19/21. ? ?Pathology revealed acute osteomyelitis. ? ?She needs central venous access for long term IV antibiotics. ? ?She cannot have a PICC due to CKD and we are asked to place a tunneled central line.  ? ?Allergies: ?Metoprolol ? ?Medications: ?Prior to Admission medications   ?Medication Sig Start Date End Date Taking? Authorizing Provider  ?acetaminophen (TYLENOL) 500 MG tablet Take 500-1,000 mg by mouth every 6 (six) hours as needed for moderate pain or fever.   Yes [provider]  ?albuterol (VENTOLIN HFA) 108 (90 Base) MCG/ACT inhaler Inhale 1 puff into the lungs every 6 (six) hours as needed for shortness of breath. 05/22/20  Yes [provider]  ?ARIPiprazole (ABILIFY) 30 MG tablet Take 30 mg by mouth daily. 03/19/19  Yes [provider]  ?aspirin EC 81 MG tablet Take 81 mg by mouth daily.   Yes [provider]  ?atorvastatin (LIPITOR) 10 MG tablet Take 10 mg by mouth at bedtime. 02/07/21  Yes [provider]  ?B Complex-C (B-COMPLEX WITH VITAMIN C) tablet Take 1 tablet by mouth daily.   Yes [provider]  ?busPIRone (BUSPAR) 7.5 MG tablet Take 7.5 mg by mouth 2 (two) times daily. 02/22/21  Yes [provider]  ?calcitRIOL (ROCALTROL) 0.25 MCG capsule Take 0.25 mcg by mouth daily. 02/12/21  Yes [provider]  ?Cholecalciferol (VITAMIN D-3) 25 MCG (1000 UT) CAPS  Take 2,000 Units by mouth daily.   Yes [provider]  ?DULoxetine (CYMBALTA) 30 MG capsule Take 30 mg by mouth at bedtime. 06/21/20  Yes [provider]  ?ferrous sulfate 324 MG TBEC Take 324 mg by mouth daily with breakfast.   Yes [provider]  ?furosemide (LASIX) 40 MG tablet Take 40 mg by mouth daily.   Yes [provider]  ?KLOR-CON M20 20 MEQ tablet TAKE 1 TABLET BY MOUTH EVERY DAY ?Patient taking differently: Take 20 mEq by mouth daily. 10/28/20  Yes O'Neal, Cassie Freer, MD  ?methocarbamol (ROBAXIN) 500 MG tablet Take 500 mg by mouth 3 (three) times daily as needed for muscle spasms. 03/08/21  Yes [provider]  ?Multiple Vitamin (MULTIVITAMIN ADULT PO) Take 1 tablet by mouth daily.   Yes [provider]  ?pantoprazole (PROTONIX) 40 MG tablet Take 40 mg by mouth daily. 01/28/21  Yes [provider]  ?Probiotic Product (PROBIOTIC PO) Take 1 capsule by mouth daily.   Yes [provider]  ?vitamin C (ASCORBIC ACID) 500 MG tablet Take 500 mg by mouth daily.   Yes [provider]  ? ? ? ?Vital Signs: ?BP 128/85 (BP Location: Right Arm)   Pulse 65   Temp 98.3 ?F (36.8 ?C) (Oral)   Resp 18   Ht '5\' 1"'$  (1.549 m)   Wt 127 lb 6.8 oz (57.8 kg)   SpO2 98%   BMI 24.08 kg/m?  ? ?Physical Exam ?Vitals reviewed.  ?Constitutional:   ?  Appearance: Normal appearance.  ?Cardiovascular:  ?   Rate and Rhythm: Normal rate.  ?Pulmonary:  ?   Effort: Pulmonary effort is normal. No respiratory distress.  ?Abdominal:  ?   Palpations: Abdomen is soft.  ?Musculoskeletal:     ?   General: Normal range of motion.  ?Neurological:  ?   General: No focal deficit present.  ?   Mental Status: She is alert and oriented to person, place, and time.  ?Psychiatric:     ?   Mood and Affect: Mood normal.     ?   Behavior: Behavior normal.     ?   Thought Content: Thought content normal.     ?   Judgment: Judgment normal.  ? ? ?Imaging: ?No results  found. ? ?Labs: ? ?CBC: ?Recent Labs  ?  03/17/21 ?0424 03/20/21 ?0406 03/23/21 ?0438 03/24/21 ?0446  ?WBC 8.4 7.6 6.5 7.1  ?HGB 9.3* 9.8* 11.2* 11.1*  ?HCT 28.4* 31.4* 33.8* 34.5*  ?PLT 440* 541* 676* 738*  ? ? ?COAGS: ?No results for input(s): INR, APTT in the last 8760 hours. ? ?BMP: ?Recent Labs  ?  03/17/21 ?0424 03/20/21 ?0406 03/21/21 ?0421 03/23/21 ?4287 03/24/21 ?6811  ?NA 137 133*  --  133* 132*  ?K 3.8 3.5  --  3.5 3.6  ?CL 107 101  --  96* 91*  ?CO2 22 22  --  27 30  ?GLUCOSE 96 119*  --  96 108*  ?BUN 16 15  --  24* 25*  ?CALCIUM 8.3* 8.2*  --  8.9 9.2  ?CREATININE 1.58* 1.67* 1.75* 1.76* 1.76*  ?GFRNONAA 36* 34* 32* 31* 31*  ? ? ?LIVER FUNCTION TESTS: ?Recent Labs  ?  11/08/20 ?1223 03/13/21 ?2323 03/17/21 ?0424  ?BILITOT 0.6 1.0 0.2*  ?AST 17 18 14*  ?ALT '12 14 11  '$ ?ALKPHOS 102 101 75  ?PROT 8.0 8.2* 6.1*  ?ALBUMIN 3.9 3.7 2.5*  ? ? ?Assessment and Plan: ? ?Osteomyelitis with need for central venous access for long term IV antibiotics. ? ?Will proceed with image guided placement of a tunneled central venous catheter tomorrow. (IR room is down for repair today). ? ?Risks and benefits of PICC placement were discussed with the patient including, but not limited to bleeding, infection, deep vein thrombosis, or fibrin sheath development and need for additional procedures. ? ?All of the patient's questions were answered, patient is agreeable to proceed. ?Consent signed and in chart. ? ? ?Electronically Signed: ?Murrell Redden, PA-C ?03/24/2021, 1:24 PM ? ? ? ?I spent a total of 15 Minutes at the the patient's bedside AND on the patient's hospital floor or unit, greater than 50% of which was counseling/coordinating care for tunneled central venous catheter placement. ? ? ? ? ? ?

## 2021-03-24 NOTE — Assessment & Plan Note (Signed)
Add probiotics, change Protonix to Pepcid and hold Lasix to avoid dehydration. ?

## 2021-03-24 NOTE — Evaluation (Signed)
Physical Therapy Evaluation-1x ?Patient Details ?Name: Shelia Chan ?MRN: [id number] ?DOB: April 07, 1955 ?Today's Date: 03/24/2021 ? ?History of Present Illness ? 66 yo female admitted with infection of pelvis, stress fx R hip GT, UTI, bone biopsy by IR 3/3. Hx of CHF, COPD, CKD, uretheral cancer, multiple stent exchanges  ?Clinical Impression ? On eval, pt was Mod Ind with mobility. She ambulated in the hallway without assistance or a device. Pain rated 4/10 with activity. Pt reports she has been mobilizing in room unassisted. No acute PT needs. 1x eval. Will sign off.    ?   ? ?Recommendations for follow up therapy are one component of a multi-disciplinary discharge planning process, led by the attending physician.  Recommendations may be updated based on patient status, additional functional criteria and insurance authorization. ? ?Follow Up Recommendations No PT follow up ? ?  ?Assistance Recommended at Discharge None  ?Patient can return home with the following ?   ? ?  ?Equipment Recommendations None recommended by PT  ?Recommendations for Other Services ?    ?  ?Functional Status Assessment Patient has had a recent decline in their functional status and demonstrates the ability to make significant improvements in function in a reasonable and predictable amount of time.  ? ?  ?Precautions / Restrictions Precautions ?Precautions: None ?Restrictions ?Weight Bearing Restrictions: No  ? ?  ? ?Mobility ? Bed Mobility ?Overal bed mobility: Modified Independent ?  ?  ?  ?  ?  ?  ?  ?  ? ?Transfers ?Overall transfer level: Modified independent ?  ?  ?  ?  ?  ?  ?  ?  ?  ?  ? ?Ambulation/Gait ?Ambulation/Gait assistance: Modified independent (Device/Increase time) ?Gait Distance (Feet): 150 Feet ?Assistive device: None ?Gait Pattern/deviations: Step-through pattern ?  ?  ?  ?General Gait Details: Pt reports some increase in pain with ambulation-up to 4/10 from 2/10 at rest ? ?Stairs ?  ?  ?  ?  ?  ? ?Wheelchair Mobility ?   ? ?Modified Rankin (Stroke Patients Only) ?  ? ?  ? ?Balance Overall balance assessment: Mild deficits observed, not formally tested ?  ?  ?  ?  ?  ?  ?  ?  ?  ?  ?  ?  ?  ?  ?  ?  ?  ?  ?   ? ? ? ?Pertinent Vitals/Pain Pain Assessment ?Pain Assessment: 0-10 ?Pain Score: 4  ?Pain Location: pelvis with activity ?Pain Descriptors / Indicators: Discomfort, Sore ?Pain Intervention(s): Monitored during session  ? ? ?Home Living Family/patient expects to be discharged to:: Private residence ?Living Arrangements: Children;Other (Comment) ?Available Help at Discharge: Family ?Type of Home: House ?Home Access: Level entry ?  ?  ?Alternate Level Stairs-Number of Steps: does not have to perform steps unless she wants to go upstairs to ex son-in-law's ?Home Layout: Multi-level ?Home Equipment: Rolling Walker (2 wheels);BSC/3in1 ?   ?  ?Prior Function Prior Level of Function : Independent/Modified Independent ?  ?  ?  ?  ?  ?  ?Mobility Comments: pt reports no AD use PTA ?ADLs Comments: Pt reports independence with ADLs ?  ? ? ?Hand Dominance  ?   ? ?  ?Extremity/Trunk Assessment  ? Upper Extremity Assessment ?Upper Extremity Assessment: Overall WFL for tasks assessed ?  ? ?Lower Extremity Assessment ?Lower Extremity Assessment: Generalized weakness ?  ? ?Cervical / Trunk Assessment ?Cervical / Trunk Assessment: Normal  ?Communication  ? Communication: No difficulties  ?  Cognition Arousal/Alertness: Awake/alert ?Behavior During Therapy: [facility] for tasks assessed/performed ?Overall Cognitive Status: Within Functional Limits for tasks assessed ?  ?  ?  ?  ?  ?  ?  ?  ?  ?  ?  ?  ?  ?  ?  ?  ?  ?  ?  ? ?  ?General Comments   ? ?  ?Exercises    ? ?Assessment/Plan  ?  ?PT Assessment Patient does not need any further PT services  ?PT Problem List   ? ?   ?  ?PT Treatment Interventions     ? ?PT Goals (Current goals can be found in the Care Plan section)  ?Acute Rehab PT Goals ?Patient Stated Goal: home! ?PT Goal Formulation: All  assessment and education complete, DC therapy ? ?  ?Frequency   ?  ? ? ?Co-evaluation   ?  ?  ?  ?  ? ? ?  ?AM-PAC PT "6 Clicks" Mobility  ?Outcome Measure Help needed turning from your back to your side while in a flat bed without using bedrails?: None ?Help needed moving from lying on your back to sitting on the side of a flat bed without using bedrails?: None ?Help needed moving to and from a bed to a chair (including a wheelchair)?: None ?Help needed standing up from a chair using your arms (e.g., wheelchair or bedside chair)?: None ?Help needed to walk in hospital room?: None ?Help needed climbing 3-5 steps with a railing? : None ?6 Click Score: 24 ? ?  ?End of Session   ?Activity Tolerance: Patient tolerated treatment well ?Patient left: in bed;with call bell/phone within reach ?  ?  ?  ? ?Time: 1610-9604 ?PT Time Calculation (min) (ACUTE ONLY): 9 min ? ? ?Charges:   PT Evaluation ?$PT Eval Low Complexity: 1 Low ?  ?  ?   ? ? ? ? ? ?Ashden Sonnenberg P, PT ?Acute Rehabilitation  ?Office: [phone] ?Pager: [phone] ? ? ? ?

## 2021-03-24 NOTE — TOC Transition Note (Signed)
Transition of Care (TOC) - CM/SW Discharge Note ? ?Patient Details  ?Name: Shelia Chan ?MRN: [id number] ?Date of Birth: 04/24/1955 ? ?Transition of Care (TOC) CM/SW Contact:  ?Sherie Don, LCSW ?Phone Number: ?03/24/2021, 11:08 AM ? ?Clinical Narrative: Patient will need to discharge home on 6 weeks of antibiotics. Patient was referred to [facility] with [facility] for IV antibiotics and Cindie with [facility] for [facility]. CSW updated patient regarding referrals. TOC signing off. ? ?Final next level of care: [place] ?Barriers to Discharge: Barriers Resolved ? ?Patient Goals and CMS Choice ?Patient states their goals for this hospitalization and ongoing recovery are:: Return home ?CMS Medicare.gov Compare Post Acute Care list provided to:: Patient ?Choice offered to / list presented to : Patient ? ?Discharge Plan and Services          ?DME Arranged: N/A ?DME Agency: NA ?HH Arranged: RN, IV Antibiotics ?[place] Agency: [facility], [facility] ?Date HH Agency Contacted: 03/24/21 ?Representative spoke with at [place]: Jeannene Patella (Ysidro Evert), Madilyn Hook Alvis Lemmings) ? ?Readmission Risk Interventions ?No flowsheet data found. ? ?

## 2021-03-24 NOTE — Progress Notes (Signed)
?Progress Note ? ? ?Patient: Shelia Chan QBH:[id number] DOB: Nov 11, 1955 DOA: 03/13/2021     Hospitalization day: 10 ?DOS: the patient was seen and examined on 03/24/2021 ?  ?Brief hospital course: ?66 yo with hx urethral cancer s/p radical cystectomy, urethrectomy, and ileal conduit, COPD, HFpEF, depression/anxiety who presents with fevers and abdominal pain concerning for UTI.  Notably with history of pseudomonas and ESBL in the past. ?2/26 admitted with fever and lower abdominal pain ?2/28 MRI pelvis concerning for osteomyelitis, myositis as well as possible right femoral neck osteo.  MRI with and without contrast MSK protocol performed ?3/1 ID consulted cultures growing E. coli MDRO and Pseudomonas.  Orthopedic Dr. Ninfa Linden consulted for stress fracture of right hip.  Trochanter.  Recommend no intervention and weightbearing as tolerated. ?3/2 echocardiogram EF 65 to 70%, no vegetation. ?3/3 bone biopsy performed due to history of urethral cancer. ?3/8 IR consulted for PICC line placement due to history of CKD.  Medically stable. ? ?Assessment and Plan: ?* Pubic bone Osteomyelitis, bilaterally, with myositis ?MRI concerning for osteomyelitis. ?Infectious disease consulted and have recommended IR consult for biopsy and cultures since patient has a history of urethral cancer.  ?Patient also with recurrent positive urine culture, complicated by ileal conduit history. ?E. Coli (MDR) and Pseudomonas are recurrent pathogens. ?Blood cultures with no growth.  ?Transthoracic Echocardiogram with stable valve pathologies.  ?IR consulted and performed bone biopsy on 3/3. ?ID recommends meropenem on discharge for 6 weeks.  Follow-up in the clinic in 1 week.  ID will follow-up on bacterial fungal and AFB cultures. ?Home tomorrow after PICC line placement. ? ?Greater trochanter fracture ([place]) ?Right sided. Non-displaced. Asymptomatic.  ?Unknown mechanism injury.  ?Incidentally identified on MRI pelvis.  ?Orthopedic surgery  consulted and recommended WBAT. ?No intervention recommended for now. ? ?Vaginal discharge ?Wet prep positive only for WBCs.  ?GC/Chlamydia obtained and are negative; patient is low risk.  ?Patient with prior hysterectomy.  ?Prior provider, discussed with on-call gynecology, Dr. Ilda Basset, who recommended no further follow-up at this time. ? ?Thrombocytosis ?Likely reactive. Platelets are rising slightly. ? ?Antibiotic-associated diarrhea ?Add probiotics, change Protonix to Pepcid and hold Lasix to avoid dehydration. ? ?Squamous cell carcinoma of the urethra ?Status post radical cystectomy, urethrectomy, and creation of ileal conduit. ?Follows up with [facility] and IR for mgmt of her ureteral stents; maintain outpt follow up ? ?Depression with anxiety ?-Continue home Abilify, Buspar, Cymbalta ? ?Chronic diastolic CHF (congestive heart failure) ([place]) ?Euvolemic on admission.  ?Lasix held on admission.  ?Transthoracic Echocardiogram stable. ?Patient reports diarrhea therefore we will be holding Lasix for now. ? ?COPD (chronic obstructive pulmonary disease) ([place]) ?Stable. No exacerbation at this time. ?-Continue albuterol PRN ? ?CKD (chronic kidney disease), stage IV ([place]) ?Baseline creatinine is about 1.6-1.8. Stable. ? ?Subjective: No nausea no vomiting no fever no chills.  Pain well controlled.  Reports some diarrhea.  No blood in the stool abdominal pain. ? ?Physical Exam: ?Vitals:  ? 03/23/21 1330 03/23/21 2037 03/24/21 0527 03/24/21 1331  ?BP: 102/80 115/77 128/85 101/75  ?Pulse: 75 77 65 71  ?Resp: '16 18 18 14  '$ ?Temp: 98 ?F (36.7 ?C) 99.5 ?F (37.5 ?C) 98.3 ?F (36.8 ?C) 98.3 ?F (36.8 ?C)  ?TempSrc:  Oral Oral   ?SpO2: 98% 98% 98% 97%  ?Weight:      ?Height:      ? ?General: Appear in mild distress; no visible Abnormal Neck Mass Or lumps, Conjunctiva normal ?Cardiovascular: S1 and S2 Present, aortic systolic Murmur, ?Respiratory:  good respiratory effort, Bilateral Air entry present and CTA, no Crackles, no  wheezes ?Abdomen: Bowel Sound present ?Extremities: no Pedal edema ?Neurology: alert and oriented to time, place, and person ?Gait not checked due to patient safety concerns  ? ?Data Reviewed: ? I have Reviewed nursing notes, Vitals, and Lab results since pt's last encounter. Pertinent lab results CBC BMP ?I have reviewed the last note from ID, orthopedics, IR,  ?I have discussed pt's care plan and test results with IR.  ? ?Family Communication: None at bedside ? ?Disposition: ?Status is: Inpatient ? ?Remains inpatient appropriate because: Need outpatient IV antibiotics and PICC line for that.  IR room is down today.  Discharge tomorrow after PICC line placement. ? ?Author: ?Berle Mull, MD ?03/24/2021 4:13 PM ? ?For on call review [email or web address]. ?

## 2021-03-25 ENCOUNTER — Inpatient Hospital Stay (HOSPITAL_COMMUNITY): Payer: Medicare Other

## 2021-03-25 HISTORY — PX: IR PERC TUN PERIT CATH WO PORT S&I /IMAG: IMG2327

## 2021-03-25 LAB — AEROBIC CULTURE W GRAM STAIN (SUPERFICIAL SPECIMEN): Culture: NO GROWTH

## 2021-03-25 MED ORDER — MEROPENEM IV (FOR PTA / DISCHARGE USE ONLY): 1.0000 g | Freq: Two times a day (BID) | INTRAVENOUS | 0 refills | Status: DC

## 2021-03-25 MED ORDER — HYDROCODONE-ACETAMINOPHEN 5-325 MG PO TABS: 1.0000 | ORAL_TABLET | ORAL | 0 refills | Status: DC | PRN

## 2021-03-25 MED ORDER — SACCHAROMYCES BOULARDII 250 MG PO CAPS: 250.0000 mg | ORAL_CAPSULE | Freq: Two times a day (BID) | ORAL | 0 refills | Status: DC

## 2021-03-25 MED ORDER — HEPARIN SOD (PORK) LOCK FLUSH 100 UNIT/ML IV SOLN
INTRAVENOUS | Status: AC
  Filled 2021-03-25: qty 5

## 2021-03-25 MED ORDER — LIDOCAINE-EPINEPHRINE 1 %-1:100000 IJ SOLN
INTRAMUSCULAR | Status: AC
  Filled 2021-03-25: qty 1

## 2021-03-25 NOTE — Discharge Summary (Signed)
Physician Discharge Summary   Patient: Shelia Chan MRN: [id number] DOB: [birth year]  Admit date:     03/13/2021  Discharge date: 03/25/2021  Discharge Physician: Berle Mull  PCP: [name], MD  Recommendations at discharge: Follow-up with PCP as well as infectious disease as recommended.  Discharge Diagnoses: Principal Problem:   Pubic bone Osteomyelitis, bilaterally, with myositis Active Problems:   Greater trochanter fracture (HCC)   Vaginal discharge   Thrombocytosis   CKD (chronic kidney disease), stage IV (HCC)   COPD (chronic obstructive pulmonary disease) (HCC)   Chronic diastolic CHF (congestive heart failure) (HCC)   Depression with anxiety   Squamous cell carcinoma of the urethra   Antibiotic-associated diarrhea   Hospital Course: 66 yo with hx urethral cancer s/p radical cystectomy, urethrectomy, and ileal conduit, COPD, HFpEF, depression/anxiety who presents with fevers and abdominal pain concerning for UTI.  Notably with history of pseudomonas and ESBL in the past. 2/26 admitted with fever and lower abdominal pain 2/28 MRI pelvis concerning for osteomyelitis, myositis as well as possible right femoral neck osteo.  MRI with and without contrast MSK protocol performed 3/1 ID consulted cultures growing E. coli MDRO and Pseudomonas.  Orthopedic Dr. Ninfa Linden consulted for stress fracture of right hip.  Trochanter.  Recommend no intervention and weightbearing as tolerated. 3/2 echocardiogram EF 65 to 70%, no vegetation. 3/3 bone biopsy performed due to history of urethral cancer. 3/8 IR consulted for PICC line placement due to history of CKD.  Medically stable.  Assessment and Plan: * Pubic bone Osteomyelitis, bilaterally, with myositis MRI concerning for osteomyelitis. Infectious disease consulted and have recommended IR consult for biopsy and cultures since patient has a history of urethral cancer.  Patient also with recurrent positive urine culture,  complicated by ileal conduit history. E. Coli (MDR) and Pseudomonas are recurrent pathogens. Blood cultures with no growth.  Transthoracic Echocardiogram with stable valve pathologies.  IR consulted and performed bone biopsy on 3/3. ID recommends meropenem on discharge for 6 weeks.  Follow-up in the clinic in 1 week.  ID will follow-up on bacterial fungal and AFB cultures.  Greater trochanter fracture (HCC) Right sided. Non-displaced. Asymptomatic.  Unknown mechanism injury.  Incidentally identified on MRI pelvis.  Orthopedic surgery consulted and recommended WBAT. No intervention recommended for now.  Vaginal discharge Wet prep positive only for WBCs.  GC/Chlamydia obtained and are negative; patient is low risk.  Patient with prior hysterectomy.  Prior provider, discussed with on-call gynecology, Dr. Ilda Basset, who recommended no further follow-up at this time.  Thrombocytosis Likely reactive.  Antibiotic-associated diarrhea Add probiotics, change Protonix to Pepcid and hold Lasix to avoid dehydration.  Squamous cell carcinoma of the urethra Status post radical cystectomy, urethrectomy, and creation of ileal conduit. Follows up with [facility] and IR for mgmt of her ureteral stents; maintain outpt follow up  Depression with anxiety -Continue home Abilify, Buspar, Cymbalta  Chronic diastolic CHF (congestive heart failure) ([place]) Euvolemic on admission.  Lasix held on admission.  Transthoracic Echocardiogram stable. Patient reports diarrhea therefore we will be holding Lasix for now.  COPD (chronic obstructive pulmonary disease) (HCC) Stable. No exacerbation at this time. -Continue albuterol PRN  CKD (chronic kidney disease), stage IV (HCC) Baseline creatinine is about 1.6-1.8. Stable.   Pain control - [employer] Controlled Substance Reporting System database was reviewed. and patient was instructed, not to drive, operate heavy machinery, perform activities at  heights, swimming or participation in water activities or provide baby-sitting services while on Pain, Sleep  and Anxiety Medications; until their outpatient Physician has advised to do so again. Also recommended to not to take more than prescribed Pain, Sleep and Anxiety Medications.   Consultants: Infectious disease Orthopedics IR Procedures performed:  CT-guided biopsy of pubic bone IR PCN exchange bilateral IR guided central line placement Echocardiogram DISCHARGE MEDICATION: Allergies as of 03/25/2021       Reactions   Metoprolol Shortness Of Breath        Medication List     TAKE these medications    acetaminophen 500 MG tablet Commonly known as: TYLENOL Take 500-1,000 mg by mouth every 6 (six) hours as needed for moderate pain or fever.   albuterol 108 (90 Base) MCG/ACT inhaler Commonly known as: VENTOLIN HFA Inhale 1 puff into the lungs every 6 (six) hours as needed for shortness of breath.   ARIPiprazole 30 MG tablet Commonly known as: ABILIFY Take 30 mg by mouth daily.   aspirin EC 81 MG tablet Take 81 mg by mouth daily.   atorvastatin 10 MG tablet Commonly known as: LIPITOR Take 10 mg by mouth at bedtime.   B-complex with vitamin C tablet Take 1 tablet by mouth daily.   busPIRone 7.5 MG tablet Commonly known as: BUSPAR Take 7.5 mg by mouth 2 (two) times daily.   calcitRIOL 0.25 MCG capsule Commonly known as: ROCALTROL Take 0.25 mcg by mouth daily.   DULoxetine 30 MG capsule Commonly known as: CYMBALTA Take 30 mg by mouth at bedtime.   ferrous sulfate 324 MG Tbec Take 324 mg by mouth daily with breakfast.   furosemide 40 MG tablet Commonly known as: LASIX Take 40 mg by mouth daily.   HYDROcodone-acetaminophen 5-325 MG tablet Commonly known as: NORCO/VICODIN Take 1 tablet by mouth every 4 (four) hours as needed for severe pain or moderate pain.   Klor-Con M20 20 MEQ tablet Generic drug: potassium chloride SA TAKE 1 TABLET BY MOUTH EVERY  DAY What changed: how much to take   meropenem  IVPB Commonly known as: MERREM Inject 1 g into the vein every 12 (twelve) hours. Indication:  Pubic osteo First Dose: Yes Last Day of Therapy:  04/30/21 Labs - Once weekly:  CBC/D and BMP, Labs - Every other week:  ESR and CRP Method of administration: Mini-Bag Plus / Gravity Method of administration may be changed at the discretion of home infusion pharmacist based upon assessment of the patient and/or caregiver's ability to self-administer the medication ordered.   methocarbamol 500 MG tablet Commonly known as: ROBAXIN Take 500 mg by mouth 3 (three) times daily as needed for muscle spasms.   MULTIVITAMIN ADULT PO Take 1 tablet by mouth daily.   pantoprazole 40 MG tablet Commonly known as: PROTONIX Take 40 mg by mouth daily.   PROBIOTIC PO Take 1 capsule by mouth daily.   saccharomyces boulardii 250 MG capsule Commonly known as: FLORASTOR Take 1 capsule (250 mg total) by mouth 2 (two) times daily.   vitamin C 500 MG tablet Commonly known as: ASCORBIC ACID Take 500 mg by mouth daily.   Vitamin D-3 25 MCG (1000 UT) Caps Take 2,000 Units by mouth daily.               Discharge Care Instructions  (From admission, onward)           Start     Ordered   03/25/21 0000  Change dressing on IV access line weekly and PRN  (Home infusion instructions - [facility] )  03/25/21 1140            Follow-up Information     [facility] Follow up.   Why: [facility] will provide the IV antibiotics.        [facility] Follow up.   Specialty: Home Health Services Why: Alvis Lemmings will provide nursing for labs and PICC line maintenance. Contact information: [address] [place] [address] [phone]         [name], MD. Schedule an appointment as soon as possible for a visit in 1 week(s).   Specialty: Family Medicine Contact information: [place] [address] [phone]         Laurice Record, MD. Schedule an appointment as soon as possible for a visit in 1 week(s).   Specialty: Infectious Diseases Why: with CBC and BMP Contact information: [address] [place] [address] [phone]                Disposition: Home health Diet recommendation:  Discharge Diet Orders (From admission, onward)     Start     Ordered   03/25/21 0000  Diet - low sodium heart healthy        03/25/21 1140           Discharge Exam: Filed Weights   03/14/21 1320  Weight: 57.8 kg   General: Appear in no distress; no visible Abnormal Neck Mass Or lumps, Conjunctiva normal Cardiovascular: S1 and S2 Present, aortic systolic Murmur, Respiratory: good respiratory effort, Bilateral Air entry present and CTA, no Crackles, no wheezes Abdomen: Bowel Sound present  Extremities: no Pedal edema Neurology: alert and oriented to time, place, and person Gait not checked due to patient safety concerns   Condition at discharge: good  The results of significant diagnostics from this hospitalization (including imaging, microbiology, ancillary and laboratory) are listed below for reference.   Imaging Studies: CT ABDOMEN PELVIS WO CONTRAST  Result Date: 03/14/2021 CLINICAL DATA:  Lower abdominal pain EXAM: CT ABDOMEN AND PELVIS WITHOUT CONTRAST TECHNIQUE: Multidetector CT imaging of the abdomen and pelvis was performed following the standard protocol without IV contrast. RADIATION DOSE REDUCTION: This exam was performed according to the departmental dose-optimization program which includes automated exposure control, adjustment of the mA and/or kV according to patient size and/or use of iterative reconstruction technique. COMPARISON:  11/08/2020 FINDINGS: Lower chest: No acute abnormality. Hepatobiliary: No focal hepatic abnormality. Gallbladder unremarkable. Pancreas: No focal abnormality or ductal dilatation.  Spleen: No focal abnormality.  Normal size. Adrenals/Urinary Tract: Prior cystectomy with right lower quadrant urostomy. Ureteral stents extend through the urostomy and into the renal pelvis bilaterally. Left renal atrophy. No hydronephrosis. Adrenal glands unremarkable. Stomach/Bowel: No bowel obstruction. Stomach, large and small bowel grossly unremarkable. Moderate stool burden throughout the colon. Vascular/Lymphatic: Aortic atherosclerosis. No evidence of aneurysm or adenopathy. Reproductive: Prior hysterectomy.  No adnexal masses. Other: No free fluid or free air. Musculoskeletal: No acute bony abnormality. IMPRESSION: Changes of cystectomy with right lower quadrant urostomy. Ureteral stents remain in place. No hydronephrosis. Moderate stool burden in the colon. No acute findings. Aortic atherosclerosis. Electronically Signed   By: Rolm Baptise M.D.   On: 03/14/2021 01:31   MR PELVIS WO CONTRAST  Result Date: 03/16/2021 CLINICAL DATA:  Pelvic pain.  Postmenopausal.  Pubic pain. EXAM: MRI PELVIS WITHOUT CONTRAST TECHNIQUE: Multiplanar multisequence MR imaging of the pelvis was performed. No intravenous contrast was administered. COMPARISON:  CT scan 03/14/2021 FINDINGS: Urinary Tract:  Cystectomy with right lower quadrant urostomy. Bowel: Diffusely distended bowel noted in the pelvis with prominent stool burden in the distal colon and rectum. Vascular/Lymphatic: Unremarkable on noncontrast imaging. No evidence for pelvic sidewall lymphadenopathy. Reproductive: Nonvisualization of the uterus suggest prior hysterectomy. No discernible adnexal mass. Other:  No substantial intraperitoneal free fluid. Musculoskeletal: Diffuse body wall edema noted in the pelvis. Fixation hardware noted left femoral neck. Abnormal T2 signal is identified in both pubic bones extending into the anterior inferior pubic rami bilaterally. Despite this, there does not appear to be a substantial amount of fluid in the joint of the  symphysis pubis and cortical bone is preserved along the symphysis. This finding is associated with fairly marked abnormal T2 signal in the adductor muscles bilaterally, right greater than left suggesting edema/injury. And irregular focus of abnormal signal is identified in the right femoral neck without a linear component to suggest fracture. Prominent edema is noted in the visualized thighs bilaterally, right greater than left. IMPRESSION: 1. Abnormal T2 signal in the pubic bones extending into the anterior inferior pubic rami bilaterally. This is associated with substantial abnormal T2 signal in the adductor muscles bilaterally, right greater than left. Findings could be posttraumatic or related to chronic stress injury in the region of the pubic symphysis although in the absence of recent trauma, the diffuse abnormal signal in the adductor muscles in bony anatomy raises the question of infection/osteomyelitis. Dedicated MSK protocol pelvic MRI with and without contrast might prove helpful to further evaluate. Use of intravenous contrast should be strongly considered to further evaluate for potential soft tissue abscess. 2. Small focus of abnormal signal in the right femoral neck without a linear component to suggest fracture. Osteomyelitis not excluded. Imaging features not highly suspicious for metastatic lesion. 3. Prominent stool burden in the distal colon and rectum. Correlation for clinical constipation recommended. 4. Marked edema in the visualized portion of the thighs bilaterally, right greater than left with diffuse body wall edema in the pelvis. 5. Status post hysterectomy with right lower quadrant urostomy. Electronically Signed   By: Misty Stanley M.D.   On: 03/16/2021 09:02   MR PELVIS W WO CONTRAST  Result Date: 03/17/2021 CLINICAL DATA:  Soft tissue infection of the pelvis. EXAM: MRI PELVIS WITHOUT AND WITH CONTRAST TECHNIQUE: Multiplanar multisequence MR imaging of the pelvis was performed  both before and after administration of intravenous contrast. CONTRAST:  62m GADAVIST GADOBUTROL 1 MMOL/ML IV SOLN COMPARISON:  CT abdomen 03/14/2021 FINDINGS: Bones: Bone marrow edema in the right greater trochanter with a subtle linear component concerning for a non displaced isolated right greater trochanteric fracture. No other hip fracture, dislocation or avascular necrosis. Prior ORIF of the left hip with 3 cannulated screws. Severe bone marrow edema in the pubic bone bilaterally concerning for osteomyelitis. No symphyseal effusion. No periosteal reaction or bone destruction. No aggressive osseous lesion. Mild osteoarthritis of bilateral SI joints. Degenerative disease with disc height loss at L5-S1. broad-based disc bulges at L4-5 and L5-S1. Articular cartilage and labrum Articular cartilage: Partial-thickness cartilage loss of the femoral head and acetabulum bilaterally. Labrum: Grossly intact, but evaluation is limited by lack of intraarticular fluid. Joint or bursal effusion Joint effusion:  No hip joint effusion.  No SI joint effusion. Bursae:  No bursa formation. Muscles and tendons Flexors: Normal. Extensors: Normal. Abductors: Normal. Adductors: Soft tissue edema and enhancement throughout the adductor musculature bilaterally concerning for myositis secondary to an infectious etiology. Gluteals: Normal. Hamstrings: Normal. Other findings No pelvic free fluid.  No fluid collection or hematoma. No inguinal lymphadenopathy. No inguinal hernia. Mild anasarca. IMPRESSION: 1. Bone marrow edema in the right greater trochanter with a subtle linear component concerning for a non displaced isolated right greater trochanteric fracture. 2. Severe bone marrow edema in the pubic bone bilaterally concerning for osteomyelitis. 3. Soft tissue edema and enhancement throughout the adductor musculature bilaterally concerning for myositis secondary to an infectious etiology. Electronically Signed   By: Kathreen Devoid M.D.    On: 03/17/2021 08:11   CT BIOPSY  Result Date: 03/19/2021 INDICATION: 66 year old female with 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 EXAM: CT BIOPSY COMPARISON:  03/16/2021 MEDICATIONS: None. ANESTHESIA/SEDATION: Fentanyl 100 mcg IV; Versed 2 mg IV Sedation time: 10 minutes; The patient was continuously monitored during the procedure by the interventional radiology nurse under my direct supervision. CONTRAST:  None. COMPLICATIONS: None immediate. PROCEDURE: Informed consent was obtained from the patient following an explanation of the procedure, risks, benefits and alternatives. A time out was performed prior to the initiation of the procedure. RADIATION DOSE REDUCTION: This exam was performed according to the departmental dose-optimization program which includes automated exposure control, adjustment of the mA and/or kV according to patient size and/or use of iterative reconstruction technique. The patient was positioned supine on the CT table and a limited CT was performed for procedural planning to target the left pubic bone. The procedure was planned. The operative site was prepped and draped in the usual sterile fashion. Appropriate trajectory was confirmed with a 22 gauge spinal needle after the adjacent tissues were anesthetized with 1% Lidocaine with epinephrine. Under intermittent CT guidance, a 17 gauge coaxial needle was advanced into the peripheral aspect of the mass. Appropriate positioning was confirmed and a total of 2 samples were obtained with an 11 gauge core needle biopsy device. The co-axial needle was removed and hemostasis was achieved with manual compression. A limited postprocedural CT was negative for hemorrhage or additional complication. A dressing was placed. The patient tolerated the procedure well without immediate postprocedural complication. IMPRESSION: Technically successful CT guided core needle biopsy of left pubic bone. Ruthann Cancer, MD [facility] Electronically Signed   By: Ruthann Cancer M.D.   On: 03/19/2021 17:04   ECHOCARDIOGRAM COMPLETE  Result Date: 03/18/2021    ECHOCARDIOGRAM REPORT   Patient Name:   Shelia Chan Date of Exam: 03/18/2021 Medical Rec #:  [id number]      Height:       61.0 in Accession #:    [id number]     Weight:       127.4 lb Date of Birth:  [birth year]       BSA:          1.559 m Patient Age:    79 years       BP:           103/71 mmHg Patient Gender: F              HR:           59 bpm. Exam Location:  Inpatient Procedure: 2D Echo, 3D Echo, Cardiac Doppler and Color Doppler Indications:    R01.1 Murmur. Osteomyelitis  History:        Patient has prior history of Echocardiogram examinations, most                 recent 06/21/2019. CHF, COPD, Signs/Symptoms:Murmur; Risk  Factors:Current Smoker. Cancer.  Sonographer:    Roseanna Rainbow RDCS Referring Phys: [phone] RALPH A NETTEY  Sonographer Comments: Suboptimal parasternal window. IMPRESSIONS  1. Left ventricular ejection fraction, by estimation, is 65 to 70%. Left ventricular ejection fraction by 2D MOD biplane is 68.2 %. The left ventricle has normal function. The left ventricle has no regional wall motion abnormalities. There is mild left ventricular hypertrophy. Left ventricular diastolic parameters are consistent with Grade I diastolic dysfunction (impaired relaxation).  2. Right ventricular systolic function is normal. The right ventricular size is normal. There is normal pulmonary artery systolic pressure. The estimated right ventricular systolic pressure is 94.8 mmHg.  3. The mitral valve is grossly normal. Trivial mitral valve regurgitation.  4. The aortic valve is abnormal. There is moderate thickening of the aortic valve. Aortic valve regurgitation is mild to moderate.  5. The inferior vena cava is dilated in size with <50% respiratory variability, suggesting right atrial pressure of 15 mmHg. Comparison(s): Changes from prior  study are noted. 06/21/2019: LVEF 60-65%, mild AI. Conclusion(s)/Recommendation(s): No evidence of valvular vegetations on this transthoracic echocardiogram. Consider a transesophageal echocardiogram to exclude infective endocarditis if clinically indicated. FINDINGS  Left Ventricle: Left ventricular ejection fraction, by estimation, is 65 to 70%. Left ventricular ejection fraction by 2D MOD biplane is 68.2 %. The left ventricle has normal function. The left ventricle has no regional wall motion abnormalities. The left ventricular internal cavity size was normal in size. There is mild left ventricular hypertrophy. Left ventricular diastolic parameters are consistent with Grade I diastolic dysfunction (impaired relaxation). Normal left ventricular filling pressure. Right Ventricle: The right ventricular size is normal. No increase in right ventricular wall thickness. Right ventricular systolic function is normal. There is normal pulmonary artery systolic pressure. The tricuspid regurgitant velocity is 2.19 m/s, and  with an assumed right atrial pressure of 15 mmHg, the estimated right ventricular systolic pressure is 54.6 mmHg. Left Atrium: Left atrial size was normal in size. Right Atrium: Right atrial size was normal in size. Pericardium: There is no evidence of pericardial effusion. Mitral Valve: The mitral valve is grossly normal. Trivial mitral valve regurgitation. Tricuspid Valve: The tricuspid valve is grossly normal. Tricuspid valve regurgitation is mild. Aortic Valve: The aortic valve is abnormal. There is moderate thickening of the aortic valve. Aortic valve regurgitation is mild to moderate. Aortic regurgitation PHT measures 406 msec. Aortic valve mean gradient measures 8.0 mmHg. Aortic valve peak gradient measures 14.7 mmHg. Aortic valve area, by VTI measures 2.09 cm. Pulmonic Valve: The pulmonic valve was normal in structure. Pulmonic valve regurgitation is not visualized. Aorta: The aortic root and  ascending aorta are structurally normal, with no evidence of dilitation. Venous: The inferior vena cava is dilated in size with less than 50% respiratory variability, suggesting right atrial pressure of 15 mmHg. IAS/Shunts: No atrial level shunt detected by color flow Doppler.  LEFT VENTRICLE PLAX 2D                        Biplane EF (MOD) LVIDd:         3.30 cm         LV Biplane EF:   Left LVIDs:         2.30 cm                          ventricular LV PW:         1.30 cm  ejection LV IVS:        1.20 cm                          fraction by LVOT diam:     1.90 cm                          2D MOD LV SV:         86                               biplane is LV SV Index:   55                               68.2 %. LVOT Area:     2.84 cm                                Diastology                                LV e' medial:    9.25 cm/s LV Volumes (MOD)               LV E/e' medial:  10.1 LV vol d, MOD    65.7 ml       LV e' lateral:   16.60 cm/s A2C:                           LV E/e' lateral: 5.6 LV vol d, MOD    94.2 ml A4C: LV vol s, MOD    21.3 ml A2C: LV vol s, MOD    29.3 ml A4C: LV SV MOD A2C:   44.4 ml LV SV MOD A4C:   94.2 ml LV SV MOD BP:    55.8 ml RIGHT VENTRICLE             IVC RV S prime:     14.00 cm/s  IVC diam: 2.10 cm TAPSE (M-mode): 2.6 cm LEFT ATRIUM             Index        RIGHT ATRIUM           Index LA diam:        2.70 cm 1.73 cm/m   RA Area:     12.30 cm LA Vol (A2C):   24.9 ml 15.97 ml/m  RA Volume:   26.70 ml  17.12 ml/m LA Vol (A4C):   47.9 ml 30.72 ml/m LA Biplane Vol: 35.3 ml 22.64 ml/m  AORTIC VALVE AV Area (Vmax):    2.39 cm AV Area (Vmean):   2.12 cm AV Area (VTI):     2.09 cm AV Vmax:           192.00 cm/s AV Vmean:          130.000 cm/s AV VTI:            0.412 m AV Peak Grad:      14.7 mmHg AV Mean Grad:      8.0 mmHg LVOT Vmax:         161.67 cm/s LVOT Vmean:  97.050 cm/s LVOT VTI:          0.304 m LVOT/AV VTI ratio: 0.74 AI PHT:            406  msec  AORTA Ao Root diam: 3.30 cm Ao Asc diam:  3.10 cm Ao Desc diam: 2.70 cm MITRAL VALVE               TRICUSPID VALVE MV Area (PHT): 3.72 cm    TR Peak grad:   19.2 mmHg MV Decel Time: 204 msec    TR Vmax:        219.00 cm/s MV E velocity: 93.00 cm/s MV A velocity: 85.70 cm/s  SHUNTS MV E/A ratio:  1.09        Systemic VTI:  0.30 m                            Systemic Diam: 1.90 cm Lyman Bishop MD Electronically signed by Lyman Bishop MD Signature Date/Time: 03/18/2021/3:30:56 PM    Final    IR EXT NEPHROURETERAL CATH EXCHANGE  Result Date: 03/19/2021 INDICATION: 66 year old female with chronic indwelling bilateral nephroureteral catheters via ileostomy presenting for routine check and exchange. EXAM: FLUOROSCOPIC GUIDED bilateral NEPHROURETERAL CATHETER EXCHANGE COMPARISON:  02/09/2021 CONTRAST:  Fifteen mL Omnipaque administered into the collecting system FLUOROSCOPY TIME:  30 seconds, 6 mGy COMPLICATIONS: None immediate. TECHNIQUE: Informed written consent was obtained from the patient after a discussion of the risks, benefits and alternatives to treatment. Questions regarding the procedure were encouraged and answered. A timeout was performed prior to the initiation of the procedure. External portion of the existing retrograde nephroureteral cathetesr as well as the surrounding skin was draped in the usual sterile fashion. A sterile drape was applied covering the operative field. Maximum barrier sterile technique with sterile gowns and gloves were used for the procedure. A timeout was performed prior to the initiation of the procedure. A pre procedural spot fluoroscopic image was obtained after contrast was injected via the existing right retrograde nephroureteral catheter demonstrating appropriate positioning within the renal pelvis. The existing catheter was cut and cannulated with an Amplatz wire which was coiled within the renal pelvis. Under intermittent fluoroscopic guidance, the existing nephrostomy  catheter was exchanged for a new 10 French, 45 cm all-purpose drainage catheter. Contrast injection confirmed appropriate positioning within the renal pelvis and a post exchange fluoroscopic image was obtained. Identical steps were performed for the existing left retrograde nephroureteral catheter. The catheters were locked and the patient's urostomy was reapplied. The patient tolerated the procedure well without immediate postprocedural complication. FINDINGS: The existing nephroureteral catheters are appropriately positioned and functioning. After successful fluoroscopic guided exchange, the new nephrostomy catheters are coiled and locked within the respective renal pelves. IMPRESSION: Successful fluoroscopic guided exchange of bilateral 10 French, 45 cm retrograde nephroureteral catheters. Ruthann Cancer, MD [facility] Radiology Electronically Signed   By: Ruthann Cancer M.D.   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 EXAM: FLUOROSCOPIC GUIDED bilateral NEPHROURETERAL CATHETER EXCHANGE COMPARISON:  02/09/2021 CONTRAST:  Fifteen mL Omnipaque administered into the collecting system FLUOROSCOPY TIME:  30 seconds, 6 mGy COMPLICATIONS: None immediate. TECHNIQUE: Informed written consent was obtained from the patient after a discussion of the risks, benefits and alternatives to treatment. Questions regarding the procedure were encouraged and answered.  A timeout was performed prior to the initiation of the procedure. External portion of the existing retrograde nephroureteral cathetesr as well as the surrounding skin was draped in the usual sterile fashion. A sterile drape was applied covering the operative field. Maximum barrier sterile technique with sterile gowns and gloves were  used for the procedure. A timeout was performed prior to the initiation of the procedure. A pre procedural spot fluoroscopic image was obtained after contrast was injected via the existing right retrograde nephroureteral catheter demonstrating appropriate positioning within the renal pelvis. The existing catheter was cut and cannulated with an Amplatz wire which was coiled within the renal pelvis. Under intermittent fluoroscopic guidance, the existing nephrostomy catheter was exchanged for a new 10 French, 45 cm all-purpose drainage catheter. Contrast injection confirmed appropriate positioning within the renal pelvis and a post exchange fluoroscopic image was obtained. Identical steps were performed for the existing left retrograde nephroureteral catheter. The catheters were locked and the patient's urostomy was reapplied. The patient tolerated the procedure well without immediate postprocedural complication. FINDINGS: The existing nephroureteral catheters are appropriately positioned and functioning. After successful fluoroscopic guided exchange, the new nephrostomy catheters are coiled and locked within the respective renal pelves. IMPRESSION: Successful fluoroscopic guided exchange of bilateral 10 French, 45 cm retrograde nephroureteral catheters. Ruthann Cancer, MD [facility] Radiology Electronically Signed   By: Ruthann Cancer M.D.   On: 03/19/2021 17:09   IR TUNNELED CENTRAL VENOUS CATHETER PLACEMENT  Result Date: 03/25/2021 CLINICAL DATA:  Osteomyelitis, needs durable venous access for treatment regimen EXAM: TUNNELED CENTRAL VENOUS CATHETER PLACEMENT WITH ULTRASOUND AND FLUOROSCOPIC GUIDANCE TECHNIQUE: The procedure, risks, benefits, and alternatives were explained to the patient. Questions regarding the procedure were encouraged and answered. The patient understands and consents to the procedure. Patency of the right EJ vein was confirmed with ultrasound  with image documentation. An appropriate skin site was determined. Region was prepped using maximum barrier technique including cap and mask, sterile gown, sterile gloves, large sterile sheet, and Chlorhexidine as cutaneous antisepsis. The region was infiltrated locally with 1% lidocaine. Under real-time ultrasound guidance, the right EJ vein was accessed with a 21 gauge micropuncture needle; the needle tip within the vein was confirmed with ultrasound image documentation. 12F dual-lumen cuffed PowerLine tunneled from a right anterior chest wall approach to the dermatotomy site. Needle exchanged over the 018 guidewire for transitional dilator, through which the catheter which had been cut to 25 cm was advanced under intermittent fluoroscopy, positioned with its tip at the cavoatrial junction. Spot chest radiograph confirms good catheter position. No pneumothorax. Catheter was flushed per protocol. Catheter secured externally with O Prolene suture. The right EJ dermatotomy site was closed with Dermabond. COMPLICATIONS: COMPLICATIONS None immediate FLUOROSCOPY TIME:  Radiation Exposure Index (as provided by the fluoroscopic device): 0.01 mGy air Kerma COMPARISON:  None IMPRESSION: 1. Technically successful placement of tunneled right EJ tunneled dual-lumen power injectable catheter with ultrasound and fluoroscopic guidance. Ready for routine use. Electronically Signed   By: Lucrezia Europe M.D.   On: 03/25/2021 11:48    Microbiology: Results for orders placed or performed during the hospital encounter of 03/13/21  Blood culture (routine x 2)     Status: None   Collection Time: 03/13/21 11:23 PM   Specimen: BLOOD  Result Value Ref Range Status   Specimen Description   Final    BLOOD LEFT ANTECUBITAL Performed at [facility] [address], [place],  [address]    Special  Requests   Final    BOTTLES DRAWN AEROBIC ONLY Blood Culture adequate volume Performed at [facility] [address], [place], [place] [address]    Culture   Final    NO GROWTH 5 DAYS Performed at [facility], [place] [address] [place], [place] [address]    Report Status 03/19/2021 FINAL  Final  Blood culture (routine x 2)     Status: None   Collection Time: 03/13/21 11:46 PM   Specimen: BLOOD  Result Value Ref Range Status   Specimen Description   Final    BLOOD BLOOD RIGHT FOREARM Performed at [facility] [address] [place], [place] [address]    Special Requests   Final    BOTTLES DRAWN AEROBIC AND ANAEROBIC Blood Culture adequate volume Performed at [facility] [address] [place], [place] [address]    Culture   Final    NO GROWTH 5 DAYS Performed at [facility], [place] [address], [place], [place] [address]    Report Status 03/19/2021 FINAL  Final  Urine Culture     Status: Abnormal   Collection Time: 03/14/21  2:09 AM   Specimen: Urine, Catheterized  Result Value Ref Range Status   Specimen Description   Final    URINE, CATHETERIZED Performed at [facility] [address] [place], [place] [address]    Special Requests   Final    NONE Performed at [facility], [place] [address], [place], [place] [address]    Culture (A)  Final    >=100,000 COLONIES/mL ESCHERICHIA COLI 60,000 COLONIES/mL PSEUDOMONAS AERUGINOSA Confirmed Extended Spectrum Beta-Lactamase Producer (ESBL).  In bloodstream infections from ESBL organisms, carbapenems are preferred over piperacillin/tazobactam. They are shown to have a lower risk of mortality.    Report Status 03/16/2021 FINAL  Final   Organism ID, Bacteria ESCHERICHIA COLI (A)  Final   Organism ID, Bacteria PSEUDOMONAS AERUGINOSA (A)  Final      Susceptibility   Escherichia coli - MIC*    AMPICILLIN >=32 RESISTANT Resistant     CEFAZOLIN >=64 RESISTANT Resistant     CEFEPIME 16 RESISTANT Resistant     CEFTRIAXONE >=64 RESISTANT  Resistant     CIPROFLOXACIN >=4 RESISTANT Resistant     GENTAMICIN <=1 SENSITIVE Sensitive     IMIPENEM <=0.25 SENSITIVE Sensitive     NITROFURANTOIN <=16 SENSITIVE Sensitive     TRIMETH/SULFA >=320 RESISTANT Resistant     AMPICILLIN/SULBACTAM 8 SENSITIVE Sensitive     PIP/TAZO <=4 SENSITIVE Sensitive     * >=100,000 COLONIES/mL ESCHERICHIA COLI   Pseudomonas aeruginosa - MIC*    CEFTAZIDIME 4 SENSITIVE Sensitive     CIPROFLOXACIN 0.5 SENSITIVE Sensitive     GENTAMICIN <=1 SENSITIVE Sensitive     IMIPENEM 2 SENSITIVE Sensitive     PIP/TAZO <=4 SENSITIVE Sensitive     CEFEPIME 2 SENSITIVE Sensitive     * 60,000 COLONIES/mL PSEUDOMONAS AERUGINOSA  Resp Panel by RT-PCR (Flu A&B, Covid) Nasopharyngeal Swab     Status: None   Collection Time: 03/14/21  2:09 AM   Specimen: Nasopharyngeal Swab; Nasopharyngeal(NP) swabs in vial transport medium  Result Value Ref Range Status   SARS Coronavirus 2 by RT PCR NEGATIVE NEGATIVE Final    Comment: (NOTE) SARS-CoV-2 target nucleic acids are NOT DETECTED.  The SARS-CoV-2 RNA is generally detectable in upper respiratory specimens during the acute phase of infection. The lowest concentration of SARS-CoV-2  viral copies this assay can detect is 138 copies/mL. A negative result does not preclude SARS-Cov-2 infection and should not be used as the sole basis for treatment or other patient management decisions. A negative result may occur with  improper specimen collection/handling, submission of specimen other than nasopharyngeal swab, presence of viral mutation(s) within the areas targeted by this assay, and inadequate number of viral copies(<138 copies/mL). A negative result must be combined with clinical observations, patient history, and epidemiological information. The expected result is Negative.  Fact Sheet for Patients:  EntrepreneurPulse.com.au  Fact Sheet for Healthcare Providers:   IncredibleEmployment.be  This test is no t yet approved or cleared by the [place] FDA and  has been authorized for detection and/or diagnosis of SARS-CoV-2 by FDA under an Emergency Use Authorization (EUA). This EUA will remain  in effect (meaning this test can be used) for the duration of the COVID-19 declaration under Section 564(b)(1) of the Act, 21 U.S.C.section 360bbb-3(b)(1), unless the authorization is terminated  or revoked sooner.       Influenza A by PCR NEGATIVE NEGATIVE Final   Influenza B by PCR NEGATIVE NEGATIVE Final    Comment: (NOTE) The Xpert Xpress SARS-CoV-2/FLU/RSV plus assay is intended as an aid in the diagnosis of influenza from Nasopharyngeal swab specimens and should not be used as a sole basis for treatment. Nasal washings and aspirates are unacceptable for Xpert Xpress SARS-CoV-2/FLU/RSV testing.  Fact Sheet for Patients: EntrepreneurPulse.com.au  Fact Sheet for Healthcare Providers: IncredibleEmployment.be  This test is not yet approved or cleared by the [place] FDA and has been authorized for detection and/or diagnosis of SARS-CoV-2 by FDA under an Emergency Use Authorization (EUA). This EUA will remain in effect (meaning this test can be used) for the duration of the COVID-19 declaration under Section 564(b)(1) of the Act, 21 U.S.C. section 360bbb-3(b)(1), unless the authorization is terminated or revoked.  Performed at [facility], [place] [address] [place], [place] [address]   Wet prep, genital     Status: Abnormal   Collection Time: 03/15/21  1:00 PM   Specimen: Vaginal  Result Value Ref Range Status   Yeast Wet Prep HPF POC NONE SEEN NONE SEEN Final   Trich, Wet Prep NONE SEEN NONE SEEN Final   Clue Cells Wet Prep HPF POC NONE SEEN NONE SEEN Final   WBC, Wet Prep HPF POC >=10 (A) <10 Final   Sperm NONE SEEN  Final    Comment: Performed at [facility], [place] [address], [place], [place] [address]  Aerobic Culture w Gram Stain (superficial specimen)     Status: None   Collection Time: 03/19/21  9:48 AM   Specimen: Biopsy  Result Value Ref Range Status   Specimen Description   Final    BIOPSY PUBIC BONE Performed at [facility] [address] [place], [place] [address]    Special Requests   Final    BIOPSY PUBIC BONE Performed at [facility], [place] [address], [place], [place] [address]    Gram Stain   Final    FEW WBC PRESENT, PREDOMINANTLY MONONUCLEAR NO ORGANISMS SEEN    Culture   Final    NO GROWTH Performed at [facility], [place] [address], [place], [place] [address]    Report Status 03/25/2021 FINAL  Final  Acid Fast Smear (AFB)     Status: None   Collection Time: 03/19/21  9:48 AM  Result Value Ref  Range Status   AFB Specimen Processing Concentration  Final   Acid Fast Smear Negative  Final    Comment: (NOTE) Performed At: [facility] [place], [place] [id number] Rush Farmer MD [id number]    Source (AFB) BIOPSY  Final    Comment: PUBIC BONE Performed at [facility] [address], [place], [place] [address]     Labs: CBC: Recent Labs  Lab 03/20/21 0406 03/23/21 0438 03/24/21 0446  WBC 7.6 6.5 7.1  HGB 9.8* 11.2* 11.1*  HCT 31.4* 33.8* 34.5*  MCV 92.4 87.8 89.1  PLT 541* 676* 854*   Basic Metabolic Panel: Recent Labs  Lab 03/20/21 0406 03/21/21 0421 03/23/21 0438 03/24/21 0446  NA 133*  --  133* 132*  K 3.5  --  3.5 3.6  CL 101  --  96* 91*  CO2 22  --  27 30  GLUCOSE 119*  --  96 108*  BUN 15  --  24* 25*  CREATININE 1.67* 1.75* 1.76* 1.76*  CALCIUM 8.2*  --  8.9 9.2   Liver Function Tests: No results for input(s): AST, ALT, ALKPHOS, BILITOT, PROT, ALBUMIN in the last 168 hours. CBG: No results for input(s): GLUCAP in the last 168 hours.  Discharge time spent: greater than 30  minutes.  Signed: Berle Mull, MD [facility] 03/25/2021

## 2021-03-25 NOTE — Plan of Care (Signed)

## 2021-03-25 NOTE — Care Management Important Message (Signed)
Important Message ? ?Patient Details IM Letter placed in Patients room. ?Name: Shelia Chan ?MRN: [id number] ?Date of Birth: Jun 05, 1955 ? ? ?Medicare Important Message Given:  Yes ? ? ? ? ?Kerin Salen ?03/25/2021, 2:44 PM ?

## 2021-03-25 NOTE — Progress Notes (Signed)
MEDICATION-RELATED CONSULT NOTE  ? ?IR Procedure Consult - Anticoagulant/Antiplatelet PTA/Inpatient Med List Review by Pharmacist  ? ? ?Procedure: R EJ tunnled DL CVC placement 25cm PowerLine ?   ?Completed: 3/ 9/23 11:33 am ? ?Post-Procedural bleeding risk per IR MD assessment:   ? ?Antithrombotic medications on inpatient or PTA profile prior to procedure:    ?  ? ?Recommended restart time per IR Post-Procedure Guidelines:   ?Day 0  ?(at least 4 hours or at next standard dose interval)   ? ? ? ?Other considerations:   none ? ? ?Plan:    resume heparin 5000 units SQ q8h at 2200 ? ?Eudelia Bunch, Pharm.D ?03/25/2021 11:48 AM ? ? ?  ?

## 2021-03-25 NOTE — Procedures (Signed)
?  Procedure: R EJ tunnled DL CVC placement 25cm PowerLine ?EBL:   minimal ?Complications:  none immediate ? ?See full dictation in [facility]. ? ?D. Arne Cleveland MD ?Main # [phone] ?Pager  [phone] ?Mobile [phone] ?  ? ?

## 2021-03-26 ENCOUNTER — Other Ambulatory Visit (HOSPITAL_COMMUNITY): Payer: Self-pay

## 2021-03-27 ENCOUNTER — Other Ambulatory Visit (HOSPITAL_COMMUNITY): Payer: Self-pay

## 2021-03-29 ENCOUNTER — Other Ambulatory Visit (HOSPITAL_COMMUNITY): Payer: Self-pay

## 2021-03-29 ENCOUNTER — Telehealth: Payer: Self-pay

## 2021-03-29 ENCOUNTER — Encounter: Payer: Self-pay | Admitting: Internal Medicine

## 2021-03-29 ENCOUNTER — Other Ambulatory Visit: Payer: Self-pay

## 2021-03-29 ENCOUNTER — Ambulatory Visit (INDEPENDENT_AMBULATORY_CARE_PROVIDER_SITE_OTHER): Payer: Medicare Other | Admitting: Internal Medicine

## 2021-03-29 VITALS — BP 118/75 | HR 70 | Temp 97.3°F | Wt 131.8 lb

## 2021-03-29 DIAGNOSIS — M8618 Other acute osteomyelitis, other site: Secondary | ICD-10-CM | POA: Diagnosis not present

## 2021-03-29 MED ORDER — DOXYCYCLINE HYCLATE 100 MG PO TABS: 100.0000 mg | ORAL_TABLET | Freq: Two times a day (BID) | ORAL | 0 refills | Status: DC

## 2021-03-29 NOTE — Telephone Encounter (Signed)
Received fax from [facility] dates 03/26/21 regarding need for pain medication. Spoke with pt and she relayed that her PCP sent refill request for medication. Nothing needed from [facility] at this time. ?

## 2021-03-29 NOTE — Progress Notes (Signed)
? ?  ?Patient: Shelia Chan  ?DOB: [birth year] ?MRN: [id number] ?PCP: [name], MD  ? ? ?Chief Complaint  ?Patient presents with  ? Hospitalization Follow-up  ?  Pelvic bone infection - pt reports she is feeling tired and having diarrhea multiple times at day - worse at night.   ?  ? ?Patient Active Problem List  ? Diagnosis Date Noted  ? 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 Thrombocytosis 03/23/2021  ? Greater trochanter fracture ([place]) 03/17/2021  ? Vaginal discharge 03/14/2021  ? COPD (chronic obstructive pulmonary disease) ([place])   ? Chronic diastolic CHF (congestive heart failure) ([place])   ? Depression with anxiety   ? UTI (urinary tract infection) due to urinary indwelling catheter ([place]) 11/09/2020  ? Pubic bone Osteomyelitis, bilaterally, 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  ? Hematuria 11/08/2020  ? Asthma, chronic, unspecified asthma severity, with acute exacerbation 11/08/2020  ? Edema 11/08/2020  ? CKD (chronic kidney disease), stage IV ([place]) 04/08/2019  ?  ? 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 Initially admitted to [facility] 2/26-3/9  for sepsis secondary to UTI.  Urine Cx+ PsA and  ESBL Ecoli. She was initially started on vancomycin and meropenem(ESBL Ecoli Hx) then transitioned to meropenem alone. MR showed. MR pelvis  w and w/o showed right greater trochanteric fracture, severe bone marrow edema  in b/l pubic bone concerning for osteomyelitis, concern for myositis throughout adductor musclelature b/l. Pt underwent Bx of pubic bone with IR with cultures and pathology on 3/3 Discharged on meropenem x6 weeks and ID follow-up.She reports Hx treatment of  urethral cancer with chemotherapy and local radiation. ?Interval: Bacterial Cx negative.  ?Today: She reports  diarrhea continues from hospitalization. It has not worsened, she is taking probiotics. She denies fever, chills.  ?Review of Systems  ?All other systems reviewed and are negative. ? ?Past Medical History:  ?Diagnosis Date  ? Arthritis   ? Asthma   ? Blood transfusion without reported diagnosis   ? Cancer [facility])   ? CHF (congestive heart failure) ([place])   ? COPD (chronic obstructive pulmonary disease) ([place])   ? Heart murmur   ? Immune deficiency disorder ([place])   ? Renal disorder   ? ? ?Outpatient Medications Prior to Visit  ?Medication Sig Dispense Refill  ? acetaminophen (TYLENOL) 500 MG tablet Take 500-1,000 mg by mouth every 6 (six) hours as needed for moderate pain or fever.    ? albuterol (VENTOLIN HFA) 108 (90 Base) MCG/ACT inhaler Inhale 1 puff into the lungs every 6 (six) hours as needed for shortness of breath.    ? ARIPiprazole (ABILIFY) 30 MG tablet Take 30 mg by mouth daily.    ? aspirin EC 81 MG tablet Take 81 mg by mouth daily.    ? atorvastatin (LIPITOR) 10 MG tablet Take 10 mg by mouth at bedtime.    ? B Complex-C (B-COMPLEX WITH VITAMIN C) tablet Take 1 tablet by mouth daily.    ? busPIRone (BUSPAR) 7.5 MG tablet Take 7.5 mg by mouth 2 (two) times daily.    ? calcitRIOL (ROCALTROL) 0.25 MCG capsule Take 0.25 mcg by mouth daily.    ? Cholecalciferol (VITAMIN D-3) 25 MCG (1000 UT) CAPS Take 2,000 Units by mouth daily.    ? DULoxetine (CYMBALTA) 30 MG capsule Take 30 mg by mouth at  bedtime.    ? ferrous sulfate 324 MG TBEC Take 324 mg by mouth daily with breakfast.    ? furosemide (LASIX) 40 MG tablet Take 40 mg by mouth daily.    ? HYDROcodone-acetaminophen (NORCO/VICODIN) 5-325 MG tablet Take 1 tablet by mouth every 4 (four) hours as needed for severe pain or moderate pain. 30 tablet 0  ? meropenem (MERREM) IVPB Inject 1 g into the vein every 12 (twelve) hours. Indication:  Pubic osteo ?First Dose: Yes ?Last Day of Therapy:  04/30/21 ?Labs - Once weekly:  CBC/D and BMP, ?Labs - Every other week:  ESR  and CRP ?Method of administration: Mini-Bag Plus / Gravity ?Method of administration may be changed at the discretion of home infusion pharmacist based upon assessment of the patient and/or caregiver's ability to self-administer the medication ordered. 75 Units 0  ? methocarbamol (ROBAXIN) 500 MG tablet Take 500 mg by mouth 3 (three) times daily as needed for muscle spasms.    ? Multiple Vitamin (MULTIVITAMIN ADULT PO) Take 1 tablet by mouth daily.    ? pantoprazole (PROTONIX) 40 MG tablet Take 40 mg by mouth daily.    ? potassium chloride (KLOR-CON) 20 MEQ packet Take by mouth 2 (two) times daily.    ? Probiotic Product (PROBIOTIC PO) Take 1 capsule by mouth daily.    ? saccharomyces boulardii (FLORASTOR) 250 MG capsule Take 1 capsule (250 mg total) by mouth 2 (two) times daily. 30 capsule 0  ? vitamin C (ASCORBIC ACID) 500 MG tablet Take 500 mg by mouth daily.    ? KLOR-CON M20 20 MEQ tablet TAKE 1 TABLET BY MOUTH EVERY DAY (Patient not taking: Reported on 03/29/2021) 90 tablet 3  ? ?No facility-administered medications prior to visit.  ?  ? ?Allergies  ?Allergen Reactions  ? Metoprolol Shortness Of Breath  ? ? ?Social History  ? ?Tobacco Use  ? Smoking status: Some Days  ?  Packs/day: 1.00  ?  Years: 50.00  ?  Pack years: 50.00  ?  Types: Cigarettes, E-cigarettes  ?  Last attempt to quit: 01/21/2019  ?  Years since quitting: 2.1  ? Smokeless tobacco: Never  ? Tobacco comments:  ?  Quit smoking but vapes occasionally  ?Vaping Use  ? Vaping Use: Never used  ?Substance Use Topics  ? Alcohol use: Not Currently  ? Drug use: Yes  ?  Types: Marijuana  ?  Comment: occasional   ? ? ?Family History  ?Problem Relation Age of Onset  ? Heart disease Maternal Grandfather   ? ? ?Objective:  ? ?Vitals:  ? 03/29/21 1451  ?BP: 118/75  ?Pulse: 70  ?Temp: (!) 97.3 ?F (36.3 ?C)  ?TempSrc: Temporal  ?SpO2: 100%  ?Weight: 131 lb 12.8 oz (59.8 kg)  ? ?Body mass index is 24.9 kg/m?. ? ?Physical Exam ?Constitutional:   ?   Appearance: Normal  appearance.  ?HENT:  ?   Head: Normocephalic and atraumatic.  ?   Right Ear: Tympanic membrane normal.  ?   Left Ear: Tympanic membrane normal.  ?   Nose: Nose normal.  ?   Mouth/Throat:  ?   Mouth: Mucous membranes are moist.  ?Eyes:  ?   Extraocular Movements: Extraocular movements intact.  ?   Conjunctiva/sclera: Conjunctivae normal.  ?   Pupils: Pupils are equal, round, and reactive to light.  ?Cardiovascular:  ?   Rate and Rhythm: Normal rate and regular rhythm.  ?   Heart sounds: No murmur heard. ?  No friction rub. No gallop.  ?Pulmonary:  ?   Effort: Pulmonary effort is normal.  ?   Breath sounds: Normal breath sounds.  ?Abdominal:  ?   General: Abdomen is flat.  ?   Palpations: Abdomen is soft.  ?   Comments: Right colostomy bag  ?Musculoskeletal:     ?   General: Normal range of motion.  ?Skin: ?   General: Skin is warm and dry.  ?Neurological:  ?   General: No focal deficit present.  ?   Mental Status: She is alert and oriented to person, place, and time.  ?Psychiatric:     ?   Mood and Affect: Mood normal.  ? ? ?Lab Results: ?Lab Results  ?Component Value Date  ? WBC 7.1 03/24/2021  ? HGB 11.1 (L) 03/24/2021  ? HCT 34.5 (L) 03/24/2021  ? MCV 89.1 03/24/2021  ? PLT 738 (H) 03/24/2021  ?  ?Lab Results  ?Component Value Date  ? CREATININE 1.76 (H) 03/24/2021  ? BUN 25 (H) 03/24/2021  ? NA 132 (L) 03/24/2021  ? K 3.6 03/24/2021  ? CL 91 (L) 03/24/2021  ? CO2 30 03/24/2021  ?  ?Lab Results  ?Component Value Date  ? ALT 11 03/17/2021  ? AST 14 (L) 03/17/2021  ? ALKPHOS 75 03/17/2021  ? BILITOT 0.2 (L) 03/17/2021  ?  ? ?Assessment & Plan:  ? ?#Pubic osteomyelitis and myositis SP Bx of pubic bone on 3/3 ?-Bone Bx showed osteomyelitis ?-Anarobic Cx negative, fungal and AFB negative so far ?-Pt reports diarrhea from hospitalization has persisted. ?Plan:  ?-Follow-up on OPAT labs. ?-Continue meropenem x6 weeks(EOT 04/30/21) ?-Follow-up in 2 weeks.  ?- If Scr trending up and diarrhea worsens will plan to transition  meropenem to avycaz and move up appointment to this week to obtain stool studies ? ?Laurice Record, MD ?[facility] ?[facility] ? ? ?03/29/21  ?3:14 PM  ?

## 2021-03-30 ENCOUNTER — Other Ambulatory Visit (HOSPITAL_COMMUNITY): Payer: Self-pay

## 2021-03-31 ENCOUNTER — Other Ambulatory Visit (HOSPITAL_COMMUNITY): Payer: Self-pay

## 2021-03-31 MED ORDER — HYDROCODONE-ACETAMINOPHEN 5-325 MG PO TABS
ORAL_TABLET | ORAL | 0 refills | Status: DC
  Filled 2021-03-31: qty 30, 10d supply, fill #0

## 2021-04-02 ENCOUNTER — Encounter: Payer: Self-pay | Admitting: Internal Medicine

## 2021-04-02 NOTE — Progress Notes (Signed)
3/13 labs wbc 7.9, Scr 1.66,ESR 56 ?

## 2021-04-07 ENCOUNTER — Telehealth: Payer: Self-pay | Admitting: Cardiovascular Disease

## 2021-04-07 NOTE — Telephone Encounter (Signed)
Received a call from [facility] with [facility].Calling to report patient has gained 14 lbs since 03/26/21.She has 4+ edema in both lower legs.Sob no worse.Stated she is non compliant with her diet.Stated she has educated her on a low salt diet.Advised ok to double lasix dose for the next 3 days only then return to normal dose.Advised to wear compression stockings.Keep feet elevated when sitting.Advised to keep appointment already scheduled with Diona Browner PA 4/10 at 9:30 am.Advised to call sooner if needed.I will send message to Dr.O'Neal for review.    ?

## 2021-04-07 NOTE — Telephone Encounter (Signed)
Returned call to Levada Dy with [facility] left message to call back. ?Called patient left message on personal voice mail to call back. ?

## 2021-04-07 NOTE — Telephone Encounter (Signed)
Levada Dy from [facility], called to report a 14lbs weight gain since 03/26/21.  She states patient has edema on her lower extremities.  She would like compression stockings to be ordered for patient.  ?

## 2021-04-14 ENCOUNTER — Other Ambulatory Visit (HOSPITAL_COMMUNITY): Payer: Self-pay

## 2021-04-15 ENCOUNTER — Other Ambulatory Visit (HOSPITAL_COMMUNITY): Payer: Self-pay

## 2021-04-15 MED ORDER — HYDROCODONE-ACETAMINOPHEN 5-325 MG PO TABS
ORAL_TABLET | ORAL | 0 refills | Status: DC
  Filled 2021-04-15: qty 30, 10d supply, fill #0

## 2021-04-20 ENCOUNTER — Emergency Department (HOSPITAL_COMMUNITY): Payer: Medicare Other

## 2021-04-20 ENCOUNTER — Telehealth: Payer: Self-pay | Admitting: Cardiovascular Disease

## 2021-04-20 ENCOUNTER — Inpatient Hospital Stay (HOSPITAL_COMMUNITY)
Admission: EM | Admit: 2021-04-20 | Discharge: 2021-04-24 | DRG: 540 | Disposition: A | Discharge status: Home / Self Care | Payer: Medicare Other | Attending: Internal Medicine | Admitting: Internal Medicine

## 2021-04-20 ENCOUNTER — Encounter (HOSPITAL_COMMUNITY): Payer: Self-pay

## 2021-04-20 DIAGNOSIS — N1330 Unspecified hydronephrosis: Secondary | ICD-10-CM | POA: Diagnosis present

## 2021-04-20 DIAGNOSIS — Z66 Do not resuscitate: Secondary | ICD-10-CM | POA: Diagnosis present

## 2021-04-20 DIAGNOSIS — Z8249 Family history of ischemic heart disease and other diseases of the circulatory system: Secondary | ICD-10-CM

## 2021-04-20 DIAGNOSIS — Z79899 Other long term (current) drug therapy: Secondary | ICD-10-CM

## 2021-04-20 DIAGNOSIS — M8668 Other chronic osteomyelitis, other site: Secondary | ICD-10-CM | POA: Diagnosis not present

## 2021-04-20 DIAGNOSIS — J449 Chronic obstructive pulmonary disease, unspecified: Secondary | ICD-10-CM | POA: Diagnosis present

## 2021-04-20 DIAGNOSIS — Z20822 Contact with and (suspected) exposure to covid-19: Secondary | ICD-10-CM | POA: Diagnosis present

## 2021-04-20 DIAGNOSIS — F17210 Nicotine dependence, cigarettes, uncomplicated: Secondary | ICD-10-CM | POA: Diagnosis present

## 2021-04-20 DIAGNOSIS — C680 Malignant neoplasm of urethra: Secondary | ICD-10-CM | POA: Diagnosis present

## 2021-04-20 DIAGNOSIS — T361X5A Adverse effect of cephalosporins and other beta-lactam antibiotics, initial encounter: Secondary | ICD-10-CM | POA: Diagnosis present

## 2021-04-20 DIAGNOSIS — K521 Toxic gastroenteritis and colitis: Secondary | ICD-10-CM | POA: Diagnosis present

## 2021-04-20 DIAGNOSIS — E876 Hypokalemia: Secondary | ICD-10-CM | POA: Diagnosis present

## 2021-04-20 DIAGNOSIS — M869 Osteomyelitis, unspecified: Secondary | ICD-10-CM | POA: Diagnosis present

## 2021-04-20 DIAGNOSIS — X58XXXA Exposure to other specified factors, initial encounter: Secondary | ICD-10-CM | POA: Diagnosis present

## 2021-04-20 DIAGNOSIS — N179 Acute kidney failure, unspecified: Secondary | ICD-10-CM | POA: Diagnosis present

## 2021-04-20 DIAGNOSIS — S32591A Other specified fracture of right pubis, initial encounter for closed fracture: Secondary | ICD-10-CM | POA: Diagnosis present

## 2021-04-20 DIAGNOSIS — S32501A Unspecified fracture of right pubis, initial encounter for closed fracture: Secondary | ICD-10-CM

## 2021-04-20 DIAGNOSIS — N939 Abnormal uterine and vaginal bleeding, unspecified: Secondary | ICD-10-CM | POA: Diagnosis present

## 2021-04-20 DIAGNOSIS — Z7982 Long term (current) use of aspirin: Secondary | ICD-10-CM

## 2021-04-20 DIAGNOSIS — N184 Chronic kidney disease, stage 4 (severe): Secondary | ICD-10-CM | POA: Diagnosis present

## 2021-04-20 DIAGNOSIS — M009 Pyogenic arthritis, unspecified: Secondary | ICD-10-CM | POA: Diagnosis present

## 2021-04-20 DIAGNOSIS — I5032 Chronic diastolic (congestive) heart failure: Secondary | ICD-10-CM | POA: Diagnosis present

## 2021-04-20 DIAGNOSIS — N898 Other specified noninflammatory disorders of vagina: Secondary | ICD-10-CM | POA: Diagnosis present

## 2021-04-20 DIAGNOSIS — M60851 Other myositis, right thigh: Secondary | ICD-10-CM | POA: Diagnosis present

## 2021-04-20 DIAGNOSIS — S32509A Unspecified fracture of unspecified pubis, initial encounter for closed fracture: Secondary | ICD-10-CM | POA: Diagnosis present

## 2021-04-20 DIAGNOSIS — Z8559 Personal history of malignant neoplasm of other urinary tract organ: Secondary | ICD-10-CM

## 2021-04-20 DIAGNOSIS — Z90710 Acquired absence of both cervix and uterus: Secondary | ICD-10-CM

## 2021-04-20 DIAGNOSIS — M60852 Other myositis, left thigh: Secondary | ICD-10-CM | POA: Diagnosis present

## 2021-04-20 DIAGNOSIS — R600 Localized edema: Secondary | ICD-10-CM

## 2021-04-20 LAB — COMPREHENSIVE METABOLIC PANEL
ALT: 14 U/L (ref 0–44)
AST: 18 U/L (ref 15–41)
Albumin: 2.8 g/dL — ABNORMAL LOW (ref 3.5–5.0)
Alkaline Phosphatase: 156 U/L — ABNORMAL HIGH (ref 38–126)
Anion gap: 7 (ref 5–15)
BUN: 41 mg/dL — ABNORMAL HIGH (ref 8–23)
CO2: 16 mmol/L — ABNORMAL LOW (ref 22–32)
Calcium: 7.6 mg/dL — ABNORMAL LOW (ref 8.9–10.3)
Chloride: 113 mmol/L — ABNORMAL HIGH (ref 98–111)
Creatinine, Ser: 1.91 mg/dL — ABNORMAL HIGH (ref 0.44–1.00)
GFR, Estimated: 28 mL/min — ABNORMAL LOW (ref >60)
Glucose, Bld: 106 mg/dL — ABNORMAL HIGH (ref 70–99)
Potassium: 2.8 mmol/L — ABNORMAL LOW (ref 3.5–5.1)
Sodium: 136 mmol/L (ref 135–145)
Total Bilirubin: 0.1 mg/dL — ABNORMAL LOW (ref 0.3–1.2)
Total Protein: 7.2 g/dL (ref 6.5–8.1)

## 2021-04-20 LAB — FUNGUS CULTURE WITH STAIN

## 2021-04-20 LAB — CBC WITH DIFFERENTIAL/PLATELET
Abs Immature Granulocytes: 0.03 10*3/uL (ref 0.00–0.07)
Basophils Absolute: 0.1 10*3/uL (ref 0.0–0.1)
Basophils Relative: 1 %
Eosinophils Absolute: 0.1 10*3/uL (ref 0.0–0.5)
Eosinophils Relative: 1 %
HCT: 28.2 % — ABNORMAL LOW (ref 36.0–46.0)
Hemoglobin: 9.6 g/dL — ABNORMAL LOW (ref 12.0–15.0)
Immature Granulocytes: 0 %
Lymphocytes Relative: 23 %
Lymphs Abs: 1.7 10*3/uL (ref 0.7–4.0)
MCH: 28.2 pg (ref 26.0–34.0)
MCHC: 34 g/dL (ref 30.0–36.0)
MCV: 82.9 fL (ref 80.0–100.0)
Monocytes Absolute: 0.8 10*3/uL (ref 0.1–1.0)
Monocytes Relative: 11 %
Neutro Abs: 4.9 10*3/uL (ref 1.7–7.7)
Neutrophils Relative %: 64 %
Platelets: 589 10*3/uL — ABNORMAL HIGH (ref 150–400)
RBC: 3.4 MIL/uL — ABNORMAL LOW (ref 3.87–5.11)
RDW: 14.9 % (ref 11.5–15.5)
WBC: 7.6 10*3/uL (ref 4.0–10.5)
nRBC: 0 % (ref 0.0–0.2)

## 2021-04-20 LAB — PROTIME-INR
INR: 1.2 (ref 0.8–1.2)
Prothrombin Time: 14.6 seconds (ref 11.4–15.2)

## 2021-04-20 LAB — FUNGAL ORGANISM REFLEX

## 2021-04-20 LAB — FUNGUS CULTURE RESULT

## 2021-04-20 NOTE — ED Notes (Signed)
Pt wants port accessed for labs ?

## 2021-04-20 NOTE — ED Provider Notes (Signed)
?[facility] DEPT ?Provider Note ? ? ?CSN: [id number] ?Arrival date & time: 04/20/21  1945 ? ?  ? ?History ? ?Chief Complaint  ?Patient presents with  ? Vaginal Bleeding  ? ? ?Shelia Chan is a 66 y.o. 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  Patient describes vaginal bleeding as pink spotting, no heavy bleeding like a menstrual cycle, she notes blood in her depends as well as blood on the toilet tissue with wiping.  No alleviating or aggravating factors.  She is currently receiving twice daily IV meropenem at home due to recent osteomyelitis admission, she states she continues to give herself his IV antibiotics which is led to some diarrhea.  She was having suprapubic pain during her hospital admission, however about a week after being discharged she developed a new pain to the right lower abdomen/hip area which is constant.  She reports that she has felt fatigued.  She denies dizziness, lightheadedness, syncope, fever, chills, vomiting, or hematuria. She is not currently receiving chemo/radiation.  She has not had any falls or injuries since her most recent hospitalization. Also having worsening LE swelling despite outpatient provider increasing her diuretic.  ? ? ? ?HPI ? ?  ? ?Home Medications ?Prior to Admission medications   ?Medication Sig Start Date End Date Taking? Authorizing Provider  ?acetaminophen (TYLENOL) 500 MG tablet Take 500-1,000 mg by mouth every 6 (six) hours as needed for moderate pain or fever.    [provider]  ?albuterol (VENTOLIN HFA) 108 (90 Base) MCG/ACT inhaler Inhale 1 puff into the lungs every 6 (six) hours as needed for shortness of breath. 05/22/20   [provider]   ?ARIPiprazole (ABILIFY) 30 MG tablet Take 30 mg by mouth daily. 03/19/19   [provider]  ?aspirin EC 81 MG tablet Take 81 mg by mouth daily.    [provider]  ?atorvastatin (LIPITOR) 10 MG tablet Take 10 mg by mouth at bedtime. 02/07/21   [provider]  ?B Complex-C (B-COMPLEX WITH VITAMIN C) tablet Take 1 tablet by mouth daily.    [provider]  ?busPIRone (BUSPAR) 7.5 MG tablet Take 7.5 mg by mouth 2 (two) times daily. 02/22/21   [provider]  ?calcitRIOL (ROCALTROL) 0.25 MCG capsule Take 0.25 mcg by mouth daily. 02/12/21   [provider]  ?Cholecalciferol (VITAMIN D-3) 25 MCG (1000 UT) CAPS Take 2,000 Units by mouth daily.    [provider]  ?DULoxetine (CYMBALTA) 30 MG capsule Take 30 mg by mouth at bedtime. 06/21/20   [provider]  ?ferrous sulfate 324 MG TBEC Take 324 mg by mouth daily with breakfast.    [provider]  ?furosemide (LASIX) 40 MG tablet Take 40 mg by mouth daily.    [provider]  ?HYDROcodone-acetaminophen (NORCO/VICODIN) 5-325 MG tablet Take 1 tablet by mouth every 4 (four) hours as needed for severe pain or moderate pain. 03/25/21   Lavina Hamman, MD  ?HYDROcodone-acetaminophen (NORCO/VICODIN) 5-325 MG tablet Take 1 tablet by mouth every 8 hours as needed for pain 04/15/21     ?KLOR-CON M20 20 MEQ tablet TAKE 1 TABLET BY MOUTH EVERY DAY ?Patient not taking: Reported on 03/29/2021 10/28/20   Geralynn Rile, MD  ?meropenem (  MERREM) IVPB Inject 1 g into the vein every 12 (twelve) hours. Indication:  Pubic osteo ?First Dose: Yes ?Last Day of Therapy:  04/30/21 ?Labs - Once weekly:  CBC/D and BMP, ?Labs - Every other week:  ESR and CRP ?Method of administration: Mini-Bag Plus / Gravity ?Method of administration may be changed at the discretion of home infusion pharmacist based upon assessment of the patient and/or caregiver's ability to self-administer the medication ordered. 03/25/21 05/01/21   Lavina Hamman, MD  ?methocarbamol (ROBAXIN) 500 MG tablet Take 500 mg by mouth 3 (three) times daily as needed for muscle spasms. 03/08/21   [provider]  ?Multiple Vitamin (MULTIVITAMIN ADULT PO) Take 1 tablet by mouth daily.    [provider]  ?pantoprazole (PROTONIX) 40 MG tablet Take 40 mg by mouth daily. 01/28/21   [provider]  ?potassium chloride (KLOR-CON) 20 MEQ packet Take by mouth 2 (two) times daily.    [provider]  ?Probiotic Product (PROBIOTIC PO) Take 1 capsule by mouth daily.    [provider]  ?saccharomyces boulardii (FLORASTOR) 250 MG capsule Take 1 capsule (250 mg total) by mouth 2 (two) times daily. 03/25/21   Lavina Hamman, MD  ?vitamin C (ASCORBIC ACID) 500 MG tablet Take 500 mg by mouth daily.    [provider]  ?   ? ?Allergies    ?Metoprolol   ? ?Review of Systems   ?Review of Systems  ?Constitutional:  Positive for fatigue. Negative for chills and fever.  ?Respiratory:  Negative for cough and shortness of breath.   ?Cardiovascular:  Positive for leg swelling. Negative for chest pain.  ?Gastrointestinal:  Positive for abdominal pain and diarrhea. Negative for blood in stool and vomiting.  ?Genitourinary:  Positive for vaginal bleeding. Negative for hematuria.  ?Neurological:  Negative for dizziness, syncope and light-headedness.  ?All other systems reviewed and are negative. ? ?Physical Exam ?Updated Vital Signs ?BP 106/62   Pulse (!) 58   Temp 98.3 ?F (36.8 ?C) (Oral)   Resp 16   SpO2 100%  ?Physical Exam ?Vitals and nursing note reviewed.  ?Constitutional:   ?   General: She is not in acute distress. ?   Appearance: She is well-developed. She is not toxic-appearing.  ?HENT:  ?   Head: Normocephalic and atraumatic.  ?Eyes:  ?   General:     ?   Right eye: No discharge.     ?   Left eye: No discharge.  ?   Conjunctiva/sclera: Conjunctivae normal.  ?Cardiovascular:  ?   Rate and Rhythm: Normal rate and regular rhythm.   ?Pulmonary:  ?   Effort: No respiratory distress.  ?   Breath sounds: Normal breath sounds. No wheezing or rales.  ?Abdominal:  ?   General: There is no distension.  ?   Palpations: Abdomen is soft.  ?   Tenderness: There is abdominal tenderness (Suprapubic and right lower quadrant.).  ?   Comments: Ileal conduit in place with pale yellow urine present in bag, no blood.  ?Genitourinary: ?   Comments: NT present as chaperone.  ?No visible active bleeding.  ?Speculum exam deferred due to patient comfort level & her prior surgical intervention.  ?Musculoskeletal:  ?   Cervical back: Neck supple.  ?   Right lower leg: Edema present.  ?   Left lower leg: Edema present.  ?   Comments: Tenderness to palpation throughout the right hip.  ?Skin: ?   General: Skin is  warm and dry.  ?Neurological:  ?   Mental Status: She is alert.  ?   Comments: Clear speech.   ?Psychiatric:     ?   Behavior: Behavior normal.  ? ? ?ED Results / Procedures / Treatments   ?Labs ?(all labs ordered are listed, but only abnormal results are displayed) ?Labs Reviewed  ?CBC WITH DIFFERENTIAL/PLATELET - Abnormal; Notable for the following components:  ?    Result Value  ? RBC 3.40 (*)   ? Hemoglobin 9.6 (*)   ? HCT 28.2 (*)   ? Platelets 589 (*)   ? All other components within normal limits  ?COMPREHENSIVE METABOLIC PANEL - Abnormal; Notable for the following components:  ? Potassium 2.8 (*)   ? Chloride 113 (*)   ? CO2 16 (*)   ? Glucose, Bld 106 (*)   ? BUN 41 (*)   ? Creatinine, Ser 1.91 (*)   ? Calcium 7.6 (*)   ? Albumin 2.8 (*)   ? Alkaline Phosphatase 156 (*)   ? Total Bilirubin 0.1 (*)   ? GFR, Estimated 28 (*)   ? All other components within normal limits  ?MAGNESIUM - Abnormal; Notable for the following components:  ? Magnesium 1.2 (*)   ? All other components within normal limits  ?PROTIME-INR  ?BRAIN NATRIURETIC PEPTIDE  ?SEDIMENTATION RATE  ?C-REACTIVE PROTEIN  ?CBC  ?BASIC METABOLIC PANEL  ? ? ?EKG ?None ? ?Radiology ?CT Abdomen Pelvis  Wo Contrast ? ?Result Date: 04/20/2021 ?CLINICAL DATA:  Osteomyelitis of pelvis, vaginal bleeding. EXAM: CT ABDOMEN AND PELVIS WITHOUT CONTRAST TECHNIQUE: Multidetector CT imaging of the abdomen and pelvis was performe

## 2021-04-20 NOTE — ED Triage Notes (Signed)
Patient arrived stating she is vaginally "spotting blood" over the last four days. Patient is on a blood thinner.  ?

## 2021-04-20 NOTE — ED Provider Triage Note (Signed)
Emergency Medicine Provider Triage Evaluation Note ? ?Shelia Chan , a 66 y.o. female  was evaluated in triage.  Pt complains of vaginal spotting, reports osteomyelitis in pelvic bone, on PICC abx. Reports vaginal spotting (in the diaper) for the past 4 days. ?Diarrhea from antibiotics.  ?Heparin flush to PICC after infusion, no other thinners. ? ?Review of Systems  ?Positive: Vaginal bleeding ?Negative: Fever, dizziness, weakness, SHOB, CP ? ?Physical Exam  ?BP 109/77 (BP Location: Left Arm)   Pulse 76   Temp 98.3 ?F (36.8 ?C) (Oral)   Resp 16   SpO2 100%  ?Gen:   Awake, no distress   ?Resp:  Normal effort  ?MSK:   Moves extremities without difficulty  ?Other:   ? ?Medical Decision Making  ?Medically screening exam initiated at 7:59 PM.  Appropriate orders placed.  [name] was informed that the remainder of the evaluation will be completed by another provider, this initial triage assessment does not replace that evaluation, and the importance of remaining in the ED until their evaluation is complete. ? ? ?  ?Tacy Learn, PA-C ?04/20/21 1959 ? ?

## 2021-04-20 NOTE — Telephone Encounter (Signed)
Called Glenard Haring to try and get pt scheduled tomorrow. No answer at this time, left message for her to return the call.  ?

## 2021-04-20 NOTE — Telephone Encounter (Signed)
?  Pt c/o swelling: STAT is pt has developed SOB within 24 hours ? ?If swelling, where is the swelling located? Legs and ankles ? ?How much weight have you gained and in what time span? 15 lbs in 1 month  ? ?Have you gained 3 pounds in a day or 5 pounds in a week?  ? ?Do you have a log of your daily weights (if so, list)? 03/26/21 120 lbs, today 135 lbs ? ?Are you currently taking a fluid pill? Yes  ? ?Are you currently SOB? None  ? ?Have you traveled recently? No  ? ?Glenard Haring RN with [facility] said, she visited pt today and noticed the swelling on pt's leg and ankles is not improving even after doubling pt's torsemide for 3 days. She said pt legs and ankle is tight swollen but pt is not complaining for any pain, right not pt is so swollen and not able to wear compression socks. She is asking for recommendation from Dr. Audie Box  ?

## 2021-04-21 ENCOUNTER — Other Ambulatory Visit: Payer: Self-pay

## 2021-04-21 ENCOUNTER — Emergency Department (HOSPITAL_COMMUNITY): Payer: Medicare Other

## 2021-04-21 ENCOUNTER — Inpatient Hospital Stay (HOSPITAL_COMMUNITY): Payer: Medicare Other

## 2021-04-21 DIAGNOSIS — M60851 Other myositis, right thigh: Secondary | ICD-10-CM | POA: Diagnosis present

## 2021-04-21 DIAGNOSIS — Z66 Do not resuscitate: Secondary | ICD-10-CM | POA: Diagnosis present

## 2021-04-21 DIAGNOSIS — M8618 Other acute osteomyelitis, other site: Secondary | ICD-10-CM | POA: Diagnosis not present

## 2021-04-21 DIAGNOSIS — N898 Other specified noninflammatory disorders of vagina: Secondary | ICD-10-CM

## 2021-04-21 DIAGNOSIS — E876 Hypokalemia: Secondary | ICD-10-CM

## 2021-04-21 DIAGNOSIS — N184 Chronic kidney disease, stage 4 (severe): Secondary | ICD-10-CM

## 2021-04-21 DIAGNOSIS — Z90710 Acquired absence of both cervix and uterus: Secondary | ICD-10-CM | POA: Diagnosis not present

## 2021-04-21 DIAGNOSIS — T3695XA Adverse effect of unspecified systemic antibiotic, initial encounter: Secondary | ICD-10-CM

## 2021-04-21 DIAGNOSIS — Z20822 Contact with and (suspected) exposure to covid-19: Secondary | ICD-10-CM | POA: Diagnosis present

## 2021-04-21 DIAGNOSIS — N939 Abnormal uterine and vaginal bleeding, unspecified: Secondary | ICD-10-CM | POA: Diagnosis present

## 2021-04-21 DIAGNOSIS — J449 Chronic obstructive pulmonary disease, unspecified: Secondary | ICD-10-CM | POA: Diagnosis present

## 2021-04-21 DIAGNOSIS — M60852 Other myositis, left thigh: Secondary | ICD-10-CM | POA: Diagnosis present

## 2021-04-21 DIAGNOSIS — Z8249 Family history of ischemic heart disease and other diseases of the circulatory system: Secondary | ICD-10-CM | POA: Diagnosis not present

## 2021-04-21 DIAGNOSIS — S32591A Other specified fracture of right pubis, initial encounter for closed fracture: Secondary | ICD-10-CM | POA: Diagnosis present

## 2021-04-21 DIAGNOSIS — F17210 Nicotine dependence, cigarettes, uncomplicated: Secondary | ICD-10-CM | POA: Diagnosis present

## 2021-04-21 DIAGNOSIS — M8668 Other chronic osteomyelitis, other site: Secondary | ICD-10-CM | POA: Diagnosis present

## 2021-04-21 DIAGNOSIS — I5032 Chronic diastolic (congestive) heart failure: Secondary | ICD-10-CM

## 2021-04-21 DIAGNOSIS — S32501A Unspecified fracture of right pubis, initial encounter for closed fracture: Secondary | ICD-10-CM

## 2021-04-21 DIAGNOSIS — N179 Acute kidney failure, unspecified: Secondary | ICD-10-CM | POA: Diagnosis present

## 2021-04-21 DIAGNOSIS — N1330 Unspecified hydronephrosis: Secondary | ICD-10-CM | POA: Diagnosis present

## 2021-04-21 DIAGNOSIS — M009 Pyogenic arthritis, unspecified: Secondary | ICD-10-CM | POA: Diagnosis present

## 2021-04-21 DIAGNOSIS — Z8559 Personal history of malignant neoplasm of other urinary tract organ: Secondary | ICD-10-CM | POA: Diagnosis not present

## 2021-04-21 DIAGNOSIS — K521 Toxic gastroenteritis and colitis: Secondary | ICD-10-CM | POA: Diagnosis present

## 2021-04-21 DIAGNOSIS — C680 Malignant neoplasm of urethra: Secondary | ICD-10-CM

## 2021-04-21 DIAGNOSIS — Z79899 Other long term (current) drug therapy: Secondary | ICD-10-CM | POA: Diagnosis not present

## 2021-04-21 DIAGNOSIS — T361X5A Adverse effect of cephalosporins and other beta-lactam antibiotics, initial encounter: Secondary | ICD-10-CM | POA: Diagnosis present

## 2021-04-21 DIAGNOSIS — Z7982 Long term (current) use of aspirin: Secondary | ICD-10-CM | POA: Diagnosis not present

## 2021-04-21 DIAGNOSIS — X58XXXA Exposure to other specified factors, initial encounter: Secondary | ICD-10-CM | POA: Diagnosis present

## 2021-04-21 LAB — CBC
HCT: 25.5 % — ABNORMAL LOW (ref 36.0–46.0)
Hemoglobin: 8.5 g/dL — ABNORMAL LOW (ref 12.0–15.0)
MCH: 28 pg (ref 26.0–34.0)
MCHC: 33.3 g/dL (ref 30.0–36.0)
MCV: 83.9 fL (ref 80.0–100.0)
Platelets: 533 10*3/uL — ABNORMAL HIGH (ref 150–400)
RBC: 3.04 MIL/uL — ABNORMAL LOW (ref 3.87–5.11)
RDW: 14.9 % (ref 11.5–15.5)
WBC: 6.6 10*3/uL (ref 4.0–10.5)
nRBC: 0 % (ref 0.0–0.2)

## 2021-04-21 LAB — BASIC METABOLIC PANEL
Anion gap: 8 (ref 5–15)
BUN: 40 mg/dL — ABNORMAL HIGH (ref 8–23)
CO2: 15 mmol/L — ABNORMAL LOW (ref 22–32)
Calcium: 7.4 mg/dL — ABNORMAL LOW (ref 8.9–10.3)
Chloride: 114 mmol/L — ABNORMAL HIGH (ref 98–111)
Creatinine, Ser: 1.62 mg/dL — ABNORMAL HIGH (ref 0.44–1.00)
GFR, Estimated: 35 mL/min — ABNORMAL LOW (ref >60)
Glucose, Bld: 113 mg/dL — ABNORMAL HIGH (ref 70–99)
Potassium: 3 mmol/L — ABNORMAL LOW (ref 3.5–5.1)
Sodium: 137 mmol/L (ref 135–145)

## 2021-04-21 LAB — BRAIN NATRIURETIC PEPTIDE: B Natriuretic Peptide: 76.1 pg/mL (ref 0.0–100.0)

## 2021-04-21 LAB — SEDIMENTATION RATE: Sed Rate: 46 mm/hr — ABNORMAL HIGH (ref 0–22)

## 2021-04-21 LAB — MAGNESIUM: Magnesium: 1.2 mg/dL — ABNORMAL LOW (ref 1.7–2.4)

## 2021-04-21 LAB — C-REACTIVE PROTEIN: CRP: 4.6 mg/dL — ABNORMAL HIGH (ref <1.0)

## 2021-04-21 MED ORDER — MAGNESIUM SULFATE IN D5W 1-5 GM/100ML-% IV SOLN
1.0000 g | Freq: Once | INTRAVENOUS | Status: AC
  Administered 2021-04-21: 1 g via INTRAVENOUS
  Filled 2021-04-21: qty 100

## 2021-04-21 MED ORDER — METHOCARBAMOL 500 MG PO TABS
500.0000 mg | ORAL_TABLET | Freq: Three times a day (TID) | ORAL | Status: DC | PRN
  Administered 2021-04-21: 500 mg via ORAL
  Filled 2021-04-21: qty 1

## 2021-04-21 MED ORDER — POTASSIUM CHLORIDE CRYS ER 20 MEQ PO TBCR
20.0000 meq | EXTENDED_RELEASE_TABLET | Freq: Once | ORAL | Status: AC
  Administered 2021-04-21: 20 meq via ORAL
  Filled 2021-04-21: qty 1

## 2021-04-21 MED ORDER — GADOBUTROL 1 MMOL/ML IV SOLN
6.0000 mL | Freq: Once | INTRAVENOUS | Status: AC | PRN
  Administered 2021-04-21: 6 mL via INTRAVENOUS

## 2021-04-21 MED ORDER — SODIUM CHLORIDE 0.9% FLUSH: 10.0000 mL | INTRAVENOUS | Status: DC | PRN

## 2021-04-21 MED ORDER — POTASSIUM CHLORIDE CRYS ER 20 MEQ PO TBCR
20.0000 meq | EXTENDED_RELEASE_TABLET | Freq: Once | ORAL | Status: AC
Start: 2021-04-21 — End: 2021-04-21
  Administered 2021-04-21: 20 meq via ORAL
  Filled 2021-04-21: qty 1

## 2021-04-21 MED ORDER — FUROSEMIDE 40 MG PO TABS: 40.0000 mg | ORAL_TABLET | Freq: Every day | ORAL | Status: DC

## 2021-04-21 MED ORDER — SODIUM CHLORIDE 0.9% FLUSH
10.0000 mL | Freq: Two times a day (BID) | INTRAVENOUS | Status: DC
  Administered 2021-04-22: 10 mL

## 2021-04-21 MED ORDER — ONDANSETRON HCL 4 MG PO TABS: 4.0000 mg | ORAL_TABLET | Freq: Four times a day (QID) | ORAL | Status: DC | PRN

## 2021-04-21 MED ORDER — ASPIRIN EC 81 MG PO TBEC
81.0000 mg | DELAYED_RELEASE_TABLET | Freq: Every day | ORAL | Status: DC
  Administered 2021-04-21 – 2021-04-24 (×4): 81 mg via ORAL
  Filled 2021-04-21 (×4): qty 1

## 2021-04-21 MED ORDER — ATORVASTATIN CALCIUM 10 MG PO TABS
10.0000 mg | ORAL_TABLET | Freq: Every day | ORAL | Status: DC
  Administered 2021-04-21 – 2021-04-23 (×3): 10 mg via ORAL
  Filled 2021-04-21 (×3): qty 1

## 2021-04-21 MED ORDER — SODIUM CHLORIDE 0.9 % IV SOLN
1.0000 g | Freq: Two times a day (BID) | INTRAVENOUS | Status: DC
  Administered 2021-04-21 – 2021-04-24 (×7): 1 g via INTRAVENOUS
  Filled 2021-04-21 (×3): qty 20
  Filled 2021-04-21: qty 1
  Filled 2021-04-21 (×2): qty 20
  Filled 2021-04-21: qty 1
  Filled 2021-04-21: qty 20

## 2021-04-21 MED ORDER — FERROUS SULFATE 325 (65 FE) MG PO TABS
325.0000 mg | ORAL_TABLET | Freq: Every day | ORAL | Status: DC
  Administered 2021-04-22 – 2021-04-24 (×3): 325 mg via ORAL
  Filled 2021-04-21 (×3): qty 1

## 2021-04-21 MED ORDER — CHLORHEXIDINE GLUCONATE CLOTH 2 % EX PADS
6.0000 | MEDICATED_PAD | Freq: Every day | CUTANEOUS | Status: DC
  Administered 2021-04-21 – 2021-04-24 (×4): 6 via TOPICAL

## 2021-04-21 MED ORDER — VITAMIN D3 25 MCG (1000 UNIT) PO TABS
2000.0000 [IU] | ORAL_TABLET | Freq: Every day | ORAL | Status: DC
  Administered 2021-04-21 – 2021-04-24 (×4): 2000 [IU] via ORAL
  Filled 2021-04-21 (×5): qty 2

## 2021-04-21 MED ORDER — DULOXETINE HCL 30 MG PO CPEP
30.0000 mg | ORAL_CAPSULE | Freq: Every day | ORAL | Status: DC
Start: 2021-04-21 — End: 2021-04-24
  Administered 2021-04-21 – 2021-04-23 (×3): 30 mg via ORAL
  Filled 2021-04-21 (×3): qty 1

## 2021-04-21 MED ORDER — ONDANSETRON HCL 4 MG/2ML IJ SOLN
4.0000 mg | Freq: Four times a day (QID) | INTRAMUSCULAR | Status: DC | PRN
Start: 2021-04-21 — End: 2021-04-24

## 2021-04-21 MED ORDER — POTASSIUM CHLORIDE 20 MEQ PO PACK
20.0000 meq | PACK | Freq: Every day | ORAL | Status: DC
  Filled 2021-04-21: qty 1

## 2021-04-21 MED ORDER — POTASSIUM CHLORIDE 10 MEQ/100ML IV SOLN
10.0000 meq | INTRAVENOUS | Status: AC
  Administered 2021-04-21 (×4): 10 meq via INTRAVENOUS
  Filled 2021-04-21 (×4): qty 100

## 2021-04-21 MED ORDER — HYDROCODONE-ACETAMINOPHEN 5-325 MG PO TABS
1.0000 | ORAL_TABLET | Freq: Four times a day (QID) | ORAL | Status: DC | PRN
  Administered 2021-04-21 – 2021-04-24 (×6): 1 via ORAL
  Filled 2021-04-21 (×6): qty 1

## 2021-04-21 MED ORDER — ACETAMINOPHEN 325 MG PO TABS: 650.0000 mg | ORAL_TABLET | Freq: Four times a day (QID) | ORAL | Status: DC | PRN

## 2021-04-21 MED ORDER — RISAQUAD PO CAPS
1.0000 | ORAL_CAPSULE | Freq: Every day | ORAL | Status: DC
  Administered 2021-04-21 – 2021-04-24 (×4): 1 via ORAL
  Filled 2021-04-21 (×5): qty 1

## 2021-04-21 MED ORDER — ADULT MULTIVITAMIN W/MINERALS CH
ORAL_TABLET | Freq: Every day | ORAL | Status: DC
  Administered 2021-04-21 – 2021-04-24 (×4): 1 via ORAL
  Filled 2021-04-21 (×4): qty 1

## 2021-04-21 MED ORDER — ACETAMINOPHEN 650 MG RE SUPP
650.0000 mg | Freq: Four times a day (QID) | RECTAL | Status: DC | PRN
Start: 2021-04-21 — End: 2021-04-24

## 2021-04-21 MED ORDER — ARIPIPRAZOLE 5 MG PO TABS
30.0000 mg | ORAL_TABLET | Freq: Every day | ORAL | Status: DC
  Administered 2021-04-21 – 2021-04-24 (×4): 30 mg via ORAL
  Filled 2021-04-21 (×4): qty 6

## 2021-04-21 MED ORDER — CALCITRIOL 0.25 MCG PO CAPS
0.2500 ug | ORAL_CAPSULE | Freq: Every day | ORAL | Status: DC
  Administered 2021-04-21 – 2021-04-24 (×4): 0.25 ug via ORAL
  Filled 2021-04-21 (×4): qty 1

## 2021-04-21 NOTE — Assessment & Plan Note (Addendum)
-  Resolved ?Vaginal bleeding, "spotting".  Not large volume bleed. ?-S/p hysterectomy previously--patient reports it was many years ago, in [place] ? ?HGB down slightly. ?1. No blood thinners ?2. Monitor CBC ?3. EDP attempted speculum exam but pt unable to tolerate. ?4. Consulted OB/GYN for further evaluation recommendation -reporting of no specific findings..  ? ?5. However wondering if this is related to the pubic bone osteomyelitis / fracture, and not OB in nature? ?6. History of hysterectomy, no further finding specified to vaginal/uterine with exception of inflammatory process ?

## 2021-04-21 NOTE — Assessment & Plan Note (Addendum)
Electrolytes were corrected ? ?

## 2021-04-21 NOTE — Hospital Course (Signed)
? ?  Shelia Chan is a 66 y.o. female with medical history significant of uretheral CA s/p bladder resection, ileal conduit, CKD 3-4, dCHF. ?  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  Pubic bone biopsy for cultures NGTD.  Pt ultimately put on 6 weeks of Merrem per ID. ?  ?She has diarrhea since last hospital stay, constant, unchanged, not worsening. May be due to Believed to be due to ABx. ?  ?She reports  onset of Right pelvic pain a few days after discharge from last admit. ?  ?Presents to ED with c/o new onset vaginal bleeding, described as "spotting" for the past 4 days.  Symptoms constant and ongoing.  Pt has had hysterectomy in past. ?  ?Due to anatomy from various surgeries, pt was unable to tolerate EDP attempting to do a speculum exam. ? No fever, chills, N/V. 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 ? ?Admission: For concern of ongoing osteomyelitis of pubic bone, subacute fracture of pubic bone..  Likely evaluation from ID and urology possible GYN ? ?ED:  ?Vitals; BP 119/57, pulse 58, RR 16, satting 100% room air, temp 97.4 ?K 2.8,  ?Bicarb 16 (down from 30 last month) ?Creat 1.91 up from 1.7 a month ago, BUN 41 up from 25 a month ago. ?HGB 9.6 down from 11.1 in early March ?WBC: 7.6 ? ?CTAP = IMPRESSION: ?1. New subacute appearing fracture of the right pubic bone. New new ?air in the pubic symphysis which may be secondary to fracture, but ?septic arthritis is not excluded given the clinical history. ?2. Edema within the soft tissue surrounding the pubic symphysis and ?in the right iliacus muscle similar to the prior study. No definite ?fluid collection identified allowing for lack of contrast. ?3. Subcutaneous edema in the lower anterior pelvis may be related to ?cellulitis. ?4. Questionable small amount of free fluid and free air in the lower ?left pelvis, extraluminal. Abscess can  not be excluded in this ?region. Bowel perforation can also not be excluded in this region. ?5. New moderate severe right-sided hydronephrosis. Bilateral ?ureteral stents remain in adequate position. ?6. Air-fluid levels throughout the colon can be seen in the setting ?of diarrhea. ?

## 2021-04-21 NOTE — Assessment & Plan Note (Addendum)
This ongoing for several weeks. ?Improved ?No WBC nor fever. ? ID was not impressed for C.Diff . ?Holding off on ordering any C.Diff testing. ?No signs of overt infection such as SIRS,-leukocytosis, afebrile, normotensive ?

## 2021-04-21 NOTE — Assessment & Plan Note (Addendum)
S/p surgery ureteral bladder resection ?Urostomy in place, clean urine output noted ?Currently believed to be in long term remission as of 2016. ? ?-With chronic hydronephrosis and stent which is changed routinely by IR ?-Urologist was consulted, recommended no further work-up ?-IR consulted for stent exchange. ?

## 2021-04-21 NOTE — Consult Note (Signed)
?Urology Consult  ? ?Physician requesting consult: Skipper Cliche ? ?Reason for consult: Right sided hydronephrosis ? ?History of Present Illness: Shelia Chan is a 66 y.o. female pt with PMH significant for asthma, CHF, COPD, CKD, Squamous cell carcinoma of the urethra, and pubic bone osteomyelitis who was admitted on 04/21/21 for eval of new onset vaginal bleeding. She is s/p cystectomy, urethrectomy, and ileal conduit formation in 2015.  She has a hx of chronic bilateral hydronephrosis which requires ureteral stents that are changed q 6 weeks by IR.  She is followed by Dr. Alen Blew and Dr. Claudia Desanctis. As per her last visit with Dr. Alen Blew on 11/18/20, she remains disease free without evidence of relapse of urethral cancer. ? ?CT A/P was performed on admission and showed new subacute appearing fracture of the right pubic bone, new air in pubic symphysis, subq edema in lower anterior pelvis that may be related to cellulitis, questionable small amount of free fluid and air in the lower left pelvis, new moderate severe right sided hydro with bilateral ureteral stents in adequate position. WBC 6.6, Cr 1.62 (baseline 1.6-1.9). No UA checked.  ? ?Pt is resting but states she is having discomfort in lower abdomen and occasional right sided back pain. She denies F/C, HA, CP, SOB, and N/V.  She has persistent diarrhea. She has consistently had good UO. ? ? ?Past Medical History:  ?Diagnosis Date  ? Arthritis   ? Asthma   ? Blood transfusion without reported diagnosis   ? Cancer [facility])   ? CHF (congestive heart failure) ([place])   ? COPD (chronic obstructive pulmonary disease) ([place])   ? Heart murmur   ? Immune deficiency disorder ([place])   ? Renal disorder   ? ? ?Past Surgical History:  ?Procedure Laterality Date  ? BLADDER REMOVAL  118/22/2015  ? FRACTURE SURGERY Left 02/2018  ? pins  ? IR CATHETER TUBE CHANGE  04/15/2019  ? IR CATHETER TUBE CHANGE  04/15/2019  ? IR EXT NEPHROURETERAL CATH EXCHANGE  07/15/2019  ? IR EXT NEPHROURETERAL  CATH EXCHANGE  10/14/2019  ? IR EXT NEPHROURETERAL CATH EXCHANGE  10/14/2019  ? IR EXT NEPHROURETERAL CATH EXCHANGE  01/06/2020  ? IR EXT NEPHROURETERAL CATH EXCHANGE  03/16/2020  ? IR EXT NEPHROURETERAL CATH EXCHANGE  05/25/2020  ? IR EXT NEPHROURETERAL CATH EXCHANGE  07/17/2020  ? IR EXT NEPHROURETERAL CATH EXCHANGE  07/17/2020  ? IR EXT NEPHROURETERAL CATH EXCHANGE  08/14/2020  ? IR EXT NEPHROURETERAL CATH EXCHANGE  09/25/2020  ? IR EXT NEPHROURETERAL CATH EXCHANGE  11/06/2020  ? IR EXT NEPHROURETERAL CATH EXCHANGE  12/25/2020  ? IR EXT NEPHROURETERAL CATH EXCHANGE  02/09/2021  ? IR EXT NEPHROURETERAL CATH EXCHANGE  03/19/2021  ? IR EXT NEPHROURETERAL CATH EXCHANGE  03/19/2021  ? IR PERC TUN PERIT CATH WO PORT S&I /IMAG  03/25/2021  ? ? ? ?Current Hospital Medications: ? ?Home meds:  ?. ?Current Meds  ?Medication Sig  ? acetaminophen (TYLENOL) 500 MG tablet Take 500-1,000 mg by mouth every 6 (six) hours as needed for moderate pain or fever.  ? albuterol (VENTOLIN HFA) 108 (90 Base) MCG/ACT inhaler Inhale 1 puff into the lungs every 6 (six) hours as needed for shortness of breath.  ? ARIPiprazole (ABILIFY) 30 MG tablet Take 30 mg by mouth daily.  ? aspirin EC 81 MG tablet Take 81 mg by mouth daily.  ? atorvastatin (LIPITOR) 10 MG tablet Take 10 mg by mouth at bedtime.  ? B Complex-C (B-COMPLEX WITH VITAMIN C) tablet Take  1 tablet by mouth daily.  ? busPIRone (BUSPAR) 7.5 MG tablet Take 7.5 mg by mouth 2 (two) times daily as needed (anxiety).  ? calcitRIOL (ROCALTROL) 0.25 MCG capsule Take 0.25 mcg by mouth daily.  ? Cholecalciferol (VITAMIN D-3) 25 MCG (1000 UT) CAPS Take 2,000 Units by mouth daily.  ? DULoxetine (CYMBALTA) 30 MG capsule Take 30 mg by mouth at bedtime.  ? ferrous sulfate 324 MG TBEC Take 324 mg by mouth daily with breakfast.  ? furosemide (LASIX) 40 MG tablet Take 40 mg by mouth daily.  ? HYDROcodone-acetaminophen (NORCO/VICODIN) 5-325 MG tablet Take 1 tablet by mouth every 8 hours as needed for pain  ? meropenem  (MERREM) IVPB Inject 1 g into the vein every 12 (twelve) hours. Indication:  Pubic osteo ?First Dose: Yes ?Last Day of Therapy:  04/30/21 ?Labs - Once weekly:  CBC/D and BMP, ?Labs - Every other week:  ESR and CRP ?Method of administration: Mini-Bag Plus / Gravity ?Method of administration may be changed at the discretion of home infusion pharmacist based upon assessment of the patient and/or caregiver's ability to self-administer the medication ordered.  ? methocarbamol (ROBAXIN) 500 MG tablet Take 500 mg by mouth 3 (three) times daily as needed for muscle spasms.  ? Multiple Vitamin (MULTIVITAMIN ADULT PO) Take 1 tablet by mouth daily.  ? pantoprazole (PROTONIX) 40 MG tablet Take 40 mg by mouth daily.  ? potassium chloride (KLOR-CON) 20 MEQ packet Take 20 mEq by mouth daily.  ? Probiotic Product (PROBIOTIC PO) Take 1 capsule by mouth daily.  ? vitamin C (ASCORBIC ACID) 500 MG tablet Take 500 mg by mouth 2 (two) times a week.  ? ? ? ?Scheduled Meds: ? acidophilus  1 capsule Oral Daily  ? ARIPiprazole  30 mg Oral Daily  ? aspirin EC  81 mg Oral Daily  ? atorvastatin  10 mg Oral QHS  ? calcitRIOL  0.25 mcg Oral Daily  ? Chlorhexidine Gluconate Cloth  6 each Topical Daily  ? cholecalciferol  2,000 Units Oral Daily  ? DULoxetine  30 mg Oral QHS  ? [START ON 04/22/2021] ferrous sulfate  325 mg Oral Q breakfast  ? [START ON 04/22/2021] furosemide  40 mg Oral Daily  ? multivitamin with minerals   Oral Daily  ? potassium chloride  20 mEq Oral Daily  ? sodium chloride flush  10-40 mL Intracatheter Q12H  ? ?Continuous Infusions: ? meropenem (MERREM) IV Stopped (04/21/21 0536)  ? ?PRN Meds:.acetaminophen **OR** acetaminophen, HYDROcodone-acetaminophen, methocarbamol, ondansetron **OR** ondansetron (ZOFRAN) IV, sodium chloride flush ? ?Allergies:  ?Allergies  ?Allergen Reactions  ? Metoprolol Shortness Of Breath  ? ? ?Family History  ?Problem Relation Age of Onset  ? Heart disease Maternal Grandfather   ? ? ?Social History:   reports that she has been smoking cigarettes and e-cigarettes. She has a 50.00 pack-year smoking history. She has never used smokeless tobacco. She reports that she does not currently use alcohol. She reports current drug use. Drug: Marijuana. ? ?ROS: ?A complete review of systems was performed.  All systems are negative except for pertinent findings as noted. ? ?Physical Exam:  ?Vital signs in last 24 hours: ?Temp:  [97.4 ?F (36.3 ?C)-98.3 ?F (36.8 ?C)] 98.2 ?F (36.8 ?C) (04/05 1312) ?Pulse Rate:  [54-89] 64 (04/05 1312) ?Resp:  [14-24] 15 (04/05 1312) ?BP: (106-129)/(50-77) 111/66 (04/05 1312) ?SpO2:  [99 %-100 %] 100 % (04/05 1312) ?Weight:  [59.9 kg] 59.9 kg (04/05 0717) ?Constitutional:  Alert and oriented, No acute  distress ?Cardiovascular: Regular rate and rhythm ?Respiratory: Normal respiratory effort ?GI: Abdomen is soft, nontender, nondistended, no abdominal masses ?GU: urostomy pink with clear urine in bag; stents noted in bag ?Lymphatic: No lymphadenopathy ?Neurologic: Grossly intact, no focal deficits ?Psychiatric: Normal mood and affect ? ?Laboratory Data:  ?Recent Labs  ?  04/20/21 ?2158 04/21/21 ?4175  ?WBC 7.6 6.6  ?HGB 9.6* 8.5*  ?HCT 28.2* 25.5*  ?PLT 589* 533*  ? ? ?Recent Labs  ?  04/20/21 ?2158 04/21/21 ?3010  ?NA 136 137  ?K 2.8* 3.0*  ?CL 113* 114*  ?GLUCOSE 106* 113*  ?BUN 41* 40*  ?CALCIUM 7.6* 7.4*  ?CREATININE 1.91* 1.62*  ? ? ? ?Results for orders placed or performed during the hospital encounter of 04/20/21 (from the past 24 hour(s))  ?CBC with Differential     Status: Abnormal  ? Collection Time: 04/20/21  9:58 PM  ?Result Value Ref Range  ? WBC 7.6 4.0 - 10.5 K/uL  ? RBC 3.40 (L) 3.87 - 5.11 MIL/uL  ? Hemoglobin 9.6 (L) 12.0 - 15.0 g/dL  ? HCT 28.2 (L) 36.0 - 46.0 %  ? MCV 82.9 80.0 - 100.0 fL  ? MCH 28.2 26.0 - 34.0 pg  ? MCHC 34.0 30.0 - 36.0 g/dL  ? RDW 14.9 11.5 - 15.5 %  ? Platelets 589 (H) 150 - 400 K/uL  ? nRBC 0.0 0.0 - 0.2 %  ? Neutrophils Relative % 64 %  ? Neutro Abs 4.9  1.7 - 7.7 K/uL  ? Lymphocytes Relative 23 %  ? Lymphs Abs 1.7 0.7 - 4.0 K/uL  ? Monocytes Relative 11 %  ? Monocytes Absolute 0.8 0.1 - 1.0 K/uL  ? Eosinophils Relative 1 %  ? Eosinophils Absolute 0.1 0.0 - 0.5 K/

## 2021-04-21 NOTE — Consult Note (Signed)
Patient was previously seen by [facility] (< 90 days ago) at her last admission. Please contact their office for further care.  ? ?Noemi Chapel, PA-C ?04/21/21 ?

## 2021-04-21 NOTE — Consult Note (Addendum)
? ? ?[facility]  ?                                                                                     ? ?Patient Identification: ?Patient Name: Shelia Chan MRN: [id number] [place] Date: 04/20/2021  7:47 PM ?Today's Date: 04/21/2021 ?Reason for consult: osteomyelitis  ?Requesting provider: Skipper Cliche ? ?Principal Problem: ?  Pubic bone Osteomyelitis, bilaterally, with myositis ?Active Problems: ?  CKD (chronic kidney disease), stage IV ([place]) ?  Hydronephrosis of right kidney ?  Chronic diastolic CHF (congestive heart failure) ([place]) ?  Vaginal discharge ?  Squamous cell carcinoma of the urethra ?  Antibiotic-associated diarrhea ?  Hypokalemia ? ? ?Antibiotics: None  ? ?Lines/Hardware: RT neck CVC, RLQ urostomy, left and rt ureteral stent ? 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 ? ?# Subacute Fracture of rt pubic bone with air in the pubic symphysis (cannot exclude septic arthritis) ? ?# Small free fluid/gas in the lower left pelvis, extraluminal, r/o bowel perforation and abscess - Unlikely to be an abscess with non supportive findings on abdominal exam. MRI pelvis is pending ? ?# New moderate severe rt sided hydronephrosis, h/o bilateral ureteral stents- Cr is improving ? ?# Bilateral pubic bone osteomyelitis ( path + for acute osteomyelitis, Cx negative to date) with h/o ESBL E coli and PsA - on meropenem with EOT 04/30/21 ? ?# Antibiotic induced diarrhea - has had diarrhea since being on abtx, multiple episodes but no abdominal cramps, fevers or luekocytosis, no foul smell, will monitor for now  ? ?# AKI on CKD- improving  ? ?# Vaginal  spotting- has h/o hysterectomy ? 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 ?Recommendations  ?-Continue Meropenem as is.  ?-Urology has been consulted in light of h/o carcinoma of the urethra and vaginal spotting. Possible vaginal spotting could be related to pelvic  fracture. Gyn consult for pelvic exam  ?-IR has been consulted given rt sided hydronephrosis ?- Consult orthopedics given # of rt pubic bone  ?-Following  ? ?Rest of the management as per the primary team. Please call with questions or concerns.  ?Thank you for the consult ? ?Rosiland Oz, MD ?Infectious Disease Physician ?[facility] ?[place] [address] ?[place], [place] [address] ?Phone: [phone]  Fax: [phone] ? 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  She also had new pain to the right lower abdomen/hip area a week after she was discharged on 3/9.  She also had worsening  lower extremity swelling despite increasing her diuretic. She denies any fevers, chills, sweats. Denies any abdominal cramps or fall.s She has had diarrhea since being on abtx approx a month ago. Denies any abdominal cramps and foul smell. ? ?ROS: ?General- Denies fever, chills, loss of appetite and loss of weight ?HEENT - Denies headache, blurry vision, neck pain, sinus pain ?Chest - Denies any chest pain, SOB or cough ?CVS- Denies any dizziness/lightheadedness, syncopal attacks, palpitations ?Abdomen- Denies  any nausea, vomiting, rt sided lower abdominal pain and diarrhea + ?Neuro - Denies any weakness, numbness, tingling sensation ?Psych - Denies any changes in mood irritability or depressive symptoms ?GU- Denies any burning, dysuria, hematuria or increased frequency of urination ?Skin - denies any rashes/lesions ?MSK - denies any joint pain/swelling or restricted ROM  ? ?Past Medical History:  ?Diagnosis Date  ? Arthritis   ? Asthma   ? Blood transfusion without reported diagnosis   ? Cancer [facility])   ? CHF (congestive heart failure) ([place])   ? COPD (chronic obstructive pulmonary disease) ([place])   ? Heart murmur   ? Immune deficiency disorder ([place])   ? Renal disorder   ? ?Past Surgical History:  ?Procedure Laterality Date  ? BLADDER REMOVAL  118/22/2015  ? FRACTURE SURGERY Left 02/2018  ? pins  ? IR CATHETER TUBE CHANGE  04/15/2019  ? IR CATHETER TUBE CHANGE  04/15/2019  ? IR EXT NEPHROURETERAL CATH EXCHANGE  07/15/2019  ? IR EXT NEPHROURETERAL CATH EXCHANGE  10/14/2019  ? IR EXT NEPHROURETERAL CATH EXCHANGE  10/14/2019  ? IR EXT NEPHROURETERAL CATH EXCHANGE  01/06/2020  ? IR EXT NEPHROURETERAL CATH EXCHANGE  03/16/2020  ? IR EXT NEPHROURETERAL CATH EXCHANGE  05/25/2020  ? IR EXT NEPHROURETERAL CATH EXCHANGE  07/17/2020  ? IR EXT NEPHROURETERAL CATH EXCHANGE  07/17/2020  ? IR EXT NEPHROURETERAL CATH EXCHANGE  08/14/2020  ? IR EXT NEPHROURETERAL CATH EXCHANGE  09/25/2020  ? IR EXT NEPHROURETERAL CATH EXCHANGE  11/06/2020  ? IR EXT NEPHROURETERAL CATH EXCHANGE  12/25/2020  ? IR EXT NEPHROURETERAL CATH EXCHANGE  02/09/2021  ? IR EXT NEPHROURETERAL CATH EXCHANGE  03/19/2021  ? IR EXT NEPHROURETERAL CATH EXCHANGE  03/19/2021  ? IR PERC TUN PERIT CATH WO PORT S&I /IMAG  03/25/2021  ? ? ? ?Scheduled Meds: ? Chlorhexidine Gluconate Cloth  6 each Topical Daily  ? potassium chloride  20 mEq Oral Once  ? sodium chloride flush  10-40 mL Intracatheter Q12H  ? ?Continuous Infusions: ? magnesium sulfate bolus IVPB    ? meropenem (MERREM) IV Stopped (04/21/21  0536)  ? ?PRN Meds:.acetaminophen **OR** acetaminophen, HYDROcodone-acetaminophen, ondansetron **OR** ondansetron (ZOFRAN) IV, sodium chloride flush ? ?Allergies  ?Allergen Reactions  ? Metoprolol Shortness Of Breath  ? ?Social History  ? ?Socioeconomic History  ? Marital status: Divorced  ?  Spouse name: Not on file  ? Number of children: Not on file  ? Years of education: Not on file  ? Highest education level: Not on file  ?Occupational History  ? Occupation: disabled  ?Tobacco Use  ? Smoking status: Some Days  ?  Packs/day: 1.00  ?  Years: 50.00  ?  Pack years: 50.00  ?  Types: Cigarettes, E-cigarettes  ?  Last attempt to quit: 01/21/2019  ?  Years since quitting: 2.2  ? Smokeless tobacco: Never  ? Tobacco comments:  ?  Quit smoking but vapes occasionally  ?Vaping Use  ? Vaping Use: Never used  ?Substance and Sexual Activity  ? Alcohol use: Not  Currently  ? Drug use: Yes  ?  Types: Marijuana  ?  Comment: occasional   ? Sexual activity: Not on file  ?Other Topics Concern  ? Not on file  ?Social History Narrative  ? Not on file  ? ?Social Determinants of Health  ? ?Financial Resource Strain: Not on file  ?Food Insecurity: Not on file  ?Transportation Needs: Not on file  ?Physical Activity: Not on file  ?Stress: Not on file  ?Social Connections: Not on file  ?Intimate Partner Violence: Not on file  ? ? ?Breast Cancer-relatedfamily history is not on file. ? ?Vitals ?BP (!) 116/50 (BP Location: Left Arm)   Pulse 66   Temp 98.1 ?F (36.7 ?C) (Oral)   Resp 20   Ht '5\' 1"'$  (1.549 m)   Wt 59.9 kg   SpO2 100%   BMI 24.95 kg/m?  ? ? ?Physical Exam ?Constitutional:  sitting up in the bed and appears comfortable  ?   Comments: very thin  ? ?Cardiovascular:  ?   Rate and Rhythm: Normal rate and regular rhythm.  ?   Heart sounds: systolic murmur+ ? ?Pulmonary:  ?   Effort: Pulmonary effort is normal on room air  ?   Comments: Bilateral equal air entry  ? ?Abdominal:  ?   Palpations: Abdomen is soft.  ?   Tenderness: non  tender and non distended, ileal conduits looks OK with clear urine ? ?Musculoskeletal:     ?   General: No swelling or tenderness.  2+ pitting LE Edema b/l  ? ?Skin: ?   Comments:  ? ?Neurological:  ?   General: grossl

## 2021-04-21 NOTE — Assessment & Plan Note (Addendum)
Chronic and stable, slightly worse than baseline ?See hydronephrosis above. ?-Monitoring BUN/creatinine closely ?Creatinine 1.91>> 1.62 >>1.96 >> 1.49 ? ?

## 2021-04-21 NOTE — Assessment & Plan Note (Addendum)
Continue to complain of tenderness, uncomfortable ?Pt remains on merrem at this time. ?1. Given new subacute fx of R pubic bone: ?1. Ordered repeat MRI of pelvis to evaluate ?2. ESR 46 (mildly elevated) ?3. CRP 4.6 (mildly elevated) ?4. Infectious disease team consulted--- appreciate input -current recommendation is to continue meropenem ? ?No signs of SIRS or sepsis ? ?2. Will continue merrem ?3. Orthopedic surgery recs per above ?MRI of pelvic: Reviewed-reported ?Continued substantial pubic osteomyelitis/septic joint, including ?a fracture of the right pubic body. The amount of associated edema ?is increased compared to 03/16/2021, and there is substantial ?regional myositis along the hip adductor musculature and right ?obturator internus. ? ?-Seen by Orthopedic team, infectious disease, per above. To continue IV meropenem per ID ? ?

## 2021-04-21 NOTE — Progress Notes (Signed)
?PROGRESS NOTE ? ? ? ?Patient: Shelia Chan                            PCP: [name], MD                    ?DOB: September 30, 1955            DOA: 04/20/2021 ?OHY:[id number]             DOS: 04/21/2021, 12:43 PM ? ? LOS: 0 days  ? ?Date of Service: The patient was seen and examined on 04/21/2021 ? ?Subjective:  ? ?The patient was seen and examined this morning. ?Hemodynamically stable ?Still complaining of: Pelvic pain, ?Urine in urostomy noted ? ?Brief Narrative:  ? ? ? ?Shelia Chan is a 66 y.o. female with medical history significant of uretheral CA s/p bladder resection, ileal conduit, CKD 3-4, dCHF. ?  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  Pubic bone biopsy for cultures NGTD.  Pt ultimately put on 6 weeks of Merrem per ID. ?  ?She has diarrhea since last hospital stay, constant, unchanged, not worsening. May be due to Believed to be due to ABx. ?  ?She reports  onset of Right pelvic pain a few days after discharge from last admit. ?  ?Presents to ED with c/o new onset vaginal bleeding, described as "spotting" for the past 4 days.  Symptoms constant and ongoing.  Pt has had hysterectomy in past. ?  ?Due to anatomy from various surgeries, pt was unable to tolerate EDP attempting to do a speculum exam. ? No fever, chills, N/V. 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 ? ?Admission: For concern of ongoing osteomyelitis of pubic bone, subacute fracture of pubic bone..  Likely evaluation from ID and urology possible GYN ? ?ED:  ?Vitals; BP 119/57, pulse 58, RR 16, satting 100% room air, temp 97.4 ?K 2.8,  ?Bicarb 16 (down from 30 last month) ?Creat 1.91 up from 1.7 a month ago, BUN 41 up from 25 a month ago. ?HGB 9.6 down from 11.1 in early March ?WBC: 7.6 ? ?CTAP = IMPRESSION: ?1. New subacute appearing fracture of the right pubic bone. New new ?air in the pubic symphysis which may be secondary  to fracture, but ?septic arthritis is not excluded given the clinical history. ?2. Edema within the soft tissue surrounding the pubic symphysis and ?in the right iliacus muscle similar to the prior study. No definite ?fluid collection identified allowing for lack of contrast. ?3. Subcutaneous edema in the lower anterior pelvis may be related to ?cellulitis. ?4. Questionable small amount of free fluid and free air in the lower ?left pelvis, extraluminal. Abscess can not be excluded in this ?region. Bowel perforation can also not be excluded in this region. ?5. New moderate severe right-sided hydronephrosis. Bilateral ?ureteral stents remain in adequate position. ?6. Air-fluid levels throughout the colon can be seen in the setting ?of diarrhea. ? ? ? ?Assessment & Plan:  ? ?Principal Problem: ?  Pubic bone Osteomyelitis, bilaterally, with myositis ?Active Problems: ?  Hydronephrosis of right kidney ?  Vaginal discharge ?  CKD (chronic kidney disease), stage IV ([place]) ?  Chronic diastolic CHF (congestive heart failure) ([place]) ?  Squamous cell carcinoma of the urethra ?  Antibiotic-associated diarrhea ?  Hypokalemia ? ? ? ? ?Assessment  and Plan: ?* Pubic bone Osteomyelitis, bilaterally, with myositis ?Pt remains on merrem at this time. ?Given new subacute fx of R pubic bone: ?Ordered repeat MRI of pelvis to evaluate ?ESR 46 (mildly elevated) ?CRP 4.6 (mildly elevated) ?Infectious disease team consulted--- appreciate input ? ?No signs of SIRS or sepsis ? ?Will continue merrem for the moment. ?Orthopedic team has been consulted for evaluation recommendation ? ?Vaginal discharge ?Vaginal bleeding, "spotting".  Not large volume bleed. ?-S/p hysterectomy previously--patient reports it was many years ago, in [place] ? ?HGB down slightly. ?No blood thinners ?Monitor CBC ?EDP attempted speculum exam but pt unable to tolerate. ?Consulted OB/GYN for further evaluation recommendation ? ?However wondering if this is related to the  pubic bone osteomyelitis / fracture, and not OB in nature? ?Obtaining MRI:  ? ?Hydronephrosis of right kidney ?New mod-severe hydro of R kidney despite good positioning of bilateral ureteral stents according to CT scan. ?Consulted urologist-discussed the case in detail, recommended consulting IR for stent exchange. ?Urologist was not concerned about chronic hydronephrosis ? ?Urostomy functional in place, clear urine noted, patient afebrile, normotensive with no leukocytosis ? ?Hypokalemia ?Replace K and Mg ?Potassium 2.8, 3.0 >>> ?Magnesium 1.2 >>>  ?Additional potassium and magnesium will be given ? ?Antibiotic-associated diarrhea ?This ongoing for several weeks. ?Unchanged today. ?No WBC nor fever. ?Didn't sound like ID was impressed for C.Diff when they saw pt in office. ?Ill hold off on ordering any C.Diff testing. ?No signs of overt infection such as SIRS,-leukocytosis, afebrile, normotensive ? ?Squamous cell carcinoma of the urethra ?S/p surgery ureteral bladder resection ?Urostomy in place, clear urine noted ?Currently believed to be in long term remission as of 2016. ? ?Does have new onset vaginal bleeding of unclear etiology however. ?-Urologist consulted,  confirm her status to be stable ? ?Chronic diastolic CHF (congestive heart failure) ([place]) ?-Med rec reviewed ?Holding lasix for the moment. ? ?CKD (chronic kidney disease), stage IV ([place]) ?Chronic and stable, slightly worse than baseline today. ?See hydronephrosis above. ?-Monitoring BUN/creatinine closely ?Creatinine 1.91>> 1.60  ? ? ? ? ?Consult: ID/urology/orthopedic/IR/GYN ?Appreciate everyone's effort and input in this case ? ? ? ? ? ? ?----------------------------------------------------------------------------------------------------------------------------------------------- ?Nutritional status:  ?The patient's BMI is: Body mass index is 24.95 kg/m?. ?I agree with the assessment and plan as outlined  ------------------------------------------------------------------------------------------------------------------------- ?Cultures; ?Blood Cultures x 2 >> NGT ?Urine Culture  >>> NGT  ? ? ?----------------------------------------------------------------------------------------------------------------- ? ?DVT prophylaxis:  ?SCDs Start: 04/21/21 0259 ? ? ?Code Status:   Code Status: DNR ? ?Family Communication: No family member present at bedside- attempt will be made to update daily ?The above findings and plan of care has been discussed with patient (and family)  in detail,  ?they expressed understanding and agreement of above. ?-Advance care planning has been discussed.  ? ?Admission status:   ?Status is: Observation ?The patient remains OBS appropriate and will d/c before 2 midnights. ? ? ? ? ?Procedures:  ? ?No admission procedures for hospital encounter. ? ? ?Antimicrobials:  ?Anti-infectives (From admission, onward)  ? ? Start     Dose/Rate Route Frequency Ordered Stop  ? 04/21/21 0400  meropenem (MERREM) 1 g in sodium chloride 0.9 % 100 mL IVPB       ? 1 g ?200 mL/hr over 30 Minutes Intravenous Every 12 hours 04/21/21 0311    ? ?  ? ? ? ?Medication:  ? ARIPiprazole  30 mg Oral Daily  ? aspirin EC  81 mg Oral Daily  ? atorvastatin  10 mg Oral QHS  ?  calcitRIOL  0.25 mcg Oral Daily  ? Chlorhexidine Gluconate Cloth  6 each Topical Daily  ? DULoxetine  30 mg Oral QHS  ? [START ON 04/22/2021] ferrous sulfate  324 mg Oral Q breakfast  ? [START ON 04/22/2021] furosemide  40 mg Oral Daily  ? Multivitamin Adult   Oral Daily  ? potassium chloride  20 mEq Oral Daily  ? Probiotic   Oral Daily  ? sodium chloride flush  10-40 mL Intracatheter Q12H  ? Vitamin D-3  2,000 Units Oral Daily  ? ? ?acetaminophen **OR** acetaminophen, HYDROcodone-acetaminophen, methocarbamol, ondansetron **OR** ondansetron (ZOFRAN) IV, sodium chloride flush ? ? ?Objective:  ? ?Vitals:  ? 04/21/21 9702 04/21/21 0434 04/21/21 0717 04/21/21 6378  ?BP:   108/64  (!) 116/50  ?Pulse:  (!) 56  66  ?Resp:  18  20  ?Temp: (!) 97.4 ?F (36.3 ?C) (!) 97.5 ?F (36.4 ?C)  98.1 ?F (36.7 ?C)  ?TempSrc: Oral Oral  Oral  ?SpO2:  100%  100%  ?Weight:  59.9 kg 59.9 kg   ?Height:  5' 1" (1.549 m)    ? ? ?Intake/Outpu

## 2021-04-21 NOTE — Assessment & Plan Note (Addendum)
-  Med rec reviewed ?-Briefly held lasix while in hospital ?

## 2021-04-21 NOTE — Telephone Encounter (Signed)
Called [facility] again. Spoke with her she states the pt was told to go to the hospital yesterday due to 10 to 10 pelvic pain and vaginal bleeding. Looking through her chart is currently admitted. Pt has an appointment with Raquel Sarna on 4/10.   ?

## 2021-04-21 NOTE — Assessment & Plan Note (Addendum)
-  severe hydro of R kidney despite good positioning of bilateral ureteral stents according to CT scan. ?1. Consulted urologist-discussed the case in detail, recommended consulting IR for stent exchange. ?2. Pending IR for exchange of stent today 04/23/2021 ? ?3. Urologist was not concerned about chronic hydronephrosis ? ?2. Urostomy functional,  in place, clear urine noted, patient afebrile, normotensive with no leukocytosis ? ?Patient to follow up with Urology as outpatient ?

## 2021-04-21 NOTE — H&P (Signed)
?History and Physical  ? ? ?Patient: Shelia Chan WJX:[id number] DOB: September 11, 1955 ?DOA: 04/20/2021 ?DOS: the patient was seen and examined on 04/21/2021 ?PCP: [name], MD  ?Patient coming from: Home ? ?Chief Complaint:  ?Chief Complaint  ?Patient presents with  ? Vaginal Bleeding  ? ?HPI: Shelia Chan is a 66 y.o. female with medical history significant of uretheral CA s/p bladder resection, ileal conduit, CKD 3-4, dCHF. ? 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  UA at that time grew out pseudomonas and an ESBL E.Coli.  Pubic bone biopsy for cultures NGTD.  Pt ultimately put on 6 weeks of Merrem per ID. ? ?Pt with diarrhea since last hospital stay, constant, unchanged, not worsening.  Believed to be due to ABx. ? ?Pt with onset of R pelvic pain a few days after discharge from last admit. ? ?Pt presents to ED with c/o new onset vaginal bleeding, described as "spotting" for the past 4 days.  Symptoms constant and ongoing.  Pt has had hysterectomy in past. ? ?Due to anatomy from various surgeries, pt was unable to tolerate EDP attempting to do a speculum exam. ? ?No fever, chills, N/V. ? 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 ? ?  ?Review of Systems: As mentioned in the history of present illness. All other systems reviewed and are negative. ?Past Medical History:  ?Diagnosis Date  ? Arthritis   ? Asthma   ? Blood transfusion without reported diagnosis   ? Cancer [facility])   ? CHF (congestive heart failure) ([place])   ? COPD (chronic obstructive pulmonary disease) ([place])   ? Heart murmur   ? Immune deficiency disorder ([place])   ? Renal disorder   ? ?Past Surgical History:  ?Procedure Laterality Date  ? BLADDER REMOVAL  118/22/2015  ? FRACTURE SURGERY Left 02/2018  ? pins  ? IR CATHETER TUBE CHANGE  04/15/2019  ? IR CATHETER TUBE CHANGE  04/15/2019  ? IR EXT NEPHROURETERAL CATH EXCHANGE  07/15/2019  ? IR EXT NEPHROURETERAL CATH EXCHANGE  10/14/2019  ? IR EXT  NEPHROURETERAL CATH EXCHANGE  10/14/2019  ? IR EXT NEPHROURETERAL CATH EXCHANGE  01/06/2020  ? IR EXT NEPHROURETERAL CATH EXCHANGE  03/16/2020  ? IR EXT NEPHROURETERAL CATH EXCHANGE  05/25/2020  ? IR EXT NEPHROURETERAL CATH EXCHANGE  07/17/2020  ? IR EXT NEPHROURETERAL CATH EXCHANGE  07/17/2020  ? IR EXT NEPHROURETERAL CATH EXCHANGE  08/14/2020  ? IR EXT NEPHROURETERAL CATH EXCHANGE  09/25/2020  ? IR EXT NEPHROURETERAL CATH EXCHANGE  11/06/2020  ? IR EXT NEPHROURETERAL CATH EXCHANGE  12/25/2020  ? IR EXT NEPHROURETERAL CATH EXCHANGE  02/09/2021  ? IR EXT NEPHROURETERAL CATH EXCHANGE  03/19/2021  ? IR EXT NEPHROURETERAL CATH EXCHANGE  03/19/2021  ? IR PERC TUN PERIT CATH WO PORT S&I /IMAG  03/25/2021  ? ?Social History:  reports that she has been smoking cigarettes and e-cigarettes. She has a 50.00 pack-year smoking history. She has never used smokeless tobacco. She reports that she does not currently use alcohol. She reports current drug use. Drug: Marijuana. ? ?Allergies  ?Allergen Reactions  ? Metoprolol Shortness Of Breath  ? ? ?Family History  ?Problem Relation Age of Onset  ? Heart disease Maternal Grandfather   ? ? ?Prior to Admission medications   ?Medication Sig Start Date End Date Taking? Authorizing Provider  ?acetaminophen (TYLENOL) 500 MG tablet Take 500-1,000 mg by mouth every 6 (six) hours as needed for moderate pain or fever.  [provider]  ?albuterol (VENTOLIN HFA) 108 (90 Base) MCG/ACT inhaler Inhale 1 puff into the lungs every 6 (six) hours as needed for shortness of breath. 05/22/20   [provider]  ?ARIPiprazole (ABILIFY) 30 MG tablet Take 30 mg by mouth daily. 03/19/19   [provider]  ?aspirin EC 81 MG tablet Take 81 mg by mouth daily.    [provider]  ?atorvastatin (LIPITOR) 10 MG tablet Take 10 mg by mouth at bedtime. 02/07/21   [provider]  ?B Complex-C (B-COMPLEX WITH VITAMIN C) tablet Take 1 tablet by mouth daily.    [provider]  ?busPIRone  (BUSPAR) 7.5 MG tablet Take 7.5 mg by mouth 2 (two) times daily. 02/22/21   [provider]  ?calcitRIOL (ROCALTROL) 0.25 MCG capsule Take 0.25 mcg by mouth daily. 02/12/21   [provider]  ?Cholecalciferol (VITAMIN D-3) 25 MCG (1000 UT) CAPS Take 2,000 Units by mouth daily.    [provider]  ?DULoxetine (CYMBALTA) 30 MG capsule Take 30 mg by mouth at bedtime. 06/21/20   [provider]  ?ferrous sulfate 324 MG TBEC Take 324 mg by mouth daily with breakfast.    [provider]  ?furosemide (LASIX) 40 MG tablet Take 40 mg by mouth daily.    [provider]  ?HYDROcodone-acetaminophen (NORCO/VICODIN) 5-325 MG tablet Take 1 tablet by mouth every 4 (four) hours as needed for severe pain or moderate pain. 03/25/21   Lavina Hamman, MD  ?HYDROcodone-acetaminophen (NORCO/VICODIN) 5-325 MG tablet Take 1 tablet by mouth every 8 hours as needed for pain 04/15/21     ?meropenem (MERREM) IVPB Inject 1 g into the vein every 12 (twelve) hours. Indication:  Pubic osteo ?First Dose: Yes ?Last Day of Therapy:  04/30/21 ?Labs - Once weekly:  CBC/D and BMP, ?Labs - Every other week:  ESR and CRP ?Method of administration: Mini-Bag Plus / Gravity ?Method of administration may be changed at the discretion of home infusion pharmacist based upon assessment of the patient and/or caregiver's ability to self-administer the medication ordered. 03/25/21 05/01/21  Lavina Hamman, MD  ?methocarbamol (ROBAXIN) 500 MG tablet Take 500 mg by mouth 3 (three) times daily as needed for muscle spasms. 03/08/21   [provider]  ?Multiple Vitamin (MULTIVITAMIN ADULT PO) Take 1 tablet by mouth daily.    [provider]  ?pantoprazole (PROTONIX) 40 MG tablet Take 40 mg by mouth daily. 01/28/21   [provider]  ?potassium chloride (KLOR-CON) 20 MEQ packet Take by mouth 2 (two) times daily.    [provider]  ?Probiotic Product (PROBIOTIC PO) Take 1 capsule by mouth daily.     [provider]  ?saccharomyces boulardii (FLORASTOR) 250 MG capsule Take 1 capsule (250 mg total) by mouth 2 (two) times daily. 03/25/21   Lavina Hamman, MD  ?vitamin C (ASCORBIC ACID) 500 MG tablet Take 500 mg by mouth daily.    [provider]  ? ? ?Physical Exam: ?Vitals:  ? 04/21/21 0000 04/21/21 0130 04/21/21 0200 04/21/21 0300  ?BP: (!) 119/57 129/61 (!) 119/59 116/60  ?Pulse: (!) 58 89 (!) 58 (!) 56  ?Resp: 16 17 (!) 24 16  ?Temp:      ?TempSrc:      ?SpO2: 100% 100% 99% 100%  ? ?Constitutional: NAD, calm, comfortable ?Eyes: PERRL, lids and conjunctivae normal ?ENMT: Mucous membranes are moist. Posterior pharynx clear of any exudate or lesions.Normal dentition.  ?Neck: normal, supple, no masses,  no thyromegaly ?Respiratory: clear to auscultation bilaterally, no wheezing, no crackles. Normal respiratory effort. No accessory muscle use.  ?Cardiovascular: Regular rate and rhythm, no murmurs / rubs / gallops. No extremity edema. 2+ pedal pulses. No carotid bruits.  ?Abdomen: Suprapubic and RLQ TTP, ileal conduit in place. ?Musculoskeletal: no clubbing / cyanosis. No joint deformity upper and lower extremities. Good ROM, no contractures. Normal muscle tone.  ?Skin: no rashes, lesions, ulcers. No induration ?Neurologic: CN 2-12 grossly intact. Sensation intact, DTR normal. Strength 5/5 in all 4.  ?Psychiatric: Normal judgment and insight. Alert and oriented x 3. Normal mood.  ? ?Data Reviewed: ? ?  ?K 2.8 ?Bicarb 16 (down from 30 last month) ?Creat 1.91 up from 1.7 a month ago, BUN 41 up from 25 a month ago. ?HGB 9.6 down from 11.1 in early March ?WBC nl ?CTAP = ?IMPRESSION: ?1. New subacute appearing fracture of the right pubic bone. New new ?air in the pubic symphysis which may be secondary to fracture, but ?septic arthritis is not excluded given the clinical history. ?2. Edema within the soft tissue surrounding the pubic symphysis and ?in the right iliacus muscle similar to the prior study.  No definite ?fluid collection identified allowing for lack of contrast. ?3. Subcutaneous edema in the lower anterior pelvis may be related to ?cellulitis. ?4. Questionable small amount of free fluid and

## 2021-04-21 NOTE — Progress Notes (Signed)
Pharmacy Antibiotic Note ? ?Shelia Chan is a 66 y.o. 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  Pharmacy has been consulted for merrem dosing. ? ?Plan: ?Merrem 1gm IV q12h ?Follow renal function and clinical course ? ?  ? ?Temp (24hrs), Avg:98.3 ?F (36.8 ?C), Min:98.3 ?F (36.8 ?C), Max:98.3 ?F (36.8 ?C) ? ?Recent Labs  ?Lab 04/20/21 ?2158  ?WBC 7.6  ?CREATININE 1.91*  ?  ?CrCl cannot be calculated (Unknown ideal weight.).   ? ?Allergies  ?Allergen Reactions  ? Metoprolol Shortness Of Breath  ? ? ? ?Thank you for allowing pharmacy to be a part of this patient?s care. ?Dolly Rias RPh ?04/21/2021, 3:10 AM ? ?

## 2021-04-22 DIAGNOSIS — M8618 Other acute osteomyelitis, other site: Secondary | ICD-10-CM | POA: Diagnosis not present

## 2021-04-22 DIAGNOSIS — S32591G Other specified fracture of right pubis, subsequent encounter for fracture with delayed healing: Secondary | ICD-10-CM

## 2021-04-22 DIAGNOSIS — E876 Hypokalemia: Secondary | ICD-10-CM | POA: Diagnosis not present

## 2021-04-22 DIAGNOSIS — S32509A Unspecified fracture of unspecified pubis, initial encounter for closed fracture: Secondary | ICD-10-CM | POA: Diagnosis present

## 2021-04-22 DIAGNOSIS — I5032 Chronic diastolic (congestive) heart failure: Secondary | ICD-10-CM | POA: Diagnosis not present

## 2021-04-22 DIAGNOSIS — M009 Pyogenic arthritis, unspecified: Secondary | ICD-10-CM

## 2021-04-22 DIAGNOSIS — T3695XA Adverse effect of unspecified systemic antibiotic, initial encounter: Secondary | ICD-10-CM | POA: Diagnosis not present

## 2021-04-22 DIAGNOSIS — K521 Toxic gastroenteritis and colitis: Secondary | ICD-10-CM | POA: Diagnosis not present

## 2021-04-22 LAB — CBC
HCT: 25.3 % — ABNORMAL LOW (ref 36.0–46.0)
Hemoglobin: 8.4 g/dL — ABNORMAL LOW (ref 12.0–15.0)
MCH: 28.2 pg (ref 26.0–34.0)
MCHC: 33.2 g/dL (ref 30.0–36.0)
MCV: 84.9 fL (ref 80.0–100.0)
Platelets: 505 10*3/uL — ABNORMAL HIGH (ref 150–400)
RBC: 2.98 MIL/uL — ABNORMAL LOW (ref 3.87–5.11)
RDW: 15 % (ref 11.5–15.5)
WBC: 6.8 10*3/uL (ref 4.0–10.5)
nRBC: 0 % (ref 0.0–0.2)

## 2021-04-22 LAB — BASIC METABOLIC PANEL
Anion gap: 8 (ref 5–15)
BUN: 36 mg/dL — ABNORMAL HIGH (ref 8–23)
CO2: 14 mmol/L — ABNORMAL LOW (ref 22–32)
Calcium: 8 mg/dL — ABNORMAL LOW (ref 8.9–10.3)
Chloride: 115 mmol/L — ABNORMAL HIGH (ref 98–111)
Creatinine, Ser: 1.69 mg/dL — ABNORMAL HIGH (ref 0.44–1.00)
GFR, Estimated: 33 mL/min — ABNORMAL LOW (ref >60)
Glucose, Bld: 94 mg/dL (ref 70–99)
Potassium: 3.3 mmol/L — ABNORMAL LOW (ref 3.5–5.1)
Sodium: 137 mmol/L (ref 135–145)

## 2021-04-22 MED ORDER — SODIUM CHLORIDE 0.9 % IV SOLN: INTRAVENOUS | Status: DC

## 2021-04-22 MED ORDER — POTASSIUM CHLORIDE CRYS ER 20 MEQ PO TBCR
40.0000 meq | EXTENDED_RELEASE_TABLET | Freq: Once | ORAL | Status: AC
  Administered 2021-04-22: 40 meq via ORAL
  Filled 2021-04-22: qty 2

## 2021-04-22 MED ORDER — FUROSEMIDE 20 MG PO TABS
20.0000 mg | ORAL_TABLET | Freq: Every day | ORAL | Status: DC
  Administered 2021-04-23 – 2021-04-24 (×2): 20 mg via ORAL
  Filled 2021-04-22 (×3): qty 1

## 2021-04-22 NOTE — Assessment & Plan Note (Addendum)
-  Resolved ?Monitoring ?-No significant bleeding only spotting reported per patient ?- GYN consulted, status post evaluation, negative for any findings ?GYN not impressed..  Likely due to inflammation, due to infection, \ chronic osteomyelitis and pelvic fracture ? ?-CT and MRI abdomen pelvis and did not reveal any specific pathology  ?

## 2021-04-22 NOTE — Consult Note (Signed)
? ?Impression: ?Vaginal spotting ?On exam she has a normal introitus the vaginal vault is without mass ?There is significant vaginal atrophy ?Along the anterior vagina up just behind the pubic bone there is some nodularity most likely related to her pubic fracture and is very likely contributing to the spotting that she is seeing. ? ?Pubic bone osteomyelitis bilaterally with myositis ?Per ID ? ?Pubic bone fracture ?Per Ortho ? ?Chronic kidney disease stage IV ?Per primary team ? ?History of squamous cell carcinoma of the urethra ?No evidence of recurrence ? ?History of CHF ?Appears euvolemic ? ?Recommendations: ?Monitor spotting ?Does not seem to be enough to be solely responsible for drop in hemoglobin ?If worsens we can certainly follow her up as an outpatient ?Though I suspect that this fracture improves in her vagina is allowed to heal her spotting will go away as well. ? ? ?Reason for consult: ?Patient is a 66 y.o. No obstetric history on file. female who was admitted with osteomyelitis and pubic bone fracture.  We are asked to see the patient regarding vaginal spotting that began just prior to admission.  She is status post TAH in [place] in the 1990s for benign reasons.  Since that time she has not had any reason for nor has she seen any bleeding.  Patient history of C-section x2.  Patient has a past medical history also significant for urethral cancer she currently has a urostomy. ? ?Past Medical History:  ?Diagnosis Date  ? Arthritis   ? Asthma   ? Blood transfusion without reported diagnosis   ? Cancer [facility])   ? CHF (congestive heart failure) ([place])   ? COPD (chronic obstructive pulmonary disease) ([place])   ? Heart murmur   ? Immune deficiency disorder ([place])   ? Renal disorder   ? ? ?Past Surgical History:  ?Procedure Laterality Date  ? BLADDER REMOVAL  118/22/2015  ? FRACTURE SURGERY Left 02/2018  ? pins  ? IR CATHETER TUBE CHANGE  04/15/2019  ? IR CATHETER TUBE CHANGE  04/15/2019  ? IR EXT NEPHROURETERAL  CATH EXCHANGE  07/15/2019  ? IR EXT NEPHROURETERAL CATH EXCHANGE  10/14/2019  ? IR EXT NEPHROURETERAL CATH EXCHANGE  10/14/2019  ? IR EXT NEPHROURETERAL CATH EXCHANGE  01/06/2020  ? IR EXT NEPHROURETERAL CATH EXCHANGE  03/16/2020  ? IR EXT NEPHROURETERAL CATH EXCHANGE  05/25/2020  ? IR EXT NEPHROURETERAL CATH EXCHANGE  07/17/2020  ? IR EXT NEPHROURETERAL CATH EXCHANGE  07/17/2020  ? IR EXT NEPHROURETERAL CATH EXCHANGE  08/14/2020  ? IR EXT NEPHROURETERAL CATH EXCHANGE  09/25/2020  ? IR EXT NEPHROURETERAL CATH EXCHANGE  11/06/2020  ? IR EXT NEPHROURETERAL CATH EXCHANGE  12/25/2020  ? IR EXT NEPHROURETERAL CATH EXCHANGE  02/09/2021  ? IR EXT NEPHROURETERAL CATH EXCHANGE  03/19/2021  ? IR EXT NEPHROURETERAL CATH EXCHANGE  03/19/2021  ? IR PERC TUN PERIT CATH WO PORT S&I /IMAG  03/25/2021  ? ? ?Family History  ?Problem Relation Age of Onset  ? Heart disease Maternal Grandfather   ? ? ?Social History  ? ?Socioeconomic History  ? Marital status: Divorced  ?  Spouse name: Not on file  ? Number of children: Not on file  ? Years of education: Not on file  ? Highest education level: Not on file  ?Occupational History  ? Occupation: disabled  ?Tobacco Use  ? Smoking status: Some Days  ?  Packs/day: 1.00  ?  Years: 50.00  ?  Pack years: 50.00  ?  Types: Cigarettes, E-cigarettes  ?  Last attempt to quit: 01/21/2019  ?  Years since quitting: 2.2  ? Smokeless tobacco: Never  ? Tobacco comments:  ?  Quit smoking but vapes occasionally  ?Vaping Use  ? Vaping Use: Never used  ?Substance and Sexual Activity  ? Alcohol use: Not Currently  ? Drug use: Yes  ?  Types: Marijuana  ?  Comment: occasional   ? Sexual activity: Not on file  ?Other Topics Concern  ? Not on file  ?Social History Narrative  ? Not on file  ? ?Social Determinants of Health  ? ?Financial Resource Strain: Not on file  ?Food Insecurity: Not on file  ?Transportation Needs: Not on file  ?Physical Activity: Not on file  ?Stress: Not on file  ?Social Connections: Not on file  ?Intimate Partner  Violence: Not on file  ? ? ? acidophilus  1 capsule Oral Daily  ? ARIPiprazole  30 mg Oral Daily  ? aspirin EC  81 mg Oral Daily  ? atorvastatin  10 mg Oral QHS  ? calcitRIOL  0.25 mcg Oral Daily  ? Chlorhexidine Gluconate Cloth  6 each Topical Daily  ? cholecalciferol  2,000 Units Oral Daily  ? DULoxetine  30 mg Oral QHS  ? ferrous sulfate  325 mg Oral Q breakfast  ? [START ON 04/23/2021] furosemide  20 mg Oral Daily  ? multivitamin with minerals   Oral Daily  ? sodium chloride flush  10-40 mL Intracatheter Q12H  ? ? ?Allergies  ?Allergen Reactions  ? Metoprolol Shortness Of Breath  ? ? ?Review of Systems - Negative except as per HPI ? ?Exam ?Vitals:  ? 04/21/21 2012 04/22/21 0306  ?BP: 112/65 115/71  ?Pulse: 67 63  ?Resp: 18 16  ?Temp: 98.8 ?F (37.1 ?C) 98.2 ?F (36.8 ?C)  ?SpO2: 98% 100%  ? ? ?Physical Examination: General appearance - alert, well appearing, and in no distress ?Neck - supple, no significant adenopathy ?Chest -normal effort ?Heart - normal rate and regular rhythm ?Abdomen - soft, nontender, nondistended, no masses or organomegaly ?Pelvic -atrophic vaginal atrophy, urethral opening is obliterated, patient declined speculum exam, digital vaginal exam revealed normal vault and posterior vagina anteriorly along the pubic bone there was some roughened tissue consistent with pubic fracture ?Neurological - alert, oriented, normal speech, no focal findings or movement disorder noted ?Extremities -bilateral edema ? ?Labs: ? ?CBC ?   ?Component Value Date/Time  ? WBC 6.8 04/22/2021 0328  ? RBC 2.98 (L) 04/22/2021 0328  ? HGB 8.4 (L) 04/22/2021 0328  ? HCT 25.3 (L) 04/22/2021 0328  ? PLT 505 (H) 04/22/2021 0328  ? MCV 84.9 04/22/2021 0328  ? MCH 28.2 04/22/2021 0328  ? MCHC 33.2 04/22/2021 0328  ? RDW 15.0 04/22/2021 0328  ? LYMPHSABS 1.7 04/20/2021 2158  ? MONOABS 0.8 04/20/2021 2158  ? EOSABS 0.1 04/20/2021 2158  ? BASOSABS 0.1 04/20/2021 2158  ? ? ?CMP  ?   ?Component Value Date/Time  ? NA 137 04/22/2021  0328  ? K 3.3 (L) 04/22/2021 0328  ? CL 115 (H) 04/22/2021 0328  ? CO2 14 (L) 04/22/2021 0328  ? GLUCOSE 94 04/22/2021 0328  ? BUN 36 (H) 04/22/2021 0328  ? CREATININE 1.69 (H) 04/22/2021 0328  ? CALCIUM 8.0 (L) 04/22/2021 0328  ? PROT 7.2 04/20/2021 2158  ? ALBUMIN 2.8 (L) 04/20/2021 2158  ? AST 18 04/20/2021 2158  ? ALT 14 04/20/2021 2158  ? ALKPHOS 156 (H) 04/20/2021 2158  ? BILITOT 0.1 (L) 04/20/2021 2158  ?  GFRNONAA 33 (L) 04/22/2021 0328  ? GFRAA 26 (L) 08/19/2019 1655  ? ? ?Lab Results  ?Component Value Date  ? TSH 2.600 06/03/2019  ? ? ? ?Radiological Studies ?CT Abdomen Pelvis Wo Contrast ? ?Result Date: 04/20/2021 ?CLINICAL DATA:  Osteomyelitis of pelvis, vaginal bleeding. EXAM: CT ABDOMEN AND PELVIS WITHOUT CONTRAST TECHNIQUE: Multidetector CT imaging of the abdomen and pelvis was performed following the standard protocol without IV contrast. RADIATION DOSE REDUCTION: This exam was performed according to the departmental dose-optimization program which includes automated exposure control, adjustment of the mA and/or kV according to patient size and/or use of iterative reconstruction technique. COMPARISON:  MRI pelvis 07/14/2021. CT abdomen and pelvis 03/14/2021. FINDINGS: Lower chest: No acute abnormality. Hepatobiliary: Gallbladder not definitely seen. No biliary ductal dilatation. Liver within normal limits. Pancreas: Unremarkable. No pancreatic ductal dilatation or surrounding inflammatory changes. Spleen: Normal in size without focal abnormality. Adrenals/Urinary Tract: Adrenal glands are within normal limits. Left renal atrophy is unchanged. There is new moderate severe right-sided hydronephrosis. Bilateral ureteral stents appear in adequate position ending and diverting urostomy. No perinephric stranding. Bladder is absent. Stomach/Bowel: Air-fluid levels are seen throughout the colon. Small bowel and stomach are unremarkable. No pneumatosis or focal inflammation. Appendix is not visualized. Stomach  is nondilated. Multiple surgical anastomoses are seen within the pelvis. Vascular/Lymphatic: Aortic atherosclerosis. No enlarged abdominal or pelvic lymph nodes. Reproductive: Uterus and ovaries are not visua

## 2021-04-22 NOTE — Progress Notes (Signed)
?PROGRESS NOTE ? ? ? ?Patient: Shelia Chan                            PCP: Shelia Nova, MD                    ?DOB: [birth year]            DOA: 04/20/2021 ?QQI:[id number]             DOS: 04/22/2021, 11:07 AM ? ? LOS: 1 day  ? ?Date of Service: The patient was seen and examined on 04/22/2021 ? ?Subjective:  ? ?The patient was seen and examined this morning, stable no acute distress. ?Reporting of no further bleeding vaginally. ?Stating she was examined by OB/GYN yesterday, OB/GYN was not impressed with any overt bleeding. ? ?Still complaining of a suprapubic pain pelvic pain, otherwise hemodynamically stable ? ? ?Brief Narrative:  ? ? ? ?Shelia Chan is a 66 y.o. female with medical history significant of uretheral CA s/p bladder resection, ileal conduit, CKD 3-4, dCHF. ?  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  Pubic bone biopsy for cultures NGTD.  Pt ultimately put on 6 weeks of Merrem per ID. ?  ?She has diarrhea since last hospital stay, constant, unchanged, not worsening. May be due to Believed to be due to ABx. ?  ?She reports  onset of Right pelvic pain a few days after discharge from last admit. ?  ?Presents to ED with c/o new onset vaginal bleeding, described as "spotting" for the past 4 days.  Symptoms constant and ongoing.  Pt has had hysterectomy in past. ?  ?Due to anatomy from various surgeries, pt was unable to tolerate EDP attempting to do a speculum exam. ? No fever, chills, N/V. 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 ? ?Admission: For concern of ongoing osteomyelitis of pubic bone, subacute fracture of pubic bone..  Likely evaluation from ID and urology possible GYN ? ?ED:  ?Vitals; BP 119/57, pulse 58, RR 16, satting 100% room air, temp 97.4 ?K 2.8,  ?Bicarb 16 (down from 30 last month) ?Creat 1.91 up from 1.7 a month ago, BUN 41 up from 25 a month ago. ?HGB 9.6 down from  11.1 in early March ?WBC: 7.6 ? ?CTAP = IMPRESSION: ?1. New subacute appearing fracture of the right pubic bone. New new ?air in the pubic symphysis which may be secondary to fracture, but ?septic arthritis is not excluded given the clinical history. ?2. Edema within the soft tissue surrounding the pubic symphysis and ?in the right iliacus muscle similar to the prior study. No definite ?fluid collection identified allowing for lack of contrast. ?3. Subcutaneous edema in the lower anterior pelvis may be related to ?cellulitis. ?4. Questionable small amount of free fluid and free air in the lower ?left pelvis, extraluminal. Abscess can not be excluded in this ?region. Bowel perforation can also not be excluded in this region. ?5. New moderate severe right-sided hydronephrosis. Bilateral ?ureteral stents remain in adequate position. ?6. Air-fluid levels throughout the colon can be seen in the setting ?of diarrhea. ? ? ? ?Assessment & Plan:  ? ?Principal Problem: ?  Pubic bone Osteomyelitis, bilaterally, with myositis ?Active Problems: ?  Pubic bone fracture (HCC) ?  Hydronephrosis of right kidney ?  Vaginal discharge ?  CKD (chronic kidney  disease), stage IV ([place]) ?  Chronic diastolic CHF (congestive heart failure) ([place]) ?  Squamous cell carcinoma of the urethra ?  Antibiotic-associated diarrhea ?  Hypokalemia ?  Vaginal bleeding ? ? ? ? ?Assessment and Plan: ?* Pubic bone Osteomyelitis, bilaterally, with myositis ?Continue to complain of tenderness, uncomfortable ?Pt remains on merrem at this time. ?Given new subacute fx of R pubic bone: ?Ordered repeat MRI of pelvis to evaluate ?ESR 46 (mildly elevated) ?CRP 4.6 (mildly elevated) ?Infectious disease team consulted--- appreciate input -current recommendation is to continue meropenem ? ?No signs of SIRS or sepsis ? ?Will continue merrem ?Orthopedic team has been consulted for evaluation recommendation ?Concern for eroding infection through the bone causing  fracture ?MRI of pelvic: Reviewed-reported ?Continued substantial pubic osteomyelitis/septic joint, including ?a fracture of the right pubic body. The amount of associated edema ?is increased compared to 03/16/2021, and there is substantial ?regional myositis along the hip adductor musculature and right ?obturator internus. ? ?Pubic bone fracture ([place]) ?- Imaging including MRI of the pelvic was reviewed: ?Continued substantial pubic osteomyelitis/septic joint, including ?a fracture of the right pubic body. The amount of associated edema ?is increased compared to 03/16/2021, and there is substantial ?regional myositis along the hip adductor musculature and right ?obturator internus. ? ?-Orthopedic team follow Ortho care doctor next has been notified ?Seen on last admission by Dr. Ninfa Chan on 03/17/2021 ?Appreciate their location and input ? ?Vaginal discharge ?Vaginal bleeding, "spotting".  Not large volume bleed. ?-S/p hysterectomy previously--patient reports it was many years ago, in [place] ? ?HGB down slightly. ?No blood thinners ?Monitor CBC ?EDP attempted speculum exam but pt unable to tolerate. ?Consulted OB/GYN for further evaluation recommendation -reporting of no specific findings..  ? ?However wondering if this is related to the pubic bone osteomyelitis / fracture, and not OB in nature? ?History of hysterectomy, no further finding specified to vaginal/uterine with exception of inflammatory process ? ?Hydronephrosis of right kidney ?-severe hydro of R kidney despite good positioning of bilateral ureteral stents according to CT scan. ?Consulted urologist-discussed the case in detail, recommended consulting IR for stent exchange. ?Urologist was not concerned about chronic hydronephrosis ? ?Urostomy functional,  in place, clear urine noted, patient afebrile, normotensive with no leukocytosis ? ?Hypokalemia ?Repleting with p.o. potassium and magnesium ?Potassium 2.8, 3.0 >>>3.3 ?Magnesium 1.2 >>>  ?Additional  potassium and magnesium will be given ? ?Antibiotic-associated diarrhea ?This ongoing for several weeks. ?Unchanged today. ?No WBC nor fever. ? ID was not impressed for C.Diff . ?Holding off on ordering any C.Diff testing. ?No signs of overt infection such as SIRS,-leukocytosis, afebrile, normotensive ? ?Squamous cell carcinoma of the urethra ?S/p surgery ureteral bladder resection ?Urostomy in place, clean urine output noted ?Currently believed to be in long term remission as of 2016. ? ?-With chronic hydronephrosis and stent which is changed routinely by IR ?-Urologist was consulted, recommended no further work-up ? ?Chronic diastolic CHF (congestive heart failure) ([place]) ?-Med rec reviewed ?Holding lasix for the moment. ? ?CKD (chronic kidney disease), stage IV ([place]) ?Chronic and stable, slightly worse than baseline today. ?See hydronephrosis above. ?-Monitoring BUN/creatinine closely ?Creatinine 1.91>> 1.62 >>1.96 ? ? ?Vaginal bleeding ?-No significant bleeding only spotting reported per patient ?- GYN consulted, status post evaluation, negative for any findings ?-CT and MRI abdomen pelvis and revealing any specific pathology ?May be related to inflammatory process going with current on chronic osteomyelitis and pelvic fracture ? ? ? ? ?Consult: ID/urology/orthopedic/IR/GYN ?Appreciate everyone's effort and input in this case ? ? ? ? ? ? ?----------------------------------------------------------------------------------------------------------------------------------------------- ?  Nutritional status:  ?The patient's BMI is: Body mass index is 24.95 kg/m?. ?I agree with the assessment and plan as outlined ------------------------------------------------------------------------------------------------------------------------- ?Cultures; ?Blood Cultures x 2 >> NGT ?Urine Culture  >>> NGT  ? ? ?----------------------------------------------------------------------------------------------------------------- ? ?DVT  prophylaxis:  ?SCDs Start: 04/21/21 0259 ? ? ?Code Status:   Code Status: DNR ? ?Family Communication: No family member present at bedside- attempt will be made to update daily ?The above findings and plan of care has

## 2021-04-22 NOTE — Clinical Social Work Note (Signed)
?  Transition of Care (TOC) Screening Note ? ? ?Patient Details  ?Name: Shelia Chan ?Date of Birth: [birth year] ? ? ?Transition of Care (TOC) CM/SW Contact:    ?Trish Mage, LCSW ?Phone Number: ?04/22/2021, 10:45 AM ? ? ? ?Transition of Care Department [facility]) has reviewed patient and no TOC needs have been identified at this time. We will continue to monitor patient advancement through interdisciplinary progression rounds. If new patient transition needs arise, please place a TOC consult. ? ?[name] is already open to [place] for [facility] RN services.  May need PT as well. TBD. ? ? ?

## 2021-04-22 NOTE — Progress Notes (Addendum)
? ?[facility] Infectious Diseases Follow Up Note ? ?Patient Identification: ?Patient Name: Shelia Chan MRN: [id number] [place] Date: 04/20/2021  7:47 PM ?Age: 66 y.o.Today's Date: 04/22/2021 ? ?Reason for Visit: septic arthritis and osteomyelitis  ? ?Principal Problem: ?  Pubic bone Osteomyelitis, bilaterally, with myositis ?Active Problems: ?  CKD (chronic kidney disease), stage IV ([place]) ?  Hydronephrosis of right kidney ?  Chronic diastolic CHF (congestive heart failure) ([place]) ?  Vaginal discharge ?  Squamous cell carcinoma of the urethra ?  Antibiotic-associated diarrhea ?  Hypokalemia ?  Vaginal bleeding ?  Pubic bone fracture (HCC) ? ? ?Antibiotics:  ?Meropenem prior to admit- c ? ?Lines/Hardware: RT neck CVC, RLQ urostomy, left and rt ureteral stent ? ?Interval Events: continues to be afebrile, no leukocytosis  ? ?Assessment ?# Subacute Fracture of rt pubic bone  ?# Bilateral pubic bone osteomyelitis +septic arthritis and myositis ( path + for acute osteomyelitis, Cx negative to date) with h/o ESBL E coli and PsA - on meropenem with EOT 04/30/21 ?  ?# New moderate severe rt sided hydronephrosis, h/o bilateral ureteral stents- Cr is improving, planned for IR stent exchange ?  ?# Antibiotic induced diarrhea - has had diarrhea since being on abtx, multiple episodes but no abdominal cramps, fevers or leukocytosis, no foul smell, will hold off on C diff testing at this time  ? ?# AKI on CKD- improving  ?  ?# Vaginal spotting- has h/o hysterectomy. Seen by Gyn- likely related to pubic fracture and plan to fu OP ?  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 ?Recommendations ?Continue meropenem for now  ?Fu Ortho eval for possible intervention ?Monitor diarrhea for worsening/need to check for C diff ?Monitor CBC, BMP  ?Dr Linus Salmons to follow tomorrow  ? ?Rest of the management as per the  primary team. ?Thank you for the consult. Please page with pertinent questions or concerns. ? ?______________________________________________________________________ ?Subjective ?patient seen and examined at the bedside.  ?Continues to have pain at the rt hip/rt lower abdomen and diarrhea  ? ?Vitals ?BP 104/71 (BP Location: Left Arm)   Pulse (!) 58   Temp 98.2 ?F (36.8 ?C) (Oral)   Resp 18   Ht '5\' 1"'$  (1.549 m)   Wt 59.9 kg   SpO2 99%   BMI 24.95 kg/m?  ? ?  ?Physical Exam ?Constitutional:  sitting up in the bed and appears comfortable  ?   Comments:  ? ?Cardiovascular:  ?   Rate and Rhythm: Normal rate and regular rhythm.  ?   Heart sounds:  ? ?Pulmonary:  ?   Effort: Pulmonary effort is normal on room air  ?   Comments:  ? ?Abdominal:  ?   Palpations: Abdomen is soft.  ?   Tenderness: tender in the RLQ, no RT and guarding, Rt LQ urostomy+ ? ?Musculoskeletal:     ?   General: No swelling or tenderness.  ? ?Skin: ?   Comments: Rt chest tunneled catheter + OK ? ?Neurological:  ?   General: Grossly non focal, awake, alert and oriented  ? ?Psychiatric:     ?   Mood and Affect: Mood normal.  ? ?Pertinent Microbiology ?Results for orders placed or performed during the hospital encounter of 03/13/21  ?Blood culture (routine x 2)     Status: None  ? Collection Time: 03/13/21 11:23 PM  ? Specimen: BLOOD  ?Result Value Ref Range Status  ? Specimen  Description   Final  ?  BLOOD LEFT ANTECUBITAL ?Performed at [facility], [place] [address], [place], [place] [address] ?  ? Special Requests   Final  ?  BOTTLES DRAWN AEROBIC ONLY Blood Culture adequate volume ?Performed at [facility], [place] [address] [place], [place] [address] ?  ? Culture   Final  ?  NO GROWTH 5 DAYS ?Performed at [facility], [place] [address], [place], [place] [address] ?  ? Report Status 03/19/2021 FINAL  Final  ?Blood culture (routine x 2)     Status: None  ? Collection Time: 03/13/21 11:46 PM  ? Specimen:  BLOOD  ?Result Value Ref Range Status  ? Specimen Description   Final  ?  BLOOD BLOOD RIGHT FOREARM ?Performed at [facility], [place] [address] [place], [place] [address] ?  ? Special Requests   Final  ?  BOTTLES DRAWN AEROBIC AND ANAEROBIC Blood Culture adequate volume ?Performed at [facility], [place] [address], [place], [place] [address] ?  ? Culture   Final  ?  NO GROWTH 5 DAYS ?Performed at [facility], [place] [address] [place], [place] [address] ?  ? Report Status 03/19/2021 FINAL  Final  ?Urine Culture     Status: Abnormal  ? Collection Time: 03/14/21  2:09 AM  ? Specimen: Urine, Catheterized  ?Result Value Ref Range Status  ? Specimen Description   Final  ?  URINE, CATHETERIZED ?Performed at [facility], [place] [address] [place], [place] [address] ?  ? Special Requests   Final  ?  NONE ?Performed at [facility], [place] [address], [place], [place] [address] ?  ? Culture (A)  Final  ?  >=100,000 COLONIES/mL ESCHERICHIA COLI ?60,000 COLONIES/mL PSEUDOMONAS AERUGINOSA ?Confirmed Extended Spectrum Beta-Lactamase Producer (ESBL).  In bloodstream infections from ESBL organisms, carbapenems are preferred over piperacillin/tazobactam. They are shown to have a lower risk of mortality. ?  ? Report Status 03/16/2021 FINAL  Final  ? Organism ID, Bacteria ESCHERICHIA COLI (A)  Final  ? Organism ID, Bacteria PSEUDOMONAS AERUGINOSA (A)  Final  ?    Susceptibility  ? Escherichia coli - MIC*  ?  AMPICILLIN >=32 RESISTANT Resistant   ?  CEFAZOLIN >=64 RESISTANT Resistant   ?  CEFEPIME 16 RESISTANT Resistant   ?  CEFTRIAXONE >=64 RESISTANT Resistant   ?  CIPROFLOXACIN >=4 RESISTANT Resistant   ?  GENTAMICIN <=1 SENSITIVE Sensitive   ?  IMIPENEM <=0.25 SENSITIVE Sensitive   ?  NITROFURANTOIN <=16 SENSITIVE Sensitive   ?  TRIMETH/SULFA >=320 RESISTANT Resistant   ?  AMPICILLIN/SULBACTAM 8 SENSITIVE Sensitive   ?  PIP/TAZO <=4 SENSITIVE Sensitive   ?   * >=100,000 COLONIES/mL ESCHERICHIA COLI  ? Pseudomonas aeruginosa - MIC*  ?  CEFTAZIDIME 4 SENSITIVE Sensitive   ?  CIPROFLOXACIN 0.5 SENSITIVE Sensitive   ?  GENTAMICIN <=1 SENSITIVE Sensitive   ?  IMIPENEM 2 SENSITIVE Sensitive   ?  PIP/TAZO <=4 SENSITIVE Sensitive   ?  CEFEPIME 2 SENSITIVE Sensitive   ?  * 60,000 COLONIES/mL PSEUDOMONAS AERUGINOSA  ?Resp Panel by RT-PCR (Flu A&B, Covid) Nasopharyngeal Swab     Status: None  ? Collection Time: 03/14/21  2:09 AM  ? Specimen: Nasopharyngeal Swab; Nasopharyngeal(NP) swabs in vial transport medium  ?Result Value Ref Range Status  ? SARS Coronavirus 2 by RT PCR NEGATIVE NEGATIVE Final  ?  Comment: (NOTE) ?SARS-CoV-2 target  nucleic acids are NOT DETECTED. ? ?The SARS-CoV-2 RNA is generally detectable in upper respiratory ?specimens during the acute phase of infection. The lowest ?concentration of SARS-CoV-2 viral copies this assay can detect is ?138 copies/mL. A negative result does not preclude SARS-Cov-2 ?infection and should not be used as the sole basis for treatment or ?other patient management decisions. A negative result may occur with  ?improper specimen collection/handling, submission of specimen other ?than nasopharyngeal swab, presence of viral mutation(s) within the ?areas targeted by this assay, and inadequate number of viral ?copies(<138 copies/mL). A negative result must be combined with ?clinical observations, patient history, and epidemiological ?information. The expected result is Negative. ? ?Fact Sheet for Patients:  ?EntrepreneurPulse.com.au ? ?Fact Sheet for Healthcare Providers:  ?IncredibleEmployment.be ? ?This test is no t yet approved or cleared by the [place] FDA and  ?has been authorized for detection and/or diagnosis of SARS-CoV-2 by ?FDA under an Emergency Use Authorization (EUA). This EUA will remain  ?in effect (meaning this test can be used) for the duration of the ?COVID-19 declaration under  Section 564(b)(1) of the Act, 21 ?U.S.C.section 360bbb-3(b)(1), unless the authorization is terminated  ?or revoked sooner.  ? ? ?  ? Influenza A by PCR NEGATIVE NEGATIVE Final  ? Influenza B by PCR NEGATIVE NEGATIVE Fi

## 2021-04-22 NOTE — Assessment & Plan Note (Addendum)
-   Imaging including MRI of the pelvic was reviewed: ?Continued substantial pubic osteomyelitis/septic joint, including ?a fracture of the right pubic body. The amount of associated edema ?is increased compared to 03/16/2021, and there is substantial ?regional myositis along the hip adductor musculature and right ?obturator internus. ? ?-Orthopedic team follow Ortho care doctor next has been notified ?Seen on last admission by Dr. Ninfa Linden on 03/17/2021 ? ?-Seen by Orthopedic Surgery. Given location of fracture, will need to be seen at tertiary care center, but no emergent or urgent need for surgery at this time, per Ortho ? ?-Recommend referral to Ortho at tertiary care center ?

## 2021-04-22 NOTE — Progress Notes (Signed)
Urology Inpatient Progress Report ? ? ? ?  ? ? ?Intv/Subj: ?Mild right back pain. Stents have not been changed yet.  ? ?Principal Problem: ?  Pubic bone Osteomyelitis, bilaterally, with myositis ?Active Problems: ?  CKD (chronic kidney disease), stage IV ([place]) ?  Hydronephrosis of right kidney ?  Chronic diastolic CHF (congestive heart failure) ([place]) ?  Vaginal discharge ?  Squamous cell carcinoma of the urethra ?  Antibiotic-associated diarrhea ?  Hypokalemia ?  Vaginal bleeding ?  Pubic bone fracture (HCC) ?  Septic arthritis ([place]) ? ?Current Facility-Administered Medications  ?Medication Dose Route Frequency Provider Last Rate Last Admin  ? 0.9 %  sodium chloride infusion   Intravenous Continuous Skipper Cliche A, MD 75 mL/hr at 04/22/21 1059 New Bag at 04/22/21 1059  ? acetaminophen (TYLENOL) tablet 650 mg  650 mg Oral Q6H PRN Etta Quill, DO      ? Or  ? acetaminophen (TYLENOL) suppository 650 mg  650 mg Rectal Q6H PRN Etta Quill, DO      ? acidophilus (RISAQUAD) capsule 1 capsule  1 capsule Oral Daily Shahmehdi, Seyed A, MD   1 capsule at 04/22/21 1046  ? ARIPiprazole (ABILIFY) tablet 30 mg  30 mg Oral Daily Shahmehdi, Seyed A, MD   30 mg at 04/22/21 1044  ? aspirin EC tablet 81 mg  81 mg Oral Daily Shahmehdi, Seyed A, MD   81 mg at 04/22/21 1045  ? atorvastatin (LIPITOR) tablet 10 mg  10 mg Oral QHS Shahmehdi, Seyed A, MD   10 mg at 04/21/21 2106  ? calcitRIOL (ROCALTROL) capsule 0.25 mcg  0.25 mcg Oral Daily Shahmehdi, Seyed A, MD   0.25 mcg at 04/22/21 1046  ? Chlorhexidine Gluconate Cloth 2 % PADS 6 each  6 each Topical Daily Etta Quill, DO   6 each at 04/22/21 1046  ? cholecalciferol (VITAMIN D) tablet 2,000 Units  2,000 Units Oral Daily Skipper Cliche A, MD   2,000 Units at 04/22/21 1045  ? DULoxetine (CYMBALTA) DR capsule 30 mg  30 mg Oral QHS Shahmehdi, Seyed A, MD   30 mg at 04/21/21 2106  ? ferrous sulfate tablet 325 mg  325 mg Oral Q breakfast Shahmehdi, Seyed A, MD   325 mg at  04/22/21 0900  ? [START ON 04/23/2021] furosemide (LASIX) tablet 20 mg  20 mg Oral Daily Shahmehdi, Seyed A, MD      ? HYDROcodone-acetaminophen (NORCO/VICODIN) 5-325 MG per tablet 1 tablet  1 tablet Oral Q6H PRN Etta Quill, DO   1 tablet at 04/21/21 2111  ? meropenem (MERREM) 1 g in sodium chloride 0.9 % 100 mL IVPB  1 g Intravenous Q12H Angela Adam, RPH 200 mL/hr at 04/22/21 0342 1 g at 04/22/21 0342  ? methocarbamol (ROBAXIN) tablet 500 mg  500 mg Oral TID PRN Skipper Cliche A, MD   500 mg at 04/21/21 1452  ? multivitamin with minerals tablet   Oral Daily Skipper Cliche A, MD   1 tablet at 04/22/21 1045  ? ondansetron (ZOFRAN) tablet 4 mg  4 mg Oral Q6H PRN Etta Quill, DO      ? Or  ? ondansetron (ZOFRAN) injection 4 mg  4 mg Intravenous Q6H PRN Etta Quill, DO      ? sodium chloride flush (NS) 0.9 % injection 10-40 mL  10-40 mL Intracatheter Q12H Etta Quill, DO   10 mL at 04/22/21 1046  ? sodium chloride flush (NS) 0.9 %  injection 10-40 mL  10-40 mL Intracatheter PRN Etta Quill, DO      ? ? ? ?Objective: ?Vital: ?Vitals:  ? 04/21/21 1653 04/21/21 2012 04/22/21 0306 04/22/21 1303  ?BP: (!) 104/57 112/65 115/71 104/71  ?Pulse: 65 67 63 (!) 58  ?Resp: '18 18 16 18  '$ ?Temp: 98.7 ?F (37.1 ?C) 98.8 ?F (37.1 ?C) 98.2 ?F (36.8 ?C) 98.2 ?F (36.8 ?C)  ?TempSrc: Oral  Oral Oral  ?SpO2: 98% 98% 100% 99%  ?Weight:      ?Height:      ? ?I/Os: ?I/O last 3 completed shifts: ?In: 1569.4 [P.O.:840; IV Piggyback:729.4] ?Out: 400 [Urine:400] ? ?Physical Exam:  ?General: Patient is in no apparent distress ?Lungs: Normal respiratory effort, chest expands symmetrically. ?GI: ileal conduit with stents emanating draining into ostomy bag, mod suprapubic ttp, mild right flank ttp ?Ext: lower extremities symmetric ? ?Lab Results: ?Recent Labs  ?  04/20/21 ?2158 04/21/21 ?7948 04/22/21 ?0328  ?WBC 7.6 6.6 6.8  ?HGB 9.6* 8.5* 8.4*  ?HCT 28.2* 25.5* 25.3*  ? ?Recent Labs  ?  04/20/21 ?2158 04/21/21 ?0165  04/22/21 ?0328  ?NA 136 137 137  ?K 2.8* 3.0* 3.3*  ?CL 113* 114* 115*  ?CO2 16* 15* 14*  ?GLUCOSE 106* 113* 94  ?BUN 41* 40* 36*  ?CREATININE 1.91* 1.62* 1.69*  ?CALCIUM 7.6* 7.4* 8.0*  ? ?Recent Labs  ?  04/20/21 ?2158  ?INR 1.2  ? ?No results for input(s): LABURIN in the last 72 hours. ?Results for orders placed or performed during the hospital encounter of 03/13/21  ?Blood culture (routine x 2)     Status: None  ? Collection Time: 03/13/21 11:23 PM  ? Specimen: BLOOD  ?Result Value Ref Range Status  ? Specimen Description   Final  ?  BLOOD LEFT ANTECUBITAL ?Performed at [facility], [place] [address] [place], [place] [address] ?  ? Special Requests   Final  ?  BOTTLES DRAWN AEROBIC ONLY Blood Culture adequate volume ?Performed at [facility], [place] [address], [place], [place] [address] ?  ? Culture   Final  ?  NO GROWTH 5 DAYS ?Performed at [facility], [place] [address], [place], [place] [address] ?  ? Report Status 03/19/2021 FINAL  Final  ?Blood culture (routine x 2)     Status: None  ? Collection Time: 03/13/21 11:46 PM  ? Specimen: BLOOD  ?Result Value Ref Range Status  ? Specimen Description   Final  ?  BLOOD BLOOD RIGHT FOREARM ?Performed at [facility], [place] [address], [place], [place] [address] ?  ? Special Requests   Final  ?  BOTTLES DRAWN AEROBIC AND ANAEROBIC Blood Culture adequate volume ?Performed at [facility], [place] [address], [place], [place] [address] ?  ? Culture   Final  ?  NO GROWTH 5 DAYS ?Performed at [facility], [place] [address], [place], [place] [address] ?  ? Report Status 03/19/2021 FINAL  Final  ?Urine Culture     Status: Abnormal  ? Collection Time: 03/14/21  2:09 AM  ? Specimen: Urine, Catheterized  ?Result Value Ref Range Status  ? Specimen Description   Final  ?  URINE, CATHETERIZED ?Performed at [facility], [place] [address], [place], [place] [address] ?  ? Special Requests    Final  ?  NONE ?Performed at [facility], [place] [address], [place],  [address] ?  ? Culture (  A)  Final  ?  >=100,000 COLONIES/mL ESCHERICHIA COLI ?60,000 COLONIES/mL PSEUDOMONAS AERUGINOSA ?Confirmed Extended Spectrum Beta-Lactamase Producer (ESBL).  In bloodstream infections from ESBL organisms, carbapenems are preferred over piperacillin/tazobactam. They are shown to have a lower risk of mortality. ?  ? Report Status 03/16/2021 FINAL  Final  ? Organism ID, Bacteria ESCHERICHIA COLI (A)  Final  ? Organism ID, Bacteria PSEUDOMONAS AERUGINOSA (A)  Final  ?    Susceptibility  ? Escherichia coli - MIC*  ?  AMPICILLIN >=32 RESISTANT Resistant   ?  CEFAZOLIN >=64 RESISTANT Resistant   ?  CEFEPIME 16 RESISTANT Resistant   ?  CEFTRIAXONE >=64 RESISTANT Resistant   ?  CIPROFLOXACIN >=4 RESISTANT Resistant   ?  GENTAMICIN <=1 SENSITIVE Sensitive   ?  IMIPENEM <=0.25 SENSITIVE Sensitive   ?  NITROFURANTOIN <=16 SENSITIVE Sensitive   ?  TRIMETH/SULFA >=320 RESISTANT Resistant   ?  AMPICILLIN/SULBACTAM 8 SENSITIVE Sensitive   ?  PIP/TAZO <=4 SENSITIVE Sensitive   ?  * >=100,000 COLONIES/mL ESCHERICHIA COLI  ? Pseudomonas aeruginosa - MIC*  ?  CEFTAZIDIME 4 SENSITIVE Sensitive   ?  CIPROFLOXACIN 0.5 SENSITIVE Sensitive   ?  GENTAMICIN <=1 SENSITIVE Sensitive   ?  IMIPENEM 2 SENSITIVE Sensitive   ?  PIP/TAZO <=4 SENSITIVE Sensitive   ?  CEFEPIME 2 SENSITIVE Sensitive   ?  * 60,000 COLONIES/mL PSEUDOMONAS AERUGINOSA  ?Resp Panel by RT-PCR (Flu A&B, Covid) Nasopharyngeal Swab     Status: None  ? Collection Time: 03/14/21  2:09 AM  ? Specimen: Nasopharyngeal Swab; Nasopharyngeal(NP) swabs in vial transport medium  ?Result Value Ref Range Status  ? SARS Coronavirus 2 by RT PCR NEGATIVE NEGATIVE Final  ?  Comment: (NOTE) ?SARS-CoV-2 target nucleic acids are NOT DETECTED. ? ?The SARS-CoV-2 RNA is generally detectable in upper respiratory ?specimens during the acute phase of infection. The lowest ?concentration  of SARS-CoV-2 viral copies this assay can detect is ?138 copies/mL. A negative result does not preclude SARS-Cov-2 ?infection and should not be used as the sole basis for treatment or ?other patient ma

## 2021-04-23 ENCOUNTER — Inpatient Hospital Stay (HOSPITAL_COMMUNITY): Payer: Medicare Other

## 2021-04-23 DIAGNOSIS — E876 Hypokalemia: Secondary | ICD-10-CM | POA: Diagnosis not present

## 2021-04-23 DIAGNOSIS — K521 Toxic gastroenteritis and colitis: Secondary | ICD-10-CM | POA: Diagnosis not present

## 2021-04-23 DIAGNOSIS — M8618 Other acute osteomyelitis, other site: Secondary | ICD-10-CM | POA: Diagnosis not present

## 2021-04-23 DIAGNOSIS — I5032 Chronic diastolic (congestive) heart failure: Secondary | ICD-10-CM | POA: Diagnosis not present

## 2021-04-23 HISTORY — PX: IR EXT NEPHROURETERAL CATH EXCHANGE: IMG5418

## 2021-04-23 LAB — BASIC METABOLIC PANEL
Anion gap: 7 (ref 5–15)
BUN: 28 mg/dL — ABNORMAL HIGH (ref 8–23)
CO2: 13 mmol/L — ABNORMAL LOW (ref 22–32)
Calcium: 8 mg/dL — ABNORMAL LOW (ref 8.9–10.3)
Chloride: 118 mmol/L — ABNORMAL HIGH (ref 98–111)
Creatinine, Ser: 1.49 mg/dL — ABNORMAL HIGH (ref 0.44–1.00)
GFR, Estimated: 38 mL/min — ABNORMAL LOW (ref >60)
Glucose, Bld: 93 mg/dL (ref 70–99)
Potassium: 3.6 mmol/L (ref 3.5–5.1)
Sodium: 138 mmol/L (ref 135–145)

## 2021-04-23 LAB — CBC
HCT: 26.9 % — ABNORMAL LOW (ref 36.0–46.0)
Hemoglobin: 8.8 g/dL — ABNORMAL LOW (ref 12.0–15.0)
MCH: 27.9 pg (ref 26.0–34.0)
MCHC: 32.7 g/dL (ref 30.0–36.0)
MCV: 85.4 fL (ref 80.0–100.0)
Platelets: 525 10*3/uL — ABNORMAL HIGH (ref 150–400)
RBC: 3.15 MIL/uL — ABNORMAL LOW (ref 3.87–5.11)
RDW: 15.3 % (ref 11.5–15.5)
WBC: 6.2 10*3/uL (ref 4.0–10.5)
nRBC: 0 % (ref 0.0–0.2)

## 2021-04-23 LAB — MAGNESIUM: Magnesium: 1.8 mg/dL (ref 1.7–2.4)

## 2021-04-23 MED ORDER — IOHEXOL 300 MG/ML  SOLN
INTRAMUSCULAR | Status: DC | PRN
  Administered 2021-04-23: 50 mL

## 2021-04-23 NOTE — Progress Notes (Addendum)
Pharmacy Antibiotic Note ? ?Shelia Chan is a 66 y.o. female admitted on 04/20/2021 with with a hx of squamous cell carcinoma of the urethra S/p cystectomy, urethrectomy & ileal conduit, stage IV CKD, COPD, CHF, and pubic bone osteomyelitis bilaterally with myositis on PICC abx (meropenem) who presents to the emergency department with complaints of vaginal bleeding for the past 4 days.  Pharmacy has been consulted to continue meropenem dosing. ? ?Plan: ?Continue merrem 1gm IV q12h through 05/07/21 ?Monitor renal function ? ?Pharmacy will formally sign off of consult but continue to monitor patient peripherally. ? ?Height: '5\' 1"'$  (154.9 cm) ?Weight: 59.9 kg (132 lb 0.9 oz) ?IBW/kg (Calculated) : 47.8 ? ?Temp (24hrs), Avg:98.5 ?F (36.9 ?C), Min:98.2 ?F (36.8 ?C), Max:98.7 ?F (37.1 ?C) ? ?Recent Labs  ?Lab 04/20/21 ?2158 04/21/21 ?9563 04/22/21 ?8756 04/23/21 ?4332  ?WBC 7.6 6.6 6.8 6.2  ?CREATININE 1.91* 1.62* 1.69* 1.49*  ? ?  ?Estimated Creatinine Clearance: 30.4 mL/min (A) (by C-G formula based on SCr of 1.49 mg/dL (H)).   ? ?Allergies  ?Allergen Reactions  ? Metoprolol Shortness Of Breath  ? ? ? ?Thank you for allowing pharmacy to be a part of this patient?s care. ? ?Dimple Nanas, PharmD ?04/23/2021 7:33 AM ? ?

## 2021-04-23 NOTE — Progress Notes (Signed)
Patient ID: Shelia Chan, female   DOB: 09-Dec-1955, 66 y.o.   MRN: [id number] ?I came by the bedside to see and examine [name].  I have seen her once before.  I was able to review her MRI and her studies.  Her vital signs are stable and she is not septic appearing.  Her white blood cell count is also stable.  There are no open wounds around her pubis area.  There is some pain to palpation of just to the right side of the midline.  I did review the MRI.  She does have some evidence of a fracture just to the right of the pubis.  There is edema in the bone in the area and some edema in the musculature.  Some of this may be consistent with trauma and osteomyelitis.  This is certainly a tough situation.  None of [place] from a general orthopedic surgery standpoint are comfortable with pelvic surgery especially surgery on the pubis which could cause destabilizing the pelvis.  He is a surgery like this, if warranted, is performed at a tertiary care center and/or by subspecialist who are comfortable with pelvic surgery.  The 2 orthopedic traumatologists in town here (Dr. Lowell Guitar. Doreatha Martin) do perform pelvic surgery.  However, those 2 surgeons are out of town for the next week.  They also only operate at [facility].  I personally have never performed any type of pelvic surgery and have never performed surgery in this area.  She is on IV antibiotics and certainly should continue IV antibiotics.  It does not appear if there is a need for emergent or urgent surgery.  She does need follow-up with an orthopedic specialist who is comfortable with surgery around the pelvis which is usually orthopedic traumatologist or even orthopedic surgery oncologist.  Again, this is better treated at a tertiary center.  I have no further recommendations for the patient during this hospitalization.  I talked her in length about her situation. ?

## 2021-04-23 NOTE — Procedures (Signed)
Interventional Radiology Procedure: ? ? ?Indications: History of urethral cancer and s/p cystectomy with bilateral retrograde nephroureteral catheters.  Catheters need routine exchange ? ?Procedure: Exchange of bilateral nephroureteral catheters ? ?Findings: New bilateral 10 Fr drains in renal pelvis. ? ?Complications: No immediate complications noted. ?    ?EBL: Minimal ? ?Plan: Future routine exchanges ? ? ?Key Cen R. Anselm Pancoast, MD  ?Pager: [phone] ? ? ? ?  ?

## 2021-04-23 NOTE — Progress Notes (Signed)
?  [facility] for Infectious Disease ? ? ?Reason for visit: Follow up on pelvic osteomyelitis ?History of squamous cell carcinoma of the urethra with cystectomy, ureterectomy and ileal conduit and on treatment for pubic osteomyelitis and myositis now with fracture of the right pubic body.   ? ?Interval History: remains afebrile, WBC wnl. No acute events.  ? ? ?Physical Exam: ?Constitutional:  ?Vitals:  ? 04/23/21 0601 04/23/21 1342  ?BP: 119/69 115/63  ?Pulse: 63 (!) 58  ?Resp: 18 17  ?Temp: 98.7 ?F (37.1 ?C) 98.2 ?F (36.8 ?C)  ?SpO2: 99% 99%  ? patient appears in NAD ?Respiratory: Normal respiratory effort; CTA B ?Cardiovascular: RRR ? ? ?Review of Systems: ?Constitutional: negative for fevers and chills ? ?Lab Results  ?Component Value Date  ? WBC 6.2 04/23/2021  ? HGB 8.8 (L) 04/23/2021  ? HCT 26.9 (L) 04/23/2021  ? MCV 85.4 04/23/2021  ? PLT 525 (H) 04/23/2021  ?  ?Lab Results  ?Component Value Date  ? CREATININE 1.49 (H) 04/23/2021  ? BUN 28 (H) 04/23/2021  ? NA 138 04/23/2021  ? K 3.6 04/23/2021  ? CL 118 (H) 04/23/2021  ? CO2 13 (L) 04/23/2021  ?  ?Lab Results  ?Component Value Date  ? ALT 14 04/20/2021  ? AST 18 04/20/2021  ? ALKPHOS 156 (H) 04/20/2021  ?  ? ?Microbiology: ?No results found for this or any previous visit (from the past 240 hour(s)). ? ?Impression/Plan:  ?1. Pubic osteomyelitis, myositis, septic joint - on meropenem with unknown growth and was projected to get treatment through 4/14 but I will  extend this through 4/21  ?She has follow up with Dr. Candiss Norse on 4/14 ?CRP 4.6, ESR 46 - these are significantly improved overall.  ?Seen by orthopedics and Dr. Ninfa Linden has recommended referral to an tertiary care center.   ? ?2.  Vaginal spotting - seen by gynecology and no vaginal issues.  Felt secondary to the pubic bone fracture.  ? ?3. Chronic renal insufficiency - stable and no changes.  On twice daily dosing of meropenem and will continue.   ? ?I will sign off, call with questions.  ? ?  ?

## 2021-04-23 NOTE — Progress Notes (Addendum)
?PROGRESS NOTE ? ? ? ?Patient: Shelia Chan                            PCP: [name], MD                    ?DOB: 1955-08-19            DOA: 04/20/2021 ?XNT:[id number]             DOS: 04/23/2021, 1:02 PM ? ? LOS: 2 days  ? ?Date of Service: The patient was seen and examined on 04/23/2021 ? ?Subjective:  ? ?The patient was seen and examined this morning, laying still in bed, still complaining of suprapubic pain..  But tolerable with pain medication ?Reporting no further spotting or bloody discharge vaginally. ?Hemodynamically stable -afebrile normotensive this morning ? ?Brief Narrative:  ? ? ? ?Shelia Chan is a 66 y.o. female with medical history significant of uretheral CA s/p bladder resection, ileal conduit, CKD 3-4, dCHF. ?  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  Pubic bone biopsy for cultures NGTD.  Pt ultimately put on 6 weeks of Merrem per ID. ?  ?She has diarrhea since last hospital stay, constant, unchanged, not worsening. May be due to Believed to be due to ABx. ?  ?She reports  onset of Right pelvic pain a few days after discharge from last admit. ?  ?Presents to ED with c/o new onset vaginal bleeding, described as "spotting" for the past 4 days.  Symptoms constant and ongoing.  Pt has had hysterectomy in past. ?  ?Due to anatomy from various surgeries, pt was unable to tolerate EDP attempting to do a speculum exam. ? No fever, chills, N/V. 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 ? ?Admission: For concern of ongoing osteomyelitis of pubic bone, subacute fracture of pubic bone..  Likely evaluation from ID and urology possible GYN ? ?ED:  ?Vitals; BP 119/57, pulse 58, RR 16, satting 100% room air, temp 97.4 ?K 2.8,  ?Bicarb 16 (down from 30 last month) ?Creat 1.91 up from 1.7 a month ago, BUN 41 up from 25 a month ago. ?HGB 9.6 down from 11.1 in early March ?WBC: 7.6 ? ?CTAP =  IMPRESSION: ?1. New subacute appearing fracture of the right pubic bone. New new ?air in the pubic symphysis which may be secondary to fracture, but ?septic arthritis is not excluded given the clinical history. ?2. Edema within the soft tissue surrounding the pubic symphysis and ?in the right iliacus muscle similar to the prior study. No definite ?fluid collection identified allowing for lack of contrast. ?3. Subcutaneous edema in the lower anterior pelvis may be related to ?cellulitis. ?4. Questionable small amount of free fluid and free air in the lower ?left pelvis, extraluminal. Abscess can not be excluded in this ?region. Bowel perforation can also not be excluded in this region. ?5. New moderate severe right-sided hydronephrosis. Bilateral ?ureteral stents remain in adequate position. ?6. Air-fluid levels throughout the colon can be seen in the setting ?of diarrhea. ? ? ? ?Assessment & Plan:  ? ?Principal Problem: ?  Pubic bone Osteomyelitis, bilaterally, with myositis ?Active Problems: ?  Pubic bone fracture (HCC) ?  Hydronephrosis of right kidney ?  Vaginal discharge ?  CKD (chronic kidney disease), stage IV ([place]) ?  Chronic diastolic CHF (  congestive heart failure) ([place]) ?  Squamous cell carcinoma of the urethra ?  Antibiotic-associated diarrhea ?  Hypokalemia ?  Vaginal bleeding ?  Septic arthritis ([place]) ? ? ? ? ?Assessment and Plan: ?* Pubic bone Osteomyelitis, bilaterally, with myositis ?Continue to complain of tenderness, uncomfortable ?Pt remains on merrem at this time. ?Given new subacute fx of R pubic bone: ?Ordered repeat MRI of pelvis to evaluate ?ESR 46 (mildly elevated) ?CRP 4.6 (mildly elevated) ?Infectious disease team consulted--- appreciate input -current recommendation is to continue meropenem ? ?No signs of SIRS or sepsis ? ?Will continue merrem ?Orthopedic team has been consulted for evaluation recommendation ?Concern for eroding infection through the bone causing fracture ?MRI of pelvic:  Reviewed-reported ?Continued substantial pubic osteomyelitis/septic joint, including ?a fracture of the right pubic body. The amount of associated edema ?is increased compared to 03/16/2021, and there is substantial ?regional myositis along the hip adductor musculature and right ?obturator internus. ? ?-Discussed with orthopedic team, infectious disease --continue current IV antibiotics ? ? ?Pubic bone fracture ([place]) ?- Imaging including MRI of the pelvic was reviewed: ?Continued substantial pubic osteomyelitis/septic joint, including ?a fracture of the right pubic body. The amount of associated edema ?is increased compared to 03/16/2021, and there is substantial ?regional myositis along the hip adductor musculature and right ?obturator internus. ? ?-Orthopedic team follow Ortho care doctor next has been notified ?Seen on last admission by Dr. Ninfa Linden on 03/17/2021 ? ?-Pending orthopedic input-appreciate evaluation and recommendations ? ?Vaginal discharge ?-Resolved ?Vaginal bleeding, "spotting".  Not large volume bleed. ?-S/p hysterectomy previously--patient reports it was many years ago, in [place] ? ?HGB down slightly. ?No blood thinners ?Monitor CBC ?EDP attempted speculum exam but pt unable to tolerate. ?Consulted OB/GYN for further evaluation recommendation -reporting of no specific findings..  ? ?However wondering if this is related to the pubic bone osteomyelitis / fracture, and not OB in nature? ?History of hysterectomy, no further finding specified to vaginal/uterine with exception of inflammatory process ? ?Hydronephrosis of right kidney ?-severe hydro of R kidney despite good positioning of bilateral ureteral stents according to CT scan. ?Consulted urologist-discussed the case in detail, recommended consulting IR for stent exchange. ?Pending IR for exchange of stent today 04/23/2021 ? ?Urologist was not concerned about chronic hydronephrosis ? ?Urostomy functional,  in place, clear urine noted, patient  afebrile, normotensive with no leukocytosis ? ?Hypokalemia ?Repleting with p.o. potassium and magnesium ?Potassium 2.8, 3.0 >>>3.3 >> 3.6 ?Magnesium 1.2 >>> 1.8 ? ? ?Antibiotic-associated diarrhea ?This ongoing for several weeks. ?Unchanged today..  2 episodes last 24 hours ?No WBC nor fever. ? ID was not impressed for C.Diff . ?Holding off on ordering any C.Diff testing. ?No signs of overt infection such as SIRS,-leukocytosis, afebrile, normotensive ? ?Squamous cell carcinoma of the urethra ?S/p surgery ureteral bladder resection ?Urostomy in place, clean urine output noted ?Currently believed to be in long term remission as of 2016. ? ?-With chronic hydronephrosis and stent which is changed routinely by IR ?-Urologist was consulted, recommended no further work-up ?-IR consulted for stent exchange. ? ?Chronic diastolic CHF (congestive heart failure) ([place]) ?-Med rec reviewed ?Holding lasix for the moment. ? ?CKD (chronic kidney disease), stage IV ([place]) ?Chronic and stable, slightly worse than baseline today. ?See hydronephrosis above. ?-Monitoring BUN/creatinine closely ?Creatinine 1.91>> 1.62 >>1.96 >> 1.49 ? ? ?Septic arthritis ([place]) ?- Per MRI of pelvic: substantial pubic osteomyelitis/septic joint, includinga fracture of the right pubic body. The amount of associated edema is increased compared to 03/16/2021, ?-Appreciate orthopedic and infectious  disease team recommendations ?-Continue current antibiotics, as needed analgesics ? ?Vaginal bleeding ?-Resolved ?Monitoring ?-No significant bleeding only spotting reported per patient ?- GYN consulted, status post evaluation, negative for any findings ?GYN not impressed..  Likely due to inflammation, due to infection, \ chronic osteomyelitis and pelvic fracture ? ?-CT and MRI abdomen pelvis and did not reveal any specific pathology  ? ? ? ? ?Consult: ID/urology/orthopedic/IR/GYN ?Appreciate everyone's effort and input in this  case ? ? ? ? ? ? ?----------------------------------------------------------------------------------------------------------------------------------------------- ?Nutritional status:  ?The patient's BMI is: Body mass index is 24.95 kg/m?. ?I agr

## 2021-04-23 NOTE — Progress Notes (Signed)
PHARMACY CONSULT NOTE FOR: ? ?OUTPATIENT  PARENTERAL ANTIBIOTIC THERAPY (OPAT) ? ?Indication: Pubic bone osteomyelitis ?Regimen: Meropenem 1 gm every 12 hours  ?End date: 05/07/21 ? ?IV antibiotic discharge orders are pended. ?To discharging provider:  please sign these orders via discharge navigator,  ?Select New Orders & click on the button choice - Manage This Unsigned Work.  ?  ? ?Thank you for allowing pharmacy to be a part of this patient's care. ? ?Jimmy Footman, PharmD, BCPS, BCIDP ?Infectious Diseases Clinical Pharmacist ?Phone: [phone] ?04/23/2021, 11:04 AM ? ?

## 2021-04-23 NOTE — Care Management Important Message (Signed)
Important Message ? ?Patient Details IM Letter given to the Patient. ?Name: Shelia Chan ?MRN: [id number] ?Date of Birth: 1955/04/30 ? ? ?Medicare Important Message Given:  Yes ? ? ? ? ?Kerin Salen ?04/23/2021, 12:54 PM ?

## 2021-04-23 NOTE — Plan of Care (Signed)
?  Problem: Education: ?Goal: Knowledge of General Education information will improve ?Description: Including pain rating scale, medication(s)/side effects and non-pharmacologic comfort measures ?Outcome: Progressing ?  ?Problem: Health Behavior/Discharge Planning: ?Goal: Ability to manage health-related needs will improve ?Outcome: Progressing ?  ?Problem: Clinical Measurements: ?Goal: Ability to maintain clinical measurements within normal limits will improve ?Outcome: Progressing ?Goal: Will remain free from infection ?Outcome: Not Progressing ?Goal: Diagnostic test results will improve ?Outcome: Progressing ?Goal: Respiratory complications will improve ?Outcome: Progressing ?Goal: Cardiovascular complication will be avoided ?Outcome: Progressing ?  ?Problem: Activity: ?Goal: Risk for activity intolerance will decrease ?Outcome: Progressing ?  ?Problem: Nutrition: ?Goal: Adequate nutrition will be maintained ?Outcome: Progressing ?  ?Problem: Coping: ?Goal: Level of anxiety will decrease ?Outcome: Progressing ?  ?Problem: Elimination: ?Goal: Will not experience complications related to bowel motility ?Outcome: Progressing ?Goal: Will not experience complications related to urinary retention ?Outcome: Progressing ?  ?Problem: Pain Managment: ?Goal: General experience of comfort will improve ?Outcome: Progressing ?  ?Problem: Safety: ?Goal: Ability to remain free from injury will improve ?Outcome: Progressing ?  ?Problem: Skin Integrity: ?Goal: Risk for impaired skin integrity will decrease ?Outcome: Progressing ?  ?

## 2021-04-23 NOTE — Assessment & Plan Note (Signed)
-   Per MRI of pelvic: substantial pubic osteomyelitis/septic joint, includinga fracture of the right pubic body. The amount of associated edema is increased compared to 03/16/2021, ?-Appreciate orthopedic and infectious disease team recommendations ?-Continue current antibiotics, as needed analgesics ?

## 2021-04-24 LAB — BASIC METABOLIC PANEL
Anion gap: 5 (ref 5–15)
BUN: 25 mg/dL — ABNORMAL HIGH (ref 8–23)
CO2: 15 mmol/L — ABNORMAL LOW (ref 22–32)
Calcium: 7.9 mg/dL — ABNORMAL LOW (ref 8.9–10.3)
Chloride: 117 mmol/L — ABNORMAL HIGH (ref 98–111)
Creatinine, Ser: 1.36 mg/dL — ABNORMAL HIGH (ref 0.44–1.00)
GFR, Estimated: 43 mL/min — ABNORMAL LOW (ref >60)
Glucose, Bld: 98 mg/dL (ref 70–99)
Potassium: 2.9 mmol/L — ABNORMAL LOW (ref 3.5–5.1)
Sodium: 137 mmol/L (ref 135–145)

## 2021-04-24 LAB — CBC
HCT: 26.1 % — ABNORMAL LOW (ref 36.0–46.0)
Hemoglobin: 8.6 g/dL — ABNORMAL LOW (ref 12.0–15.0)
MCH: 28 pg (ref 26.0–34.0)
MCHC: 33 g/dL (ref 30.0–36.0)
MCV: 85 fL (ref 80.0–100.0)
Platelets: 512 10*3/uL — ABNORMAL HIGH (ref 150–400)
RBC: 3.07 MIL/uL — ABNORMAL LOW (ref 3.87–5.11)
RDW: 15.4 % (ref 11.5–15.5)
WBC: 6.6 10*3/uL (ref 4.0–10.5)
nRBC: 0 % (ref 0.0–0.2)

## 2021-04-24 MED ORDER — HEPARIN SOD (PORK) LOCK FLUSH 100 UNIT/ML IV SOLN
250.0000 [IU] | INTRAVENOUS | Status: AC | PRN
  Administered 2021-04-24: 250 [IU]
  Filled 2021-04-24: qty 3

## 2021-04-24 MED ORDER — POTASSIUM CHLORIDE CRYS ER 20 MEQ PO TBCR
60.0000 meq | EXTENDED_RELEASE_TABLET | ORAL | Status: DC
  Administered 2021-04-24: 60 meq via ORAL
  Filled 2021-04-24: qty 3

## 2021-04-24 MED ORDER — POTASSIUM CHLORIDE CRYS ER 20 MEQ PO TBCR: 40.0000 meq | EXTENDED_RELEASE_TABLET | ORAL | Status: DC

## 2021-04-24 MED ORDER — MEROPENEM IV (FOR PTA / DISCHARGE USE ONLY): 1.0000 g | Freq: Two times a day (BID) | INTRAVENOUS | 0 refills | Status: AC

## 2021-04-24 NOTE — Progress Notes (Signed)
Discharge instructions discussed with patient, verbalized agreement and understanding 

## 2021-04-24 NOTE — TOC Transition Note (Signed)
Transition of Care (TOC) - CM/SW Discharge Note ? ? ?Patient Details  ?Name: Shelia Chan ?MRN: [id number] ?Date of Birth: February 14, 1955 ? ?Transition of Care (TOC) CM/SW Contact:  ?Kista Robb, LCSW ?Phone Number: ?04/24/2021, 1:39 PM ? ? ?Clinical Narrative:    ?Alerted by MD that pt is medically cleared for dc home today.  Spoke with pt as well as Carolynn Sayers, RN with [employer] (already open and providing home IV abx coverage) and Cory with [facility] (already open for [facility] coverage).  All are agreed and ready for dc as well.  No further TOC needs. ? ? ?Final next level of care: Home/Self Care ?Barriers to Discharge: Barriers Resolved ? ? ?Patient Goals and CMS Choice ?Patient states their goals for this hospitalization and ongoing recovery are:: return home ?  ?  ? ?Discharge Placement ?  ?           ?  ?  ?  ?  ? ?Discharge Plan and Services ?  ?  ?           ?DME Arranged: N/A ?DME Agency: NA ?  ?  ?  ?HH Arranged: RN, IV Antibiotics ?[place] Agency: [facility], [facility] ?  ?  ?  ? ?Social Determinants of Health (SDOH) Interventions ?  ? ? ?Readmission Risk Interventions ?   ? View : No data to display.  ?  ?  ?  ? ? ? ? ? ?

## 2021-04-24 NOTE — Discharge Summary (Signed)
?Physician Discharge Summary ?  ?Patient: Shelia Chan MRN: [id number] DOB: [birth year]  ?Admit date:     04/20/2021  ?Discharge date: 04/24/21  ?Discharge Physician: Marylu Lund  ? ?PCP: [name], MD  ? ?Recommendations at discharge:  ? ? Follow up with PCP in 1-2 weeks ?Recommend referral to Orthopedic Surgeon at [facility] center per recs of Orthopedic Surgery ? ?Discharge Diagnoses: ?Principal Problem: ?  Pubic bone Osteomyelitis, bilaterally, with myositis ?Active Problems: ?  Pubic bone fracture (HCC) ?  Hydronephrosis of right kidney ?  Vaginal discharge ?  CKD (chronic kidney disease), stage IV ([place]) ?  Chronic diastolic CHF (congestive heart failure) ([place]) ?  Squamous cell carcinoma of the urethra ?  Antibiotic-associated diarrhea ?  Hypokalemia ?  Vaginal bleeding ?  Septic arthritis ([place]) ? ?Resolved Problems: ?  * No resolved hospital problems. * ? ?Hospital Course: ? ? ?Shelia Chan is a 66 y.o. female with medical history significant of uretheral CA s/p bladder resection, ileal conduit, CKD 3-4, dCHF. ?  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  Pubic bone biopsy for cultures NGTD.  Pt ultimately put on 6 weeks of Merrem per ID. ?  ?She has diarrhea since last hospital stay, constant, unchanged, not worsening. May be due to Believed to be due to ABx. ?  ?She reports  onset of Right pelvic pain a few days after discharge from last admit. ?  ?Presents to ED with c/o new onset vaginal bleeding, described as "spotting" for the past 4 days.  Symptoms constant and ongoing.  Pt has had hysterectomy in past. ?  ?Due to anatomy from various surgeries, pt was unable to tolerate EDP attempting to do a speculum exam. ? No fever, chills, N/V. 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 ? ?Admission: For concern of ongoing osteomyelitis of pubic bone, subacute fracture of pubic bone..   Likely evaluation from ID and urology possible GYN ? ?ED:  ?Vitals; BP 119/57, pulse 58, RR 16, satting 100% room air, temp 97.4 ?K 2.8,  ?Bicarb 16 (down from 30 last month) ?Creat 1.91 up from 1.7 a month ago, BUN 41 up from 25 a month ago. ?HGB 9.6 down from 11.1 in early March ?WBC: 7.6 ? ?CTAP = IMPRESSION: ?1. New subacute appearing fracture of the right pubic bone. New new ?air in the pubic symphysis which may be secondary to fracture, but ?septic arthritis is not excluded given the clinical history. ?2. Edema within the soft tissue surrounding the pubic symphysis and ?in the right iliacus muscle similar to the prior study. No definite ?fluid collection identified allowing for lack of contrast. ?3. Subcutaneous edema in the lower anterior pelvis may be related to ?cellulitis. ?4. Questionable small amount of free fluid and free air in the lower ?left pelvis, extraluminal. Abscess can not be excluded in this ?region. Bowel perforation can also not be excluded in this region. ?5. New moderate severe right-sided hydronephrosis. Bilateral ?ureteral stents remain in adequate position. ?6. Air-fluid levels throughout the colon can be seen in the setting ?of diarrhea. ? ?Assessment and Plan: ?* Pubic bone Osteomyelitis, bilaterally, with myositis ?Continue to complain of tenderness, uncomfortable ?Pt remains on merrem at this time. ?Given new subacute fx of R pubic bone: ?Ordered repeat MRI of pelvis to evaluate ?ESR 46 (mildly elevated) ?CRP 4.6 (mildly elevated) ?Infectious disease team consulted--- appreciate input -  current recommendation is to continue meropenem ? ?No signs of SIRS or sepsis ? ?Will continue merrem ?Orthopedic surgery recs per above ?MRI of pelvic: Reviewed-reported ?Continued substantial pubic osteomyelitis/septic joint, including ?a fracture of the right pubic body. The amount of associated edema ?is increased compared to 03/16/2021, and there is substantial ?regional myositis along the hip  adductor musculature and right ?obturator internus. ? ?-Seen by Orthopedic team, infectious disease, per above. To continue IV meropenem per ID ? ? ?Pubic bone fracture (HCC) ?- Imaging including MRI of the pelvic was reviewed: ?Continued substantial pubic osteomyelitis/septic joint, including ?a fracture of the right pubic body. The amount of associated edema ?is increased compared to 03/16/2021, and there is substantial ?regional myositis along the hip adductor musculature and right ?obturator internus. ? ?-Orthopedic team follow Ortho care doctor next has been notified ?Seen on last admission by Dr. Ninfa Linden on 03/17/2021 ? ?-Seen by Orthopedic Surgery. Given location of fracture, will need to be seen at tertiary care center, but no emergent or urgent need for surgery at this time, per Ortho ? ?-Recommend referral to Ortho at tertiary care center ? ?Vaginal discharge ?-Resolved ?Vaginal bleeding, "spotting".  Not large volume bleed. ?-S/p hysterectomy previously--patient reports it was many years ago, in [place] ? ?HGB down slightly. ?No blood thinners ?Monitor CBC ?EDP attempted speculum exam but pt unable to tolerate. ?Consulted OB/GYN for further evaluation recommendation -reporting of no specific findings..  ? ?However wondering if this is related to the pubic bone osteomyelitis / fracture, and not OB in nature? ?History of hysterectomy, no further finding specified to vaginal/uterine with exception of inflammatory process ? ?Hydronephrosis of right kidney ?-severe hydro of R kidney despite good positioning of bilateral ureteral stents according to CT scan. ?Consulted urologist-discussed the case in detail, recommended consulting IR for stent exchange. ?Pending IR for exchange of stent today 04/23/2021 ? ?Urologist was not concerned about chronic hydronephrosis ? ?Urostomy functional,  in place, clear urine noted, patient afebrile, normotensive with no leukocytosis ? ?Patient to follow up with Urology as  outpatient ? ?Hypokalemia ?Electrolytes were corrected ? ? ?Antibiotic-associated diarrhea ?This ongoing for several weeks. ?Improved ?No WBC nor fever. ? ID was not impressed for C.Diff . ?Holding off on ordering any C.Diff testing. ?No signs of overt infection such as SIRS,-leukocytosis, afebrile, normotensive ? ?Squamous cell carcinoma of the urethra ?S/p surgery ureteral bladder resection ?Urostomy in place, clean urine output noted ?Currently believed to be in long term remission as of 2016. ? ?-With chronic hydronephrosis and stent which is changed routinely by IR ?-Urologist was consulted, recommended no further work-up ?-IR consulted for stent exchange. ? ?Chronic diastolic CHF (congestive heart failure) ([place]) ?-Med rec reviewed ?-Briefly held lasix while in hospital ? ?CKD (chronic kidney disease), stage IV ([place]) ?Chronic and stable, slightly worse than baseline ?See hydronephrosis above. ?-Monitoring BUN/creatinine closely ?Creatinine 1.91>> 1.62 >>1.96 >> 1.49 ? ? ?Septic arthritis ([place]) ?- Per MRI of pelvic: substantial pubic osteomyelitis/septic joint, includinga fracture of the right pubic body. The amount of associated edema is increased compared to 03/16/2021, ?-Appreciate orthopedic and infectious disease team recommendations ?-Continue current antibiotics, as needed analgesics ? ?Vaginal bleeding ?-Resolved ?Monitoring ?-No significant bleeding only spotting reported per patient ?- GYN consulted, status post evaluation, negative for any findings ?GYN not impressed..  Likely due to inflammation, due to infection, \ chronic osteomyelitis and pelvic fracture ? ?-CT and MRI abdomen pelvis and did not reveal any specific pathology  ? ? ? ? ?  ? ? ?  Consultants: ID, Orthopedic Surgery, Urology ?Procedures performed: B nephroureteral cather exchanged  ?Disposition: Home health ?Diet recommendation:  ?Regular diet ?DISCHARGE MEDICATION: ?Allergies as of 04/24/2021   ? ?   Reactions  ? Metoprolol Shortness Of  Breath  ? ?  ? ?  ?Medication List  ?  ? ?TAKE these medications   ? ?acetaminophen 500 MG tablet ?Commonly known as: TYLENOL ?Take 500-1,000 mg by mouth every 6 (six) hours as needed for moderate pain or fever.

## 2021-04-26 ENCOUNTER — Ambulatory Visit: Payer: Medicare Other | Admitting: Nurse Practitioner

## 2021-04-26 NOTE — Progress Notes (Deleted)
? ? ?Office Visit  ?  ?Patient Name: Shelia Chan ?Date of Encounter: 04/26/2021 ? ?Primary Care Provider:  [name], MD ?Primary Cardiologist:  Evalina Field, MD ? ?Chief Complaint  ?  ?66 year old female with a history of chronic diastolic heart failure, palpitations, atrial tachycardia, CKD stage IV, COPD, and bladder cancer who presents for follow-up related to heart failure. ? ?Past Medical History  ?  ?Past Medical History:  ?Diagnosis Date  ? Arthritis   ? Asthma   ? Blood transfusion without reported diagnosis   ? Cancer [facility])   ? CHF (congestive heart failure) ([place])   ? COPD (chronic obstructive pulmonary disease) ([place])   ? Heart murmur   ? Immune deficiency disorder ([place])   ? Renal disorder   ? ?Past Surgical History:  ?Procedure Laterality Date  ? BLADDER REMOVAL  118/22/2015  ? FRACTURE SURGERY Left 02/2018  ? pins  ? IR CATHETER TUBE CHANGE  04/15/2019  ? IR CATHETER TUBE CHANGE  04/15/2019  ? IR EXT NEPHROURETERAL CATH EXCHANGE  07/15/2019  ? IR EXT NEPHROURETERAL CATH EXCHANGE  10/14/2019  ? IR EXT NEPHROURETERAL CATH EXCHANGE  10/14/2019  ? IR EXT NEPHROURETERAL CATH EXCHANGE  01/06/2020  ? IR EXT NEPHROURETERAL CATH EXCHANGE  03/16/2020  ? IR EXT NEPHROURETERAL CATH EXCHANGE  05/25/2020  ? IR EXT NEPHROURETERAL CATH EXCHANGE  07/17/2020  ? IR EXT NEPHROURETERAL CATH EXCHANGE  07/17/2020  ? IR EXT NEPHROURETERAL CATH EXCHANGE  08/14/2020  ? IR EXT NEPHROURETERAL CATH EXCHANGE  09/25/2020  ? IR EXT NEPHROURETERAL CATH EXCHANGE  11/06/2020  ? IR EXT NEPHROURETERAL CATH EXCHANGE  12/25/2020  ? IR EXT NEPHROURETERAL CATH EXCHANGE  02/09/2021  ? IR EXT NEPHROURETERAL CATH EXCHANGE  03/19/2021  ? IR EXT NEPHROURETERAL CATH EXCHANGE  03/19/2021  ? IR EXT NEPHROURETERAL CATH EXCHANGE  04/23/2021  ? IR EXT NEPHROURETERAL CATH EXCHANGE  04/23/2021  ? IR PERC TUN PERIT CATH WO PORT S&I /IMAG  03/25/2021  ? ? ?Allergies ? ?Allergies  ?Allergen Reactions  ? Metoprolol Shortness Of Breath  ? ? ?History of Present Illness  ?   ?66 year old female with the above past medical history including chronic diastolic heart failure, palpitations, atrial tachycardia, CKD stage IV, COPD, and bladder cancer. ? ?Cardiac monitor in 2021 in the setting of palpitations showed predominant underlying sinus rhythm, frequent episodes of ectopic atrial rhythm and ectopic atrial tachycardia, no atrial fibrillation.  Her symptoms improved with initiation of metoprolol, however, this was later discontinued as it was spectated to be contributing to her COPD.  Echocardiogram in March 2023 showed EF 65 to 70%, age, G1 DD, mild to moderate aortic valve regurgitation.  She was last seen in the office on 09/23/2020 reported stable palpitations, BP was well controlled.  She did have bilateral lower extremity edema which was thought to be related to lymphedema.  Low-salt diet and leg elevation were recommended.  She called our office on 04/20/2021 with complaints of 15 pound weight gain in 1 month, increased lower extremity edema.  Outpatient follow-up was advised. ? ?Additionally, she has a history of squama cell carcinoma of the urethra,  s/p cystectomy, ureterectomy, and ileal conduit formation in 2015.  She has a history of chronic bilateral hydronephrosis which requires ureteral stents that are changed every 6 weeks's by IR.  She was diagnosed with pubic bone osteomyelitis in February 2023 and has been on antibiotics.  She presented to the ED on 04/20/21 with complaints of vaginal bleeding.  CT of the abdomen pelvis showed new subacute appearing fracture of the right pubic bone, severe hydronephrosis of the right kidney despite good positioning of bilateral stents.  Stent exchange was recommended. ID, GYN, and urology were consulted. She was hospitalized from 04/20/2021-04/24/2021 in the setting of vaginal spotting, pubic bone osteomyelitis, pubic bone fracture, and hypokalemia (her potassium was 2.8 on admission, improved to 3.3).  Her Lasix was held during  hospitalization.  Discharged home on 04/24/2021 in stable condition with home health.  ? ?She presents today for follow-up.  Since her hospitalization ? ?Chronic diastolic heart failure: ?Palpitations/atrial tachycardia: ?CKD stage IV: ?Hypokalemia: ?Pubic bone osteomyelitis/pubic bone fracture/bilateral hydronephrosis: ?Disposition: ? ? ? ?Home Medications  ?  ?Current Outpatient Medications  ?Medication Sig Dispense Refill  ? acetaminophen (TYLENOL) 500 MG tablet Take 500-1,000 mg by mouth every 6 (six) hours as needed for moderate pain or fever.    ? albuterol (VENTOLIN HFA) 108 (90 Base) MCG/ACT inhaler Inhale 1 puff into the lungs every 6 (six) hours as needed for shortness of breath.    ? ARIPiprazole (ABILIFY) 30 MG tablet Take 30 mg by mouth daily.    ? aspirin EC 81 MG tablet Take 81 mg by mouth daily.    ? atorvastatin (LIPITOR) 10 MG tablet Take 10 mg by mouth at bedtime.    ? B Complex-C (B-COMPLEX WITH VITAMIN C) tablet Take 1 tablet by mouth daily.    ? busPIRone (BUSPAR) 7.5 MG tablet Take 7.5 mg by mouth 2 (two) times daily as needed (anxiety).    ? calcitRIOL (ROCALTROL) 0.25 MCG capsule Take 0.25 mcg by mouth daily.    ? Cholecalciferol (VITAMIN D-3) 25 MCG (1000 UT) CAPS Take 2,000 Units by mouth daily.    ? DULoxetine (CYMBALTA) 30 MG capsule Take 30 mg by mouth at bedtime.    ? ferrous sulfate 324 MG TBEC Take 324 mg by mouth daily with breakfast.    ? furosemide (LASIX) 40 MG tablet Take 40 mg by mouth daily.    ? HYDROcodone-acetaminophen (NORCO/VICODIN) 5-325 MG tablet Take 1 tablet by mouth every 8 hours as needed for pain 30 tablet 0  ? meropenem (MERREM) IVPB Inject 1 g into the vein every 12 (twelve) hours for 14 days. Indication:  Pubic osteo ?First Dose: Yes ?Last Day of Therapy:  05/07/21 ?Labs - Once weekly:  CBC/D and BMP, ?Labs - Every other week:  ESR and CRP ?Method of administration: Mini-Bag Plus / Gravity ?Method of administration may be changed at the discretion of home infusion  pharmacist based upon assessment of the patient and/or caregiver's ability to self-administer the medication ordered. 28 Units 0  ? methocarbamol (ROBAXIN) 500 MG tablet Take 500 mg by mouth 3 (three) times daily as needed for muscle spasms.    ? Multiple Vitamin (MULTIVITAMIN ADULT PO) Take 1 tablet by mouth daily.    ? pantoprazole (PROTONIX) 40 MG tablet Take 40 mg by mouth daily.    ? potassium chloride (KLOR-CON) 20 MEQ packet Take 20 mEq by mouth daily.    ? Probiotic Product (PROBIOTIC PO) Take 1 capsule by mouth daily.    ? vitamin C (ASCORBIC ACID) 500 MG tablet Take 500 mg by mouth 2 (two) times a week.    ? ?No current facility-administered medications for this visit.  ?  ? ?Review of Systems  ?  ?***.  All other systems reviewed and are otherwise negative except as noted above. ?  ? ?Physical Exam  ?  ?  VS:  There were no vitals taken for this visit. , BMI There is no height or weight on file to calculate BMI. ?    ?GEN: Well nourished, well developed, in no acute distress. ?HEENT: normal. ?Neck: Supple, no JVD, carotid bruits, or masses. ?Cardiac: RRR, no murmurs, rubs, or gallops. No clubbing, cyanosis, edema.  Radials/DP/PT 2+ and equal bilaterally.  ?Respiratory:  Respirations regular and unlabored, clear to auscultation bilaterally. ?GI: Soft, nontender, nondistended, BS + x 4. ?MS: no deformity or atrophy. ?Skin: warm and dry, no rash. ?Neuro:  Strength and sensation are intact. ?Psych: Normal affect. ? ?Accessory Clinical Findings  ?  ?ECG personally reviewed by me today - *** - no acute changes. ? ?Lab Results  ?Component Value Date  ? WBC 6.6 04/24/2021  ? HGB 8.6 (L) 04/24/2021  ? HCT 26.1 (L) 04/24/2021  ? MCV 85.0 04/24/2021  ? PLT 512 (H) 04/24/2021  ? ?Lab Results  ?Component Value Date  ? CREATININE 1.36 (H) 04/24/2021  ? BUN 25 (H) 04/24/2021  ? NA 137 04/24/2021  ? K 2.9 (L) 04/24/2021  ? CL 117 (H) 04/24/2021  ? CO2 15 (L) 04/24/2021  ? ?Lab Results  ?Component Value Date  ? ALT 14  04/20/2021  ? AST 18 04/20/2021  ? ALKPHOS 156 (H) 04/20/2021  ? BILITOT 0.1 (L) 04/20/2021  ? ?No results found for: CHOL, HDL, LDLCALC, LDLDIRECT, TRIG, CHOLHDL  ?No results found for: HGBA1C ? ?Assessment & Plan  ?  ?1.

## 2021-04-30 ENCOUNTER — Other Ambulatory Visit (HOSPITAL_COMMUNITY): Payer: Medicare Other

## 2021-04-30 ENCOUNTER — Other Ambulatory Visit: Payer: Self-pay

## 2021-04-30 ENCOUNTER — Ambulatory Visit (INDEPENDENT_AMBULATORY_CARE_PROVIDER_SITE_OTHER): Payer: Medicare Other | Admitting: Internal Medicine

## 2021-04-30 VITALS — BP 93/60 | HR 76 | Temp 98.4°F | Wt 131.4 lb

## 2021-04-30 DIAGNOSIS — S329XXS Fracture of unspecified parts of lumbosacral spine and pelvis, sequela: Secondary | ICD-10-CM | POA: Diagnosis not present

## 2021-04-30 NOTE — Progress Notes (Signed)
? ?   ? ? ? ? ?Patient Active Problem List  ? Diagnosis Date Noted  ? Pubic bone fracture ([place]) 04/22/2021  ?  Class: Acute  ? Septic arthritis ([place])   ? Hypokalemia 04/21/2021  ? Vaginal bleeding   ? 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 Thrombocytosis 03/23/2021  ? Greater trochanter fracture ([place]) 03/17/2021  ? Vaginal discharge 03/14/2021  ? COPD (chronic obstructive pulmonary disease) ([place])   ? Chronic diastolic CHF (congestive heart failure) ([place])   ? Depression with anxiety   ? UTI (urinary tract infection) due to urinary indwelling catheter ([place]) 11/09/2020  ? Pubic bone Osteomyelitis, bilaterally, 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  ? Hematuria 11/08/2020  ? Asthma, chronic, unspecified asthma severity, with acute exacerbation 11/08/2020  ? Edema 11/08/2020  ? Hydronephrosis of right kidney 07/17/2020  ? CKD (chronic kidney disease), stage IV ([place]) 04/08/2019  ? ? ?Patient's Medications  ?New Prescriptions  ? No medications on file  ?Previous Medications  ? ACETAMINOPHEN (TYLENOL) 500 MG TABLET    Take 500-1,000 mg by mouth every 6 (six) hours as needed for moderate pain or fever.  ? ALBUTEROL (VENTOLIN HFA) 108 (90 BASE) MCG/ACT INHALER    Inhale 1 puff into the lungs every 6 (six) hours as needed for shortness of breath.  ? ARIPIPRAZOLE (ABILIFY) 30 MG TABLET    Take 30 mg by mouth daily.  ? ASPIRIN EC 81 MG TABLET    Take 81 mg by mouth daily.  ? ATORVASTATIN (LIPITOR) 10 MG TABLET    Take 10 mg by mouth at bedtime.  ? B COMPLEX-C (B-COMPLEX WITH VITAMIN C) TABLET    Take 1 tablet by mouth daily.  ? BUSPIRONE (BUSPAR) Shelia.5 MG TABLET    Take Shelia.5 mg by mouth 2 (two) times daily as needed (anxiety).  ? CALCITRIOL (ROCALTROL) 0.25 MCG CAPSULE    Take 0.25 mcg by mouth daily.  ? CHOLECALCIFEROL (VITAMIN D-3) 25 MCG (1000 UT) CAPS    Take 2,000 Units by mouth daily.  ? DULOXETINE (CYMBALTA) 30 MG CAPSULE    Take 30 mg by mouth at bedtime.  ? FERROUS SULFATE 324  MG TBEC    Take 324 mg by mouth daily with breakfast.  ? FUROSEMIDE (LASIX) 40 MG TABLET    Take 40 mg by mouth daily.  ? HYDROCODONE-ACETAMINOPHEN (NORCO/VICODIN) 5-325 MG TABLET    Take 1 tablet by mouth every 8 hours as needed for pain  ? MEROPENEM (MERREM) IVPB    Inject 1 g into the vein every 12 (twelve) hours for 14 days. Indication:  Pubic osteo ?First Dose: Yes ?Last Day of Therapy:  05/07/21 ?Labs - Once weekly:  CBC/D and BMP, ?Labs - Every other week:  ESR and CRP ?Method of administration: Mini-Bag Plus / Gravity ?Method of administration may be changed at the discretion of home infusion pharmacist based upon assessment of the patient and/or caregiver's ability to self-administer the medication ordered.  ? METHOCARBAMOL (ROBAXIN) 500 MG TABLET    Take 500 mg by mouth 3 (three) times daily as needed for muscle spasms.  ? MULTIPLE VITAMIN (MULTIVITAMIN ADULT PO)    Take 1 tablet by mouth daily.  ? PANTOPRAZOLE (PROTONIX) 40 MG TABLET    Take 40 mg by mouth daily.  ? POTASSIUM CHLORIDE (KLOR-CON) 20 MEQ PACKET    Take 20 mEq by mouth daily.  ? PROBIOTIC PRODUCT (PROBIOTIC PO)    Take 1 capsule by mouth daily.  ?  VITAMIN C (ASCORBIC ACID) 500 MG TABLET    Take 500 mg by mouth 2 (two) times a week.  ?Modified Medications  ? No medications on file  ?Discontinued Medications  ? No medications on file  ? ? 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 Initially admitted to [facility] 2/26-3/9  for sepsis secondary to UTI.  Urine Cx+ PsA and  ESBL Ecoli. She was initially started on vancomycin and meropenem(ESBL Ecoli Hx) then transitioned to meropenem alone. MR showed. MR pelvis  w and w/o showed right greater trochanteric fracture, severe bone marrow edema  in b/l pubic bone concerning for osteomyelitis, concern for myositis throughout  adductor musclelature b/l. Pt underwent Bx of pubic bone with IR with cultures and pathology on 3/3 Discharged on meropenem x6 weeks and ID follow-up.She reports Hx treatment of  urethral cancer with chemotherapy and local radiation. ?Interval: Bacterial Cx negative.  ?03/29/21: She reports diarrhea continues from hospitalization. It has not worsened, she is taking probiotics. She denies fever, chills.  ?Interim: She was hospitalized 4/5-4/8 at [facility] with pelvic pain found to have subacute fracture of rt pubic bone, myositis and septic joint. Antibiotics extended another week. CRP 4.6  and ESR 46 significantly improved.  ?Today: She reports pubic bone tenderness has improved. Diarrhea has improved.  ?Review of Systems: ?Review of Systems  ?All other systems reviewed and are negative. ? ?Past Medical History:  ?Diagnosis Date  ? Arthritis   ? Asthma   ? Blood transfusion without reported diagnosis   ? Cancer [facility])   ? CHF (congestive heart failure) ([place])   ? COPD (chronic obstructive pulmonary disease) ([place])   ? Heart murmur   ? Immune deficiency disorder ([place])   ? Renal disorder   ? ? ?Social History  ? ?Tobacco Use  ? Smoking status: Some Days  ?  Packs/day: 1.00  ?  Years: 50.00  ?  Pack years: 50.00  ?  Types: Cigarettes, E-cigarettes  ?  Last attempt to quit: 01/21/2019  ?  Years since quitting: 2.2  ? Smokeless tobacco: Never  ? Tobacco comments:  ?  Quit smoking but vapes occasionally  ?Vaping Use  ? Vaping Use: Never used  ?Substance Use Topics  ? Alcohol use: Not Currently  ? Drug use: Yes  ?  Types: Marijuana  ?  Comment: occasional   ? ? ?Family History  ?Problem Relation Age of Onset  ? Heart disease Maternal Grandfather   ? ? ?Allergies  ?Allergen Reactions  ? Metoprolol Shortness Of Breath  ? ? ?Health Maintenance  ?Topic Date Due  ? COVID-19 Vaccine (1) Never done  ? Hepatitis C Screening  Never done  ? TETANUS/TDAP  Never done  ? Zoster Vaccines- Shingrix (1 of 2) Never done  ? COLONOSCOPY (Pts 45-76yr  Insurance coverage will need to be confirmed)  Never done  ? MAMMOGRAM  Never done  ? DEXA SCAN  Never done  ? Pneumonia Vaccine 67 Years old (2 - PCV) 04/09/2020  ? INFLUENZA VACCINE  08/17/2021  ? HPV VACCINES  Aged Out  ? ? ?Objective: ? ?Vitals:  ? 04/30/21 1513  ?BP: 93/60  ?Pulse: 76  ?Temp: 98.4 ?F (36.9 ?C)  ?TempSrc: Oral  ?SpO2: 97%  ?Weight: 131 lb 6.4 oz (59.6 kg)  ? ?Body mass index is 24.83 kg/m?. ? ?Physical Exam ?Constitutional:   ?  Appearance: Normal appearance.  ?HENT:  ?   Head: Normocephalic and atraumatic.  ?   Right Ear: Tympanic membrane normal.  ?   Left Ear: Tympanic membrane normal.  ?   Nose: Nose normal.  ?   Mouth/Throat:  ?   Mouth: Mucous membranes are moist.  ?Eyes:  ?   Extraocular Movements: Extraocular movements intact.  ?   Conjunctiva/sclera: Conjunctivae normal.  ?   Pupils: Pupils are equal, round, and reactive to light.  ?Cardiovascular:  ?   Rate and Rhythm: Normal rate and regular rhythm.  ?   Heart sounds: No murmur heard. ?  No friction rub. No gallop.  ?Pulmonary:  ?   Effort: Pulmonary effort is normal.  ?   Breath sounds: Normal breath sounds.  ?Abdominal:  ?   General: Abdomen is flat.  ?   Palpations: Abdomen is soft.  ?Musculoskeletal:     ?   General: Normal range of motion.  ?Skin: ?   General: Skin is warm and dry.  ?Neurological:  ?   General: No focal deficit present.  ?   Mental Status: She is alert and oriented to person, place, and time.  ?Psychiatric:     ?   Mood and Affect: Mood normal.  ? ? ?Lab Results ?Lab Results  ?Component Value Date  ? WBC 6.6 04/24/2021  ? HGB 8.6 (L) 04/24/2021  ? HCT 26.1 (L) 04/24/2021  ? MCV 85.0 04/24/2021  ? PLT 512 (H) 04/24/2021  ?  ?Lab Results  ?Component Value Date  ? CREATININE 1.36 (H) 04/24/2021  ? BUN 25 (H) 04/24/2021  ? NA 137 04/24/2021  ? K 2.9 (L) 04/24/2021  ? CL 117 (H) 04/24/2021  ? CO2 15 (L) 04/24/2021  ?  ?Lab Results  ?Component Value Date  ? ALT 14 04/20/2021  ? AST 18 04/20/2021  ? ALKPHOS 156 (H)  04/20/2021  ? BILITOT 0.1 (L) 04/20/2021  ?  ?No results found for: CHOL, HDL, LDLCALC, LDLDIRECT, TRIG, CHOLHDL ?Lab Results  ?Component Value Date  ? LABRPR NON REACTIVE 03/17/2021  ? ?No results found for: H

## 2021-05-03 ENCOUNTER — Telehealth: Payer: Self-pay | Admitting: Cardiovascular Disease

## 2021-05-03 ENCOUNTER — Other Ambulatory Visit (HOSPITAL_COMMUNITY): Payer: Self-pay

## 2021-05-03 MED ORDER — HYDROCODONE-ACETAMINOPHEN 5-325 MG PO TABS
ORAL_TABLET | ORAL | 0 refills | Status: DC
  Filled 2021-05-03: qty 30, 10d supply, fill #0

## 2021-05-03 NOTE — Telephone Encounter (Signed)
Angel from [facility] returned my call. She stated patient's labs after hospital discharge on 4/10 was 3.8. I explained we do not have those results. She said she was only faxing results to [facility]. I asked her to fax all of her future lab results to Dr. Audie Box. I gave her [facility] fax number. She also stated patient stated told her that she was not going to the ED. She apologized for the confusion. ?

## 2021-05-03 NOTE — Telephone Encounter (Signed)
?  Pt c/o swelling: STAT is pt has developed SOB within 24 hours ? ?How much weight have you gained and in what time span? 5.5 lbs since 4/10 ? ?If swelling, where is the swelling located? Legs, ankles and feet ? ?Are you currently taking a fluid pill? yes ? ?Are you currently SOB? no ? ?Do you have a log of your daily weights (if so, list)? no ? ?Have you gained 3 pounds in a day or 5 pounds in a week? yes ? ?Have you traveled recently? No ? ? ?Patient is extremely week  ?

## 2021-05-03 NOTE — Telephone Encounter (Signed)
Angel with [facility] is following up. ?

## 2021-05-03 NOTE — Telephone Encounter (Signed)
LMTCB for Angle from [facility]. Received new cell number for patient [phone]. Spoke with patient to inform her of Dr. Kathalene Frames recommendation... "She needs to stop lasix until her potassium is normalized. If she is gaining this much fluid and potassium is this low, would recommend ER evaluation to expedite labs and recommendations for lasix." Patient asked if she could take a double dose of potassium. I explained why she should not take 2 doses and that she should be evaluated at the ED. She hung up on me. I called her back and it went to voicemail.  ?

## 2021-05-03 NOTE — Telephone Encounter (Signed)
Spoke with Angle from [facility] who reported patient has gained 5.5 pounds over the past week and has increased weakness. There is swelling in legs feet. Patient does not wear support stockings. Girth size has not changed. Patient is eating better. Labs drawn today and will be dropped off at lab. Last K+ was 2.9 on 4/8. Please advise on edema and K+. ?

## 2021-05-04 NOTE — Telephone Encounter (Signed)
Spoke with Angle from [facility] and gave her Dr. Kathalene Frames lasix and potassium orders.Marland KitchenMarland Kitchen"If potassium is normal, she can take her lasix BID and potassium BID for 3 days, and then return to daily. We need to make sure the potassium is above 3.5 before we do this." ?Angle voiced understanding and stated she will check potassium lab values today. ?

## 2021-05-05 LAB — ACID FAST CULTURE WITH REFLEXED SENSITIVITIES (MYCOBACTERIA): Acid Fast Culture: NEGATIVE

## 2021-05-20 ENCOUNTER — Other Ambulatory Visit (HOSPITAL_COMMUNITY): Payer: Self-pay | Admitting: Interventional Radiology

## 2021-05-20 ENCOUNTER — Other Ambulatory Visit: Payer: Self-pay | Admitting: Family

## 2021-05-20 ENCOUNTER — Telehealth: Payer: Self-pay

## 2021-05-20 DIAGNOSIS — M8618 Other acute osteomyelitis, other site: Secondary | ICD-10-CM

## 2021-05-20 NOTE — Telephone Encounter (Signed)
Left voicemail with [name] to schedule port removal. Will call patient once appointment is scheduled.  ?[name], RMA  ?

## 2021-05-20 NOTE — Telephone Encounter (Signed)
Was able to get patient scheduled at [facility]. Is scheduled for tomorrow at 3. Patient is okay with appointment date/time. ?[name], RMA  ? ?

## 2021-05-20 NOTE — Telephone Encounter (Signed)
Patient called office stating she has completed antibiotics and would like to know when she can have port removed. Per note patient end date was 4/21. Will forward message to MD. ?[name], RMA  ?

## 2021-05-20 NOTE — Progress Notes (Signed)
Received notification Ms. Kwasny completed treatment on 4/21 and has a port remaining that needs to be removed. Based on chart review there is no further indication for the port and will place order for removal with IR.  ? ?Terri Piedra, NP ?05/20/2021 ?10:40 AM ? ?

## 2021-05-21 ENCOUNTER — Other Ambulatory Visit (HOSPITAL_COMMUNITY): Payer: Self-pay

## 2021-05-21 ENCOUNTER — Other Ambulatory Visit (HOSPITAL_COMMUNITY): Payer: Medicare Other

## 2021-05-21 MED ORDER — HYDROCODONE-ACETAMINOPHEN 5-325 MG PO TABS
ORAL_TABLET | ORAL | 0 refills | Status: DC
  Filled 2021-05-21: qty 30, 10d supply, fill #0

## 2021-05-21 NOTE — Telephone Encounter (Signed)
Patient called and said her pcp would like for her to hold off on having port removed until she has xray done. Is going today to [facility].  ?Will cancel IR appointment per patient's request.  ?[name], RMA  ?

## 2021-05-24 ENCOUNTER — Encounter: Payer: Self-pay | Admitting: Internal Medicine

## 2021-05-24 ENCOUNTER — Telehealth: Payer: Self-pay

## 2021-05-24 NOTE — Progress Notes (Signed)
4/24 labs: wbc 8.3, Scr 1.73 ?

## 2021-05-24 NOTE — Telephone Encounter (Signed)
Patient called stating her pcp will be sending her for a xray of her pelvis. She stated her pcp wanted to be sure that her infection was gone.  ?Shelia Chan Shelia Chan ? ?

## 2021-05-25 ENCOUNTER — Other Ambulatory Visit: Payer: Self-pay | Admitting: Family Medicine

## 2021-05-25 ENCOUNTER — Ambulatory Visit
Admission: RE | Admit: 2021-05-25 | Discharge: 2021-05-25 | Disposition: A | Discharge status: Home / Self Care | Payer: Medicare Other | Source: Ambulatory Visit | Attending: Family Medicine | Admitting: Family Medicine

## 2021-05-25 DIAGNOSIS — L403 Pustulosis palmaris et plantaris: Secondary | ICD-10-CM

## 2021-05-25 DIAGNOSIS — M25552 Pain in left hip: Secondary | ICD-10-CM

## 2021-05-25 DIAGNOSIS — M25551 Pain in right hip: Secondary | ICD-10-CM

## 2021-05-27 NOTE — Telephone Encounter (Signed)
Spoke with Dr. Candiss Norse who advised patient have this removed.  ?Will call IR to schedule appointment.  ?Appointment currently scheduled for Thursday at [facility] 8 AM. ?Will need to confirm with patient if okay to schedule appt with [facility] for earlier date (5/16).  ?[name], RMA  ?

## 2021-05-27 NOTE — Telephone Encounter (Signed)
Was able to connect with patient regarding appointment at IR to have Tunneled CVC removed. Patient would prefer to stay with Shelia Chan and have tunneled picc  removed same day as stent replacement.  ?Updated IR on plan who will be able to do this for patient.  ?Relayed verbal order to Abigail at [facility] to continue  ?

## 2021-05-31 ENCOUNTER — Other Ambulatory Visit: Payer: Self-pay

## 2021-05-31 ENCOUNTER — Ambulatory Visit (INDEPENDENT_AMBULATORY_CARE_PROVIDER_SITE_OTHER): Payer: Medicare Other | Admitting: Internal Medicine

## 2021-05-31 ENCOUNTER — Encounter: Payer: Self-pay | Admitting: Internal Medicine

## 2021-05-31 VITALS — BP 96/63 | HR 63 | Temp 97.4°F | Wt 127.6 lb

## 2021-05-31 DIAGNOSIS — M868X8 Other osteomyelitis, other site: Secondary | ICD-10-CM

## 2021-05-31 NOTE — Patient Instructions (Signed)
[facility]   ?[address] [place], [place] [address]   ?[phone]    ? ?

## 2021-05-31 NOTE — Addendum Note (Signed)
Addended byLaurice Record on: 05/31/2021 04:22 PM ? ? Modules accepted: Orders ? ?

## 2021-05-31 NOTE — Progress Notes (Signed)
? ?   ? ? ? ? ?Patient Active Problem List  ? Diagnosis Date Noted  ? Pubic bone fracture ([place]) 04/22/2021  ?  Class: Acute  ? Septic arthritis ([place])   ? Hypokalemia 04/21/2021  ? Vaginal bleeding   ? 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 Thrombocytosis 03/23/2021  ? Greater trochanter fracture ([place]) 03/17/2021  ? Vaginal discharge 03/14/2021  ? COPD (chronic obstructive pulmonary disease) ([place])   ? Chronic diastolic CHF (congestive heart failure) ([place])   ? Depression with anxiety   ? UTI (urinary tract infection) due to urinary indwelling catheter ([place]) 11/09/2020  ? Pubic bone Osteomyelitis, bilaterally, 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  ? Hematuria 11/08/2020  ? Asthma, chronic, unspecified asthma severity, with acute exacerbation 11/08/2020  ? Edema 11/08/2020  ? Hydronephrosis of right kidney 07/17/2020  ? CKD (chronic kidney disease), stage IV ([address]) 04/08/2019  ? ? ?Patient's Medications  ?New Prescriptions  ? No medications on file  ?Previous Medications  ? ACETAMINOPHEN (TYLENOL) 500 MG TABLET    Take 500-1,000 mg by mouth every 6 (six) hours as needed for moderate pain or fever.  ? ALBUTEROL (VENTOLIN HFA) 108 (90 BASE) MCG/ACT INHALER    Inhale 1 puff into the lungs every 6 (six) hours as needed for shortness of breath.  ? ARIPIPRAZOLE (ABILIFY) 30 MG TABLET    Take 30 mg by mouth daily.  ? ASPIRIN EC 81 MG TABLET    Take 81 mg by mouth daily.  ? ATORVASTATIN (LIPITOR) 10 MG TABLET    Take 10 mg by mouth at bedtime.  ? B COMPLEX-C (B-COMPLEX WITH VITAMIN C) TABLET    Take 1 tablet by mouth daily.  ? BUSPIRONE (BUSPAR) 7.5 MG TABLET    Take 7.5 mg by mouth 2 (two) times daily as needed (anxiety).  ? CALCITRIOL (ROCALTROL) 0.25 MCG CAPSULE    Take 0.25 mcg by mouth daily.  ? CHOLECALCIFEROL (VITAMIN D-3) 25 MCG (1000 UT) CAPS    Take 2,000 Units by mouth daily.  ? DULOXETINE (CYMBALTA) 30 MG CAPSULE    Take 30 mg by mouth at bedtime.  ? FERROUS SULFATE 324  MG TBEC    Take 324 mg by mouth daily with breakfast.  ? FUROSEMIDE (LASIX) 40 MG TABLET    Take 40 mg by mouth daily.  ? HYDROCODONE-ACETAMINOPHEN (NORCO/VICODIN) 5-325 MG TABLET    Take 1 tablet by mouth every 8 hours as needed for pain  ? METHOCARBAMOL (ROBAXIN) 500 MG TABLET    Take 500 mg by mouth 3 (three) times daily as needed for muscle spasms.  ? MULTIPLE VITAMIN (MULTIVITAMIN ADULT PO)    Take 1 tablet by mouth daily.  ? PANTOPRAZOLE (PROTONIX) 40 MG TABLET    Take 40 mg by mouth daily.  ? POTASSIUM CHLORIDE (KLOR-CON) 20 MEQ PACKET    Take 20 mEq by mouth daily.  ? PROBIOTIC PRODUCT (PROBIOTIC PO)    Take 1 capsule by mouth daily.  ? VITAMIN C (ASCORBIC ACID) 500 MG TABLET    Take 500 mg by mouth 2 (two) times a week.  ?Modified Medications  ? No medications on file  ?Discontinued Medications  ? No medications on file  ? ? 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 Initially admitted to [facility] 2/26-3/9  for sepsis secondary to UTI.  Urine  Cx+ PsA and  ESBL Ecoli. She was initially started on vancomycin and meropenem(ESBL Ecoli Hx) then transitioned to meropenem alone. MR showed. MR pelvis  w and w/o showed right greater trochanteric fracture, severe bone marrow edema  in b/l pubic bone concerning for osteomyelitis, concern for myositis throughout adductor musclelature b/l. Pt underwent Bx of pubic bone with IR with cultures and pathology on 3/3 Discharged on meropenem x6 weeks and ID follow-up.She reports Hx treatment of  urethral cancer with chemotherapy and local radiation. ?Interval: Bacterial Cx negative.  ?03/29/21: She reports diarrhea continues from hospitalization. It has not worsened, she is taking probiotics. She denies fever, chills.  ?Interim: She was hospitalized 4/5-4/8 at [facility] with pelvic pain found to have subacute  fracture of rt pubic bone, myositis and septic joint. Antibiotics extended another week. CRP 4.6  and ESR 46 significantly improved.  ?04/30/21: She reports pubic bone tenderness has improved. Diarrhea has improved.  ?05/31/21: Reports pubic pain has improved but not resolved. She states she continues to have vaginal discharge, yellow/brown in color requiring pullups. She denies fever, chills, N/V/D. She missed her ortho appt with Sophronia Simas at [facility].  ? ?Review of Systems: ?Review of Systems  ?All other systems reviewed and are negative. ? ?Past Medical History:  ?Diagnosis Date  ? Arthritis   ? Asthma   ? Blood transfusion without reported diagnosis   ? Cancer [facility])   ? CHF (congestive heart failure) ([place])   ? COPD (chronic obstructive pulmonary disease) ([place])   ? Heart murmur   ? Immune deficiency disorder ([place])   ? Renal disorder   ? ? ?Social History  ? ?Tobacco Use  ? Smoking status: Some Days  ?  Packs/day: 1.00  ?  Years: 50.00  ?  Pack years: 50.00  ?  Types: Cigarettes, E-cigarettes  ?  Last attempt to quit: 01/21/2019  ?  Years since quitting: 2.3  ? Smokeless tobacco: Never  ? Tobacco comments:  ?  Quit smoking but vapes occasionally  ?Vaping Use  ? Vaping Use: Never used  ?Substance Use Topics  ? Alcohol use: Not Currently  ? Drug use: Yes  ?  Types: Marijuana  ?  Comment: occasional   ? ? ?Family History  ?Problem Relation Age of Onset  ? Heart disease Maternal Grandfather   ? ? ?Allergies  ?Allergen Reactions  ? Metoprolol Shortness Of Breath  ? Shellfish-Derived Products Other (See Comments)  ? ? ?Health Maintenance  ?Topic Date Due  ? COVID-19 Vaccine (1) Never done  ? Hepatitis C Screening  Never done  ? TETANUS/TDAP  Never done  ? Zoster Vaccines- Shingrix (1 of 2) Never done  ? COLONOSCOPY (Pts 45-39yr Insurance coverage will need to be confirmed)  Never done  ? MAMMOGRAM  Never done  ? DEXA SCAN  Never done  ? Pneumonia Vaccine 67 Years old (2 - PCV) 04/09/2020  ? INFLUENZA VACCINE  08/17/2021  ?  HPV VACCINES  Aged Out  ? ? ?Objective: ? ?Vitals:  ? 05/31/21 1334  ?BP: 96/63  ?Pulse: 63  ?Temp: (!) 97.4 ?F (36.3 ?C)  ?TempSrc: Oral  ?SpO2: 100%  ?Weight: 127 lb 9.6 oz (57.9 kg)  ? ?Body mass index is 24.11 kg/m?. ? ?Physical Exam ?Constitutional:   ?   Appearance: Normal appearance.  ?HENT:  ?   Head: Normocephalic and atraumatic.  ?   Right Ear: Tympanic membrane normal.  ?   Left Ear: Tympanic membrane normal.  ?   Nose:  Nose normal.  ?   Mouth/Throat:  ?   Mouth: Mucous membranes are moist.  ?Eyes:  ?   Extraocular Movements: Extraocular movements intact.  ?   Conjunctiva/sclera: Conjunctivae normal.  ?   Pupils: Pupils are equal, round, and reactive to light.  ?Cardiovascular:  ?   Rate and Rhythm: Normal rate and regular rhythm.  ?   Heart sounds: No murmur heard. ?  No friction rub. No gallop.  ?   Comments: Rt port  ?Pulmonary:  ?   Effort: Pulmonary effort is normal.  ?   Breath sounds: Normal breath sounds.  ?Abdominal:  ?   General: Abdomen is flat.  ?   Palpations: Abdomen is soft.  ?Musculoskeletal:     ?   General: Normal range of motion.  ?Skin: ?   General: Skin is warm and dry.  ?Neurological:  ?   General: No focal deficit present.  ?   Mental Status: She is alert and oriented to person, place, and time.  ?Psychiatric:     ?   Mood and Affect: Mood normal.  ? ? ?Lab Results ?Lab Results  ?Component Value Date  ? WBC 6.6 04/24/2021  ? HGB 8.6 (L) 04/24/2021  ? HCT 26.1 (L) 04/24/2021  ? MCV 85.0 04/24/2021  ? PLT 512 (H) 04/24/2021  ?  ?Lab Results  ?Component Value Date  ? CREATININE 1.36 (H) 04/24/2021  ? BUN 25 (H) 04/24/2021  ? NA 137 04/24/2021  ? K 2.9 (L) 04/24/2021  ? CL 117 (H) 04/24/2021  ? CO2 15 (L) 04/24/2021  ?  ?Lab Results  ?Component Value Date  ? ALT 14 04/20/2021  ? AST 18 04/20/2021  ? ALKPHOS 156 (H) 04/20/2021  ? BILITOT 0.1 (L) 04/20/2021  ?  ?No results found for: CHOL, HDL, LDLCALC, LDLDIRECT, TRIG, CHOLHDL ?Lab Results  ?Component Value Date  ? LABRPR NON REACTIVE  03/17/2021  ? ?No results found for: HIV1RNAQUANT, HIV1RNAVL, CD4TABS ?  ?A/P ?#Pubic osteomyelitis and myositis SP Bx of pubic bone on 3/3 ?-Bone Bx showed osteomyelitis with  negative Cx ?-Pt re-hospit

## 2021-06-01 LAB — COMPLETE METABOLIC PANEL WITH GFR
AG Ratio: 1 (calc) (ref 1.0–2.5)
ALT: 4 U/L — ABNORMAL LOW (ref 6–29)
AST: 8 U/L — ABNORMAL LOW (ref 10–35)
Albumin: 3.9 g/dL (ref 3.6–5.1)
Alkaline phosphatase (APISO): 126 U/L (ref 37–153)
BUN/Creatinine Ratio: 20 (calc) (ref 6–22)
BUN: 39 mg/dL — ABNORMAL HIGH (ref 7–25)
CO2: 21 mmol/L (ref 20–32)
Calcium: 9.5 mg/dL (ref 8.6–10.4)
Chloride: 103 mmol/L (ref 98–110)
Creat: 1.94 mg/dL — ABNORMAL HIGH (ref 0.50–1.05)
Globulin: 4.1 g/dL (calc) — ABNORMAL HIGH (ref 1.9–3.7)
Glucose, Bld: 81 mg/dL (ref 65–99)
Potassium: 4.5 mmol/L (ref 3.5–5.3)
Sodium: 137 mmol/L (ref 135–146)
Total Bilirubin: 0.3 mg/dL (ref 0.2–1.2)
Total Protein: 8 g/dL (ref 6.1–8.1)
eGFR: 28 mL/min/{1.73_m2} — ABNORMAL LOW (ref >=60)

## 2021-06-01 LAB — CBC WITH DIFFERENTIAL/PLATELET
Absolute Monocytes: 600 cells/uL (ref 200–950)
Basophils Absolute: 87 cells/uL (ref 0–200)
Basophils Relative: 0.8 %
Eosinophils Absolute: 142 cells/uL (ref 15–500)
Eosinophils Relative: 1.3 %
HCT: 30.2 % — ABNORMAL LOW (ref 35.0–45.0)
Hemoglobin: 9.7 g/dL — ABNORMAL LOW (ref 11.7–15.5)
Lymphs Abs: 2333 cells/uL (ref 850–3900)
MCH: 27.6 pg (ref 27.0–33.0)
MCHC: 32.1 g/dL (ref 32.0–36.0)
MCV: 85.8 fL (ref 80.0–100.0)
MPV: 9.2 fL (ref 7.5–12.5)
Monocytes Relative: 5.5 %
Neutro Abs: 7739 cells/uL (ref 1500–7800)
Neutrophils Relative %: 71 %
Platelets: 856 10*3/uL — ABNORMAL HIGH (ref 140–400)
RBC: 3.52 10*6/uL — ABNORMAL LOW (ref 3.80–5.10)
RDW: 16.5 % — ABNORMAL HIGH (ref 11.0–15.0)
Total Lymphocyte: 21.4 %
WBC: 10.9 10*3/uL — ABNORMAL HIGH (ref 3.8–10.8)

## 2021-06-01 LAB — SEDIMENTATION RATE: Sed Rate: 72 mm/h — ABNORMAL HIGH (ref 0–30)

## 2021-06-01 LAB — C-REACTIVE PROTEIN: CRP: 33.9 mg/L — ABNORMAL HIGH (ref <8.0)

## 2021-06-02 ENCOUNTER — Other Ambulatory Visit (HOSPITAL_COMMUNITY): Payer: Self-pay

## 2021-06-02 MED ORDER — HYDROCODONE-ACETAMINOPHEN 5-325 MG PO TABS
ORAL_TABLET | ORAL | 0 refills | Status: DC
  Filled 2021-06-02: qty 30, 10d supply, fill #0

## 2021-06-03 ENCOUNTER — Other Ambulatory Visit (HOSPITAL_COMMUNITY): Payer: Medicare Other

## 2021-06-04 ENCOUNTER — Other Ambulatory Visit: Payer: Self-pay | Admitting: Family

## 2021-06-04 ENCOUNTER — Ambulatory Visit (HOSPITAL_COMMUNITY)
Admission: RE | Admit: 2021-06-04 | Discharge: 2021-06-04 | Disposition: A | Discharge status: Home / Self Care | Payer: Medicare Other | Source: Ambulatory Visit | Attending: Interventional Radiology | Admitting: Interventional Radiology

## 2021-06-04 ENCOUNTER — Ambulatory Visit (HOSPITAL_COMMUNITY)
Admission: RE | Admit: 2021-06-04 | Discharge: 2021-06-04 | Disposition: A | Discharge status: Home / Self Care | Payer: Medicare Other | Source: Ambulatory Visit | Attending: Family | Admitting: Family

## 2021-06-04 DIAGNOSIS — Z906 Acquired absence of other parts of urinary tract: Secondary | ICD-10-CM | POA: Insufficient documentation

## 2021-06-04 DIAGNOSIS — M8618 Other acute osteomyelitis, other site: Secondary | ICD-10-CM

## 2021-06-04 DIAGNOSIS — Z8551 Personal history of malignant neoplasm of bladder: Secondary | ICD-10-CM

## 2021-06-04 HISTORY — PX: IR EXT NEPHROURETERAL CATH EXCHANGE: IMG5418

## 2021-06-04 HISTORY — PX: IR REMOVAL TUN CV CATH W/O FL: IMG2289

## 2021-06-04 MED ORDER — IOHEXOL 300 MG/ML  SOLN
50.0000 mL | Freq: Once | INTRAMUSCULAR | Status: AC | PRN
Start: 2021-06-04 — End: 2021-06-04
  Administered 2021-06-04: 15 mL

## 2021-06-04 MED ORDER — LIDOCAINE HCL 1 % IJ SOLN
INTRAMUSCULAR | Status: AC
  Filled 2021-06-04: qty 20

## 2021-06-04 NOTE — Procedures (Signed)
Interventional Radiology Procedure:   Indications: Needs routine exchange of nephroureteral catheters and removal of right chest tunneled central line  Procedure: 1) Removal of tunneled central line 2) Exchange of bilateral nephroureteral catheters  Findings: New bilateral 12 Fr drains placed.  Central line removed with traction.   Complications: No immediate complications noted.     EBL: Minimal  Plan: Routine exchange of bilateral nephroureteral catheters.   Murad Staples R. Anselm Pancoast, MD  Pager: [phone]

## 2021-06-07 ENCOUNTER — Other Ambulatory Visit (HOSPITAL_COMMUNITY): Payer: Self-pay | Admitting: Interventional Radiology

## 2021-06-08 ENCOUNTER — Encounter: Payer: Self-pay | Admitting: Obstetrics

## 2021-06-08 ENCOUNTER — Other Ambulatory Visit (HOSPITAL_COMMUNITY)
Admission: RE | Admit: 2021-06-08 | Discharge: 2021-06-08 | Disposition: A | Discharge status: Home / Self Care | Payer: Medicare Other | Source: Ambulatory Visit | Attending: Obstetrics | Admitting: Obstetrics

## 2021-06-08 ENCOUNTER — Ambulatory Visit (INDEPENDENT_AMBULATORY_CARE_PROVIDER_SITE_OTHER): Payer: Medicare Other | Admitting: Obstetrics

## 2021-06-08 VITALS — BP 110/70 | HR 110 | Wt 120.0 lb

## 2021-06-08 DIAGNOSIS — Z90710 Acquired absence of both cervix and uterus: Secondary | ICD-10-CM | POA: Diagnosis not present

## 2021-06-08 DIAGNOSIS — N898 Other specified noninflammatory disorders of vagina: Secondary | ICD-10-CM

## 2021-06-08 DIAGNOSIS — C679 Malignant neoplasm of bladder, unspecified: Secondary | ICD-10-CM | POA: Diagnosis not present

## 2021-06-08 DIAGNOSIS — Z9889 Other specified postprocedural states: Secondary | ICD-10-CM | POA: Diagnosis not present

## 2021-06-08 DIAGNOSIS — N828 Other female genital tract fistulae: Secondary | ICD-10-CM

## 2021-06-08 NOTE — Progress Notes (Signed)
Patient ID: Shelia Chan, female   DOB: [birth year], 66 y.o.   MRN: [id number]  Chief Complaint  Patient presents with   New Patient (Initial Visit)   Vaginitis    HPI Shelia Chan is a 66 y.o. female.  Blood tinged vaginal discharge pelvic pain starting 3 weeks ago.  History of TAH in ~ 1998 for AUB and dysmenorrhea.  Bladder and urethral cancer diagnosed in 2015, with subsequent resection of bladder and urethra, chemo and radiation and placement of ureteral stents.  She states that her vagina was obliterated from the surgery and radiation, and she has not been able to tolerate a gynecologic speculum exam since then.  HPI  Past Medical History:  Diagnosis Date   Arthritis    Asthma    Blood transfusion without reported diagnosis    Cancer ([place])    CHF (congestive heart failure) ([facility])    COPD (chronic obstructive pulmonary disease) ([facility])    Heart murmur    Immune deficiency disorder ([facility])    Renal disorder     Past Surgical History:  Procedure Laterality Date   BLADDER REMOVAL  118/22/2015   FRACTURE SURGERY Left 02/2018   pins   IR CATHETER TUBE CHANGE  04/15/2019   IR CATHETER TUBE CHANGE  04/15/2019   IR EXT NEPHROURETERAL CATH EXCHANGE  07/15/2019   IR EXT NEPHROURETERAL CATH EXCHANGE  10/14/2019   IR EXT NEPHROURETERAL CATH EXCHANGE  10/14/2019   IR EXT NEPHROURETERAL CATH EXCHANGE  01/06/2020   IR EXT NEPHROURETERAL CATH EXCHANGE  03/16/2020   IR EXT NEPHROURETERAL CATH EXCHANGE  05/25/2020   IR EXT NEPHROURETERAL CATH EXCHANGE  07/17/2020   IR EXT NEPHROURETERAL CATH EXCHANGE  07/17/2020   IR EXT NEPHROURETERAL CATH EXCHANGE  08/14/2020   IR EXT NEPHROURETERAL CATH EXCHANGE  09/25/2020   IR EXT NEPHROURETERAL CATH EXCHANGE  11/06/2020   IR EXT NEPHROURETERAL CATH EXCHANGE  12/25/2020   IR EXT NEPHROURETERAL CATH EXCHANGE  02/09/2021   IR EXT NEPHROURETERAL CATH EXCHANGE  03/19/2021   IR EXT NEPHROURETERAL CATH EXCHANGE  03/19/2021   IR EXT NEPHROURETERAL CATH EXCHANGE  04/23/2021   IR  EXT NEPHROURETERAL CATH EXCHANGE  04/23/2021   IR EXT NEPHROURETERAL CATH EXCHANGE  06/04/2021   IR PERC TUN PERIT CATH WO PORT S&I /IMAG  03/25/2021   IR REMOVAL TUN CV CATH W/O FL  06/04/2021    Family History  Problem Relation Age of Onset   Heart disease Maternal Grandfather     Social History Social History   Tobacco Use   Smoking status: Some Days    Packs/day: 1.00    Years: 50.00    Pack years: 50.00    Types: Cigarettes, E-cigarettes    Last attempt to quit: 01/21/2019    Years since quitting: 2.3   Smokeless tobacco: Never   Tobacco comments:    Quit smoking but vapes occasionally  Vaping Use   Vaping Use: Never used  Substance Use Topics   Alcohol use: Not Currently   Drug use: Yes    Types: Marijuana    Comment: occasional     Allergies  Allergen Reactions   Metoprolol Shortness Of Breath   Shellfish-Derived Products Other (See Comments)    Current Outpatient Medications  Medication Sig Dispense Refill   acetaminophen (TYLENOL) 500 MG tablet Take 500-1,000 mg by mouth every 6 (six) hours as needed for moderate pain or fever.     albuterol (VENTOLIN HFA) 108 (90 Base) MCG/ACT inhaler Inhale 1 puff into  the lungs every 6 (six) hours as needed for shortness of breath.     ARIPiprazole (ABILIFY) 30 MG tablet Take 30 mg by mouth daily.     aspirin EC 81 MG tablet Take 81 mg by mouth daily.     atorvastatin (LIPITOR) 10 MG tablet Take 10 mg by mouth at bedtime.     B Complex-C (B-COMPLEX WITH VITAMIN C) tablet Take 1 tablet by mouth daily.     busPIRone (BUSPAR) 7.5 MG tablet Take 7.5 mg by mouth 2 (two) times daily as needed (anxiety).     calcitRIOL (ROCALTROL) 0.25 MCG capsule Take 0.25 mcg by mouth daily.     Cholecalciferol (VITAMIN D-3) 25 MCG (1000 UT) CAPS Take 2,000 Units by mouth daily.     DULoxetine (CYMBALTA) 30 MG capsule Take 30 mg by mouth at bedtime.     ferrous sulfate 324 MG TBEC Take 324 mg by mouth daily with breakfast.     furosemide (LASIX) 40  MG tablet Take 40 mg by mouth daily.     HYDROcodone-acetaminophen (NORCO/VICODIN) 5-325 MG tablet Take 1 tablet by mouth every 8 hours as needed for pain 30 tablet 0   methocarbamol (ROBAXIN) 500 MG tablet Take 500 mg by mouth 3 (three) times daily as needed for muscle spasms.     Multiple Vitamin (MULTIVITAMIN ADULT PO) Take 1 tablet by mouth daily.     pantoprazole (PROTONIX) 40 MG tablet Take 40 mg by mouth daily.     potassium chloride (KLOR-CON) 20 MEQ packet Take 20 mEq by mouth daily.     Probiotic Product (PROBIOTIC PO) Take 1 capsule by mouth daily.     vitamin C (ASCORBIC ACID) 500 MG tablet Take 500 mg by mouth 2 (two) times a week.     No current facility-administered medications for this visit.    Review of Systems Review of Systems Constitutional: negative for fatigue and weight loss Respiratory: negative for cough and wheezing Cardiovascular: negative for chest pain, fatigue and palpitations Gastrointestinal: negative for abdominal pain and change in bowel habits Genitourinary:positive for bloody vaginal discharge and pelvic pain Integument/breast: negative for nipple discharge Musculoskeletal:negative for myalgias Neurological: negative for gait problems and tremors Behavioral/Psych: negative for abusive relationship, depression Endocrine: negative for temperature intolerance      Blood pressure 110/70, pulse (!) 110, weight 120 lb (54.4 kg).  Physical Exam Physical Exam General:   Alert and no distress  Skin:   no rash or abnormalities  Lungs:   clear to auscultation bilaterally  Heart:   regular rate and rhythm, S1, S2 normal, no murmur, click, rub or gallop  Breasts:   normal without suspicious masses, skin or nipple changes or axillary nodes  Abdomen:  Suprapubic nephroureteral cath exchange with clear urine  Pelvic:  Patient refused speculum exam because of previous surgery and intolerance of speculum exam.  Was able to digitally enter ~ 1-2 inches into the  vagina, and there was no active bleeding noted on gloved finger.   I have spent a total of 30 minutes of face-to-face time, excluding clinical staff time, reviewing notes and preparing to see patient, ordering tests and/or medications, and counseling the patient.   Data Reviewed Office notes Labs Radiology  Assessment     1. Vaginal discharge Rx: - Cervicovaginal ancillary only( [place]) - Wound culture  2. S/P TAH (total abdominal hysterectomy)  3. Malignant neoplasm of urinary bladder, unspecified site ([place])  4. Status post surgical removal and fulguration of bladder  neoplasm, radiation and chemotherapy - S/P Nephroureteral Cath Exchange  5. 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

## 2021-06-09 LAB — CERVICOVAGINAL ANCILLARY ONLY
Bacterial Vaginitis (gardnerella): NEGATIVE
Candida Glabrata: NEGATIVE
Candida Vaginitis: NEGATIVE
Comment: NEGATIVE
Comment: NEGATIVE
Comment: NEGATIVE
Comment: NEGATIVE
Trichomonas: NEGATIVE

## 2021-06-11 LAB — WOUND CULTURE

## 2021-06-16 ENCOUNTER — Other Ambulatory Visit (HOSPITAL_COMMUNITY): Payer: Self-pay

## 2021-06-16 MED ORDER — HYDROCODONE-ACETAMINOPHEN 5-325 MG PO TABS
1.0000 | ORAL_TABLET | Freq: Three times a day (TID) | ORAL | 0 refills | Status: DC | PRN
  Filled 2021-06-16: qty 21, 7d supply, fill #0

## 2021-06-24 ENCOUNTER — Other Ambulatory Visit (HOSPITAL_COMMUNITY): Payer: Self-pay | Admitting: Radiology

## 2021-06-24 DIAGNOSIS — C680 Malignant neoplasm of urethra: Secondary | ICD-10-CM

## 2021-06-28 ENCOUNTER — Other Ambulatory Visit (HOSPITAL_COMMUNITY): Payer: Self-pay

## 2021-06-28 MED ORDER — HYDROCODONE-ACETAMINOPHEN 5-325 MG PO TABS
ORAL_TABLET | ORAL | 0 refills | Status: DC
  Filled 2021-06-28: qty 30, 10d supply, fill #0

## 2021-07-09 ENCOUNTER — Other Ambulatory Visit (HOSPITAL_COMMUNITY): Payer: Self-pay

## 2021-07-09 MED ORDER — HYDROCODONE-ACETAMINOPHEN 7.5-325 MG PO TABS
1.0000 | ORAL_TABLET | Freq: Three times a day (TID) | ORAL | 0 refills | Status: DC | PRN
  Filled 2021-07-09: qty 30, 10d supply, fill #0

## 2021-07-15 ENCOUNTER — Other Ambulatory Visit (HOSPITAL_COMMUNITY): Payer: Self-pay | Admitting: Radiology

## 2021-07-15 ENCOUNTER — Ambulatory Visit (HOSPITAL_COMMUNITY)
Admission: RE | Admit: 2021-07-15 | Discharge: 2021-07-15 | Disposition: A | Discharge status: Home / Self Care | Payer: Medicare Other | Source: Ambulatory Visit | Attending: Radiology | Admitting: Radiology

## 2021-07-15 DIAGNOSIS — C680 Malignant neoplasm of urethra: Secondary | ICD-10-CM | POA: Diagnosis not present

## 2021-07-15 DIAGNOSIS — Z436 Encounter for attention to other artificial openings of urinary tract: Secondary | ICD-10-CM | POA: Diagnosis present

## 2021-07-15 HISTORY — PX: IR EXT NEPHROURETERAL CATH EXCHANGE: IMG5418

## 2021-07-22 ENCOUNTER — Other Ambulatory Visit (HOSPITAL_COMMUNITY): Payer: Self-pay

## 2021-07-22 MED ORDER — HYDROCODONE-ACETAMINOPHEN 7.5-325 MG PO TABS
1.0000 | ORAL_TABLET | Freq: Three times a day (TID) | ORAL | 0 refills | Status: DC | PRN
  Filled 2021-07-22: qty 30, 10d supply, fill #0

## 2021-08-02 ENCOUNTER — Ambulatory Visit: Payer: Medicare Other | Admitting: Internal Medicine

## 2021-08-02 ENCOUNTER — Telehealth: Payer: Self-pay

## 2021-08-02 NOTE — Telephone Encounter (Signed)
Patient called to inform Dr.Singh that she had a MRI of her pelvis done on 7/12 at [facility].   [place], CMA

## 2021-08-05 ENCOUNTER — Ambulatory Visit (INDEPENDENT_AMBULATORY_CARE_PROVIDER_SITE_OTHER): Payer: Medicare Other | Admitting: Internal Medicine

## 2021-08-05 ENCOUNTER — Encounter: Payer: Self-pay | Admitting: Internal Medicine

## 2021-08-05 ENCOUNTER — Other Ambulatory Visit: Payer: Self-pay

## 2021-08-05 VITALS — BP 104/71 | HR 75 | Temp 97.8°F | Wt 120.0 lb

## 2021-08-05 DIAGNOSIS — M868X9 Other osteomyelitis, unspecified sites: Secondary | ICD-10-CM | POA: Diagnosis not present

## 2021-08-05 NOTE — Progress Notes (Signed)
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   Thrombocytosis 03/23/2021   Greater trochanter fracture ([place]) 03/17/2021   Vaginal discharge 03/14/2021   COPD (chronic obstructive pulmonary disease) (HCC)    Chronic diastolic CHF (congestive heart failure) ([place])    Depression with anxiety    UTI (urinary tract infection) due to urinary indwelling catheter ([place]) 11/09/2020   Pubic bone Osteomyelitis, bilaterally, 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   ALBUTEROL (VENTOLIN HFA) 108 (90 BASE) MCG/ACT INHALER    Inhale 1 puff into the lungs every 6 (six) hours as needed for shortness of breath.   ARIPIPRAZOLE (ABILIFY) 30 MG TABLET    Take 30 mg by mouth daily.   ASPIRIN EC 81 MG TABLET    Take 81 mg by mouth daily.   ATORVASTATIN (LIPITOR) 10 MG TABLET    Take 10 mg by mouth at bedtime.   B COMPLEX-C (B-COMPLEX WITH VITAMIN C) TABLET    Take 1 tablet by mouth daily.   BUSPIRONE (BUSPAR) 7.5 MG TABLET    Take 7.5 mg by mouth 2 (two) times daily as needed (anxiety).   CALCITRIOL (ROCALTROL) 0.25 MCG CAPSULE    Take 0.25 mcg by mouth daily.   CHOLECALCIFEROL (VITAMIN D-3) 25 MCG (1000 UT) CAPS    Take 2,000 Units by mouth daily.   DULOXETINE (CYMBALTA) 30 MG CAPSULE    Take 30 mg by mouth at bedtime.   FERROUS SULFATE 324  MG TBEC    Take 324 mg by mouth daily with breakfast.   FUROSEMIDE (LASIX) 40 MG TABLET    Take 40 mg by mouth daily.   HYDROCODONE-ACETAMINOPHEN (NORCO) 7.5-325 MG TABLET    Take 1 tablet by mouth every 8 hours as needed for pain   HYDROCODONE-ACETAMINOPHEN (NORCO/VICODIN) 5-325 MG TABLET    Take 1 tablet by mouth every 8 hours as needed for pain   HYDROCODONE-ACETAMINOPHEN (NORCO/VICODIN) 5-325 MG TABLET    Take 1 tablet by mouth every 8 (eight) hours as needed for pain   HYDROCODONE-ACETAMINOPHEN (NORCO/VICODIN) 5-325 MG TABLET    Take 1 tablet by mouth every 8 hours as needed for pain   METHOCARBAMOL (ROBAXIN) 500 MG TABLET    Take 500 mg by mouth 3 (three) times daily as needed for muscle spasms.   MULTIPLE VITAMIN (MULTIVITAMIN ADULT PO)    Take 1 tablet by mouth daily.   PANTOPRAZOLE (PROTONIX) 40 MG TABLET    Take 40 mg by mouth daily.   POTASSIUM CHLORIDE (KLOR-CON) 20 MEQ PACKET    Take 20 mEq by mouth daily.   PROBIOTIC PRODUCT (PROBIOTIC PO)    Take 1 capsule by mouth daily.   VITAMIN C (ASCORBIC ACID) 500 MG TABLET    Take 500 mg by mouth 2 (  two) times a week.  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 Initially admitted to [facility] 2/26-3/9  for sepsis secondary to UTI.  Urine Cx+ PsA and  ESBL Ecoli. She was initially started on vancomycin and meropenem(ESBL Ecoli Hx) then transitioned to meropenem alone. MR showed. MR pelvis  w and w/o showed right greater trochanteric fracture, severe bone marrow edema  in b/l pubic bone concerning for osteomyelitis, concern for myositis throughout adductor musclelature b/l. Pt underwent Bx of pubic bone with IR with cultures and pathology on 3/3 Discharged on meropenem x6  weeks and ID follow-up.She reports Hx treatment of  urethral cancer with chemotherapy and local radiation. Interval: Bacterial Cx negative.  03/29/21: She reports diarrhea continues from hospitalization. It has not worsened, she is taking probiotics. She denies fever, chills.  Interim: She was hospitalized 4/5-4/8 at [facility] with pelvic pain found to have subacute fracture of rt pubic bone, myositis and septic joint. Antibiotics extended another week. CRP 4.6  and ESR 46 significantly improved.  04/30/21: She reports pubic bone tenderness has improved. Diarrhea has improved.  05/31/21: Reports pubic pain has improved but not resolved. She states she continues to have vaginal discharge, yellow/brown in color requiring pullups. She denies fever, chills, N/V/D. She missed her ortho appt with Sophronia Simas at [facility].   Interim: Seen by Laren Boom, Orthopedics at [facility] and fered to [employer].  Seen by Urogyn Dr. Rockey Situ on 6/14. MR pelvis ordered to understand etiology of vaginal drainage. MR showed unhealed right pubic bone fracture, fluid collection from pubic symphysis into retropubic space. C/f fistula.Edema involving the bilateral operator externus, right obturator internus, and right abductor musculature concerning for myositis and overlying cellulitis. Bilateral sacral insufficiency fractures.  Today 7/20: Reports pelvic pain has significanly improved. Vaginal drianage is liquid c/w water consitency  changed from a blood consistency.  Denies fever, chills,, N,V, D  Review of Systems: Review of Systems  All other systems reviewed and are negative.   Past Medical History:  Diagnosis Date   Arthritis    Asthma    Blood transfusion without reported diagnosis    Cancer (HCC)    CHF (congestive heart failure) (HCC)    COPD (chronic obstructive pulmonary disease) (HCC)    Heart murmur    Immune deficiency disorder (HCC)    Renal disorder     Social History   Tobacco Use   Smoking  status: Some Days    Packs/day: 1.00    Years: 50.00    Total pack years: 50.00    Types: Cigarettes, E-cigarettes    Last attempt to quit: 01/21/2019    Years since quitting: 2.5   Smokeless tobacco: Never   Tobacco comments:    Quit smoking but vapes occasionally  Vaping Use   Vaping Use: Never used  Substance Use Topics   Alcohol use: Not Currently   Drug use: Yes    Types: Marijuana    Comment: occasional     Family History  Problem Relation Age of Onset   Heart disease Maternal Grandfather     Allergies  Allergen Reactions   Metoprolol Shortness Of Breath   Shellfish-Derived Products Other (See Comments)    Health Maintenance  Topic Date Due   COVID-19 Vaccine (1)  Never done   Hepatitis C Screening  Never done   TETANUS/TDAP  Never done   Zoster Vaccines- Shingrix (1 of 2) Never done   COLONOSCOPY (Pts 45-95yr Insurance coverage will need to be confirmed)  Never done   MAMMOGRAM  Never done   DEXA SCAN  Never done   Pneumonia Vaccine 66 Years old (2 - PCV) 04/09/2020   INFLUENZA VACCINE  08/17/2021   HPV VACCINES  Aged Out    Objective:  There were no vitals filed for this visit. There is no height or weight on file to calculate BMI.  Physical Exam Constitutional:      Appearance: Normal appearance.  HENT:     Head: Normocephalic and atraumatic.     Right Ear: Tympanic membrane normal.     Left Ear: Tympanic membrane normal.     Nose: Nose normal.     Mouth/Throat:     Mouth: Mucous membranes are moist.  Eyes:     Extraocular Movements: Extraocular movements intact.     Conjunctiva/sclera: Conjunctivae normal.     Pupils: Pupils are equal, round, and reactive to light.  Cardiovascular:     Rate and Rhythm: Normal rate and regular rhythm.     Heart sounds: No murmur heard.    No friction rub. No gallop.  Pulmonary:     Effort: Pulmonary effort is normal.     Breath sounds: Normal breath sounds.  Abdominal:     General: Abdomen is flat.      Palpations: Abdomen is soft.     Comments: Urosotomy  Musculoskeletal:        General: Normal range of motion.  Skin:    General: Skin is warm and dry.  Neurological:     General: No focal deficit present.     Mental Status: She is alert and oriented to person, place, and time.  Psychiatric:        Mood and Affect: Mood normal.     Lab Results Lab Results  Component Value Date   WBC 10.9 (H) 05/31/2021   HGB 9.7 (L) 05/31/2021   HCT 30.2 (L) 05/31/2021   MCV 85.8 05/31/2021   PLT 856 (H) 05/31/2021    Lab Results  Component Value Date   CREATININE 1.94 (H) 05/31/2021   BUN 39 (H) 05/31/2021   NA 137 05/31/2021   K 4.5 05/31/2021   CL 103 05/31/2021   CO2 21 05/31/2021    Lab Results  Component Value Date   ALT 4 (L) 05/31/2021   AST 8 (L) 05/31/2021   ALKPHOS 156 (H) 04/20/2021   BILITOT 0.3 05/31/2021    No results found for: "CHOL", "HDL", "LDLCALC", "LDLDIRECT", "TRIG", "CHOLHDL" Lab Results  Component Value Date   LABRPR NON REACTIVE 03/17/2021   No results found for: "HIV1RNAQUANT", "HIV1RNAVL", "CD4TABS"   #Pubic osteomyelitis and myositis SP Bx of pubic bone on 3/3 -Bone Bx showed osteomyelitis with  negative Cx -Pt re-hospitalized with vaginal spotting and diarrhea. Found to have fracture of right pubic body with  continued substantial pubic OM/septic joint and increased edema, myositis of adductus muscle. Ortho engaged and felt pt likely needed pelvic ortho specialist. Acute phase reactants trended down. Pt discharged on additional week of meropenem(EOT changed from 4/14->4/21) -Completed meropenem x7 weeks(EOT 05/07/21). -[facility] - Seen by [facility] Dr. CGracy Bruinson 6/14. MR pelvis ordered to understand etiology of vaginal drainage.  -MR on 7/12  showed unhealed right pubic bone fracture, fluid collection from pubic symphisis into retropubic space. C/f fistula. Noted edema involving  the bilateral operator externus, right obturator internus, and right abductor musculature concerning for myositis and overlying cellulitis. B/L  sacral insufficiency fractures. Plan -Pt overall reports discharge is better and no longer bloody in consistency. Pelvic pain has significantly resolved. Denies erythema/edema around pelvic/LE. Will do baseline labs today, low concern for ongoing infectious process today.  -Labs today cbc, cmp, esr, crp -F/u uroogyn per MR findings.  -ID 6 months of labs  I spent more than 45 minutes for this patient encounter including reviewing data/chart, and coordinating care and >50% direct face to face time providing counseling/discussing diagnostics/treatment plan with patient  Laurice Record, [place] for Infectious Disease [facility] 08/05/2021, 9:05 AM

## 2021-08-05 NOTE — Patient Instructions (Signed)
Daneil Dolin, [address] [place], [place] [address]   [phone] (Work)

## 2021-08-06 ENCOUNTER — Other Ambulatory Visit (HOSPITAL_COMMUNITY): Payer: Self-pay

## 2021-08-06 LAB — COMPLETE METABOLIC PANEL WITH GFR
AG Ratio: 1.1 (calc) (ref 1.0–2.5)
ALT: 5 U/L — ABNORMAL LOW (ref 6–29)
AST: 8 U/L — ABNORMAL LOW (ref 10–35)
Albumin: 3.6 g/dL (ref 3.6–5.1)
Alkaline phosphatase (APISO): 109 U/L (ref 37–153)
BUN/Creatinine Ratio: 16 (calc) (ref 6–22)
BUN: 27 mg/dL — ABNORMAL HIGH (ref 7–25)
CO2: 19 mmol/L — ABNORMAL LOW (ref 20–32)
Calcium: 9.2 mg/dL (ref 8.6–10.4)
Chloride: 108 mmol/L (ref 98–110)
Creat: 1.68 mg/dL — ABNORMAL HIGH (ref 0.50–1.05)
Globulin: 3.4 g/dL (calc) (ref 1.9–3.7)
Glucose, Bld: 86 mg/dL (ref 65–99)
Potassium: 4.3 mmol/L (ref 3.5–5.3)
Sodium: 139 mmol/L (ref 135–146)
Total Bilirubin: 0.2 mg/dL (ref 0.2–1.2)
Total Protein: 7 g/dL (ref 6.1–8.1)
eGFR: 33 mL/min/{1.73_m2} — ABNORMAL LOW (ref >=60)

## 2021-08-06 LAB — CBC WITH DIFFERENTIAL/PLATELET
Absolute Monocytes: 426 cells/uL (ref 200–950)
Basophils Absolute: 78 cells/uL (ref 0–200)
Basophils Relative: 1.1 %
Eosinophils Absolute: 149 cells/uL (ref 15–500)
Eosinophils Relative: 2.1 %
HCT: 36.2 % (ref 35.0–45.0)
Hemoglobin: 11.4 g/dL — ABNORMAL LOW (ref 11.7–15.5)
Lymphs Abs: 1512 cells/uL (ref 850–3900)
MCH: 27.9 pg (ref 27.0–33.0)
MCHC: 31.5 g/dL — ABNORMAL LOW (ref 32.0–36.0)
MCV: 88.5 fL (ref 80.0–100.0)
MPV: 10.5 fL (ref 7.5–12.5)
Monocytes Relative: 6 %
Neutro Abs: 4935 cells/uL (ref 1500–7800)
Neutrophils Relative %: 69.5 %
Platelets: 536 10*3/uL — ABNORMAL HIGH (ref 140–400)
RBC: 4.09 10*6/uL (ref 3.80–5.10)
RDW: 15.2 % — ABNORMAL HIGH (ref 11.0–15.0)
Total Lymphocyte: 21.3 %
WBC: 7.1 10*3/uL (ref 3.8–10.8)

## 2021-08-06 LAB — SEDIMENTATION RATE: Sed Rate: 48 mm/h — ABNORMAL HIGH (ref 0–30)

## 2021-08-06 LAB — C-REACTIVE PROTEIN: CRP: 2.3 mg/L (ref <8.0)

## 2021-08-06 MED ORDER — HYDROCODONE-ACETAMINOPHEN 7.5-325 MG PO TABS
1.0000 | ORAL_TABLET | Freq: Three times a day (TID) | ORAL | 0 refills | Status: DC | PRN
  Filled 2021-08-06: qty 90, 30d supply, fill #0

## 2021-08-10 ENCOUNTER — Telehealth: Payer: Self-pay | Admitting: Oncology

## 2021-08-10 NOTE — Telephone Encounter (Signed)
.  Called patient to schedule appointment per 7/25 inbasket, patient is aware of date and time.Pt req to r/s earlier in the year

## 2021-08-12 ENCOUNTER — Ambulatory Visit: Payer: Medicare Other | Admitting: Internal Medicine

## 2021-08-12 ENCOUNTER — Other Ambulatory Visit: Payer: Self-pay

## 2021-08-12 DIAGNOSIS — M868X9 Other osteomyelitis, unspecified sites: Secondary | ICD-10-CM

## 2021-08-12 NOTE — Progress Notes (Signed)
Subjective:    Patient ID: Shelia Chan, female    DOB: 1955-07-24, 66 y.o.   MRN: [id number]  Virtual Visit via Telephone/Video Note   I connected with Shelia Chan on 08/12/2021 at 9:17 AM by telephone and verified that I am speaking with the correct person using two identifiers.   I discussed the limitations, risks, security and privacy concerns of performing an evaluation and management service by telephone and the availability of in person appointments. I also discussed with the patient that there may be a patient responsible charge related to this service. The patient expressed understanding and agreed to proceed.  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 Initially admitted to [facility] 2/26-3/9  for sepsis secondary to UTI.  Urine Cx+ PsA and  ESBL Ecoli. She was initially started on vancomycin and meropenem(ESBL Ecoli Hx) then transitioned to meropenem alone. MR showed. MR pelvis  w and w/o showed right greater trochanteric fracture, severe bone marrow edema  in b/l pubic bone concerning for osteomyelitis, concern for myositis throughout adductor musclelature b/l. Pt underwent Bx of pubic bone with IR with cultures and pathology on 3/3 Discharged on meropenem x6 weeks and ID follow-up.She reports Hx treatment of  urethral cancer with chemotherapy and local radiation. Interval: Bacterial Cx negative.  03/29/21: She reports diarrhea continues from hospitalization. It has not worsened, she is taking probiotics. She denies fever, chills.  Interim: She was hospitalized 4/5-4/8 at [facility] with pelvic pain found to have subacute fracture of rt pubic bone, myositis and septic joint. Antibiotics extended another week. CRP 4.6  and ESR 46 significantly improved.  04/30/21: She reports  pubic bone tenderness has improved. Diarrhea has improved.  05/31/21: Reports pubic pain has improved but not resolved. She states she continues to have vaginal discharge, yellow/brown in color requiring pullups. She denies fever, chills, N/V/D. She missed her ortho appt with Sophronia Simas at [facility].   Interim: Seen by Laren Boom, Orthopedics at [facility] and fered to [employer].  Seen by Urogyn Dr. Rockey Situ on 6/14. MR pelvis ordered to understand etiology of vaginal drainage. MR showed unhealed right pubic bone fracture, fluid collection from pubic symphysis into retropubic space. C/f fistula.Edema involving the bilateral operator externus, right obturator internus, and right abductor musculature concerning for myositis and overlying cellulitis. Bilateral sacral insufficiency fractures. 7/20: Reports pelvic pain has significanly improved. Vaginal drianage is liquid c/w water consitency  changed from a blood consistency.  Denies fever, chills,, N,V, D Today 08/12/21 Televisit: Pt wanted and call and let me know that she had appt with Urogyn to review imaging on 7/25. Vaginal drainage noted of unknown etiology with a tract from retropubic space may be related to osteomyelitis. No intervention at this tme, recommended consult with Ortho for management of osteomyelitis. Pt reports her discharge is less. Denies fevers and chills.    Allergies  Allergen Reactions   Metoprolol Shortness Of Breath   Shellfish-Derived Products Other (See Comments)      Outpatient Medications Prior to Visit  Medication Sig Dispense Refill   acetaminophen (TYLENOL) 500 MG tablet Take 500-1,000 mg by mouth every 6 (six) hours as needed for moderate pain or fever.     albuterol (VENTOLIN HFA) 108 (90 Base) MCG/ACT inhaler Inhale 1 puff into the lungs every 6 (six) hours as needed for shortness  of breath.     ARIPiprazole (ABILIFY) 30 MG tablet Take 30 mg by mouth daily.     aspirin EC 81 MG tablet Take 81 mg by mouth  daily.     atorvastatin (LIPITOR) 10 MG tablet Take 10 mg by mouth at bedtime.     B Complex-C (B-COMPLEX WITH VITAMIN C) tablet Take 1 tablet by mouth daily.     busPIRone (BUSPAR) 7.5 MG tablet Take 7.5 mg by mouth 2 (two) times daily as needed (anxiety).     calcitRIOL (ROCALTROL) 0.25 MCG capsule Take 0.25 mcg by mouth daily.     Cholecalciferol (VITAMIN D-3) 25 MCG (1000 UT) CAPS Take 2,000 Units by mouth daily.     DULoxetine (CYMBALTA) 30 MG capsule Take 30 mg by mouth at bedtime.     ferrous sulfate 324 MG TBEC Take 324 mg by mouth daily with breakfast.     furosemide (LASIX) 40 MG tablet Take 40 mg by mouth daily.     HYDROcodone-acetaminophen (NORCO) 7.5-325 MG tablet Take 1 tablet by mouth every 8 hours as needed for pain 90 tablet 0   HYDROcodone-acetaminophen (NORCO/VICODIN) 5-325 MG tablet Take 1 tablet by mouth every 8 hours as needed for pain 30 tablet 0   HYDROcodone-acetaminophen (NORCO/VICODIN) 5-325 MG tablet Take 1 tablet by mouth every 8 (eight) hours as needed for pain 21 tablet 0   HYDROcodone-acetaminophen (NORCO/VICODIN) 5-325 MG tablet Take 1 tablet by mouth every 8 hours as needed for pain 30 tablet 0   methocarbamol (ROBAXIN) 500 MG tablet Take 500 mg by mouth 3 (three) times daily as needed for muscle spasms.     Multiple Vitamin (MULTIVITAMIN ADULT PO) Take 1 tablet by mouth daily.     pantoprazole (PROTONIX) 40 MG tablet Take 40 mg by mouth daily.     potassium chloride (KLOR-CON) 20 MEQ packet Take 20 mEq by mouth daily.     Probiotic Product (PROBIOTIC PO) Take 1 capsule by mouth daily.     vitamin C (ASCORBIC ACID) 500 MG tablet Take 500 mg by mouth 2 (two) times a week.     No facility-administered medications prior to visit.     Past Medical History:  Diagnosis Date   Arthritis    Asthma    Blood transfusion without reported diagnosis    Cancer ([place])    CHF (congestive heart failure) (HCC)    COPD (chronic obstructive pulmonary disease) ([place])     Heart murmur    Immune deficiency disorder ([place])    Renal disorder      Past Surgical History:  Procedure Laterality Date   BLADDER REMOVAL  118/22/2015   FRACTURE SURGERY Left 02/2018   pins   IR CATHETER TUBE CHANGE  04/15/2019   IR CATHETER TUBE CHANGE  04/15/2019   IR EXT NEPHROURETERAL CATH EXCHANGE  07/15/2019   IR EXT NEPHROURETERAL CATH EXCHANGE  10/14/2019   IR EXT NEPHROURETERAL CATH EXCHANGE  10/14/2019   IR EXT NEPHROURETERAL CATH EXCHANGE  01/06/2020   IR EXT NEPHROURETERAL CATH EXCHANGE  03/16/2020   IR EXT NEPHROURETERAL CATH EXCHANGE  05/25/2020   IR EXT NEPHROURETERAL CATH EXCHANGE  07/17/2020   IR EXT NEPHROURETERAL CATH EXCHANGE  07/17/2020   IR EXT NEPHROURETERAL CATH EXCHANGE  08/14/2020   IR EXT NEPHROURETERAL CATH EXCHANGE  09/25/2020   IR EXT NEPHROURETERAL CATH EXCHANGE  11/06/2020   IR EXT NEPHROURETERAL CATH EXCHANGE  12/25/2020   IR EXT NEPHROURETERAL CATH EXCHANGE  02/09/2021   IR EXT  NEPHROURETERAL CATH EXCHANGE  03/19/2021   IR EXT NEPHROURETERAL CATH EXCHANGE  03/19/2021   IR EXT NEPHROURETERAL CATH EXCHANGE  04/23/2021   IR EXT NEPHROURETERAL CATH EXCHANGE  04/23/2021   IR EXT NEPHROURETERAL CATH EXCHANGE  06/04/2021   IR EXT NEPHROURETERAL CATH EXCHANGE  07/15/2021   IR PERC TUN PERIT CATH WO PORT S&I /IMAG  03/25/2021   IR REMOVAL TUN CV CATH W/O FL  06/04/2021       Review of Systems  All other systems reviewed and are negative.     Objective:    Nursing note and vital signs reviewed.     Assessment & Plan:  #Pubic osteomyelitis and myositis SP Bx of pubic bone on 3/3 -Bone Bx showed osteomyelitis with  negative Cx -Pt re-hospitalized with vaginal spotting and diarrhea. Found to have fracture of right pubic body with  continued substantial pubic OM/septic joint and increased edema, myositis of adductus muscle. Ortho engaged and felt pt likely needed pelvic ortho specialist. Acute phase reactants trended down. Pt discharged on additional week of meropenem(EOT  changed from 4/14->4/21) -Completed meropenem x7 weeks(EOT 05/07/21). -Seen by Gilberto Better at [facility] on 5/22 per pelvic fracture and referred to [place]. - Seen by [facility], Dr. Gracy Bruins on 6/14. MR pelvis ordered to understand etiology of vaginal drainage.  -MR on 7/12 showed unhealed right pubic bone fracture, fluid collection from pubic symphisis into retropubic space. C/f fistula. Noted edema involving the bilateral operator externus, right obturator internus, and right abductor musculature concerning for myositis and overlying cellulitis. B/L  sacral insufficiency fractures. -Seen by UroGyn on 7/25 in regards to MRI findings, no plan for intervention. Vaginal discharge of unclear source, concerned may be related osteo. Pt counseled to f/u ortho. -No role for antibioics at this point as pt completed 7 weeks of merrem. Will continue to follow clinically and labs Plan: -Follow-up in 6 months or sooner if needed with labs -Pt provided information for Ortho at [facility](Dr. Tamala Bari) whom she saw in the past.   I am having Deeanne Schedler maintain her Multiple Vitamin (MULTIVITAMIN ADULT PO), ARIPiprazole, aspirin EC, albuterol, DULoxetine, B-complex with vitamin C, ascorbic acid, Vitamin D-3, busPIRone, atorvastatin, calcitRIOL, methocarbamol, pantoprazole, acetaminophen, Probiotic Product (PROBIOTIC PO), furosemide, ferrous sulfate, potassium chloride, HYDROcodone-acetaminophen, HYDROcodone-acetaminophen, HYDROcodone-acetaminophen, and HYDROcodone-acetaminophen.   No orders of the defined types were placed in this encounter.    I discussed the assessment and treatment plan with the patient. The patient was provided an opportunity to ask questions and all were answered. The patient agreed with the plan and demonstrated an understanding of the instructions.   The patient was advised to call back or seek an in-person evaluation if the symptoms worsen or if the condition fails to improve as anticipated.    I provided  23 minutes of non-face-to-face time during this encounter.  Follow-up: No follow-ups on file.   [place] for Infectious Disease [facility] Main number: [phone]

## 2021-08-26 ENCOUNTER — Other Ambulatory Visit (HOSPITAL_COMMUNITY): Payer: Medicare Other

## 2021-09-02 ENCOUNTER — Other Ambulatory Visit (HOSPITAL_COMMUNITY): Payer: Self-pay | Admitting: Radiology

## 2021-09-02 ENCOUNTER — Ambulatory Visit (HOSPITAL_COMMUNITY)
Admission: RE | Admit: 2021-09-02 | Discharge: 2021-09-02 | Disposition: A | Discharge status: Home / Self Care | Payer: Medicare Other | Source: Ambulatory Visit | Attending: Radiology | Admitting: Radiology

## 2021-09-02 DIAGNOSIS — C680 Malignant neoplasm of urethra: Secondary | ICD-10-CM | POA: Diagnosis not present

## 2021-09-02 DIAGNOSIS — Z434 Encounter for attention to other artificial openings of digestive tract: Secondary | ICD-10-CM | POA: Diagnosis not present

## 2021-09-02 HISTORY — PX: IR EXT NEPHROURETERAL CATH EXCHANGE: IMG5418

## 2021-09-02 MED ORDER — IOHEXOL 300 MG/ML  SOLN: 50.0000 mL | Freq: Once | INTRAMUSCULAR | Status: DC | PRN

## 2021-09-02 NOTE — Procedures (Signed)
Interventional Radiology Procedure Note  Procedure:   Image guided exchange of bilateral retrograde nephroureteral drains via ostomy. 2 x 47V 98XA   Complications: None Recommendations:  -  routine care     Signed,  Dulcy Fanny. Earleen Newport, DO

## 2021-09-10 ENCOUNTER — Other Ambulatory Visit (HOSPITAL_COMMUNITY): Payer: Self-pay

## 2021-09-10 MED ORDER — HYDROCODONE-ACETAMINOPHEN 7.5-325 MG PO TABS
1.0000 | ORAL_TABLET | Freq: Three times a day (TID) | ORAL | 0 refills | Status: DC | PRN
  Filled 2021-09-10: qty 90, 30d supply, fill #0

## 2021-10-14 ENCOUNTER — Ambulatory Visit (HOSPITAL_COMMUNITY)
Admission: RE | Admit: 2021-10-14 | Discharge: 2021-10-14 | Disposition: A | Discharge status: Home / Self Care | Payer: Medicare Other | Source: Ambulatory Visit | Attending: Radiology | Admitting: Radiology

## 2021-10-14 ENCOUNTER — Other Ambulatory Visit (HOSPITAL_COMMUNITY): Payer: Self-pay | Admitting: Radiology

## 2021-10-14 DIAGNOSIS — C680 Malignant neoplasm of urethra: Secondary | ICD-10-CM | POA: Diagnosis not present

## 2021-10-14 DIAGNOSIS — Z436 Encounter for attention to other artificial openings of urinary tract: Secondary | ICD-10-CM | POA: Diagnosis not present

## 2021-10-14 HISTORY — PX: IR EXT NEPHROURETERAL CATH EXCHANGE: IMG5418

## 2021-10-14 MED ORDER — IOHEXOL 300 MG/ML  SOLN
50.0000 mL | Freq: Once | INTRAMUSCULAR | Status: AC | PRN
  Administered 2021-10-14: 15 mL

## 2021-10-14 NOTE — Procedures (Signed)
Interventional Radiology Procedure Note  Procedure: Image guided exchange bilateral nephroureteral drains, retrograde, via ostomy, bilateral  18F pigtail drain.  Complications: None    Recommendations: - Routine drain care,        Signed,  Dulcy Fanny. Earleen Newport, DO

## 2021-10-19 ENCOUNTER — Other Ambulatory Visit (HOSPITAL_COMMUNITY): Payer: Self-pay

## 2021-10-19 MED ORDER — HYDROCODONE-ACETAMINOPHEN 7.5-325 MG PO TABS
1.0000 | ORAL_TABLET | Freq: Three times a day (TID) | ORAL | 0 refills | Status: DC | PRN
  Filled 2021-10-19: qty 90, 30d supply, fill #0

## 2021-10-28 ENCOUNTER — Other Ambulatory Visit: Payer: Self-pay | Admitting: Family Medicine

## 2021-10-28 ENCOUNTER — Ambulatory Visit
Admission: RE | Admit: 2021-10-28 | Discharge: 2021-10-28 | Disposition: A | Discharge status: Home / Self Care | Payer: Medicare Other | Source: Ambulatory Visit | Attending: Family Medicine | Admitting: Family Medicine

## 2021-10-28 ENCOUNTER — Other Ambulatory Visit: Payer: Self-pay

## 2021-10-28 ENCOUNTER — Inpatient Hospital Stay: Payer: Medicare Other | Attending: Oncology | Admitting: Oncology

## 2021-10-28 VITALS — BP 140/72 | HR 74 | Temp 98.2°F | Resp 18 | Ht 61.0 in | Wt 132.7 lb

## 2021-10-28 DIAGNOSIS — N1330 Unspecified hydronephrosis: Secondary | ICD-10-CM | POA: Insufficient documentation

## 2021-10-28 DIAGNOSIS — C669 Malignant neoplasm of unspecified ureter: Secondary | ICD-10-CM | POA: Diagnosis present

## 2021-10-28 DIAGNOSIS — Z79899 Other long term (current) drug therapy: Secondary | ICD-10-CM | POA: Insufficient documentation

## 2021-10-28 DIAGNOSIS — Z7982 Long term (current) use of aspirin: Secondary | ICD-10-CM | POA: Insufficient documentation

## 2021-10-28 DIAGNOSIS — Z923 Personal history of irradiation: Secondary | ICD-10-CM | POA: Insufficient documentation

## 2021-10-28 DIAGNOSIS — M25579 Pain in unspecified ankle and joints of unspecified foot: Secondary | ICD-10-CM

## 2021-10-28 DIAGNOSIS — Z9221 Personal history of antineoplastic chemotherapy: Secondary | ICD-10-CM | POA: Diagnosis not present

## 2021-10-28 DIAGNOSIS — R600 Localized edema: Secondary | ICD-10-CM | POA: Diagnosis not present

## 2021-10-28 NOTE — Progress Notes (Signed)
Hematology and Oncology Follow Up Visit  Shelia Chan [id number] 04/20/1955 66 y.o. 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  She is currently on active surveillance without any evidence of relapse.   Prior Therapy: She underwent a radical cystectomy, urethrectomy and a bilateral lymph node dissection and ileal conduit in October 2015.  The final pathology showed a T4N2 positive margins with 2 out of 28 lymph nodes involved with squamous cell carcinoma.  She received additional therapy utilizing radiation and cisplatin completed in 2016.   Current therapy: Active surveillance.  Interim History: Shelia Chan returns today for a follow-up visit.  She is a 66 year old woman with history of squamous cell carcinoma of the ureter and currently on active surveillance.  She had some issues show since her last visit including hip pain and lower extremity swelling and potential gout.  Overall, she is still ambulatory and attends to activities of daily living     Medications: I have reviewed the patient's current medications.  Current Outpatient Medications  Medication Sig Dispense Refill   acetaminophen (TYLENOL) 500 MG tablet Take 500-1,000 mg by mouth every 6 (six) hours as needed for moderate pain or fever.     albuterol (VENTOLIN HFA) 108 (90 Base) MCG/ACT inhaler Inhale 1 puff into the lungs every 6 (six) hours as needed for shortness of breath.     ARIPiprazole (ABILIFY) 30 MG tablet Take 30 mg by mouth daily.     aspirin EC 81 MG tablet Take 81 mg by mouth daily.     atorvastatin (LIPITOR) 10 MG tablet Take 10 mg by mouth at bedtime.     B Complex-C (B-COMPLEX WITH VITAMIN C) tablet Take 1 tablet by mouth daily.     busPIRone (BUSPAR) 7.5 MG tablet Take 7.5 mg by mouth 2 (two) times daily as needed (anxiety).     calcitRIOL (ROCALTROL) 0.25 MCG capsule Take 0.25  mcg by mouth daily.     Cholecalciferol (VITAMIN D-3) 25 MCG (1000 UT) CAPS Take 2,000 Units by mouth daily.     DULoxetine (CYMBALTA) 30 MG capsule Take 30 mg by mouth at bedtime.     ferrous sulfate 324 MG TBEC Take 324 mg by mouth daily with breakfast.     furosemide (LASIX) 40 MG tablet Take 40 mg by mouth daily.     HYDROcodone-acetaminophen (NORCO) 7.5-325 MG tablet Take 1 tablet by mouth every 8 hours as needed for pain 90 tablet 0   HYDROcodone-acetaminophen (NORCO/VICODIN) 5-325 MG tablet Take 1 tablet by mouth every 8 hours as needed for pain 30 tablet 0   HYDROcodone-acetaminophen (NORCO/VICODIN) 5-325 MG tablet Take 1 tablet by mouth every 8 (eight) hours as needed for pain 21 tablet 0   HYDROcodone-acetaminophen (NORCO/VICODIN) 5-325 MG tablet Take 1 tablet by mouth every 8 hours as needed for pain 30 tablet 0   methocarbamol (ROBAXIN) 500 MG tablet Take 500 mg by mouth 3 (three) times daily as needed for muscle spasms.     Multiple Vitamin (MULTIVITAMIN ADULT PO) Take 1 tablet by mouth daily.     pantoprazole (PROTONIX) 40 MG tablet Take 40 mg by mouth daily.     potassium chloride (KLOR-CON) 20 MEQ packet Take 20 mEq by mouth daily.     Probiotic Product (PROBIOTIC PO) Take 1 capsule by mouth daily.     vitamin C (ASCORBIC ACID) 500 MG tablet  Take 500 mg by mouth 2 (two) times a week.     No current facility-administered medications for this visit.     Allergies:  Allergies  Allergen Reactions   Metoprolol Shortness Of Breath   Shellfish-Derived Products Other (See Comments)     Physical Exam: Blood pressure (!) 140/72, pulse 74, temperature 98.2 F (36.8 C), temperature source Temporal, resp. rate 18, height '5\' 1"'$  (1.549 m), weight 132 lb 11.2 oz (60.2 kg), SpO2 100 %. ECOG 1 General appearance: Comfortable appearing without any discomfort Head: Normocephalic without any trauma Oropharynx: Mucous membranes are moist and pink without any thrush or ulcers. Eyes:  Pupils are equal and round reactive to light. Lymph nodes: No cervical, supraclavicular, inguinal or axillary lymphadenopathy.   Heart:regular rate and rhythm.  S1 and S2.  Bilateral lower extremity edema noted. Lung: Clear without any rhonchi or wheezes.  No dullness to percussion. Abdomin: Soft, nontender, nondistended with good bowel sounds.  No hepatosplenomegaly. Musculoskeletal: No joint deformity or effusion.  Full range of motion noted. Neurological: No deficits noted on motor, sensory and deep tendon reflex exam. Skin: No petechial rash or dryness.  Appeared moist.  Psychiatric: Mood and affect appeared appropriate.      Lab Results: Lab Results  Component Value Date   WBC 7.1 08/05/2021   HGB 11.4 (L) 08/05/2021   HCT 36.2 08/05/2021   MCV 88.5 08/05/2021   PLT 536 (H) 08/05/2021     Chemistry      Component Value Date/Time   NA 139 08/05/2021 0927   K 4.3 08/05/2021 0927   CL 108 08/05/2021 0927   CO2 19 (L) 08/05/2021 0927   BUN 27 (H) 08/05/2021 0927   CREATININE 1.68 (H) 08/05/2021 0927      Component Value Date/Time   CALCIUM 9.2 08/05/2021 0927   ALKPHOS 156 (H) 04/20/2021 2158   AST 8 (L) 08/05/2021 0927   ALT 5 (L) 08/05/2021 0927   BILITOT 0.2 08/05/2021 0927          Impression and Plan:   66 year old woman with:  1.  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  She is status post surgical resection and ileal conduit formation followed by adjuvant cisplatin and radiation therapy concluded in 2016.   He is currently on active surveillance without any evidence of relapse.  CT scan as well as chest x-ray obtained in April 2023 showed no evidence of disease recurrence.  At this time, she is over 6 years out from her treatment and no additional imaging is required unless she has symptoms in the future.   2.  Bilateral hydronephrosis: Ureteral stents continue to be changed periodically by interventional radiology.   3.   Bilateral lower extremity edema: Continues to follow with her primary care regarding this issue.   4.  Follow-up: No further oncology follow-up is needed unless she has disease relapse or questions in the future.   30  minutes were spent on this encounter.  Time was dedicated to reviewing her disease status, treatment choices and outlining future plan of care review.      Zola Button, MD 10/12/202310:06 AM

## 2021-11-10 ENCOUNTER — Other Ambulatory Visit (HOSPITAL_COMMUNITY): Payer: Self-pay

## 2021-11-10 MED ORDER — HYDROCODONE-ACETAMINOPHEN 7.5-325 MG PO TABS
1.0000 | ORAL_TABLET | Freq: Three times a day (TID) | ORAL | 0 refills | Status: DC | PRN
  Filled 2021-11-16: qty 90, 30d supply, fill #0

## 2021-11-11 ENCOUNTER — Other Ambulatory Visit (HOSPITAL_COMMUNITY): Payer: Self-pay

## 2021-11-16 ENCOUNTER — Other Ambulatory Visit (HOSPITAL_COMMUNITY): Payer: Self-pay

## 2021-11-18 ENCOUNTER — Ambulatory Visit: Payer: Medicare Other | Admitting: Oncology

## 2021-11-25 ENCOUNTER — Other Ambulatory Visit (HOSPITAL_COMMUNITY): Payer: Self-pay | Admitting: Radiology

## 2021-11-25 ENCOUNTER — Ambulatory Visit (HOSPITAL_COMMUNITY)
Admission: RE | Admit: 2021-11-25 | Discharge: 2021-11-25 | Disposition: A | Discharge status: Home / Self Care | Payer: Medicare Other | Source: Ambulatory Visit | Attending: Radiology | Admitting: Radiology

## 2021-11-25 DIAGNOSIS — C680 Malignant neoplasm of urethra: Secondary | ICD-10-CM | POA: Diagnosis not present

## 2021-11-25 DIAGNOSIS — Z436 Encounter for attention to other artificial openings of urinary tract: Secondary | ICD-10-CM | POA: Diagnosis present

## 2021-11-25 HISTORY — PX: IR EXT NEPHROURETERAL CATH EXCHANGE: IMG5418

## 2021-11-25 MED ORDER — IOHEXOL 300 MG/ML  SOLN
50.0000 mL | Freq: Once | INTRAMUSCULAR | Status: AC | PRN
Start: 2021-11-25 — End: 2021-11-25
  Administered 2021-11-25: 15 mL

## 2021-12-24 ENCOUNTER — Other Ambulatory Visit: Payer: Self-pay | Admitting: Family Medicine

## 2021-12-24 DIAGNOSIS — E2839 Other primary ovarian failure: Secondary | ICD-10-CM

## 2021-12-24 DIAGNOSIS — Z1231 Encounter for screening mammogram for malignant neoplasm of breast: Secondary | ICD-10-CM

## 2021-12-27 ENCOUNTER — Other Ambulatory Visit (HOSPITAL_COMMUNITY): Payer: Self-pay

## 2021-12-27 MED ORDER — HYDROCODONE-ACETAMINOPHEN 7.5-325 MG PO TABS
1.0000 | ORAL_TABLET | Freq: Three times a day (TID) | ORAL | 0 refills | Status: DC | PRN
  Filled 2021-12-27: qty 90, 30d supply, fill #0

## 2021-12-30 ENCOUNTER — Ambulatory Visit: Payer: Medicare Other | Admitting: Physician Assistant

## 2022-01-06 ENCOUNTER — Other Ambulatory Visit (HOSPITAL_COMMUNITY): Payer: Self-pay | Admitting: Radiology

## 2022-01-06 ENCOUNTER — Ambulatory Visit (HOSPITAL_COMMUNITY)
Admission: RE | Admit: 2022-01-06 | Discharge: 2022-01-06 | Disposition: A | Discharge status: Home / Self Care | Payer: Medicare Other | Source: Ambulatory Visit | Attending: Radiology | Admitting: Radiology

## 2022-01-06 DIAGNOSIS — C680 Malignant neoplasm of urethra: Secondary | ICD-10-CM | POA: Insufficient documentation

## 2022-01-06 DIAGNOSIS — Z436 Encounter for attention to other artificial openings of urinary tract: Secondary | ICD-10-CM | POA: Insufficient documentation

## 2022-01-06 HISTORY — PX: IR EXT NEPHROURETERAL CATH EXCHANGE: IMG5418

## 2022-01-06 MED ORDER — IOHEXOL 300 MG/ML  SOLN
50.0000 mL | Freq: Once | INTRAMUSCULAR | Status: AC | PRN
  Administered 2022-01-06: 15 mL

## 2022-01-27 ENCOUNTER — Encounter: Payer: Self-pay | Admitting: Internal Medicine

## 2022-01-27 ENCOUNTER — Other Ambulatory Visit: Payer: Self-pay

## 2022-01-27 ENCOUNTER — Other Ambulatory Visit (HOSPITAL_COMMUNITY): Payer: Self-pay

## 2022-01-27 ENCOUNTER — Ambulatory Visit (INDEPENDENT_AMBULATORY_CARE_PROVIDER_SITE_OTHER): Payer: Medicare Other | Admitting: Internal Medicine

## 2022-01-27 VITALS — BP 123/72 | HR 71 | Resp 16 | Ht 63.0 in | Wt 133.2 lb

## 2022-01-27 DIAGNOSIS — M869 Osteomyelitis, unspecified: Secondary | ICD-10-CM

## 2022-01-27 DIAGNOSIS — M8618 Other acute osteomyelitis, other site: Secondary | ICD-10-CM

## 2022-01-27 MED ORDER — HYDROCODONE-ACETAMINOPHEN 7.5-325 MG PO TABS
1.0000 | ORAL_TABLET | Freq: Three times a day (TID) | ORAL | 0 refills | Status: DC | PRN
  Filled 2022-01-27: qty 90, 30d supply, fill #0

## 2022-01-27 NOTE — Progress Notes (Signed)
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   Thrombocytosis 03/23/2021   Greater trochanter fracture ([place]) 03/17/2021   Vaginal discharge 03/14/2021   COPD (chronic obstructive pulmonary disease) (H[facility])    Chronic diastolic CHF (congestive heart failure) ([place])    Depression with anxiety    UTI (urinary tract infection) due to urinary indwelling catheter ([place]) 11/09/2020   Pubic bone Osteomyelitis, bilaterally, 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   ALBUTEROL (VENTOLIN HFA) 108 (90 BASE) MCG/ACT INHALER    Inhale 1 puff into the lungs every 6 (six) hours as needed for shortness of breath.   ARIPIPRAZOLE (ABILIFY) 30 MG TABLET    Take 30 mg by mouth daily.   ASPIRIN EC 81 MG TABLET    Take 81 mg by mouth daily.   ATORVASTATIN (LIPITOR) 10 MG TABLET    Take 10 mg by mouth at bedtime.   B COMPLEX-C (B-COMPLEX WITH VITAMIN C) TABLET    Take 1 tablet by mouth daily.   BUSPIRONE (BUSPAR) 7.5 MG TABLET    Take 7.5 mg by mouth 2 (two) times daily as needed (anxiety).   CALCITRIOL (ROCALTROL) 0.25 MCG CAPSULE    Take 0.25 mcg by mouth daily.   CHOLECALCIFEROL (VITAMIN D-3) 25 MCG (1000 UT) CAPS    Take 2,000 Units by mouth daily.   DULOXETINE (CYMBALTA) 30 MG CAPSULE    Take 30 mg by mouth at bedtime.   FERROUS SULFATE 324  MG TBEC    Take 324 mg by mouth daily with breakfast.   FUROSEMIDE (LASIX) 40 MG TABLET    Take 40 mg by mouth daily.   HYDROCODONE-ACETAMINOPHEN (NORCO) 7.5-325 MG TABLET    Take 1 tablet by mouth every 8 hours as needed for pain   HYDROCODONE-ACETAMINOPHEN (NORCO/VICODIN) 5-325 MG TABLET    Take 1 tablet by mouth every 8 hours as needed for pain   HYDROCODONE-ACETAMINOPHEN (NORCO/VICODIN) 5-325 MG TABLET    Take 1 tablet by mouth every 8 (eight) hours as needed for pain   HYDROCODONE-ACETAMINOPHEN (NORCO/VICODIN) 5-325 MG TABLET    Take 1 tablet by mouth every 8 hours as needed for pain   METHOCARBAMOL (ROBAXIN) 500 MG TABLET    Take 500 mg by mouth 3 (three) times daily as needed for muscle spasms.   MULTIPLE VITAMIN (MULTIVITAMIN ADULT PO)    Take 1 tablet by mouth daily.   PANTOPRAZOLE (PROTONIX) 40 MG TABLET    Take 40 mg by mouth daily.   POTASSIUM CHLORIDE (KLOR-CON) 20 MEQ PACKET    Take 20 mEq by mouth daily.   PROBIOTIC PRODUCT (PROBIOTIC PO)    Take 1 capsule by mouth daily.   VITAMIN C (ASCORBIC ACID) 500 MG TABLET    Take 500 mg by mouth 2 (  two) times a week.  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 Initially admitted to [facility] 2/26-3/9  for sepsis secondary to UTI.  Urine Cx+ PsA and  ESBL Ecoli. She was initially started on vancomycin and meropenem(ESBL Ecoli Hx) then transitioned to meropenem alone. MR showed. MR pelvis  w and w/o showed right greater trochanteric fracture, severe bone marrow edema  in b/l pubic bone concerning for osteomyelitis, concern for myositis throughout adductor musclelature b/l. Pt underwent Bx of pubic bone with IR with cultures and pathology on 3/3 Discharged on meropenem x6  weeks and ID follow-up.She reports Hx treatment of  urethral cancer with chemotherapy and local radiation. Interval: Bacterial Cx negative.  03/29/21: She reports diarrhea continues from hospitalization. It has not worsened, she is taking probiotics. She denies fever, chills.  Interim: She was hospitalized 4/5-4/8 at [facility] with pelvic pain found to have subacute fracture of rt pubic bone, myositis and septic joint. Antibiotics extended another week. CRP 4.6  and ESR 46 significantly improved.  04/30/21: She reports pubic bone tenderness has improved. Diarrhea has improved.  05/31/21: Reports pubic pain has improved but not resolved. She states she continues to have vaginal discharge, yellow/brown in color requiring pullups. She denies fever, chills, N/V/D. She missed her ortho appt with Sophronia Simas at [facility].   Interim: Seen by Laren Boom, Orthopedics at [facility] and fered to [employer].  Seen by Urogyn Dr. Rockey Situ on 6/14. MR pelvis ordered to understand etiology of vaginal drainage. MR showed unhealed right pubic bone fracture, fluid collection from pubic symphysis into retropubic space. C/f fistula.Edema involving the bilateral operator externus, right obturator internus, and right abductor musculature concerning for myositis and overlying cellulitis. Bilateral sacral insufficiency fractures. 7/20: Reports pelvic pain has significanly improved. Vaginal drianage is liquid c/w water consitency  changed from a blood consistency.  Denies fever, chills,, N,V, D  08/12/21 Televisit: Pt wanted and call and let me know that she had appt with Urogyn to review imaging on 7/25. Vaginal drainage noted of unknown etiology with a tract from retropubic space may be related to osteomyelitis. No intervention at this tme, recommended consult with Ortho for management of osteomyelitis. Pt reports her discharge is less. Denies fevers and chills.  Today 1/11: Pt reports pelvic pain is not better. She dontinues to have  brown/yellow vaginal discharge. Deneis fevers.She has not seen ortho since last visit.    Review of Systems: Review of Systems  All other systems reviewed and are negative.   Past Medical History:  Diagnosis Date   Arthritis    Asthma    Blood transfusion without reported diagnosis    Cancer ([place])    CHF (congestive heart failure) (HCC)    COPD (chronic obstructive pulmonary disease) (HCC)    Heart murmur    Immune deficiency disorder (HCC)    Renal disorder     Social History   Tobacco Use   Smoking status: Some Days    Packs/day: 1.00    Years: 50.00    Total pack years: 50.00    Types: Cigarettes, E-cigarettes    Last attempt to quit: 01/21/2019    Years since quitting: 3.0   Smokeless tobacco: Never   Tobacco comments:    Quit smoking but vapes  occasionally  Vaping Use   Vaping Use: Never used  Substance Use Topics   Alcohol use: Not Currently   Drug use: Yes    Types: Marijuana    Comment: occasional     Family History  Problem Relation Age of Onset   Heart disease Maternal Grandfather     Allergies  Allergen Reactions   Metoprolol Shortness Of Breath   Shellfish-Derived Products Other (See Comments)    Health Maintenance  Topic Date Due   Medicare Annual Wellness (AWV)  Never done   COVID-19 Vaccine (1) Never done   Hepatitis C Screening  Never done   DTaP/Tdap/Td (1 - Tdap) Never done   Zoster Vaccines- Shingrix (1 of 2) Never done   COLONOSCOPY (Pts 45-53yr Insurance coverage will need to be confirmed)  Never done   Lung Cancer Screening  Never done   MAMMOGRAM  Never done   DEXA SCAN  Never done   Pneumonia Vaccine 67 Years old (2 - PCV) 04/09/2020   INFLUENZA VACCINE  Never done   HPV VACCINES  Aged Out    Objective:  There were no vitals filed for this visit. There is no height or weight on file to calculate BMI.  Physical Exam Constitutional:      Appearance: Normal appearance.  HENT:     Head: Normocephalic and atraumatic.      Right Ear: Tympanic membrane normal.     Left Ear: Tympanic membrane normal.     Nose: Nose normal.     Mouth/Throat:     Mouth: Mucous membranes are moist.  Eyes:     Extraocular Movements: Extraocular movements intact.     Conjunctiva/sclera: Conjunctivae normal.     Pupils: Pupils are equal, round, and reactive to light.  Cardiovascular:     Rate and Rhythm: Normal rate and regular rhythm.     Heart sounds: No murmur heard.    No friction rub. No gallop.  Pulmonary:     Effort: Pulmonary effort is normal.     Breath sounds: Normal breath sounds.  Abdominal:     General: Abdomen is flat.     Palpations: Abdomen is soft.  Musculoskeletal:        General: Normal range of motion.  Skin:    General: Skin is warm and dry.  Neurological:     General: No focal deficit present.     Mental Status: She is alert and oriented to person, place, and time.  Psychiatric:        Mood and Affect: Mood normal.     Lab Results Lab Results  Component Value Date   WBC 7.1 08/05/2021   HGB 11.4 (L) 08/05/2021   HCT 36.2 08/05/2021   MCV 88.5 08/05/2021   PLT 536 (H) 08/05/2021    Lab Results  Component Value Date   CREATININE 1.68 (H) 08/05/2021   BUN 27 (H) 08/05/2021   NA 139 08/05/2021   K 4.3 08/05/2021   CL 108 08/05/2021   CO2 19 (L) 08/05/2021    Lab Results  Component Value Date   ALT 5 (L) 08/05/2021   AST 8 (L) 08/05/2021   ALKPHOS 156 (H) 04/20/2021   BILITOT 0.2 08/05/2021    No results found for: "CHOL", "HDL", "LDLCALC", "LDLDIRECT", "TRIG", "CHOLHDL" Lab Results  Component Value Date   LABRPR NON REACTIVE 03/17/2021   No results found for: "HIV1RNAQUANT", "HIV1RNAVL", "CD4TABS"   Assessment/Plan #Pubic osteomyelitis and myositis SP Bx of pubic bone on 3/3 -Bone  Bx showed osteomyelitis with  negative Cx -Pt re-hospitalized with vaginal spotting and diarrhea. Found to have fracture of right pubic body with  continued substantial pubic OM/septic joint and  increased edema, myositis of adductus muscle. Ortho engaged and felt pt likely needed pelvic ortho specialist. Acute phase reactants trended down. Pt discharged on additional week of meropenem(EOT changed from 4/14->4/21) -Completed meropenem x7 weeks(EOT 05/07/21). -Seen by Gilberto Better at [facility] on 5/22 per pelvic fracture and referred to [place]. - Seen by [facility], Dr. Gracy Bruins on 6/14. MR pelvis ordered to understand etiology of vaginal drainage.  -MR on 7/12 showed unhealed right pubic bone fracture, fluid collection from pubic symphisis into retropubic space. C/f fistula. Noted edema involving the bilateral operator externus, right obturator internus, and right abductor musculature concerning for myositis and overlying cellulitis. B/L  sacral insufficiency fractures. -Seen by UroGyn on 7/25 in regards to MRI findings, no plan for intervention. Vaginal discharge of unclear source, concerned may be related osteo. Pt counseled to f/u ortho. -No role for antibioics at this point as pt completed 7 weeks of merrem. 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 on 10/12, CT in April 2023 stable, F/U PRN Plan: -Labs today -Pt provided information again for Ortho at [facility]) whom she saw in the past.  -I spoke to pt in regards to continues pelvic pain and vaginal discharge, if labs are abnormal then will reach out to ortho to obtain imaging. -F/U in one month   Laurice Record, MD [facility] 01/27/2022, 6:06 AM   I have personally spent 69 minutes involved in face-to-face and non-face-to-face activities for this patient on the day of the visit. Professional time spent includes the following activities: Preparing to see the patient (review of tests), Obtaining and/or reviewing separately obtained history  (admission/discharge record), Performing a medically appropriate examination and/or evaluation , Ordering medications/tests/procedures, referring and communicating with other health care professionals, Documenting clinical information in the EMR, Independently interpreting results (not separately reported), Communicating results to the patient/family/caregiver, Counseling and educating the patient/family/caregiver and Care coordination (not separately reported).    I have personally spent 69 minutes involved in face-to-face and non-face-to-face activities for this patient on the day of the visit. Professional time spent includes the following activities: Preparing to see the patient (review of tests), Obtaining and/or reviewing separately obtained history (admission/discharge record), Performing a medically appropriate examination and/or evaluation , Ordering medications/tests/procedures, referring and communicating with other health care professionals, Documenting clinical information in the EMR, Independently interpreting results (not separately reported), Communicating results to the patient/family/caregiver, Counseling and educating the patient/family/caregiver and Care coordination (not separately reported).

## 2022-01-27 NOTE — Patient Instructions (Signed)
[phone])

## 2022-01-28 LAB — CBC WITH DIFFERENTIAL/PLATELET
Absolute Monocytes: 468 cells/uL (ref 200–950)
Basophils Absolute: 51 cells/uL (ref 0–200)
Basophils Relative: 0.6 %
Eosinophils Absolute: 68 cells/uL (ref 15–500)
Eosinophils Relative: 0.8 %
HCT: 37.7 % (ref 35.0–45.0)
Hemoglobin: 12.4 g/dL (ref 11.7–15.5)
Lymphs Abs: 1743 cells/uL (ref 850–3900)
MCH: 29 pg (ref 27.0–33.0)
MCHC: 32.9 g/dL (ref 32.0–36.0)
MCV: 88.1 fL (ref 80.0–100.0)
MPV: 10.4 fL (ref 7.5–12.5)
Monocytes Relative: 5.5 %
Neutro Abs: 6171 cells/uL (ref 1500–7800)
Neutrophils Relative %: 72.6 %
Platelets: 597 10*3/uL — ABNORMAL HIGH (ref 140–400)
RBC: 4.28 10*6/uL (ref 3.80–5.10)
RDW: 13.8 % (ref 11.0–15.0)
Total Lymphocyte: 20.5 %
WBC: 8.5 10*3/uL (ref 3.8–10.8)

## 2022-01-28 LAB — COMPREHENSIVE METABOLIC PANEL
AG Ratio: 1 (calc) (ref 1.0–2.5)
ALT: 6 U/L (ref 6–29)
AST: 9 U/L — ABNORMAL LOW (ref 10–35)
Albumin: 3.9 g/dL (ref 3.6–5.1)
Alkaline phosphatase (APISO): 140 U/L (ref 37–153)
BUN/Creatinine Ratio: 10 (calc) (ref 6–22)
BUN: 20 mg/dL (ref 7–25)
CO2: 20 mmol/L (ref 20–32)
Calcium: 9.7 mg/dL (ref 8.6–10.4)
Chloride: 103 mmol/L (ref 98–110)
Creat: 2.05 mg/dL — ABNORMAL HIGH (ref 0.50–1.05)
Globulin: 3.9 g/dL (calc) — ABNORMAL HIGH (ref 1.9–3.7)
Glucose, Bld: 88 mg/dL (ref 65–99)
Potassium: 4.1 mmol/L (ref 3.5–5.3)
Sodium: 139 mmol/L (ref 135–146)
Total Bilirubin: 0.4 mg/dL (ref 0.2–1.2)
Total Protein: 7.8 g/dL (ref 6.1–8.1)

## 2022-01-28 LAB — SEDIMENTATION RATE: Sed Rate: 103 mm/h — ABNORMAL HIGH (ref 0–30)

## 2022-01-28 LAB — C-REACTIVE PROTEIN: CRP: 122.9 mg/L — ABNORMAL HIGH (ref <8.0)

## 2022-02-03 ENCOUNTER — Telehealth: Payer: Self-pay

## 2022-02-03 NOTE — Telephone Encounter (Signed)
Patient called and wanted to let Dr. Candiss Norse know that she has been admitted to [facility], and expects to be there at least a few days.  Routed to provider.  Binnie Kand, RN

## 2022-02-09 ENCOUNTER — Ambulatory Visit: Payer: Medicare Other | Admitting: Physician Assistant

## 2022-02-14 ENCOUNTER — Other Ambulatory Visit (HOSPITAL_COMMUNITY): Payer: Self-pay

## 2022-02-14 MED ORDER — OXYCODONE HCL 5 MG PO TABS
ORAL_TABLET | ORAL | 0 refills | Status: DC
  Filled 2022-02-14: qty 20, 4d supply, fill #0
  Filled 2022-02-14: qty 20, 5d supply, fill #0

## 2022-02-17 ENCOUNTER — Ambulatory Visit (HOSPITAL_COMMUNITY)
Admission: RE | Admit: 2022-02-17 | Discharge: 2022-02-17 | Disposition: A | Discharge status: Home / Self Care | Payer: 59 | Source: Ambulatory Visit | Attending: Radiology | Admitting: Radiology

## 2022-02-17 ENCOUNTER — Other Ambulatory Visit (HOSPITAL_COMMUNITY): Payer: Self-pay | Admitting: Radiology

## 2022-02-17 DIAGNOSIS — C680 Malignant neoplasm of urethra: Secondary | ICD-10-CM

## 2022-02-17 DIAGNOSIS — Z436 Encounter for attention to other artificial openings of urinary tract: Secondary | ICD-10-CM | POA: Insufficient documentation

## 2022-02-17 HISTORY — PX: IR EXT NEPHROURETERAL CATH EXCHANGE: IMG5418

## 2022-02-17 MED ORDER — IOHEXOL 300 MG/ML  SOLN
50.0000 mL | Freq: Once | INTRAMUSCULAR | Status: AC | PRN
  Administered 2022-02-17: 10 mL

## 2022-02-17 NOTE — Procedures (Signed)
Interventional Radiology Procedure Note  Procedure: fluoro bilateral nephroureteral cath exchgs    Complications: None  Estimated Blood Loss:  min  Findings: 12 fr    M. Daryll Brod, MD

## 2022-02-23 ENCOUNTER — Other Ambulatory Visit (HOSPITAL_COMMUNITY): Payer: Self-pay

## 2022-02-23 MED ORDER — OXYCODONE HCL 5 MG PO TABS
ORAL_TABLET | ORAL | 0 refills | Status: DC
  Filled 2022-02-23: qty 20, 5d supply, fill #0

## 2022-02-28 ENCOUNTER — Ambulatory Visit: Payer: 59 | Admitting: Internal Medicine

## 2022-03-15 ENCOUNTER — Other Ambulatory Visit (HOSPITAL_COMMUNITY): Payer: Self-pay

## 2022-03-15 MED ORDER — OXYCODONE HCL 5 MG PO TABS
5.0000 mg | ORAL_TABLET | Freq: Four times a day (QID) | ORAL | 0 refills | Status: DC | PRN
  Filled 2022-03-15: qty 120, 30d supply, fill #0

## 2022-03-17 ENCOUNTER — Other Ambulatory Visit (HOSPITAL_COMMUNITY): Payer: Self-pay

## 2022-03-17 MED ORDER — ATORVASTATIN CALCIUM 40 MG PO TABS
40.0000 mg | ORAL_TABLET | Freq: Every day | ORAL | 3 refills | Status: DC
  Filled 2022-03-17: qty 90, 90d supply, fill #0

## 2022-03-31 ENCOUNTER — Ambulatory Visit (HOSPITAL_COMMUNITY)
Admission: RE | Admit: 2022-03-31 | Discharge: 2022-03-31 | Disposition: A | Discharge status: Home / Self Care | Payer: 59 | Source: Ambulatory Visit | Attending: Radiology | Admitting: Radiology

## 2022-03-31 ENCOUNTER — Other Ambulatory Visit (HOSPITAL_COMMUNITY): Payer: Self-pay | Admitting: Radiology

## 2022-03-31 DIAGNOSIS — C680 Malignant neoplasm of urethra: Secondary | ICD-10-CM | POA: Insufficient documentation

## 2022-03-31 DIAGNOSIS — N1330 Unspecified hydronephrosis: Secondary | ICD-10-CM | POA: Insufficient documentation

## 2022-03-31 DIAGNOSIS — Z436 Encounter for attention to other artificial openings of urinary tract: Secondary | ICD-10-CM | POA: Insufficient documentation

## 2022-03-31 HISTORY — PX: IR EXT NEPHROURETERAL CATH EXCHANGE: IMG5418

## 2022-03-31 MED ORDER — IOHEXOL 300 MG/ML  SOLN
50.0000 mL | Freq: Once | INTRAMUSCULAR | Status: AC | PRN
  Administered 2022-03-31: 10 mL

## 2022-03-31 MED ORDER — LIDOCAINE HCL 1 % IJ SOLN
INTRAMUSCULAR | Status: AC
  Filled 2022-03-31: qty 20

## 2022-04-22 ENCOUNTER — Other Ambulatory Visit (HOSPITAL_COMMUNITY): Payer: Self-pay

## 2022-04-22 MED ORDER — OXYCODONE HCL 5 MG PO TABS
5.0000 mg | ORAL_TABLET | Freq: Four times a day (QID) | ORAL | 0 refills | Status: DC | PRN
  Filled 2022-04-22: qty 120, 30d supply, fill #0

## 2022-04-23 ENCOUNTER — Other Ambulatory Visit (HOSPITAL_COMMUNITY): Payer: Self-pay

## 2022-05-11 ENCOUNTER — Other Ambulatory Visit (HOSPITAL_COMMUNITY): Payer: Self-pay

## 2022-05-11 MED ORDER — DULOXETINE HCL 30 MG PO CPEP
30.0000 mg | ORAL_CAPSULE | Freq: Every day | ORAL | 3 refills | Status: DC
  Filled 2022-05-11: qty 90, 90d supply, fill #0

## 2022-05-12 ENCOUNTER — Other Ambulatory Visit (HOSPITAL_COMMUNITY): Payer: Self-pay | Admitting: Radiology

## 2022-05-12 ENCOUNTER — Ambulatory Visit (HOSPITAL_COMMUNITY)
Admission: RE | Admit: 2022-05-12 | Discharge: 2022-05-12 | Disposition: A | Discharge status: Home / Self Care | Payer: 59 | Source: Ambulatory Visit | Attending: Radiology | Admitting: Radiology

## 2022-05-12 ENCOUNTER — Ambulatory Visit: Payer: 59 | Admitting: Cardiovascular Disease

## 2022-05-12 DIAGNOSIS — C680 Malignant neoplasm of urethra: Secondary | ICD-10-CM | POA: Insufficient documentation

## 2022-05-12 DIAGNOSIS — Z436 Encounter for attention to other artificial openings of urinary tract: Secondary | ICD-10-CM | POA: Insufficient documentation

## 2022-05-12 HISTORY — PX: IR EXT NEPHROURETERAL CATH EXCHANGE: IMG5418

## 2022-05-12 MED ORDER — IOHEXOL 300 MG/ML  SOLN
10.0000 mL | Freq: Once | INTRAMUSCULAR | Status: AC | PRN
  Administered 2022-05-12: 10 mL

## 2022-05-12 NOTE — Procedures (Signed)
Pre Procedure Dx: Post cystectomy.  Post Procedure Dx: Same  Successful exchange of bilateral 12 Fr retrograde nephroureteral catheters.  EBL: None No immediate complications.   Katherina Right, MD Pager #: [phone]

## 2022-05-16 ENCOUNTER — Ambulatory Visit: Payer: 59 | Admitting: Cardiovascular Disease

## 2022-05-23 ENCOUNTER — Other Ambulatory Visit (HOSPITAL_COMMUNITY): Payer: Self-pay

## 2022-05-23 MED ORDER — PROLIA 60 MG/ML ~~LOC~~ SOSY
PREFILLED_SYRINGE | SUBCUTANEOUS | 2 refills | Status: DC
  Filled 2022-05-23 – 2022-05-24 (×2): qty 1, 180d supply, fill #0
  Filled 2023-02-25 – 2023-03-01 (×3): qty 1, 180d supply, fill #1

## 2022-05-24 ENCOUNTER — Other Ambulatory Visit: Payer: Self-pay

## 2022-05-24 ENCOUNTER — Other Ambulatory Visit (HOSPITAL_COMMUNITY): Payer: Self-pay

## 2022-05-25 ENCOUNTER — Other Ambulatory Visit (HOSPITAL_COMMUNITY): Payer: Self-pay

## 2022-05-25 MED ORDER — OXYCODONE HCL 5 MG PO TABS
5.0000 mg | ORAL_TABLET | Freq: Four times a day (QID) | ORAL | 0 refills | Status: DC | PRN
  Filled 2022-05-25: qty 120, 30d supply, fill #0

## 2022-06-01 ENCOUNTER — Ambulatory Visit: Payer: 59 | Admitting: Cardiovascular Disease

## 2022-06-27 ENCOUNTER — Other Ambulatory Visit (HOSPITAL_COMMUNITY): Payer: Self-pay

## 2022-06-27 MED ORDER — OXYCODONE HCL 5 MG PO TABS
5.0000 mg | ORAL_TABLET | Freq: Four times a day (QID) | ORAL | 0 refills | Status: DC | PRN
  Filled 2022-06-27: qty 120, 30d supply, fill #0

## 2022-06-28 NOTE — Progress Notes (Unsigned)
Cardiology Office Note:   Date:  06/30/2022  NAME:  Shelia Chan    MRN: [id number] DOB:  1955/09/17   PCP:  Ellyn Hack, MD  Cardiologist:  Reatha Harps, MD  Electrophysiologist:  None   Referring MD: Ellyn Hack, MD   Chief Complaint  Patient presents with   Follow-up        History of Present Illness:   Shelia Chan is a 67 y.o. female with a hx of CKD IV, COPD, A tach who presents for follow-up.  She was diagnosed with pubic symphysis osteomyelitis.  Treated medically.  Seems to be doing well.  Off antibiotics.  She reports she continues to have palpitations.  They occur with activity.  Known history of atrial tachycardia.  We did discuss retrying AV nodal agents to see if this helps.  She was on metoprolol in the past with some improvement.  BP a bit low today but she tells me this is not normal.  Denies any lightheadedness or dizziness.  We discussed trial of a calcium channel blocker.  She is okay to retry this.  CV exam unremarkable.  She does have some evidence of lower extremity edema.  She takes Lasix.  CKD remains stage IV.  Problem List 1. CKD IV 2. COPD 3. Bladder CA s/p cystectomy and conduit  4. Atrial tachycardia  -6 episodes in 3 days; longest 6.8 seconds 5. Lymphedema  6. Pubic bone osteomyelitis   Past Medical History: Past Medical History:  Diagnosis Date   Arthritis    Asthma    Blood transfusion without reported diagnosis    Cancer (HCC)    CHF (congestive heart failure) (HCC)    COPD (chronic obstructive pulmonary disease) (HCC)    Heart murmur    Immune deficiency disorder (HCC)    Renal disorder     Past Surgical History: Past Surgical History:  Procedure Laterality Date   BLADDER REMOVAL  118/22/2015   FRACTURE SURGERY Left 02/2018   pins   IR CATHETER TUBE CHANGE  04/15/2019   IR CATHETER TUBE CHANGE  04/15/2019   IR EXT NEPHROURETERAL CATH EXCHANGE  07/15/2019   IR EXT NEPHROURETERAL CATH EXCHANGE  10/14/2019   IR EXT  NEPHROURETERAL CATH EXCHANGE  10/14/2019   IR EXT NEPHROURETERAL CATH EXCHANGE  01/06/2020   IR EXT NEPHROURETERAL CATH EXCHANGE  03/16/2020   IR EXT NEPHROURETERAL CATH EXCHANGE  05/25/2020   IR EXT NEPHROURETERAL CATH EXCHANGE  07/17/2020   IR EXT NEPHROURETERAL CATH EXCHANGE  07/17/2020   IR EXT NEPHROURETERAL CATH EXCHANGE  08/14/2020   IR EXT NEPHROURETERAL CATH EXCHANGE  09/25/2020   IR EXT NEPHROURETERAL CATH EXCHANGE  11/06/2020   IR EXT NEPHROURETERAL CATH EXCHANGE  12/25/2020   IR EXT NEPHROURETERAL CATH EXCHANGE  02/09/2021   IR EXT NEPHROURETERAL CATH EXCHANGE  03/19/2021   IR EXT NEPHROURETERAL CATH EXCHANGE  03/19/2021   IR EXT NEPHROURETERAL CATH EXCHANGE  04/23/2021   IR EXT NEPHROURETERAL CATH EXCHANGE  04/23/2021   IR EXT NEPHROURETERAL CATH EXCHANGE  06/04/2021   IR EXT NEPHROURETERAL CATH EXCHANGE  07/15/2021   IR EXT NEPHROURETERAL CATH EXCHANGE  09/02/2021   IR EXT NEPHROURETERAL CATH EXCHANGE  10/14/2021   IR EXT NEPHROURETERAL CATH EXCHANGE  11/25/2021   IR EXT NEPHROURETERAL CATH EXCHANGE  01/06/2022   IR EXT NEPHROURETERAL CATH EXCHANGE  02/17/2022   IR EXT NEPHROURETERAL CATH EXCHANGE  03/31/2022   IR EXT NEPHROURETERAL CATH EXCHANGE  05/12/2022   IR EXT  NEPHROURETERAL CATH EXCHANGE  06/30/2022   IR PERC TUN PERIT CATH WO PORT S&I /IMAG  03/25/2021   IR REMOVAL TUN CV CATH W/O FL  06/04/2021    Current Medications: Current Meds  Medication Sig   acetaminophen (TYLENOL) 500 MG tablet Take 500-1,000 mg by mouth every 6 (six) hours as needed for moderate pain or fever.   albuterol (VENTOLIN HFA) 108 (90 Base) MCG/ACT inhaler Inhale 1 puff into the lungs every 6 (six) hours as needed for shortness of breath.   ARIPiprazole (ABILIFY) 30 MG tablet Take 30 mg by mouth daily.   aspirin EC 81 MG tablet Take 81 mg by mouth daily.   atorvastatin (LIPITOR) 10 MG tablet Take 10 mg by mouth at bedtime.   atorvastatin (LIPITOR) 40 MG tablet Take 1 tablet daily at bedtime.   B Complex-C (B-COMPLEX  WITH VITAMIN C) tablet Take 1 tablet by mouth daily.   busPIRone (BUSPAR) 7.5 MG tablet Take 7.5 mg by mouth 2 (two) times daily as needed (anxiety).   calcitRIOL (ROCALTROL) 0.25 MCG capsule Take 0.25 mcg by mouth daily.   Cholecalciferol (VITAMIN D-3) 25 MCG (1000 UT) CAPS Take 2,000 Units by mouth daily.   denosumab (PROLIA) 60 MG/ML SOSY injection Inject 60 mg [place] every 6 months   diltiazem (CARDIZEM CD) 120 MG 24 hr capsule Take 1 capsule (120 mg total) by mouth daily.   DULoxetine (CYMBALTA) 30 MG capsule Take 30 mg by mouth at bedtime.   ferrous sulfate 324 MG TBEC Take 324 mg by mouth daily with breakfast.   furosemide (LASIX) 40 MG tablet Take 40 mg by mouth daily.   HYDROcodone-acetaminophen (NORCO/VICODIN) 5-325 MG tablet Take 1 tablet by mouth every 8 hours as needed for pain   HYDROcodone-acetaminophen (NORCO/VICODIN) 5-325 MG tablet Take 1 tablet by mouth every 8 (eight) hours as needed for pain   HYDROcodone-acetaminophen (NORCO/VICODIN) 5-325 MG tablet Take 1 tablet by mouth every 8 hours as needed for pain   methocarbamol (ROBAXIN) 500 MG tablet Take 500 mg by mouth 3 (three) times daily as needed for muscle spasms.   Multiple Vitamin (MULTIVITAMIN ADULT PO) Take 1 tablet by mouth daily.   oxyCODONE (OXY IR/ROXICODONE) 5 MG immediate release tablet Take 1 tablet by mouth every 6 hours as needed for severe pain   oxyCODONE (OXY IR/ROXICODONE) 5 MG immediate release tablet Take 1 tablet (5 mg total) by mouth every 6 (six) hours as needed for severe pain   pantoprazole (PROTONIX) 40 MG tablet Take 40 mg by mouth daily.   potassium chloride (KLOR-CON) 20 MEQ packet Take 20 mEq by mouth daily.   Probiotic Product (PROBIOTIC PO) Take 1 capsule by mouth daily.   vitamin C (ASCORBIC ACID) 500 MG tablet Take 500 mg by mouth 2 (two) times a week.     Allergies:    Metoprolol and Shellfish-derived products   Social History: Social History   Socioeconomic History   Marital status:  Divorced    Spouse name: Not on file   Number of children: Not on file   Years of education: Not on file   Highest education level: Not on file  Occupational History   Occupation: disabled  Tobacco Use   Smoking status: Some Days    Packs/day: 1.00    Years: 50.00    Additional pack years: 0.00    Total pack years: 50.00    Types: Cigarettes, E-cigarettes    Last attempt to quit: 01/21/2019    Years since  quitting: 3.4   Smokeless tobacco: Never   Tobacco comments:    Quit smoking but vapes occasionally  Vaping Use   Vaping Use: Never used  Substance and Sexual Activity   Alcohol use: Not Currently   Drug use: Yes    Types: Marijuana    Comment: occasional    Sexual activity: Not on file  Other Topics Concern   Not on file  Social History Narrative   Not on file   Social Determinants of Health   Financial Resource Strain: Not on file  Food Insecurity: Not on file  Transportation Needs: Not on file  Physical Activity: Not on file  Stress: Not on file  Social Connections: Not on file     Family History: The patient's family history includes Heart disease in her maternal grandfather.  ROS:   All other ROS reviewed and negative. Pertinent positives noted in the HPI.     EKGs/Labs/Other Studies Reviewed:   The following studies were personally reviewed by me today:  TTE 03/18/2021 1. Left ventricular ejection fraction, by estimation, is 65 to 70%. Left  ventricular ejection fraction by 2D MOD biplane is 68.2 %. The left  ventricle has normal function. The left ventricle has no regional wall  motion abnormalities. There is mild left  ventricular hypertrophy. Left ventricular diastolic parameters are  consistent with Grade I diastolic dysfunction (impaired relaxation).   2. Right ventricular systolic function is normal. The right ventricular  size is normal. There is normal pulmonary artery systolic pressure. The  estimated right ventricular systolic pressure is 34.2  mmHg.   3. The mitral valve is grossly normal. Trivial mitral valve  regurgitation.   4. The aortic valve is abnormal. There is moderate thickening of the  aortic valve. Aortic valve regurgitation is mild to moderate.   5. The inferior vena cava is dilated in size with <50% respiratory  variability, suggesting right atrial pressure of 15 mmHg.   Recent Labs: 01/27/2022: ALT 6; BUN 20; Creat 2.05; Hemoglobin 12.4; Platelets 597; Potassium 4.1; Sodium 139   Recent Lipid Panel No results found for: "CHOL", "TRIG", "HDL", "CHOLHDL", "VLDL", "LDLCALC", "LDLDIRECT"  Physical Exam:   VS:  BP (!) 92/57 (BP Location: Left Arm, Patient Position: Sitting, Cuff Size: Normal)   Pulse 64   Ht 5\' 1"  (1.549 m)   Wt 130 lb (59 kg)   SpO2 99%   BMI 24.56 kg/m    Wt Readings from Last 3 Encounters:  06/30/22 130 lb (59 kg)  01/27/22 133 lb 3.2 oz (60.4 kg)  10/28/21 132 lb 11.2 oz (60.2 kg)    General: Well nourished, well developed, in no acute distress Head: Atraumatic, normal size  Eyes: PEERLA, EOMI  Neck: Supple, no JVD Endocrine: No thryomegaly Cardiac: Normal S1, S2; RRR; no murmurs, rubs, or gallops Lungs: Clear to auscultation bilaterally, no wheezing, rhonchi or rales  Abd: Soft, nontender, no hepatomegaly  Ext: Trace edema Musculoskeletal: No deformities, BUE and BLE strength normal and equal Skin: Warm and dry, no rashes   Neuro: Alert and oriented to person, place, time, and situation, CNII-XII grossly intact, no focal deficits  Psych: Normal mood and affect   ASSESSMENT:   Kalina Kammer is a 67 y.o. female who presents for the following: 1. Atrial tachycardia   2. Palpitations     PLAN:   1. Atrial tachycardia 2. Palpitations -Known history of atrial tachycardia.  Now with exertional palpitations.  We will give her a trial of diltiazem 120  mg extended release.  Blood pressure is a bit low today.  She tells me this is not normal.  Will keep a close eye on things.  If she  does develop lower blood pressure with allergist take her diltiazem at night.  All of her labs seem to be stable.  CV exam unremarkable.  Recent labs within limits.      Disposition: Return in about 3 months (around 09/30/2022).  Medication Adjustments/Labs and Tests Ordered: Current medicines are reviewed at length with the patient today.  Concerns regarding medicines are outlined above.  No orders of the defined types were placed in this encounter.  Meds ordered this encounter  Medications   diltiazem (CARDIZEM CD) 120 MG 24 hr capsule    Sig: Take 1 capsule (120 mg total) by mouth daily.    Dispense:  90 capsule    Refill:  3    Patient Instructions  Medication Instructions:  START Diltiazem 120 mg daily   *If you need a refill on your cardiac medications before your next appointment, please call your pharmacy*   Follow-Up: At [facility], you and your health needs are our priority.  As part of our continuing mission to provide you with exceptional heart care, we have created designated Provider Care Teams.  These Care Teams include your primary Cardiologist (physician) and Advanced Practice Providers (APPs -  Physician Assistants and Nurse Practitioners) who all work together to provide you with the care you need, when you need it.  We recommend signing up for the patient portal called "MyChart".  Sign up information is provided on this After Visit Summary.  MyChart is used to connect with patients for Virtual Visits (Telemedicine).  Patients are able to view lab/test results, encounter notes, upcoming appointments, etc.  Non-urgent messages can be sent to your provider as well.   To learn more about what you can do with MyChart, go to [email or web address].    Your next appointment:   3 month(s)  Provider:   Reatha Harps, MD          Time Spent with Patient: I have spent a total of 25 minutes with patient reviewing hospital notes, telemetry, EKGs, labs and  examining the patient as well as establishing an assessment and plan that was discussed with the patient.  > 50% of time was spent in direct patient care.  Signed, Lenna Gilford. Flora Lipps, MD, [facility]  [facility]  [address], [address] [place], [place] [address] [phone]  06/30/2022 6:42 PM

## 2022-06-30 ENCOUNTER — Other Ambulatory Visit (HOSPITAL_COMMUNITY): Payer: Self-pay | Admitting: Radiology

## 2022-06-30 ENCOUNTER — Ambulatory Visit
Admission: RE | Admit: 2022-06-30 | Discharge: 2022-06-30 | Disposition: A | Discharge status: Home / Self Care | Payer: 59 | Source: Ambulatory Visit | Attending: Interventional Radiology | Admitting: Interventional Radiology

## 2022-06-30 ENCOUNTER — Ambulatory Visit (INDEPENDENT_AMBULATORY_CARE_PROVIDER_SITE_OTHER): Payer: 59 | Admitting: Cardiovascular Disease

## 2022-06-30 VITALS — BP 92/57 | HR 64 | Ht 61.0 in | Wt 130.0 lb

## 2022-06-30 DIAGNOSIS — I4719 Other supraventricular tachycardia: Secondary | ICD-10-CM

## 2022-06-30 DIAGNOSIS — R002 Palpitations: Secondary | ICD-10-CM

## 2022-06-30 DIAGNOSIS — M868X8 Other osteomyelitis, other site: Secondary | ICD-10-CM | POA: Diagnosis not present

## 2022-06-30 DIAGNOSIS — I890 Lymphedema, not elsewhere classified: Secondary | ICD-10-CM | POA: Diagnosis not present

## 2022-06-30 DIAGNOSIS — C680 Malignant neoplasm of urethra: Secondary | ICD-10-CM

## 2022-06-30 DIAGNOSIS — Z87891 Personal history of nicotine dependence: Secondary | ICD-10-CM | POA: Diagnosis not present

## 2022-06-30 DIAGNOSIS — Z79899 Other long term (current) drug therapy: Secondary | ICD-10-CM | POA: Insufficient documentation

## 2022-06-30 DIAGNOSIS — J449 Chronic obstructive pulmonary disease, unspecified: Secondary | ICD-10-CM | POA: Insufficient documentation

## 2022-06-30 DIAGNOSIS — N184 Chronic kidney disease, stage 4 (severe): Secondary | ICD-10-CM | POA: Insufficient documentation

## 2022-06-30 DIAGNOSIS — Z436 Encounter for attention to other artificial openings of urinary tract: Secondary | ICD-10-CM | POA: Diagnosis present

## 2022-06-30 DIAGNOSIS — Z8551 Personal history of malignant neoplasm of bladder: Secondary | ICD-10-CM | POA: Insufficient documentation

## 2022-06-30 HISTORY — PX: IR EXT NEPHROURETERAL CATH EXCHANGE: IMG5418

## 2022-06-30 MED ORDER — DILTIAZEM HCL ER COATED BEADS 120 MG PO CP24: 120.0000 mg | ORAL_CAPSULE | Freq: Every day | ORAL | 3 refills | Status: DC

## 2022-06-30 MED ORDER — IOHEXOL 300 MG/ML  SOLN
50.0000 mL | Freq: Once | INTRAMUSCULAR | Status: AC | PRN
  Administered 2022-06-30: 15 mL

## 2022-06-30 NOTE — Patient Instructions (Signed)
Medication Instructions:  START Diltiazem 120 mg daily   *If you need a refill on your cardiac medications before your next appointment, please call your pharmacy*   Follow-Up: At [facility], you and your health needs are our priority.  As part of our continuing mission to provide you with exceptional heart care, we have created designated Provider Care Teams.  These Care Teams include your primary Cardiologist (physician) and Advanced Practice Providers (APPs -  Physician Assistants and Nurse Practitioners) who all work together to provide you with the care you need, when you need it.  We recommend signing up for the patient portal called "MyChart".  Sign up information is provided on this After Visit Summary.  MyChart is used to connect with patients for Virtual Visits (Telemedicine).  Patients are able to view lab/test results, encounter notes, upcoming appointments, etc.  Non-urgent messages can be sent to your provider as well.   To learn more about what you can do with MyChart, go to [email or web address].    Your next appointment:   3 month(s)  Provider:   Reatha Harps, MD

## 2022-07-24 ENCOUNTER — Encounter (HOSPITAL_COMMUNITY): Payer: Self-pay

## 2022-07-24 ENCOUNTER — Inpatient Hospital Stay (HOSPITAL_COMMUNITY)
Admission: EM | Admit: 2022-07-24 | Discharge: 2022-07-28 | DRG: 690 | Disposition: A | Discharge status: Home / Self Care | Payer: 59 | Attending: Internal Medicine | Admitting: Internal Medicine

## 2022-07-24 ENCOUNTER — Emergency Department (HOSPITAL_COMMUNITY): Payer: 59

## 2022-07-24 ENCOUNTER — Other Ambulatory Visit: Payer: Self-pay

## 2022-07-24 DIAGNOSIS — E872 Acidosis, unspecified: Secondary | ICD-10-CM | POA: Diagnosis present

## 2022-07-24 DIAGNOSIS — J4489 Other specified chronic obstructive pulmonary disease: Secondary | ICD-10-CM | POA: Diagnosis present

## 2022-07-24 DIAGNOSIS — E871 Hypo-osmolality and hyponatremia: Secondary | ICD-10-CM | POA: Diagnosis present

## 2022-07-24 DIAGNOSIS — R109 Unspecified abdominal pain: Secondary | ICD-10-CM

## 2022-07-24 DIAGNOSIS — E861 Hypovolemia: Secondary | ICD-10-CM | POA: Diagnosis present

## 2022-07-24 DIAGNOSIS — F17290 Nicotine dependence, other tobacco product, uncomplicated: Secondary | ICD-10-CM | POA: Diagnosis present

## 2022-07-24 DIAGNOSIS — E876 Hypokalemia: Secondary | ICD-10-CM | POA: Diagnosis present

## 2022-07-24 DIAGNOSIS — N390 Urinary tract infection, site not specified: Principal | ICD-10-CM | POA: Diagnosis present

## 2022-07-24 DIAGNOSIS — Z87440 Personal history of urinary (tract) infections: Secondary | ICD-10-CM

## 2022-07-24 DIAGNOSIS — A045 Campylobacter enteritis: Secondary | ICD-10-CM | POA: Diagnosis present

## 2022-07-24 DIAGNOSIS — F17210 Nicotine dependence, cigarettes, uncomplicated: Secondary | ICD-10-CM | POA: Diagnosis present

## 2022-07-24 DIAGNOSIS — Z888 Allergy status to other drugs, medicaments and biological substances status: Secondary | ICD-10-CM

## 2022-07-24 DIAGNOSIS — Z7982 Long term (current) use of aspirin: Secondary | ICD-10-CM

## 2022-07-24 DIAGNOSIS — D631 Anemia in chronic kidney disease: Secondary | ICD-10-CM | POA: Diagnosis present

## 2022-07-24 DIAGNOSIS — K219 Gastro-esophageal reflux disease without esophagitis: Secondary | ICD-10-CM | POA: Diagnosis present

## 2022-07-24 DIAGNOSIS — E860 Dehydration: Secondary | ICD-10-CM | POA: Diagnosis present

## 2022-07-24 DIAGNOSIS — Z8619 Personal history of other infectious and parasitic diseases: Secondary | ICD-10-CM

## 2022-07-24 DIAGNOSIS — E785 Hyperlipidemia, unspecified: Secondary | ICD-10-CM | POA: Diagnosis present

## 2022-07-24 DIAGNOSIS — I890 Lymphedema, not elsewhere classified: Secondary | ICD-10-CM | POA: Diagnosis present

## 2022-07-24 DIAGNOSIS — Z8249 Family history of ischemic heart disease and other diseases of the circulatory system: Secondary | ICD-10-CM

## 2022-07-24 DIAGNOSIS — Z906 Acquired absence of other parts of urinary tract: Secondary | ICD-10-CM

## 2022-07-24 DIAGNOSIS — Z79899 Other long term (current) drug therapy: Secondary | ICD-10-CM

## 2022-07-24 DIAGNOSIS — Z8551 Personal history of malignant neoplasm of bladder: Secondary | ICD-10-CM

## 2022-07-24 DIAGNOSIS — N184 Chronic kidney disease, stage 4 (severe): Secondary | ICD-10-CM | POA: Diagnosis present

## 2022-07-24 DIAGNOSIS — N179 Acute kidney failure, unspecified: Secondary | ICD-10-CM | POA: Diagnosis present

## 2022-07-24 DIAGNOSIS — I5032 Chronic diastolic (congestive) heart failure: Secondary | ICD-10-CM | POA: Diagnosis present

## 2022-07-24 DIAGNOSIS — Z91013 Allergy to seafood: Secondary | ICD-10-CM

## 2022-07-24 DIAGNOSIS — N200 Calculus of kidney: Secondary | ICD-10-CM | POA: Diagnosis present

## 2022-07-24 DIAGNOSIS — Z8559 Personal history of malignant neoplasm of other urinary tract organ: Secondary | ICD-10-CM

## 2022-07-24 LAB — COMPREHENSIVE METABOLIC PANEL WITH GFR
ALT: 15 U/L (ref 0–44)
AST: 16 U/L (ref 15–41)
Albumin: 3.4 g/dL — ABNORMAL LOW (ref 3.5–5.0)
Alkaline Phosphatase: 113 U/L (ref 38–126)
Anion gap: 13 (ref 5–15)
BUN: 33 mg/dL — ABNORMAL HIGH (ref 8–23)
CO2: 22 mmol/L (ref 22–32)
Calcium: 8.7 mg/dL — ABNORMAL LOW (ref 8.9–10.3)
Chloride: 92 mmol/L — ABNORMAL LOW (ref 98–111)
Creatinine, Ser: 2.61 mg/dL — ABNORMAL HIGH (ref 0.44–1.00)
GFR, Estimated: 19 mL/min — ABNORMAL LOW (ref >60)
Glucose, Bld: 99 mg/dL (ref 70–99)
Potassium: 3.1 mmol/L — ABNORMAL LOW (ref 3.5–5.1)
Sodium: 127 mmol/L — ABNORMAL LOW (ref 135–145)
Total Bilirubin: 0.5 mg/dL (ref 0.3–1.2)
Total Protein: 7.2 g/dL (ref 6.5–8.1)

## 2022-07-24 LAB — URINALYSIS, ROUTINE W REFLEX MICROSCOPIC
Bilirubin Urine: NEGATIVE
Glucose, UA: NEGATIVE mg/dL
Hgb urine dipstick: NEGATIVE
Ketones, ur: NEGATIVE mg/dL
Nitrite: NEGATIVE
Protein, ur: 100 mg/dL — AB
Specific Gravity, Urine: 1.008 (ref 1.005–1.030)
Trans Epithel, UA: 1
WBC, UA: >50 WBC/hpf (ref 0–5)
pH: 5 (ref 5.0–8.0)

## 2022-07-24 LAB — CBC
HCT: 31.9 % — ABNORMAL LOW (ref 36.0–46.0)
Hemoglobin: 10.4 g/dL — ABNORMAL LOW (ref 12.0–15.0)
MCH: 30.8 pg (ref 26.0–34.0)
MCHC: 32.6 g/dL (ref 30.0–36.0)
MCV: 94.4 fL (ref 80.0–100.0)
Platelets: 431 K/uL — ABNORMAL HIGH (ref 150–400)
RBC: 3.38 MIL/uL — ABNORMAL LOW (ref 3.87–5.11)
RDW: 12.9 % (ref 11.5–15.5)
WBC: 11 K/uL — ABNORMAL HIGH (ref 4.0–10.5)
nRBC: 0 % (ref 0.0–0.2)

## 2022-07-24 LAB — SEDIMENTATION RATE: Sed Rate: 50 mm/hr — ABNORMAL HIGH (ref 0–22)

## 2022-07-24 LAB — LACTIC ACID, PLASMA: Lactic Acid, Venous: 1.5 mmol/L (ref 0.5–1.9)

## 2022-07-24 MED ORDER — PANTOPRAZOLE SODIUM 40 MG PO TBEC
40.0000 mg | DELAYED_RELEASE_TABLET | Freq: Every day | ORAL | Status: DC
  Administered 2022-07-25 – 2022-07-28 (×4): 40 mg via ORAL
  Filled 2022-07-24 (×4): qty 1

## 2022-07-24 MED ORDER — MELATONIN 5 MG PO TABS
5.0000 mg | ORAL_TABLET | Freq: Every evening | ORAL | Status: DC | PRN
  Administered 2022-07-25 – 2022-07-27 (×3): 5 mg via ORAL
  Filled 2022-07-24 (×3): qty 1

## 2022-07-24 MED ORDER — SODIUM CHLORIDE 0.9 % IV SOLN
1.0000 g | Freq: Two times a day (BID) | INTRAVENOUS | Status: DC
  Administered 2022-07-25 – 2022-07-28 (×8): 1 g via INTRAVENOUS
  Filled 2022-07-24 (×8): qty 20

## 2022-07-24 MED ORDER — ATORVASTATIN CALCIUM 40 MG PO TABS
40.0000 mg | ORAL_TABLET | Freq: Every day | ORAL | Status: DC
  Administered 2022-07-25 – 2022-07-27 (×3): 40 mg via ORAL
  Filled 2022-07-24 (×3): qty 1

## 2022-07-24 MED ORDER — POLYETHYLENE GLYCOL 3350 17 G PO PACK: 17.0000 g | PACK | Freq: Every day | ORAL | Status: DC | PRN

## 2022-07-24 MED ORDER — ASPIRIN 81 MG PO TBEC
81.0000 mg | DELAYED_RELEASE_TABLET | Freq: Every day | ORAL | Status: DC
  Administered 2022-07-25 – 2022-07-28 (×4): 81 mg via ORAL
  Filled 2022-07-24 (×4): qty 1

## 2022-07-24 MED ORDER — SODIUM CHLORIDE 0.9 % IV SOLN: Freq: Once | INTRAVENOUS | Status: AC

## 2022-07-24 MED ORDER — ENOXAPARIN SODIUM 30 MG/0.3ML IJ SOSY
30.0000 mg | PREFILLED_SYRINGE | INTRAMUSCULAR | Status: DC
  Filled 2022-07-24 (×4): qty 0.3

## 2022-07-24 MED ORDER — ACETAMINOPHEN 325 MG PO TABS
650.0000 mg | ORAL_TABLET | Freq: Four times a day (QID) | ORAL | Status: DC | PRN
  Administered 2022-07-25 – 2022-07-28 (×3): 650 mg via ORAL
  Filled 2022-07-24 (×3): qty 2

## 2022-07-24 MED ORDER — POTASSIUM CHLORIDE IN NACL 40-0.9 MEQ/L-% IV SOLN
INTRAVENOUS | Status: DC
  Filled 2022-07-24: qty 1000

## 2022-07-24 MED ORDER — PROCHLORPERAZINE EDISYLATE 10 MG/2ML IJ SOLN: 5.0000 mg | Freq: Four times a day (QID) | INTRAMUSCULAR | Status: DC | PRN

## 2022-07-24 NOTE — ED Notes (Signed)
Pt ambulatory without assistance.  

## 2022-07-24 NOTE — ED Provider Notes (Addendum)
[place] EMERGENCY DEPARTMENT AT [facility] Provider Note   CSN: [id number] Arrival date & time: 07/24/22  2052     History  Chief Complaint  Patient presents with   Flank Pain    Shelia Chan is a 67 y.o. female.  67 year old female with prior medical history as detailed below presents for evaluation.  Patient reports left-sided flank discomfort x 24 hours.  Patient with complex medical history including urostomy and bilateral ureteral stents.  Patient is known to Dr. Arita Miss with [facility].  Patient reports that similar symptoms in the past have been a sign of urinary tract infection.  She denies fever.  She denies abdominal pain.  She feels that her urine output from urostomy is slightly cloudy.  She is reporting that the last time she was on antibiotics for urinary infection was approximately 12 months ago.  She cannot recall exactly what antibiotic she was administered.  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  The history is provided by the patient and medical records.       Home Medications Prior to Admission medications   Medication Sig Start Date End Date Taking? Authorizing Provider  acetaminophen (TYLENOL) 500 MG tablet Take 500-1,000 mg by mouth every 6 (six) hours as needed for moderate pain or fever.    [provider]  albuterol (VENTOLIN HFA) 108 (90 Base) MCG/ACT inhaler Inhale 1 puff into the lungs every 6 (six) hours as needed for shortness of breath. 05/22/20   [provider]  ARIPiprazole (ABILIFY) 30 MG tablet Take 30 mg by mouth daily. 03/19/19   [provider]  aspirin EC 81 MG tablet Take 81 mg by mouth daily.    [provider]  atorvastatin (LIPITOR) 10 MG tablet Take 10 mg by  mouth at bedtime. 02/07/21   [provider]  atorvastatin (LIPITOR) 40 MG tablet Take 1 tablet daily at bedtime. 03/17/22     B Complex-C (B-COMPLEX WITH VITAMIN C) tablet Take 1 tablet by mouth daily.    [provider]  busPIRone (BUSPAR) 7.5 MG tablet Take 7.5 mg by mouth 2 (two) times daily as needed (anxiety). 02/22/21   [provider]  calcitRIOL (ROCALTROL) 0.25 MCG capsule Take 0.25 mcg by mouth daily. 02/12/21   [provider]  Cholecalciferol (VITAMIN D-3) 25 MCG (1000 UT) CAPS Take 2,000 Units by mouth daily.    [provider]  denosumab (PROLIA) 60 MG/ML SOSY injection Inject 60 mg [place] every 6 months 05/23/22     diltiazem (CARDIZEM CD) 120 MG 24 hr capsule Take 1 capsule (120 mg total) by mouth daily. 06/30/22 09/28/22  O'NealRonnald Ramp, MD  DULoxetine (CYMBALTA) 30 MG capsule Take 30 mg by mouth at bedtime. 06/21/20   [provider]  ferrous sulfate 324 MG TBEC Take 324 mg by mouth daily with breakfast.    [provider]  furosemide (LASIX) 40 MG tablet Take 40 mg by mouth daily.    [provider]  HYDROcodone-acetaminophen (NORCO/VICODIN) 5-325 MG tablet Take 1 tablet by mouth every 8 hours as needed for pain 06/02/21     HYDROcodone-acetaminophen (NORCO/VICODIN) 5-325 MG tablet Take 1 tablet by mouth every 8 (eight) hours as needed for pain 06/16/21     HYDROcodone-acetaminophen (NORCO/VICODIN) 5-325 MG tablet Take 1  tablet by mouth every 8 hours as needed for pain 06/28/21     methocarbamol (ROBAXIN) 500 MG tablet Take 500 mg by mouth 3 (three) times daily as needed for muscle spasms. 03/08/21   [provider]  Multiple Vitamin (MULTIVITAMIN ADULT PO) Take 1 tablet by mouth daily.    [provider]  oxyCODONE (OXY IR/ROXICODONE) 5 MG immediate release tablet Take 1 tablet by mouth every 6 hours as needed for severe pain 02/23/22     oxyCODONE (OXY IR/ROXICODONE) 5 MG immediate release tablet Take 1  tablet (5 mg total) by mouth every 6 (six) hours as needed for severe pain 06/27/22     pantoprazole (PROTONIX) 40 MG tablet Take 40 mg by mouth daily. 01/28/21   [provider]  potassium chloride (KLOR-CON) 20 MEQ packet Take 20 mEq by mouth daily.    [provider]  Probiotic Product (PROBIOTIC PO) Take 1 capsule by mouth daily.    [provider]  vitamin C (ASCORBIC ACID) 500 MG tablet Take 500 mg by mouth 2 (two) times a week.    [provider]      Allergies    Metoprolol and Shellfish-derived products    Review of Systems   Review of Systems  All other systems reviewed and are negative.   Physical Exam Updated Vital Signs BP (!) 145/82 (BP Location: Right Arm)   Pulse 88   Temp 98 F (36.7 C) (Oral)   Resp 18   Ht 5\' 1"  (1.549 m)   Wt 57.6 kg   SpO2 100%   BMI 24.00 kg/m  Physical Exam Vitals and nursing note reviewed.  Constitutional:      General: She is not in acute distress.    Appearance: Normal appearance. She is well-developed.  HENT:     Head: Normocephalic and atraumatic.  Eyes:     Conjunctiva/sclera: Conjunctivae normal.     Pupils: Pupils are equal, round, and reactive to light.  Cardiovascular:     Rate and Rhythm: Normal rate and regular rhythm.     Heart sounds: Normal heart sounds.  Pulmonary:     Effort: Pulmonary effort is normal. No respiratory distress.     Breath sounds: Normal breath sounds.  Abdominal:     General: There is no distension.     Palpations: Abdomen is soft.     Tenderness: There is no abdominal tenderness.     Comments: Urostomy at midline   Musculoskeletal:        General: No deformity. Normal range of motion.     Cervical back: Normal range of motion and neck supple.  Skin:    General: Skin is warm and dry.  Neurological:     General: No focal deficit present.     Mental Status: She is alert and oriented to person, place, and time.     ED Results / Procedures / Treatments    Labs (all labs ordered are listed, but only abnormal results are displayed) Labs Reviewed  URINALYSIS, ROUTINE W REFLEX MICROSCOPIC - Abnormal; Notable for the following components:      Result Value   APPearance CLOUDY (*)    Protein, ur 100 (*)    Leukocytes,Ua LARGE (*)    Bacteria, UA MANY (*)    All other components within normal limits  CULTURE, BLOOD (ROUTINE X 2)  CULTURE, BLOOD (ROUTINE X 2)  COMPREHENSIVE METABOLIC PANEL  CBC  SEDIMENTATION RATE  LACTIC ACID, PLASMA  LACTIC ACID, PLASMA  C-REACTIVE PROTEIN    EKG None  Radiology No results found.  Procedures Procedures    Medications Ordered in ED Medications - No data to display  ED Course/ Medical Decision Making/ A&P                             Medical Decision Making Amount and/or Complexity of Data Reviewed Labs: ordered. Radiology: ordered.  Risk Prescription drug management. Decision regarding hospitalization.    Medical Screen Complete  This patient presented to the ED with complaint of left flank pain.  This complaint involves an extensive number of treatment options. The initial differential diagnosis includes, but is not limited to, UTI, metabolic abnormality, recurrent OM, etc.   This presentation is: Acute, Chronic, Self-Limited, Previously Undiagnosed, Uncertain Prognosis, Complicated, Systemic Symptoms, and Threat to Life/Bodily Function  Patient with complex medical history presents with complaint of left-sided flank pain and cloudy urine.  Screening labs reveal evidence of hyponatremia with a sodium of 127, potassium is 3.1, creatinine today is 2.6 with a BUN of 33.  Patient's white count is 11.  CRP and ESR are pending.  UA is nitrate negative, large amount of leukocytes, greater than 50 whites, many bacteria.  CT abdomen pelvis without acute abnormality.  Patient with complex history and would benefit from overnight observation for treatment of hyponatremia and possible  recurrent UTI.  Hospitalist service made aware of case and will evaluate for admission.  Additional history obtained: External records from outside sources obtained and reviewed including prior ED visits and prior Inpatient records.    Lab Tests:  I ordered and personally interpreted labs.    Imaging Studies ordered:  I ordered imaging studies including CT AP  I independently visualized and interpreted obtained imaging which showed NAD I agree with the radiologist interpretation.   Cardiac Monitoring:  The patient was maintained on a cardiac monitor.  I personally viewed and interpreted the cardiac monitor which showed an underlying rhythm of: NSR   Medicines ordered:  I ordered medication including antibiotics  for suspected infection  Reevaluation of the patient after these medicines showed that the patient: stayed the same  Problem List / ED Course:  Flank pain, concern for UTI   Reevaluation:  After the interventions noted above, I reevaluated the patient and found that they have: stayed the same  Disposition:  After consideration of the diagnostic results and the patients response to treatment, I feel that the patent would benefit from admission.          Final Clinical Impression(s) / ED Diagnoses Final diagnoses:  Hyponatremia  AKI (acute kidney injury) (HCC)  Left flank pain    Rx / DC Orders ED Discharge Orders     None         Wynetta Fines, MD 07/24/22 2334    Wynetta Fines, MD 07/29/22 [phone]

## 2022-07-24 NOTE — ED Triage Notes (Signed)
Pt states that she has left flank pain x 1 day. Pt has a urostomy bag and reports that symptoms feel similar to previous kidney infections.

## 2022-07-24 NOTE — Progress Notes (Signed)
PHARMACY NOTE:  ANTIMICROBIAL RENAL DOSAGE ADJUSTMENT  Current antimicrobial regimen includes a mismatch between antimicrobial dosage and estimated renal function.  As per policy approved by the Pharmacy & Therapeutics and Medical Executive Committees, the antimicrobial dosage will be adjusted accordingly.  Current antimicrobial dosage:  Meropenem 1gm IV q8h  Indication: osteomyelitis with hx ESBL UTI  Renal Function:  Estimated Creatinine Clearance: 16.8 mL/min (A) (by C-G formula based on SCr of 2.61 mg/dL (H)). []      On intermittent HD, scheduled: []      On CRRT    Antimicrobial dosage has been changed to:  Meropenem 1gm IV q12h  Additional comments:   Thank you for allowing pharmacy to be a part of this patient's care.  Junita Push, [facility] 07/24/2022 11:38 PM

## 2022-07-24 NOTE — H&P (Incomplete)
History and Physical  Tanda Lightbourn NWG:[id number] DOB: [birth year] DOA: 07/24/2022  Referring physician: Dr. Rodena Medin, EDP  PCP: Ellyn Hack, MD  Outpatient Specialists: Cardiology, infectious disease. Patient coming from: Home.  Chief Complaint: Left flank pain.  HPI: Shelia Chan is a 67 y.o. 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  Associated with nausea and cloudy urine.  States her symptoms are similar to previous UTIs.  No reported subjective fevers or chills.  She presented to the ED for further evaluation.  In the ED, UA is positive for pyuria, due to concern for developing left pyelonephritis, blood cultures were obtained and the patient was started on empiric IV antibiotics Merrem.  Last urine culture on 03/14/2021 revealed ID/sensitivities ESBL E. coli UTI and Pseudomonas aeruginosa UTI.  The patient was admitted by [facility], hospitalist service, to telemetry unit as observation status for presumed complicated UTI.  ED Course: Temperature 97.9.  BP 115/74, pulse 64, respiratory 16, with O2 saturation 99% on room air.  Lab studies notable for UA positive for pyuria, WBC 11.0, hemoglobin 10.4, platelet count 431.  Serum sodium 129, potassium 3.2, glucose 105, BUN 33, creatinine 2.67, phosphorus 3.4, magnesium 1.7, GFR 19.  Review of Systems: Review of systems as noted in the HPI. All other systems reviewed and are negative.   Past Medical History:  Diagnosis Date   Arthritis    Asthma    Blood transfusion without reported diagnosis    Cancer (HCC)    CHF (congestive heart failure) (HCC)    COPD (chronic obstructive pulmonary disease) (HCC)    Heart murmur    Immune deficiency  disorder (HCC)    Renal disorder    Past Surgical History:  Procedure Laterality Date   BLADDER REMOVAL  118/22/2015   FRACTURE SURGERY Left 02/2018   pins   IR CATHETER TUBE CHANGE  04/15/2019   IR CATHETER TUBE CHANGE  04/15/2019   IR EXT NEPHROURETERAL CATH EXCHANGE  07/15/2019   IR EXT NEPHROURETERAL CATH EXCHANGE  10/14/2019   IR EXT NEPHROURETERAL CATH EXCHANGE  10/14/2019   IR EXT NEPHROURETERAL CATH EXCHANGE  01/06/2020   IR EXT NEPHROURETERAL CATH EXCHANGE  03/16/2020   IR EXT NEPHROURETERAL CATH EXCHANGE  05/25/2020   IR EXT NEPHROURETERAL CATH EXCHANGE  07/17/2020   IR EXT NEPHROURETERAL CATH EXCHANGE  07/17/2020   IR EXT NEPHROURETERAL CATH EXCHANGE  08/14/2020   IR EXT NEPHROURETERAL CATH EXCHANGE  09/25/2020   IR EXT NEPHROURETERAL CATH EXCHANGE  11/06/2020   IR EXT NEPHROURETERAL CATH EXCHANGE  12/25/2020   IR EXT NEPHROURETERAL CATH EXCHANGE  02/09/2021   IR EXT NEPHROURETERAL CATH EXCHANGE  03/19/2021   IR EXT NEPHROURETERAL CATH EXCHANGE  03/19/2021   IR EXT NEPHROURETERAL CATH EXCHANGE  04/23/2021   IR EXT NEPHROURETERAL CATH EXCHANGE  04/23/2021   IR EXT NEPHROURETERAL CATH EXCHANGE  06/04/2021   IR EXT NEPHROURETERAL CATH EXCHANGE  07/15/2021   IR EXT NEPHROURETERAL CATH EXCHANGE  09/02/2021   IR EXT NEPHROURETERAL CATH EXCHANGE  10/14/2021   IR EXT NEPHROURETERAL CATH EXCHANGE  11/25/2021   IR EXT NEPHROURETERAL CATH EXCHANGE  01/06/2022   IR EXT NEPHROURETERAL CATH EXCHANGE  02/17/2022   IR EXT NEPHROURETERAL CATH EXCHANGE  03/31/2022   IR EXT  NEPHROURETERAL CATH EXCHANGE  05/12/2022   IR EXT NEPHROURETERAL CATH EXCHANGE  06/30/2022   IR PERC TUN PERIT CATH WO PORT S&I /IMAG  03/25/2021   IR REMOVAL TUN CV CATH W/O FL  06/04/2021    Social History:  reports that she has been smoking cigarettes and e-cigarettes. She has a 50.00 pack-year smoking history. She has never used smokeless tobacco. She reports that she does not currently use alcohol. She reports current drug use. Drug:  Marijuana.   Allergies  Allergen Reactions   Metoprolol Shortness Of Breath   Shellfish-Derived Products Other (See Comments)    Family History  Problem Relation Age of Onset   Heart disease Maternal Grandfather       Prior to Admission medications   Medication Sig Start Date End Date Taking? Authorizing Provider  acetaminophen (TYLENOL) 500 MG tablet Take 500-1,000 mg by mouth every 6 (six) hours as needed for moderate pain or fever.    [provider]  albuterol (VENTOLIN HFA) 108 (90 Base) MCG/ACT inhaler Inhale 1 puff into the lungs every 6 (six) hours as needed for shortness of breath. 05/22/20   [provider]  ARIPiprazole (ABILIFY) 30 MG tablet Take 30 mg by mouth daily. 03/19/19   [provider]  aspirin EC 81 MG tablet Take 81 mg by mouth daily.    [provider]  atorvastatin (LIPITOR) 10 MG tablet Take 10 mg by mouth at bedtime. 02/07/21   [provider]  atorvastatin (LIPITOR) 40 MG tablet Take 1 tablet daily at bedtime. 03/17/22     B Complex-C (B-COMPLEX WITH VITAMIN C) tablet Take 1 tablet by mouth daily.    [provider]  busPIRone (BUSPAR) 7.5 MG tablet Take 7.5 mg by mouth 2 (two) times daily as needed (anxiety). 02/22/21   [provider]  calcitRIOL (ROCALTROL) 0.25 MCG capsule Take 0.25 mcg by mouth daily. 02/12/21   [provider]  Cholecalciferol (VITAMIN D-3) 25 MCG (1000 UT) CAPS Take 2,000 Units by mouth daily.    [provider]  denosumab (PROLIA) 60 MG/ML SOSY injection Inject 60 mg [place] every 6 months 05/23/22     diltiazem (CARDIZEM CD) 120 MG 24 hr capsule Take 1 capsule (120 mg total) by mouth daily. 06/30/22 09/28/22  O'NealRonnald Ramp, MD  DULoxetine (CYMBALTA) 30 MG capsule Take 30 mg by mouth at bedtime. 06/21/20   [provider]  ferrous sulfate 324 MG TBEC Take 324 mg by mouth daily with breakfast.    [provider]  furosemide (LASIX) 40 MG tablet Take  40 mg by mouth daily.    [provider]  HYDROcodone-acetaminophen (NORCO/VICODIN) 5-325 MG tablet Take 1 tablet by mouth every 8 hours as needed for pain 06/02/21     HYDROcodone-acetaminophen (NORCO/VICODIN) 5-325 MG tablet Take 1 tablet by mouth every 8 (eight) hours as needed for pain 06/16/21     HYDROcodone-acetaminophen (NORCO/VICODIN) 5-325 MG tablet Take 1 tablet by mouth every 8 hours as needed for pain 06/28/21     methocarbamol (ROBAXIN) 500 MG tablet Take 500 mg by mouth 3 (three) times daily as needed for muscle spasms. 03/08/21   [provider]  Multiple Vitamin (MULTIVITAMIN ADULT PO) Take 1 tablet by mouth daily.    [provider]  oxyCODONE (OXY IR/ROXICODONE) 5 MG immediate release tablet Take 1 tablet by mouth every 6 hours as needed for severe pain 02/23/22     oxyCODONE (OXY IR/ROXICODONE) 5 MG immediate release tablet  Take 1 tablet (5 mg total) by mouth every 6 (six) hours as needed for severe pain 06/27/22     pantoprazole (PROTONIX) 40 MG tablet Take 40 mg by mouth daily. 01/28/21   [provider]  potassium chloride (KLOR-CON) 20 MEQ packet Take 20 mEq by mouth daily.    [provider]  Probiotic Product (PROBIOTIC PO) Take 1 capsule by mouth daily.    [provider]  vitamin C (ASCORBIC ACID) 500 MG tablet Take 500 mg by mouth 2 (two) times a week.    [provider]    Physical Exam: BP (!) 145/82 (BP Location: Right Arm)   Pulse 88   Temp 98 F (36.7 C) (Oral)   Resp 18   Ht 5\' 1"  (1.549 m)   Wt 57.6 kg   SpO2 100%   BMI 24.00 kg/m   General: 67 y.o. year-old female well developed well nourished in no acute distress.  Alert and oriented x3. Cardiovascular: Regular rate and rhythm with no rubs or gallops.  No thyromegaly or JVD noted.  Lymphedema in lower extremities bilaterally. Respiratory: Clear to auscultation with no wheezes or rales. Good inspiratory effort. Abdomen: Soft with normal bowel  sounds.  Left flank tenderness.  Urostomy bag in place. Muskuloskeletal: No cyanosis or clubbing noted bilaterally Neuro: CN II-XII intact, strength, sensation, reflexes Skin: No ulcerative lesions noted or rashes Psychiatry: Judgement and insight appear normal. Mood is appropriate for condition and setting          Labs on Admission:  Basic Metabolic Panel: Recent Labs  Lab 07/24/22 2214  NA 127*  K 3.1*  CL 92*  CO2 22  GLUCOSE 99  BUN 33*  CREATININE 2.61*  CALCIUM 8.7*   Liver Function Tests: Recent Labs  Lab 07/24/22 2214  AST 16  ALT 15  ALKPHOS 113  BILITOT 0.5  PROT 7.2  ALBUMIN 3.4*   No results for input(s): "LIPASE", "AMYLASE" in the last 168 hours. No results for input(s): "AMMONIA" in the last 168 hours. CBC: Recent Labs  Lab 07/24/22 2214  WBC 11.0*  HGB 10.4*  HCT 31.9*  MCV 94.4  PLT 431*   Cardiac Enzymes: No results for input(s): "CKTOTAL", "CKMB", "CKMBINDEX", "TROPONINI" in the last 168 hours.  BNP (last 3 results) No results for input(s): "BNP" in the last 8760 hours.  ProBNP (last 3 results) No results for input(s): "PROBNP" in the last 8760 hours.  CBG: No results for input(s): "GLUCAP" in the last 168 hours.  Radiological Exams on Admission: CT ABDOMEN PELVIS WO CONTRAST  Result Date: 07/24/2022 CLINICAL DATA:  Abdominal pain, acute, nonlocalized. Left flank pain. EXAM: CT ABDOMEN AND PELVIS WITHOUT CONTRAST TECHNIQUE: Multidetector CT imaging of the abdomen and pelvis was performed following the standard protocol without IV contrast. RADIATION DOSE REDUCTION: This exam was performed according to the departmental dose-optimization program which includes automated exposure control, adjustment of the mA and/or kV according to patient size and/or use of iterative reconstruction technique. COMPARISON:  04/20/2021. FINDINGS: Lower chest: Mild atelectasis is noted bilaterally. Hepatobiliary: No focal liver abnormality is seen. No  gallstones, gallbladder wall thickening, or biliary dilatation. Pancreas: Unremarkable. No pancreatic ductal dilatation or surrounding inflammatory changes. Spleen: Normal in size without focal abnormality. Adrenals/Urinary Tract: The adrenal glands are within normal limits. Renal atrophy is noted on the left. There is a nonobstructive left renal calculus. Bilateral ureteral stents are noted and there is no hydroureteronephrosis. No ureteral calculus is seen. Status post cystectomy with  diverting urostomy in the right lower quadrant. Stomach/Bowel: Stomach is within normal limits. Appendix is not seen. Multiple anastomotic sites are noted in the pelvis. No evidence of bowel wall thickening, distention, or inflammatory changes. No free air or pneumatosis. Vascular/Lymphatic: Aortic atherosclerosis. No abdominal or pelvic lymphadenopathy by size criteria. Reproductive: Status post hysterectomy. No adnexal masses. Other: No abdominopelvic ascites. Musculoskeletal: Degenerative changes are present in the thoracolumbar spine. No acute or suspicious osseous abnormality. 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 There is fragmentation of the pubis symphysis on the right. There is a lucency in the pubis symphysis on the left with sclerosis which is new from the previous exam. Fixation hardware is noted in the proximal left femur. A mixed lytic and sclerotic lesion is noted in the iliac bone on the left. IMPRESSION: 1. Left renal calculus and renal atrophy. Bilateral ureteral stents in place with no evidence of hydroureteronephrosis. 2. Status post cystectomy with right lower quadrant diverting urostomy. 3. Bony fragmentation of the pubic bone on the left with mixed lytic and sclerotic regions in the pubis symphysis on the left and in the left iliac bone, possible infection versus metastatic disease. 4. Aortic atherosclerosis. Electronically Signed   By: Thornell Sartorius M.D.   On:  07/24/2022 22:52    EKG: I independently viewed the EKG done and my findings are as followed: None available at the time of this visit.  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 coli UTI and Pseudomonas aeruginosa UTI Follow urine culture and peripheral blood cultures x 2 for ID and sensitivities Currently her UTI is covered with Merrem, continue IV antibiotic until urine culture results Narrow down antibiotics when able.  Left renal calculus and renal atrophy with bilateral ureteral stents in place No evidence of hydroureteronephrosis IV fluid hydration Pain control as needed  AKI on CKD 3B, suspect prerenal in the setting of dehydration from poor oral intake The left renal calculus is nonobstructive, seen on CT scan Baseline creatinine appears to be 1.3 with GFR 43 Presented with creatinine of 2.67 with GFR of 19. Start IV fluid hydration Avoid nephrotoxic agents, dehydration and hypotension.  Moderate hypokalemia Serum potassium 3.1 Repleted intravenously with NS KCl 40 mill equivalent at 50 cc/h x 12 hours. Repeat BMP in the morning.  Hypovolemic hyponatremia Serum sodium 127, improved to 129 after IV fluid hydration Continue gentle IV fluid hydration and avoid quick correction of the serum sodium. Encourage oral intake with increase in oral protein calorie intake  Anemia of chronic disease in the setting of CKD Hemoglobin 10.4, improved to 11.0 No overt bleeding reported Continue to monitor H&H  Hyperlipidemia Resume home Lipitor  GERD Resume home PPI  Chronic HFpEF Euvolemic on exam overall, she has chronic lymphedema Closely monitor volume status while on gentle IV  fluid hydration Start strict I's and O's and daily weight    Time: 75 minutes.    DVT prophylaxis: Subcu Lovenox daily  Code Status: Full code.  Family Communication: None at bedside.  Disposition Plan: Admitted to telemetry unit  Consults called: None.    Admission status:  Observation status.   Status is: Observation    Darlin Drop MD [facility] Pager [phone]  If 7PM-7AM, please contact night-coverage [email or web address] Password [facility]  07/24/2022, 11:38 PM

## 2022-07-25 DIAGNOSIS — J4489 Other specified chronic obstructive pulmonary disease: Secondary | ICD-10-CM | POA: Diagnosis present

## 2022-07-25 DIAGNOSIS — I5032 Chronic diastolic (congestive) heart failure: Secondary | ICD-10-CM | POA: Diagnosis present

## 2022-07-25 DIAGNOSIS — E871 Hypo-osmolality and hyponatremia: Secondary | ICD-10-CM | POA: Diagnosis present

## 2022-07-25 DIAGNOSIS — E785 Hyperlipidemia, unspecified: Secondary | ICD-10-CM | POA: Diagnosis present

## 2022-07-25 DIAGNOSIS — Z8619 Personal history of other infectious and parasitic diseases: Secondary | ICD-10-CM | POA: Diagnosis not present

## 2022-07-25 DIAGNOSIS — N200 Calculus of kidney: Secondary | ICD-10-CM | POA: Diagnosis present

## 2022-07-25 DIAGNOSIS — F17210 Nicotine dependence, cigarettes, uncomplicated: Secondary | ICD-10-CM | POA: Diagnosis present

## 2022-07-25 DIAGNOSIS — E876 Hypokalemia: Secondary | ICD-10-CM | POA: Diagnosis present

## 2022-07-25 DIAGNOSIS — E860 Dehydration: Secondary | ICD-10-CM | POA: Diagnosis present

## 2022-07-25 DIAGNOSIS — Z8559 Personal history of malignant neoplasm of other urinary tract organ: Secondary | ICD-10-CM | POA: Diagnosis not present

## 2022-07-25 DIAGNOSIS — N390 Urinary tract infection, site not specified: Secondary | ICD-10-CM | POA: Diagnosis present

## 2022-07-25 DIAGNOSIS — D631 Anemia in chronic kidney disease: Secondary | ICD-10-CM | POA: Diagnosis present

## 2022-07-25 DIAGNOSIS — E861 Hypovolemia: Secondary | ICD-10-CM | POA: Diagnosis present

## 2022-07-25 DIAGNOSIS — K219 Gastro-esophageal reflux disease without esophagitis: Secondary | ICD-10-CM | POA: Diagnosis present

## 2022-07-25 DIAGNOSIS — Z87440 Personal history of urinary (tract) infections: Secondary | ICD-10-CM | POA: Diagnosis not present

## 2022-07-25 DIAGNOSIS — Z7982 Long term (current) use of aspirin: Secondary | ICD-10-CM | POA: Diagnosis not present

## 2022-07-25 DIAGNOSIS — F17290 Nicotine dependence, other tobacco product, uncomplicated: Secondary | ICD-10-CM | POA: Diagnosis present

## 2022-07-25 DIAGNOSIS — N179 Acute kidney failure, unspecified: Secondary | ICD-10-CM | POA: Diagnosis present

## 2022-07-25 DIAGNOSIS — E872 Acidosis, unspecified: Secondary | ICD-10-CM | POA: Diagnosis present

## 2022-07-25 DIAGNOSIS — N184 Chronic kidney disease, stage 4 (severe): Secondary | ICD-10-CM | POA: Diagnosis present

## 2022-07-25 DIAGNOSIS — Z8551 Personal history of malignant neoplasm of bladder: Secondary | ICD-10-CM | POA: Diagnosis not present

## 2022-07-25 DIAGNOSIS — I890 Lymphedema, not elsewhere classified: Secondary | ICD-10-CM | POA: Diagnosis present

## 2022-07-25 DIAGNOSIS — Z8249 Family history of ischemic heart disease and other diseases of the circulatory system: Secondary | ICD-10-CM | POA: Diagnosis not present

## 2022-07-25 DIAGNOSIS — A045 Campylobacter enteritis: Secondary | ICD-10-CM | POA: Diagnosis present

## 2022-07-25 LAB — CBC
HCT: 33.1 % — ABNORMAL LOW (ref 36.0–46.0)
Hemoglobin: 11 g/dL — ABNORMAL LOW (ref 12.0–15.0)
MCH: 31.3 pg (ref 26.0–34.0)
MCHC: 33.2 g/dL (ref 30.0–36.0)
MCV: 94 fL (ref 80.0–100.0)
Platelets: 446 K/uL — ABNORMAL HIGH (ref 150–400)
RBC: 3.52 MIL/uL — ABNORMAL LOW (ref 3.87–5.11)
RDW: 13 % (ref 11.5–15.5)
WBC: 10.9 K/uL — ABNORMAL HIGH (ref 4.0–10.5)
nRBC: 0 % (ref 0.0–0.2)

## 2022-07-25 LAB — BASIC METABOLIC PANEL
Anion gap: 10 (ref 5–15)
BUN: 33 mg/dL — ABNORMAL HIGH (ref 8–23)
CO2: 24 mmol/L (ref 22–32)
Calcium: 8.8 mg/dL — ABNORMAL LOW (ref 8.9–10.3)
Chloride: 95 mmol/L — ABNORMAL LOW (ref 98–111)
Creatinine, Ser: 2.67 mg/dL — ABNORMAL HIGH (ref 0.44–1.00)
GFR, Estimated: 19 mL/min — ABNORMAL LOW (ref >60)
Glucose, Bld: 105 mg/dL — ABNORMAL HIGH (ref 70–99)
Potassium: 3.2 mmol/L — ABNORMAL LOW (ref 3.5–5.1)
Sodium: 129 mmol/L — ABNORMAL LOW (ref 135–145)

## 2022-07-25 LAB — LACTIC ACID, PLASMA: Lactic Acid, Venous: 0.9 mmol/L (ref 0.5–1.9)

## 2022-07-25 LAB — C DIFFICILE QUICK SCREEN W PCR REFLEX
C Diff antigen: NEGATIVE
C Diff interpretation: NOT DETECTED
C Diff toxin: NEGATIVE

## 2022-07-25 LAB — PHOSPHORUS: Phosphorus: 3.4 mg/dL (ref 2.5–4.6)

## 2022-07-25 LAB — HIV ANTIBODY (ROUTINE TESTING W REFLEX): HIV Screen 4th Generation wRfx: NONREACTIVE

## 2022-07-25 LAB — C-REACTIVE PROTEIN: CRP: 10.5 mg/dL — ABNORMAL HIGH (ref <1.0)

## 2022-07-25 LAB — MAGNESIUM: Magnesium: 1.7 mg/dL (ref 1.7–2.4)

## 2022-07-25 MED ORDER — ALBUTEROL SULFATE (2.5 MG/3ML) 0.083% IN NEBU: 2.5000 mg | INHALATION_SOLUTION | Freq: Four times a day (QID) | RESPIRATORY_TRACT | Status: DC | PRN

## 2022-07-25 MED ORDER — FERROUS SULFATE 325 (65 FE) MG PO TABS
325.0000 mg | ORAL_TABLET | Freq: Every day | ORAL | Status: DC
  Administered 2022-07-26 – 2022-07-28 (×3): 325 mg via ORAL
  Filled 2022-07-25 (×3): qty 1

## 2022-07-25 MED ORDER — CALCITRIOL 0.25 MCG PO CAPS
0.2500 ug | ORAL_CAPSULE | Freq: Every day | ORAL | Status: DC
  Administered 2022-07-25 – 2022-07-28 (×4): 0.25 ug via ORAL
  Filled 2022-07-25 (×4): qty 1

## 2022-07-25 MED ORDER — DULOXETINE HCL 30 MG PO CPEP
30.0000 mg | ORAL_CAPSULE | Freq: Every day | ORAL | Status: DC
  Administered 2022-07-25 – 2022-07-27 (×3): 30 mg via ORAL
  Filled 2022-07-25 (×3): qty 1

## 2022-07-25 MED ORDER — VITAMIN D 25 MCG (1000 UNIT) PO TABS
2000.0000 [IU] | ORAL_TABLET | Freq: Every day | ORAL | Status: DC
  Administered 2022-07-26 – 2022-07-28 (×3): 2000 [IU] via ORAL
  Filled 2022-07-25 (×3): qty 2

## 2022-07-25 MED ORDER — POTASSIUM CHLORIDE CRYS ER 20 MEQ PO TBCR
40.0000 meq | EXTENDED_RELEASE_TABLET | Freq: Once | ORAL | Status: AC
  Administered 2022-07-25: 40 meq via ORAL
  Filled 2022-07-25: qty 2

## 2022-07-25 MED ORDER — POTASSIUM CHLORIDE IN NACL 40-0.9 MEQ/L-% IV SOLN
INTRAVENOUS | Status: AC
  Filled 2022-07-25: qty 1000

## 2022-07-25 MED ORDER — B COMPLEX-C PO TABS
1.0000 | ORAL_TABLET | Freq: Every day | ORAL | Status: DC
  Administered 2022-07-26 – 2022-07-28 (×3): 1 via ORAL
  Filled 2022-07-25 (×3): qty 1

## 2022-07-25 MED ORDER — VITAMIN C 500 MG PO TABS
500.0000 mg | ORAL_TABLET | Freq: Every day | ORAL | Status: DC
  Administered 2022-07-26 – 2022-07-28 (×3): 500 mg via ORAL
  Filled 2022-07-25 (×3): qty 1

## 2022-07-25 MED ORDER — BUSPIRONE HCL 5 MG PO TABS: 7.5000 mg | ORAL_TABLET | Freq: Two times a day (BID) | ORAL | Status: DC | PRN

## 2022-07-25 MED ORDER — ARIPIPRAZOLE 5 MG PO TABS
30.0000 mg | ORAL_TABLET | Freq: Every day | ORAL | Status: DC
  Administered 2022-07-25 – 2022-07-28 (×4): 30 mg via ORAL
  Filled 2022-07-25 (×4): qty 6

## 2022-07-25 MED ORDER — HYDROCODONE-ACETAMINOPHEN 5-325 MG PO TABS: 1.0000 | ORAL_TABLET | Freq: Four times a day (QID) | ORAL | Status: DC | PRN

## 2022-07-25 NOTE — Progress Notes (Signed)
PROGRESS NOTE Shelia Chan  BMW:[id number] DOB: 11/14/1955 DOA: 07/24/2022 PCP: Ellyn Hack, MD  Brief Narrative/Hospital Course: 67 y.o. female with medical history significant for COPD, CKD stage IV, bladder cancer status post cystectomy and ileal conduit, atrial tachycardia, chronic HFpEF, history of lymphedema, history of bilateral pubic bone osteomyelitis completed 6-8-weeks course of Merrem, presented with complaints of left-sided flank pain with onset in the past 24 hours w/ nausea and cloudy urine. In the ED, UA is positive for pyuria, due to concern for developing left pyelonephritis, blood cultures were obtained and the patient was started on empiric IV antibiotics Merrem.  Last urine culture on 03/14/2021 revealed ID/sensitivities ESBL E. coli UTI and Pseudomonas aeruginosa UTI. In the ED vitals stable afebrile labs showed hypokalemia hyponatremia chronic anemia mild leukocytosis and creatinine 2.6 BUN 3     Subjective: Patient seen and examined this morning overall feels much improved clinically It is clearing her ileostomy bag Patient endorses multiple episodes of diarrhea since admission.  X 14 since coming to the room.  Assessment and Plan: Principal Problem:   Complicated UTI (urinary tract infection)  Complicated UTI in the setting of previous cystectomy with urostomy bag in place: History of ESBL E. coli UTI and Pseudomonas aeruginosa UTI, continue current empiric antibiotics with meropenem, follow-up blood culture-resend urine culture.Currently afebrile and stable WBC count  Left renal calculus and renal atrophy with bilateral ureteral stent in place: No hydroureteronephrosis, continue IV fluid hydration  AKI on CKD 3B likely prerenal in the setting of dehydration and poor oral intake: Baseline creatinine around 1.3 with EGFR 43, creatinine remains elevated, continue IV hydration, avoid nephrotoxic agents dehydration and hypotension Recent Labs    08/05/21 0927  01/27/22 1006 07/24/22 2214 07/25/22 0108  BUN 27* 20 33* 33*  CREATININE 1.68* 2.05* 2.61* 2.67*  CO2 19* 20 22 24     Hypokalemia replete Recent Labs  Lab 07/24/22 2214 07/25/22 0108  K 3.1* 3.2*    Hypovolemic hyponatremia continue IV fluids Recent Labs  Lab 07/24/22 2214 07/25/22 0108  NA 127* 129*   Diarrhea multiple episodes: Check to see C. 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 Patient status is:  admitted as observation but remains hospitalized for ongoing  because of ongoing need for IV antibiotics Level of care: Telemetry   Dispo: The patient is from: home            Anticipated disposition: home tomorrow if stable Objective: Vitals last 24 hrs: Vitals:   07/24/22 2058 07/25/22 0100 07/25/22 0500 07/25/22 0856  BP: (!) 145/82 115/74 122/84 (!) 112/57  Pulse: 88 63 65 68  Resp: 18 16 16 17   Temp: 98 F (36.7 C) 97.9 F (36.6 C) 98 F (36.7 C) 98.2 F (36.8 C)  TempSrc: Oral Oral Oral Oral  SpO2: 100% 99% 98% 97%  Weight: 57.6 kg     Height: 5\' 1"  (1.549 m)      Weight change:   Physical Examination: General exam: alert awake, oriented at baseline, older than stated age HEENT:Oral mucosa moist, Ear/Nose WNL grossly Respiratory system: Bilaterally clear BS,no use of accessory muscle Cardiovascular system: S1 & S2 +, No JVD. Gastrointestinal system: Abdomen soft,NT,ND, BS+ Nervous  System: Alert, awake, moving all extremities,and following commands. Extremities: LE edema neg,distal peripheral pulses palpable and warm.  Skin: No rashes,no icterus. MSK: Normal muscle bulk,tone, power   Medications reviewed:  Scheduled  Meds:  aspirin EC  81 mg Oral Daily   atorvastatin  40 mg Oral QHS   enoxaparin (LOVENOX) injection  30 mg Subcutaneous Q24H   pantoprazole  40 mg Oral Daily   Continuous Infusions:  0.9 % NaCl with KCl 40 mEq / L 75 mL/hr at 07/25/22 0645   meropenem (MERREM) IV 1 g (07/25/22 1214)    Diet Order             Diet Heart Fluid consistency: Thin  Diet effective now                  Intake/Output Summary (Last 24 hours) at 07/25/2022 1230 Last data filed at 07/25/2022 0857 Gross per 24 hour  Intake 746.37 ml  Output 400 ml  Net 346.37 ml   Net IO Since Admission: 346.37 mL [07/25/22 1230]  Wt Readings from Last 3 Encounters:  07/24/22 57.6 kg  06/30/22 59 kg  01/27/22 60.4 kg     Unresulted Labs (From admission, onward)     Start     Ordered   07/31/22 0500  Creatinine, serum  (enoxaparin (LOVENOX)    CrCl < 30 ml/min)  Once,   R       Comments: while on enoxaparin therapy.    07/24/22 2335   07/26/22 0500  Basic metabolic panel  Tomorrow morning,   R        07/25/22 0421   07/25/22 1029  Gastrointestinal Panel by PCR , Stool  (Gastrointestinal Panel by PCR, Stool                                                                                                                                                     **Does Not include CLOSTRIDIUM DIFFICILE testing. **If CDIFF testing is needed, place order from the "C Difficile Testing" order set.**)  Once,   R        07/25/22 1028   07/25/22 1028  C Difficile Quick Screen w PCR reflex  (C Difficile quick screen w PCR reflex panel )  Once, for 24 hours,   TIMED       References:    CDiff Information Tool   07/25/22 1028   07/25/22 0845  Urine Culture (for pregnant, neutropenic or urologic patients or patients with an indwelling urinary catheter)  (Urine Labs)  Once,   R       Question:  Indication  Answer:  Dysuria   07/25/22 0844          Data Reviewed: I have personally reviewed following labs and imaging  studies CBC: Recent Labs  Lab 07/24/22 2214 07/25/22 0108  WBC 11.0* 10.9*  HGB 10.4* 11.0*  HCT 31.9* 33.1*  MCV 94.4 94.0  PLT 431* 446*   Basic Metabolic Panel: Recent Labs  Lab 07/24/22 2214 07/25/22 0108  NA 127* 129*  K 3.1* 3.2*  CL 92* 95*  CO2 22 24  GLUCOSE 99 105*  BUN 33* 33*  CREATININE 2.61* 2.67*  CALCIUM 8.7* 8.8*  MG  --  1.7  PHOS  --  3.4   GFR: Estimated Creatinine Clearance: 16.5 mL/min (A) (by C-G formula based on SCr of 2.67 mg/dL (H)). Liver Function Tests: Recent Labs  Lab 07/24/22 2214  AST 16  ALT 15  ALKPHOS 113  BILITOT 0.5  PROT 7.2  ALBUMIN 3.4*   Recent Labs  Lab 07/24/22 2214 07/25/22 0108  LATICACIDVEN 1.5 0.9    Recent Results (from the past 240 hour(s))  Culture, blood (routine x 2)     Status: None (Preliminary result)   Collection Time: 07/24/22 10:14 PM   Specimen: BLOOD RIGHT HAND  Result Value Ref Range Status   Specimen Description   Final    BLOOD RIGHT HAND Performed at [facility], [address] [place], [place] [address]    Special Requests   Final    BOTTLES DRAWN AEROBIC AND ANAEROBIC Blood Culture results may not be optimal due to an inadequate volume of blood received in culture bottles Performed at [facility], [address], [place], [place] [address]    Culture PENDING  Incomplete   Report Status PENDING  Incomplete  Culture, blood (routine x 2)     Status: None (Preliminary result)   Collection Time: 07/25/22  1:08 AM   Specimen: BLOOD RIGHT ARM  Result Value Ref Range Status   Specimen Description   Final    BLOOD RIGHT ARM Performed at [facility], [address], [place], [place] [address]    Special Requests   Final    Blood Culture adequate volume BOTTLES DRAWN AEROBIC AND ANAEROBIC Performed at [facility], [address] [place], [place] [address]    Culture PENDING  Incomplete   Report Status PENDING  Incomplete     Antimicrobials: Anti-infectives (From admission, onward)    Start     Dose/Rate Route Frequency Ordered Stop   07/25/22 0000  meropenem (MERREM) 1 g in sodium chloride 0.9 % 100 mL IVPB        1 g 200 mL/hr over 30 Minutes Intravenous Every 12 hours 07/24/22 2333        Culture/Microbiology    Component Value Date/Time   SDES  07/25/2022 0108    BLOOD RIGHT ARM Performed at [facility], [address], [place], [place] [address]    [facility]  07/25/2022 4010    Blood Culture adequate volume BOTTLES DRAWN AEROBIC AND ANAEROBIC Performed at [facility], [address] [place], [place] [address]    CULT PENDING 07/25/2022 0108   REPTSTATUS PENDING 07/25/2022 0108    Radiology Studies: CT ABDOMEN PELVIS WO CONTRAST  Result Date: 07/24/2022 CLINICAL DATA:  Abdominal pain, acute, nonlocalized. Left flank pain. EXAM: CT ABDOMEN AND PELVIS WITHOUT CONTRAST TECHNIQUE: Multidetector CT imaging of the abdomen and pelvis was performed following the standard protocol without IV contrast. RADIATION DOSE REDUCTION: This exam was performed according to the departmental dose-optimization program which includes automated exposure control, adjustment of the mA and/or kV according to patient size and/or use of iterative reconstruction technique. COMPARISON:  04/20/2021. FINDINGS: Lower chest: Mild atelectasis is noted bilaterally. Hepatobiliary: No focal liver abnormality is seen. No gallstones, gallbladder wall thickening, or biliary dilatation. Pancreas: Unremarkable. No pancreatic ductal dilatation or surrounding inflammatory changes. Spleen: Normal in size without focal abnormality. Adrenals/Urinary Tract: The adrenal glands are within normal limits. Renal atrophy is noted on the left. There is a nonobstructive left renal calculus. Bilateral ureteral stents are noted and there is no hydroureteronephrosis. No ureteral calculus is seen. Status post cystectomy with diverting  urostomy in the right lower quadrant. Stomach/Bowel: Stomach is within normal limits. Appendix is not seen. Multiple anastomotic sites are noted in the pelvis. No evidence of bowel wall thickening, distention, or inflammatory changes. No free air or pneumatosis. Vascular/Lymphatic: Aortic atherosclerosis. No abdominal or pelvic lymphadenopathy by size criteria. Reproductive: Status post hysterectomy. No adnexal masses. Other: No abdominopelvic ascites. Musculoskeletal: Degenerative changes are present in the thoracolumbar spine. No acute or suspicious osseous abnormality. 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 There is fragmentation of the pubis symphysis on the right. There is a lucency in the pubis symphysis on the left with sclerosis which is new from the previous exam. Fixation hardware is noted in the proximal left femur. A mixed lytic and sclerotic lesion is noted in the iliac bone on the left. IMPRESSION: 1. Left renal calculus and renal atrophy. Bilateral ureteral stents in place with no evidence of hydroureteronephrosis. 2. Status post cystectomy with right lower quadrant diverting urostomy. 3. Bony fragmentation of the pubic bone on the left with mixed lytic and sclerotic regions in the pubis symphysis on the left and in the left iliac bone, possible infection versus metastatic disease. 4. Aortic atherosclerosis. Electronically Signed   By: Thornell Sartorius M.D.   On: 07/24/2022 22:52     LOS: 0 days   Lanae Boast, MD [facility]  07/25/2022, 12:30 PM

## 2022-07-25 NOTE — TOC CM/SW Note (Signed)
Transition of Care [facility]) - Inpatient Brief Assessment   Patient Details  Name: Shelia Chan MRN: [id number] Date of Birth: Jan 06, 1956  Transition of Care [facility] CM/SW Contact:    Otelia Santee, LCSW Phone Number: 07/25/2022, 1:49 PM   Clinical Narrative: Chart reviewed. No TOC needs identified.    Transition of Care Asessment: Insurance and Status: Insurance coverage has been reviewed Patient has primary care physician: Yes Home environment has been reviewed: Home alone Prior level of function:: Independent Prior/Current Home Services: No current home services Social Determinants of Health Reivew: SDOH reviewed no interventions necessary Readmission risk has been reviewed: Yes Transition of care needs: no transition of care needs at this time

## 2022-07-25 NOTE — Hospital Course (Addendum)
67 y.o. female with medical history significant for COPD, CKD stage IV, bladder cancer status post cystectomy and ileal conduit, atrial tachycardia, chronic HFpEF, history of lymphedema, history of bilateral pubic bone osteomyelitis completed 6-8-weeks course of Merrem, presented with complaints of left-sided flank pain with onset in the past 24 hours w/ nausea and cloudy urine. In the ED, UA is positive for pyuria, due to concern for developing left pyelonephritis, blood cultures were obtained and the patient was started on empiric IV antibiotics Merrem.  Last urine culture on 03/14/2021 revealed ID/sensitivities ESBL E. coli UTI and Pseudomonas aeruginosa UTI. In the ED vitals stable afebrile labs showed hypokalemia hyponatremia chronic anemia mild leukocytosis and creatinine 2.6 BUN 3

## 2022-07-26 DIAGNOSIS — N390 Urinary tract infection, site not specified: Secondary | ICD-10-CM | POA: Diagnosis not present

## 2022-07-26 LAB — URINE CULTURE

## 2022-07-26 LAB — GASTROINTESTINAL PANEL BY PCR, STOOL (REPLACES STOOL CULTURE)

## 2022-07-26 LAB — CBC
HCT: 31 % — ABNORMAL LOW (ref 36.0–46.0)
Hemoglobin: 9.8 g/dL — ABNORMAL LOW (ref 12.0–15.0)
MCH: 30.2 pg (ref 26.0–34.0)
MCHC: 31.6 g/dL (ref 30.0–36.0)
MCV: 95.7 fL (ref 80.0–100.0)
Platelets: 387 10*3/uL (ref 150–400)
RBC: 3.24 MIL/uL — ABNORMAL LOW (ref 3.87–5.11)
RDW: 13.2 % (ref 11.5–15.5)
WBC: 6.5 10*3/uL (ref 4.0–10.5)
nRBC: 0 % (ref 0.0–0.2)

## 2022-07-26 LAB — BASIC METABOLIC PANEL
Anion gap: 10 (ref 5–15)
BUN: 26 mg/dL — ABNORMAL HIGH (ref 8–23)
CO2: 21 mmol/L — ABNORMAL LOW (ref 22–32)
Calcium: 8.8 mg/dL — ABNORMAL LOW (ref 8.9–10.3)
Chloride: 105 mmol/L (ref 98–111)
Creatinine, Ser: 2.4 mg/dL — ABNORMAL HIGH (ref 0.44–1.00)
GFR, Estimated: 21 mL/min — ABNORMAL LOW (ref >60)
Glucose, Bld: 106 mg/dL — ABNORMAL HIGH (ref 70–99)
Potassium: 3.6 mmol/L (ref 3.5–5.1)
Sodium: 136 mmol/L (ref 135–145)

## 2022-07-26 MED ORDER — SODIUM CHLORIDE 0.9 % IV SOLN: INTRAVENOUS | Status: DC

## 2022-07-26 NOTE — Progress Notes (Signed)
PROGRESS NOTE Shelia Chan  ZOX:[id number] DOB: [birth year] DOA: 07/24/2022 PCP: Ellyn Hack, MD  Brief Narrative/Hospital Course: 67 y.o. female with medical history significant for COPD, CKD stage IV, bladder cancer status post cystectomy and ileal conduit, atrial tachycardia, chronic HFpEF, history of lymphedema, history of bilateral pubic bone osteomyelitis completed 6-8-weeks course of Merrem, presented with complaints of left-sided flank pain with onset in the past 24 hours w/ nausea and cloudy urine. In the ED, UA is positive for pyuria, due to concern for developing left pyelonephritis, blood cultures were obtained and the patient was started on empiric IV antibiotics Merrem.  Last urine culture on 03/14/2021 revealed ID/sensitivities ESBL E. coli UTI and Pseudomonas aeruginosa UTI. In the ED vitals stable afebrile labs showed hypokalemia hyponatremia chronic anemia mild leukocytosis and creatinine 2.6 BUN 3     Subjective: Seen and examined this morning she reports no more flank pain urine clearing ileostomy, No nausea vomiting creatinine slightly better on IV fluids Reports her diarrhea is slowing down and less frequent   Assessment and Plan: Principal Problem:   Complicated UTI (urinary tract infection)  Complicated UTI in the setting of previous cystectomy with urostomy bag in place: History of ESBL E. coli UTI and Pseudomonas aeruginosa UTI, continue current empiric antibiotics with meropenem, urine culture in process, blood culture no growth to date.  wbc counts and vital stable  Left renal calculus and renal atrophy with bilateral ureteral stent in place: No hydroureteronephrosis, continue IV fluid hydration  AKI on CKD 3B: likely prerenal in the setting of dehydration and poor oral intake: Baseline creatinine around 1.6 - 2.0 looking at labs from last year and January of this year  Creatinine downtrending somewhat keep on IV fluid hydration next 24 hours CT further  improvement  creatinine remains elevated, continue IV hydration, avoid nephrotoxic agents dehydration and hypotension Recent Labs    08/05/21 0927 01/27/22 1006 07/24/22 2214 07/25/22 0108 07/26/22 0548  BUN 27* 20 33* 33* 26*  CREATININE 1.68* 2.05* 2.61* 2.67* 2.40*  CO2 19* 20 22 24  21*    Hypokalemia resolved Recent Labs  Lab 07/24/22 2214 07/25/22 0108 07/26/22 0548  K 3.1* 3.2* 3.6    Hypovolemic hyponatremia -resolved on ivf Recent Labs  Lab 07/24/22 2214 07/25/22 0108 07/26/22 0548  NA 127* 129* 136   Diarrhea multiple episodes: Negative for C. difficile GI panel pending, improving.  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 Net IO Since Admission: -588.63 mL [07/26/22 1147]   DVT prophylaxis: enoxaparin (LOVENOX) injection 30 mg Start: 07/25/22 1000 Code Status:   Code Status: Full Code Family Communication: plan of care discussed with patient at bedside. Patient status is:  admitted as observation but remains hospitalized for ongoing  because of ongoing need for IV antibiotics Level of care: Telemetry   Dispo: The patient is from: home            Anticipated disposition: home tomorrow if stable Objective: Vitals last 24 hrs: Vitals:   07/25/22 1306 07/25/22 1657 07/25/22 2304 07/26/22 0541  BP: (!) 119/57 116/66 (!) 100/59 116/63  Pulse: 70 81 74 73  Resp: 17 17 18 18   Temp: 98.1 F (36.7 C) 98.6 F (  37 C) 98 F (36.7 C) 98.2 F (36.8 C)  TempSrc: Oral Oral Oral Oral  SpO2: 99% 100% 96% 97%  Weight:      Height:       Weight change:   Physical Examination: General exam: alert awake, oriented at baseline,frail, older than stated age HEENT:Oral mucosa  moist, Ear/Nose WNL grossly Respiratory system: Bilaterally clear BS,no use of accessory muscle Cardiovascular system: S1 & S2 +, No JVD. Gastrointestinal system: Abdomen soft, urostomy bag in place with clear urine, nontender  abdomen. Nervous System: Alert, awake, moving all extremities,and following commands. Extremities: LE edema neg,distal peripheral pulses palpable and warm.  Skin: No rashes,no icterus. MSK: Normal muscle bulk,tone, power    Medications reviewed:  Scheduled Meds:  ARIPiprazole  30 mg Oral Daily   ascorbic acid  500 mg Oral Daily   aspirin EC  81 mg Oral Daily   atorvastatin  40 mg Oral QHS   B-complex with vitamin C  1 tablet Oral Daily   calcitRIOL  0.25 mcg Oral Daily   cholecalciferol  2,000 Units Oral Daily   DULoxetine  30 mg Oral QHS   enoxaparin (LOVENOX) injection  30 mg Subcutaneous Q24H   ferrous sulfate  325 mg Oral Q breakfast   pantoprazole  40 mg Oral Daily   Continuous Infusions:  sodium chloride 50 mL/hr at 07/26/22 0851   meropenem (MERREM) IV 1 g (07/25/22 2356)    Diet Order             Diet Heart Fluid consistency: Thin  Diet effective now                  Intake/Output Summary (Last 24 hours) at 07/26/2022 1147 Last data filed at 07/26/2022 0945 Gross per 24 hour  Intake 1140 ml  Output 2075 ml  Net -935 ml   Net IO Since Admission: -588.63 mL [07/26/22 1147]  Wt Readings from Last 3 Encounters:  07/24/22 57.6 kg  06/30/22 59 kg  01/27/22 60.4 kg     Unresulted Labs (From admission, onward)     Start     Ordered   07/31/22 0500  Creatinine, serum  (enoxaparin (LOVENOX)    CrCl < 30 ml/min)  Once,   R       Comments: while on enoxaparin therapy.    07/24/22 2335   07/25/22 1149  Miscellaneous test (send-out)  Once,   R        07/25/22 1149   07/25/22 1029  Gastrointestinal Panel by PCR , Stool  (Gastrointestinal Panel by PCR, Stool                                                                                                                                                      **Does Not include CLOSTRIDIUM DIFFICILE testing. **If CDIFF testing  is needed, place order from the "C Difficile Testing" order set.**)  Once,   R        07/25/22 1028          Data Reviewed: I have personally reviewed following labs and imaging studies CBC: Recent Labs  Lab 07/24/22 2214 07/25/22 0108 07/26/22 0548  WBC 11.0* 10.9* 6.5  HGB 10.4* 11.0* 9.8*  HCT 31.9* 33.1* 31.0*  MCV 94.4 94.0 95.7  PLT 431* 446* 387   Basic Metabolic Panel: Recent Labs  Lab 07/24/22 2214 07/25/22 0108 07/26/22 0548  NA 127* 129* 136  K 3.1* 3.2* 3.6  CL 92* 95* 105  CO2 22 24 21*  GLUCOSE 99 105* 106*  BUN 33* 33* 26*  CREATININE 2.61* 2.67* 2.40*  CALCIUM 8.7* 8.8* 8.8*  MG  --  1.7  --   PHOS  --  3.4  --    GFR: Estimated Creatinine Clearance: 18.3 mL/min (A) (by C-G formula based on SCr of 2.4 mg/dL (H)). Liver Function Tests: Recent Labs  Lab 07/24/22 2214  AST 16  ALT 15  ALKPHOS 113  BILITOT 0.5  PROT 7.2  ALBUMIN 3.4*   Recent Labs  Lab 07/24/22 2214 07/25/22 0108  LATICACIDVEN 1.5 0.9    Recent Results (from the past 240 hour(s))  Culture, blood (routine x 2)     Status: None (Preliminary result)   Collection Time: 07/24/22 10:14 PM   Specimen: BLOOD RIGHT HAND  Result Value Ref Range Status   Specimen Description   Final    BLOOD RIGHT HAND Performed at [facility], [address] [place], [place] [address]    Special Requests   Final    BOTTLES DRAWN AEROBIC AND ANAEROBIC Blood Culture results may not be optimal due to an inadequate volume of blood received in culture bottles Performed at [facility], [address], [place], [place] [address]    Culture   Final    NO GROWTH 1 DAY Performed at [facility], [address] [place], [place] [address]    Report Status PENDING  Incomplete  Culture, blood (routine x 2)     Status: None (Preliminary result)    Collection Time: 07/25/22  1:08 AM   Specimen: BLOOD RIGHT ARM  Result Value Ref Range Status   Specimen Description   Final    BLOOD RIGHT ARM Performed at [facility], [address], [place], [place] [address]    Special Requests   Final    Blood Culture adequate volume BOTTLES DRAWN AEROBIC AND ANAEROBIC Performed at [facility], [address] [place], [place] [address]    Culture   Final    NO GROWTH 1 DAY Performed at [facility], [address] [place], [place] [address]    Report Status PENDING  Incomplete  Urine Culture (for pregnant, neutropenic or urologic patients or patients with an indwelling urinary catheter)     Status: None (Preliminary result)   Collection Time: 07/25/22  9:11 AM   Specimen: Urine, Suprapubic  Result Value Ref Range Status   Specimen Description   Final    URINE, SUPRAPUBIC Performed at [facility], [address] [place], [place] [address]    Special Requests   Final    NONE Performed at [facility], [address] [place], [place] [address]    Culture   Final    CULTURE REINCUBATED FOR  BETTER GROWTH Performed at [facility], [address], [place], [place] [address]    Report Status PENDING  Incomplete  C Difficile Quick Screen w PCR reflex     Status: None   Collection Time: 07/25/22 11:49 AM   Specimen: STOOL  Result Value Ref Range Status   C Diff antigen NEGATIVE NEGATIVE Final   C Diff toxin NEGATIVE NEGATIVE Final   C Diff interpretation No C. difficile detected.  Final    Comment: Performed at [facility], [address], [place], [place] [address]    Antimicrobials: Anti-infectives (From admission, onward)    Start     Dose/Rate Route Frequency Ordered Stop   07/25/22 0000  meropenem (MERREM) 1 g in sodium chloride 0.9 % 100 mL IVPB        1 g 200 mL/hr over 30 Minutes Intravenous Every 12 hours 07/24/22 2333         Culture/Microbiology    Component Value Date/Time   SDES  07/25/2022 0911    URINE, SUPRAPUBIC Performed at [facility], [address] [place], [place] [address]    SPECREQUEST  07/25/2022 9147    NONE Performed at [facility], [address], [place], [place] [address]    CULT  07/25/2022 0911    CULTURE REINCUBATED FOR BETTER GROWTH Performed at [facility], [address], [place], [place] [address]    REPTSTATUS PENDING 07/25/2022 6578    Radiology Studies: CT ABDOMEN PELVIS WO CONTRAST  Result Date: 07/24/2022 CLINICAL DATA:  Abdominal pain, acute, nonlocalized. Left flank pain. EXAM: CT ABDOMEN AND PELVIS WITHOUT CONTRAST TECHNIQUE: Multidetector CT imaging of the abdomen and pelvis was performed following the standard protocol without IV contrast. RADIATION DOSE REDUCTION: This exam was performed according to the departmental dose-optimization program which includes automated exposure control, adjustment of the mA and/or kV according to patient size and/or use of iterative reconstruction technique. COMPARISON:  04/20/2021. FINDINGS: Lower chest: Mild atelectasis is noted bilaterally. Hepatobiliary: No focal liver abnormality is seen. No gallstones, gallbladder wall thickening, or biliary dilatation. Pancreas: Unremarkable. No pancreatic ductal dilatation or surrounding inflammatory changes. Spleen: Normal in size without focal abnormality. Adrenals/Urinary Tract: The adrenal glands are within normal limits. Renal atrophy is noted on the left. There is a nonobstructive left renal calculus. Bilateral ureteral stents are noted and there is no hydroureteronephrosis. No ureteral calculus is seen. Status post cystectomy with diverting urostomy in the right lower quadrant. Stomach/Bowel: Stomach is within normal limits. Appendix is not seen. Multiple anastomotic sites are noted in the pelvis. No evidence of bowel wall thickening, distention, or  inflammatory changes. No free air or pneumatosis. Vascular/Lymphatic: Aortic atherosclerosis. No abdominal or pelvic lymphadenopathy by size criteria. Reproductive: Status post hysterectomy. No adnexal masses. Other: No abdominopelvic ascites. Musculoskeletal: Degenerative changes are present in the thoracolumbar spine. No acute or suspicious osseous abnormality. 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 There is fragmentation of the pubis symphysis on the right. There is a lucency in the pubis symphysis on the left with sclerosis which is new from the previous exam. Fixation hardware is noted in the proximal left femur. A mixed lytic and sclerotic lesion is noted in the iliac bone on the left. IMPRESSION: 1. Left renal calculus and renal atrophy. Bilateral ureteral stents in place with no evidence of hydroureteronephrosis. 2. Status post cystectomy with right lower quadrant diverting urostomy. 3. Bony fragmentation of the pubic bone on  the left with mixed lytic and sclerotic regions in the pubis symphysis on the left and in the left iliac bone, possible infection versus metastatic disease. 4. Aortic atherosclerosis. Electronically Signed   By: Thornell Sartorius M.D.   On: 07/24/2022 22:52     LOS: 1 day   Lanae Boast, MD [facility]  07/26/2022, 11:47 AM

## 2022-07-27 DIAGNOSIS — N390 Urinary tract infection, site not specified: Secondary | ICD-10-CM | POA: Diagnosis not present

## 2022-07-27 LAB — BASIC METABOLIC PANEL
Anion gap: 10 (ref 5–15)
BUN: 21 mg/dL (ref 8–23)
CO2: 18 mmol/L — ABNORMAL LOW (ref 22–32)
Calcium: 8.7 mg/dL — ABNORMAL LOW (ref 8.9–10.3)
Chloride: 109 mmol/L (ref 98–111)
Creatinine, Ser: 2.25 mg/dL — ABNORMAL HIGH (ref 0.44–1.00)
GFR, Estimated: 23 mL/min — ABNORMAL LOW (ref >60)
Glucose, Bld: 112 mg/dL — ABNORMAL HIGH (ref 70–99)
Potassium: 3.9 mmol/L (ref 3.5–5.1)
Sodium: 137 mmol/L (ref 135–145)

## 2022-07-27 LAB — CBC WITH DIFFERENTIAL/PLATELET
Abs Immature Granulocytes: 0.02 10*3/uL (ref 0.00–0.07)
Basophils Absolute: 0.1 10*3/uL (ref 0.0–0.1)
Basophils Relative: 1 %
Eosinophils Absolute: 0.3 10*3/uL (ref 0.0–0.5)
Eosinophils Relative: 6 %
HCT: 31.8 % — ABNORMAL LOW (ref 36.0–46.0)
Hemoglobin: 9.9 g/dL — ABNORMAL LOW (ref 12.0–15.0)
Immature Granulocytes: 0 %
Lymphocytes Relative: 15 %
Lymphs Abs: 0.8 10*3/uL (ref 0.7–4.0)
MCH: 30.4 pg (ref 26.0–34.0)
MCHC: 31.1 g/dL (ref 30.0–36.0)
MCV: 97.5 fL (ref 80.0–100.0)
Monocytes Absolute: 0.5 10*3/uL (ref 0.1–1.0)
Monocytes Relative: 9 %
Neutro Abs: 3.8 10*3/uL (ref 1.7–7.7)
Neutrophils Relative %: 69 %
Platelets: 389 10*3/uL (ref 150–400)
RBC: 3.26 MIL/uL — ABNORMAL LOW (ref 3.87–5.11)
RDW: 13.4 % (ref 11.5–15.5)
WBC: 5.5 10*3/uL (ref 4.0–10.5)
nRBC: 0 % (ref 0.0–0.2)

## 2022-07-27 LAB — MAGNESIUM: Magnesium: 1.9 mg/dL (ref 1.7–2.4)

## 2022-07-27 LAB — URINE CULTURE

## 2022-07-27 MED ORDER — SODIUM BICARBONATE 650 MG PO TABS
650.0000 mg | ORAL_TABLET | Freq: Three times a day (TID) | ORAL | Status: DC
  Administered 2022-07-27 – 2022-07-28 (×3): 650 mg via ORAL
  Filled 2022-07-27 (×3): qty 1

## 2022-07-27 NOTE — Progress Notes (Signed)
PROGRESS NOTE    Shelia Chan  ZOX:[id number] DOB: 1955/03/10 DOA: 07/24/2022 PCP: Ellyn Hack, MD   Brief Narrative:  67 y.o. 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  On presentation, there was a concern for developing left pyonephritis.  She was started on IV meropenem. Last urine culture on 03/14/2021 revealed ID/sensitivities ESBL E. coli UTI and Pseudomonas aeruginosa UTI.   Assessment & Plan:   Complicated UTI in the setting of previous cystectomy with urostomy bag in place: -History of ESBL E. coli UTI and Pseudomonas aeruginosa UTI -continue current empiric antibiotics with meropenem, urine culture in process, blood culture no growth to date.     Left renal calculus and renal atrophy with bilateral ureteral stent in place: -No hydroureteronephrosis -Outpatient follow-up with urology   AKI on CKD 3B: -likely prerenal in the setting of dehydration and poor oral intake: -Baseline creatinine around 1.6 - 2.0 looking at labs from last year and January of this year  -Creatinine improving to 2.25 today.  Continue IV fluids.  Repeat a.m. labs  Metabolic acidosis -Bicarb 18 today.  Start oral sodium bicarbonate tablets.  Repeat a.m. labs.   Hypokalemia  -resolved   Hypovolemic hyponatremia  -resolved on ivf   Diarrhea -Negative for C. difficile GI panel pending, improving.  Continue probiotic while on antibiotics. -GI PCR positive for Campylobacter species.  Diarrhea has much improved.   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  Monitor intermittently.   HLD continue Lipitor   GERD continue PPI   Chronic HFpEF with chronic  lymphedema: Monitor volume status while on IV fluids. Negative balance of 2210.6 cc since admission.    DVT prophylaxis: Lovenox Code Status: Full Family Communication: None at bedside Disposition Plan: Status is: Inpatient Remains inpatient appropriate because: Of severity of illness.  Need for IV antibiotics.  Urine cultures pending.    Consultants: None  Procedures: None  Antimicrobials: Meropenem   Subjective: Patient seen and examined at bedside.  Feels better.  Diarrhea is improving.  Normal fever, vomiting reported.  Objective: Vitals:   07/26/22 0541 07/26/22 1400 07/26/22 1951 07/27/22 0443  BP: 116/63 112/67 (!) 109/52 (!) 125/52  Pulse: 73 73 75 64  Resp: 18 18 20 16   Temp: 98.2 F (36.8 C) 98.5 F (36.9 C) 99.3 F (37.4 C) 98.6 F (37 C)  TempSrc: Oral Oral    SpO2: 97% 97% 99% 98%  Weight:      Height:        Intake/Output Summary (Last 24 hours) at 07/27/2022 1242 Last data filed at 07/27/2022 0900 Gross per 24 hour  Intake 578 ml  Output 2200 ml  Net -1622 ml   Filed Weights   07/24/22 2058  Weight: 57.6 kg    Examination:  General exam: Appears calm and comfortable.  On room air.  Looks chronically ill and deconditioned. Respiratory system: Bilateral decreased breath sounds at bases Cardiovascular system: S1 & S2 heard, Rate controlled Gastrointestinal system: Abdomen is nondistended, soft and nontender. Normal bowel sounds heard.  Urostomy bag in place. Extremities: No cyanosis, clubbing, edema  Central nervous system: Alert and oriented. No focal neurological deficits. Moving extremities Skin: No rashes, lesions or ulcers Psychiatry: Judgement  and insight appear normal. Mood & affect appropriate.     Data Reviewed: I have personally reviewed following labs and imaging studies  CBC: Recent Labs  Lab 07/24/22 2214 07/25/22 0108 07/26/22 0548 07/27/22 0908  WBC 11.0* 10.9* 6.5 5.5  NEUTROABS  --   --   --  3.8  HGB 10.4* 11.0*  9.8* 9.9*  HCT 31.9* 33.1* 31.0* 31.8*  MCV 94.4 94.0 95.7 97.5  PLT 431* 446* 387 389   Basic Metabolic Panel: Recent Labs  Lab 07/24/22 2214 07/25/22 0108 07/26/22 0548 07/27/22 0908  NA 127* 129* 136 137  K 3.1* 3.2* 3.6 3.9  CL 92* 95* 105 109  CO2 22 24 21* 18*  GLUCOSE 99 105* 106* 112*  BUN 33* 33* 26* 21  CREATININE 2.61* 2.67* 2.40* 2.25*  CALCIUM 8.7* 8.8* 8.8* 8.7*  MG  --  1.7  --  1.9  PHOS  --  3.4  --   --    GFR: Estimated Creatinine Clearance: 19.5 mL/min (A) (by C-G formula based on SCr of 2.25 mg/dL (H)). Liver Function Tests: Recent Labs  Lab 07/24/22 2214  AST 16  ALT 15  ALKPHOS 113  BILITOT 0.5  PROT 7.2  ALBUMIN 3.4*   No results for input(s): "LIPASE", "AMYLASE" in the last 168 hours. No results for input(s): "AMMONIA" in the last 168 hours. Coagulation Profile: No results for input(s): "INR", "PROTIME" in the last 168 hours. Cardiac Enzymes: No results for input(s): "CKTOTAL", "CKMB", "CKMBINDEX", "TROPONINI" in the last 168 hours. BNP (last 3 results) No results for input(s): "PROBNP" in the last 8760 hours. HbA1C: No results for input(s): "HGBA1C" in the last 72 hours. CBG: No results for input(s): "GLUCAP" in the last 168 hours. Lipid Profile: No results for input(s): "CHOL", "HDL", "LDLCALC", "TRIG", "CHOLHDL", "LDLDIRECT" in the last 72 hours. Thyroid Function Tests: No results for input(s): "TSH", "T4TOTAL", "FREET4", "T3FREE", "THYROIDAB" in the last 72 hours. Anemia Panel: No results for input(s): "VITAMINB12", "FOLATE", "FERRITIN", "TIBC", "IRON", "RETICCTPCT" in the last 72 hours. Sepsis Labs: Recent Labs  Lab 07/24/22 2214 07/25/22 0108  LATICACIDVEN 1.5 0.9    Recent Results (from the past 240 hour(s))  Culture, blood (routine x 2)     Status: None (Preliminary result)   Collection Time: 07/24/22 10:14 PM   Specimen: BLOOD RIGHT HAND  Result Value Ref Range Status   Specimen Description   Final    BLOOD RIGHT  HAND Performed at [facility], [address], [place], [place] [address]    Special Requests   Final    BOTTLES DRAWN AEROBIC AND ANAEROBIC Blood Culture results may not be optimal due to an inadequate volume of blood received in culture bottles Performed at [facility], [address] [place], [place] [address]    Culture   Final    NO GROWTH 2 DAYS Performed at [facility], [address] [place], [place] [address]    Report Status PENDING  Incomplete  Culture, blood (routine x 2)     Status: None (Preliminary result)   Collection Time: 07/25/22  1:08 AM   Specimen: BLOOD RIGHT ARM  Result Value Ref Range Status   Specimen Description   Final    BLOOD RIGHT ARM Performed at [facility], [address] [place], [place] [address]    Special Requests   Final    Blood Culture adequate volume BOTTLES DRAWN AEROBIC AND ANAEROBIC Performed at [facility], [address] [place], [place] [address]    Culture   Final    NO GROWTH 2 DAYS Performed at [facility], [address] [place], [place] [address]    Report Status PENDING  Incomplete  Urine Culture (for pregnant, neutropenic or urologic patients or patients with an indwelling urinary catheter)     Status: None (Preliminary result)   Collection Time: 07/25/22  9:11 AM   Specimen: Urine, Suprapubic  Result Value Ref Range Status   Specimen Description   Final    URINE, SUPRAPUBIC Performed at [facility], [address] [place], [place] [address]    Special Requests   Final    NONE Performed at [facility], [address] [place], [place] [address]    Culture   Final    CULTURE REINCUBATED FOR BETTER GROWTH Performed at [facility], [address] [place], [place] [address]    Report Status PENDING  Incomplete  C Difficile Quick Screen w PCR reflex     Status: None   Collection Time: 07/25/22 11:49 AM    Specimen: STOOL  Result Value Ref Range Status   C Diff antigen NEGATIVE NEGATIVE Final   C Diff toxin NEGATIVE NEGATIVE Final   C Diff interpretation No C. difficile detected.  Final    Comment: Performed at [facility], [address] [place], [place] [address]  Gastrointestinal Panel by PCR , Stool     Status: Abnormal   Collection Time: 07/25/22 11:49 AM   Specimen: STOOL  Result Value Ref Range Status   Campylobacter species DETECTED (A) NOT DETECTED Final    Comment: RESULT CALLED TO, READ BACK BY AND VERIFIED WITH: Christena Deem 1212 07/26/22 MU    Plesimonas shigelloides NOT DETECTED NOT DETECTED Final   Salmonella species NOT DETECTED NOT DETECTED Final   Yersinia enterocolitica NOT DETECTED NOT DETECTED Final   Vibrio species NOT DETECTED NOT DETECTED Final   Vibrio cholerae NOT DETECTED NOT DETECTED Final   Enteroaggregative E coli (EAEC) NOT DETECTED NOT DETECTED Final   Enteropathogenic E coli (EPEC) NOT DETECTED NOT DETECTED Final   Enterotoxigenic E coli (ETEC) NOT DETECTED NOT DETECTED Final   Shiga like toxin producing E coli (STEC) NOT DETECTED NOT DETECTED Final   Shigella/Enteroinvasive E coli (EIEC) NOT DETECTED NOT DETECTED Final   Cryptosporidium NOT DETECTED NOT DETECTED Final   Cyclospora cayetanensis NOT DETECTED NOT DETECTED Final   Entamoeba histolytica NOT DETECTED NOT DETECTED Final   Giardia lamblia NOT DETECTED NOT DETECTED Final   Adenovirus F40/41 NOT DETECTED NOT DETECTED Final   Astrovirus NOT DETECTED NOT DETECTED Final   Norovirus GI/GII NOT DETECTED NOT DETECTED Final   Rotavirus A NOT DETECTED NOT DETECTED Final   Sapovirus (I, II, IV, and V) NOT DETECTED NOT DETECTED Final    Comment: Performed at [facility], [address], [place], [place] [address]         Radiology Studies: No results found.      Scheduled Meds:  ARIPiprazole  30 mg Oral Daily   ascorbic acid  500 mg Oral Daily   aspirin EC   81 mg Oral Daily   atorvastatin  40 mg Oral QHS   B-complex with vitamin C  1 tablet Oral Daily   calcitRIOL  0.25 mcg Oral Daily   cholecalciferol  2,000 Units Oral Daily   DULoxetine  30 mg Oral QHS  enoxaparin (LOVENOX) injection  30 mg Subcutaneous Q24H   ferrous sulfate  325 mg Oral Q breakfast   pantoprazole  40 mg Oral Daily   Continuous Infusions:  sodium chloride 50 mL/hr at 07/27/22 0806   meropenem (MERREM) IV 1 g (07/26/22 2334)          Glade Lloyd, MD [facility] 07/27/2022, 12:42 PM

## 2022-07-27 NOTE — Progress Notes (Signed)
Mobility Specialist - Progress Note   07/27/22 1338  Mobility  Activity Ambulated with assistance in hallway  Level of Assistance Standby assist, set-up cues, supervision of patient - no hands on  Assistive Device None  Distance Ambulated (ft) 400 ft  Range of Motion/Exercises Active  Activity Response Tolerated well  Mobility Referral Yes  $Mobility charge 1 Mobility  Mobility Specialist Start Time (ACUTE ONLY) 1325  Mobility Specialist Stop Time (ACUTE ONLY) 1335  Mobility Specialist Time Calculation (min) (ACUTE ONLY) 10 min   Pt received in bed and agreed to mobility. Had no issues throughout session, returned to bed with all needs met.  Marilynne Halsted Mobility Specialist

## 2022-07-28 ENCOUNTER — Other Ambulatory Visit (HOSPITAL_COMMUNITY): Payer: Self-pay

## 2022-07-28 DIAGNOSIS — N390 Urinary tract infection, site not specified: Secondary | ICD-10-CM | POA: Diagnosis not present

## 2022-07-28 LAB — BASIC METABOLIC PANEL
Anion gap: 8 (ref 5–15)
BUN: 20 mg/dL (ref 8–23)
CO2: 20 mmol/L — ABNORMAL LOW (ref 22–32)
Calcium: 8.4 mg/dL — ABNORMAL LOW (ref 8.9–10.3)
Chloride: 106 mmol/L (ref 98–111)
Creatinine, Ser: 2.21 mg/dL — ABNORMAL HIGH (ref 0.44–1.00)
GFR, Estimated: 24 mL/min — ABNORMAL LOW (ref >60)
Glucose, Bld: 107 mg/dL — ABNORMAL HIGH (ref 70–99)
Potassium: 3.6 mmol/L (ref 3.5–5.1)
Sodium: 134 mmol/L — ABNORMAL LOW (ref 135–145)

## 2022-07-28 LAB — MAGNESIUM: Magnesium: 1.9 mg/dL (ref 1.7–2.4)

## 2022-07-28 MED ORDER — OXYCODONE HCL 5 MG PO TABS
5.0000 mg | ORAL_TABLET | Freq: Four times a day (QID) | ORAL | 0 refills | Status: DC | PRN
  Filled 2022-07-28: qty 120, 30d supply, fill #0

## 2022-07-28 MED ORDER — FOSFOMYCIN TROMETHAMINE 3 G PO PACK
3.0000 g | PACK | Freq: Once | ORAL | Status: AC
  Administered 2022-07-28: 3 g via ORAL
  Filled 2022-07-28: qty 3

## 2022-07-28 MED ORDER — SODIUM BICARBONATE 650 MG PO TABS: 650.0000 mg | ORAL_TABLET | Freq: Three times a day (TID) | ORAL | 0 refills | Status: DC

## 2022-07-28 NOTE — Progress Notes (Signed)
Patient provided with discharge education, patient verbalized understanding.  

## 2022-07-28 NOTE — Discharge Summary (Signed)
Physician Discharge Summary  Shelia Chan ZOX:[id number] DOB: 01/06/1956 DOA: 07/24/2022  PCP: Ellyn Hack, MD  Admit date: 07/24/2022 Discharge date: 07/28/2022  Admitted From: Home Disposition: Home  Recommendations for Outpatient Follow-up:  Follow up with PCP in 1 week with repeat CBC/BMP Outpatient follow-up with urology Follow up in ED if symptoms worsen or new appear   Home Health: No Equipment/Devices: None  Discharge Condition: Stable CODE STATUS: Full Diet recommendation: Heart healthy  Brief/Interim Summary: 67 y.o. 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  On presentation, there was a concern for developing left pyonephritis.  She was started on IV meropenem. Last urine culture on 03/14/2021 revealed ID/sensitivities ESBL E. coli UTI and Pseudomonas aeruginosa UTI.  During the hospitalization, her condition has improved.  Urine cultures grew mixed organisms.  She feels much better and wants to go home today.  She will get 1 dose of oral fosfomycin and will be discharged home today.  Outpatient follow-up with PCP and urology.  Discharge Diagnoses:   Complicated UTI in the setting of previous cystectomy with urostomy bag in place: -History of ESBL E. coli UTI and Pseudomonas aeruginosa UTI -Treated with empiric antibiotics with meropenem.  Blood culture no growth to date.   -Urine cultures grew mixed organisms -She feels much better and wants to go home today.  She will get 1 dose of oral fosfomycin and will be discharged home today.  Outpatient follow-up with PCP and urology.   Left renal calculus and renal atrophy with bilateral ureteral stent  in place: -No hydroureteronephrosis -Outpatient follow-up with urology   AKI on CKD 3B: -likely prerenal in the setting of dehydration and poor oral intake: -Baseline creatinine around 1.6 - 2.0 looking at labs from last year and January of this year  -Creatinine improving to 2.21 today.  Treated with IV fluids.  Outpatient follow-up.  Diuretics to remain on hold till reevaluation by PCP.  Metabolic acidosis -Bicarb 20 today.  Currently on oral sodium bicarbonate tablets: Continue on discharge.  Outpatient follow-up with PCP.  Repeat labs as an outpatient within a week.  Hypokalemia  -resolved   Hypovolemic hyponatremia  -Mild.  Outpatient follow-up.  Diarrhea -Negative for C. Difficile -GI PCR positive for Campylobacter species.  Diarrhea has much improved.   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  Monitor intermittently as an outpatient.   HLD continue Lipitor   GERD continue PPI   Chronic HFpEF with chronic lymphedema: Compensated.  Diuretic plan as above.  Outpatient follow-up with cardiology and PCP.   Discharge Instructions  Discharge Instructions     Diet - low sodium heart healthy   Complete by: As directed    Increase activity slowly   Complete by: As directed       Allergies as of 07/28/2022       Reactions   Metoprolol Shortness Of Breath   Shellfish-derived Products Other (See Comments)        Medication List     STOP taking these medications    diltiazem 120 MG 24 hr capsule Commonly known as: CARDIZEM CD   furosemide 40 MG tablet Commonly known as: LASIX   HYDROcodone-acetaminophen 5-325 MG tablet Commonly known as: NORCO/VICODIN  MULTIVITAMIN ADULT PO       TAKE these medications    acetaminophen 500 MG tablet Commonly known as: TYLENOL Take 500-1,000 mg by mouth every 6 (six) hours as needed for moderate pain or fever.   albuterol 108 (90 Base) MCG/ACT inhaler Commonly known as: VENTOLIN HFA Inhale 1 puff  into the lungs every 6 (six) hours as needed for shortness of breath.   ARIPiprazole 30 MG tablet Commonly known as: ABILIFY Take 30 mg by mouth daily.   ascorbic acid 500 MG tablet Commonly known as: VITAMIN C Take 500 mg by mouth daily.   aspirin EC 81 MG tablet Take 81 mg by mouth daily.   atorvastatin 40 MG tablet Commonly known as: LIPITOR Take 1 tablet daily at bedtime.   B-complex with vitamin C tablet Take 1 tablet by mouth daily.   busPIRone 7.5 MG tablet Commonly known as: BUSPAR Take 7.5 mg by mouth 2 (two) times daily as needed (anxiety).   calcitRIOL 0.25 MCG capsule Commonly known as: ROCALTROL Take 0.25 mcg by mouth daily.   DULoxetine 30 MG capsule Commonly known as: CYMBALTA Take 30 mg by mouth at bedtime.   ferrous sulfate 324 MG Tbec Take 324 mg by mouth daily with breakfast.   oxyCODONE 5 MG immediate release tablet Commonly known as: Oxy IR/ROXICODONE Take 1 tablet (5 mg) by mouth every 6 hours as needed for severe pain.   pantoprazole 40 MG tablet Commonly known as: PROTONIX Take 40 mg by mouth daily.   potassium chloride 20 MEQ packet Commonly known as: KLOR-CON Take 20 mEq by mouth daily.   Prolia 60 MG/ML Sosy injection Generic drug: denosumab Inject 60 mg Harrison every 6 months   sodium bicarbonate 650 MG tablet Take 1 tablet (650 mg total) by mouth 3 (three) times daily.   Vitamin D-3 25 MCG (1000 UT) Caps Take 2,000 Units by mouth daily.        Follow-up Information     Ellyn Hack, MD. Schedule an appointment as soon as possible for a visit in 1 week(s).   Specialty: Family Medicine Why: With repeat CBC/BMP Contact information: [address] [place] [address] [phone]                Allergies  Allergen Reactions   Metoprolol Shortness Of Breath   Shellfish-Derived Products Other (See Comments)    Consultations: None   Procedures/Studies: CT ABDOMEN PELVIS WO CONTRAST  Result Date:  07/24/2022 CLINICAL DATA:  Abdominal pain, acute, nonlocalized. Left flank pain. EXAM: CT ABDOMEN AND PELVIS WITHOUT CONTRAST TECHNIQUE: Multidetector CT imaging of the abdomen and pelvis was performed following the standard protocol without IV contrast. RADIATION DOSE REDUCTION: This exam was performed according to the departmental dose-optimization program which includes automated exposure control, adjustment of the mA and/or kV according to patient size and/or use of iterative reconstruction technique. COMPARISON:  04/20/2021. FINDINGS: Lower chest: Mild atelectasis is noted bilaterally. Hepatobiliary: No focal liver abnormality is seen. No gallstones, gallbladder wall thickening, or biliary dilatation. Pancreas: Unremarkable. No pancreatic ductal dilatation or surrounding inflammatory changes. Spleen: Normal in size without focal abnormality. Adrenals/Urinary Tract: The adrenal glands are within normal limits. Renal atrophy is noted on the left. There is a nonobstructive left renal calculus. Bilateral ureteral stents are noted and there is no hydroureteronephrosis. No ureteral calculus is seen. Status post cystectomy with diverting urostomy in the right lower quadrant. Stomach/Bowel: Stomach is within normal limits. Appendix is not seen. Multiple anastomotic sites  are noted in the pelvis. No evidence of bowel wall thickening, distention, or inflammatory changes. No free air or pneumatosis. Vascular/Lymphatic: Aortic atherosclerosis. No abdominal or pelvic lymphadenopathy by size criteria. Reproductive: Status post hysterectomy. No adnexal masses. Other: No abdominopelvic ascites. Musculoskeletal: Degenerative changes are present in the thoracolumbar spine. No acute or suspicious osseous abnormality. 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 There is fragmentation of the pubis symphysis on the right. There is a lucency in the pubis symphysis on the left with sclerosis which  is new from the previous exam. Fixation hardware is noted in the proximal left femur. A mixed lytic and sclerotic lesion is noted in the iliac bone on the left. IMPRESSION: 1. Left renal calculus and renal atrophy. Bilateral ureteral stents in place with no evidence of hydroureteronephrosis. 2. Status post cystectomy with right lower quadrant diverting urostomy. 3. Bony fragmentation of the pubic bone on the left with mixed lytic and sclerotic regions in the pubis symphysis on the left and in the left iliac bone, possible infection versus metastatic disease. 4. Aortic atherosclerosis. Electronically Signed   By: Thornell Sartorius M.D.   On: 07/24/2022 22:52   IR EXT NEPHROURETERAL CATH EXCHANGE  Result Date: 06/30/2022 INDICATION: Chronic indwelling bilateral nephroureteral catheters via ileostomy requiring routine exchange. EXAM: BILATERAL NEPHROURETERAL CATHETER EXCHANGE UNDER FLUOROSCOPY COMPARISON:  05/12/2022 MEDICATIONS: None ANESTHESIA/SEDATION: None CONTRAST:  15 mL Omnipaque 300-administered into the collecting system(s) FLUOROSCOPY: Radiation Exposure Index (as provided by the fluoroscopic device): 6.0 mGy Kerma COMPLICATIONS: None immediate. PROCEDURE: Informed written consent was obtained from the patient after a thorough discussion of the procedural risks, benefits and alternatives. All questions were addressed. Maximal Sterile Barrier Technique was utilized including caps, mask, sterile gowns, sterile gloves, sterile drape, hand hygiene and skin antiseptic. A timeout was performed prior to the initiation of the procedure. Bilateral 12 French nephroureteral catheters extending via an ileostomy through the ureters into the level of the renal collecting systems were both injected with contrast, cut and removed over guidewires. New 12 French, 45 cm multipurpose catheters were then advanced over the guidewires into the level of the renal pelvis bilaterally. Final catheter position was confirmed by  fluoroscopic spot images after catheter contrast injection. Both exiting nephroureteral catheters were placed in a new ileostomy bag. IMPRESSION: Routine exchange of bilateral 12 French nephroureteral catheters via ileostomy under fluoroscopy. Electronically Signed   By: Irish Lack M.D.   On: 06/30/2022 15:10      Subjective: Patient seen and examined at bedside.  Feels much better and was 200.  Denies fever or vomiting.  Discharge Exam: Vitals:   07/27/22 2001 07/28/22 0623  BP: 97/80 136/76  Pulse: 79 (!) 59  Resp: 20 20  Temp: 98.3 F (36.8 C) 98.4 F (36.9 C)  SpO2: 99% 98%    General: Pt is alert, awake, not in acute distress.  Very thinly built.  On room air. Cardiovascular: Mild intermittent bradycardia present; S1/S2 + Respiratory: bilateral decreased breath sounds at bases Abdominal: Soft, NT, ND, bowel sounds +; urostomy bag in place Extremities: no edema, no cyanosis    The results of significant diagnostics from this hospitalization (including imaging, microbiology, ancillary and laboratory) are listed below for reference.     Microbiology: Recent Results (from the past 240 hour(s))  Culture, blood (routine x 2)     Status: None (Preliminary result)   Collection Time: 07/24/22 10:14 PM   Specimen: BLOOD RIGHT HAND  Result Value Ref Range Status  Specimen Description   Final    BLOOD RIGHT HAND Performed at [facility], [address] [place], [place] [address]    Special Requests   Final    BOTTLES DRAWN AEROBIC AND ANAEROBIC Blood Culture results may not be optimal due to an inadequate volume of blood received in culture bottles Performed at [facility], [address], [place], [place] [address]    Culture   Final    NO GROWTH 3 DAYS Performed at [facility], [address] [place], [place] [address]    Report Status PENDING  Incomplete  Culture, blood (routine x 2)     Status: None (Preliminary result)    Collection Time: 07/25/22  1:08 AM   Specimen: BLOOD RIGHT ARM  Result Value Ref Range Status   Specimen Description   Final    BLOOD RIGHT ARM Performed at [facility], [address] [place], [place] [address]    Special Requests   Final    Blood Culture adequate volume BOTTLES DRAWN AEROBIC AND ANAEROBIC Performed at [facility], [address], [place], [place] [address]    Culture   Final    NO GROWTH 3 DAYS Performed at [facility], [address], [place], [place] [address]    Report Status PENDING  Incomplete  Urine Culture (for pregnant, neutropenic or urologic patients or patients with an indwelling urinary catheter)     Status: Abnormal   Collection Time: 07/25/22  9:11 AM   Specimen: Urine, Suprapubic  Result Value Ref Range Status   Specimen Description   Final    URINE, SUPRAPUBIC Performed at [facility], [address], [place], [place] [address]    Special Requests   Final    NONE Performed at [facility], [address] [place], [place] [address]    Culture MULTIPLE SPECIES PRESENT, SUGGEST RECOLLECTION (A)  Final   Report Status 07/27/2022 FINAL  Final  C Difficile Quick Screen w PCR reflex     Status: None   Collection Time: 07/25/22 11:49 AM   Specimen: STOOL  Result Value Ref Range Status   C Diff antigen NEGATIVE NEGATIVE Final   C Diff toxin NEGATIVE NEGATIVE Final   C Diff interpretation No C. difficile detected.  Final    Comment: Performed at [facility], [address] [place], [place] [address]  Gastrointestinal Panel by PCR , Stool     Status: Abnormal   Collection Time: 07/25/22 11:49 AM   Specimen: STOOL  Result Value Ref Range Status   Campylobacter species DETECTED (A) NOT DETECTED Final    Comment: RESULT CALLED TO, READ BACK BY AND VERIFIED WITH: Christena Deem 1212 07/26/22 MU    Plesimonas shigelloides NOT DETECTED NOT DETECTED Final    Salmonella species NOT DETECTED NOT DETECTED Final   Yersinia enterocolitica NOT DETECTED NOT DETECTED Final   Vibrio species NOT DETECTED NOT DETECTED Final   Vibrio cholerae NOT DETECTED NOT DETECTED Final   Enteroaggregative E coli (EAEC) NOT DETECTED NOT DETECTED Final   Enteropathogenic E coli (EPEC) NOT DETECTED NOT DETECTED Final   Enterotoxigenic E coli (ETEC) NOT DETECTED NOT DETECTED Final   Shiga like toxin producing E coli (STEC) NOT DETECTED NOT DETECTED Final   Shigella/Enteroinvasive E coli (EIEC) NOT DETECTED NOT DETECTED Final   Cryptosporidium NOT DETECTED NOT DETECTED Final   Cyclospora cayetanensis NOT DETECTED NOT DETECTED Final  Entamoeba histolytica NOT DETECTED NOT DETECTED Final   Giardia lamblia NOT DETECTED NOT DETECTED Final   Adenovirus F40/41 NOT DETECTED NOT DETECTED Final   Astrovirus NOT DETECTED NOT DETECTED Final   Norovirus GI/GII NOT DETECTED NOT DETECTED Final   Rotavirus A NOT DETECTED NOT DETECTED Final   Sapovirus (I, II, IV, and V) NOT DETECTED NOT DETECTED Final    Comment: Performed at [facility], [address] [place], [place] [address]     Labs: BNP (last 3 results) No results for input(s): "BNP" in the last 8760 hours. Basic Metabolic Panel: Recent Labs  Lab 07/24/22 2214 07/25/22 0108 07/26/22 0548 07/27/22 0908 07/28/22 0538  NA 127* 129* 136 137 134*  K 3.1* 3.2* 3.6 3.9 3.6  CL 92* 95* 105 109 106  CO2 22 24 21* 18* 20*  GLUCOSE 99 105* 106* 112* 107*  BUN 33* 33* 26* 21 20  CREATININE 2.61* 2.67* 2.40* 2.25* 2.21*  CALCIUM 8.7* 8.8* 8.8* 8.7* 8.4*  MG  --  1.7  --  1.9 1.9  PHOS  --  3.4  --   --   --    Liver Function Tests: Recent Labs  Lab 07/24/22 2214  AST 16  ALT 15  ALKPHOS 113  BILITOT 0.5  PROT 7.2  ALBUMIN 3.4*   No results for input(s): "LIPASE", "AMYLASE" in the last 168 hours. No results for input(s): "AMMONIA" in the last 168 hours. CBC: Recent Labs  Lab 07/24/22 2214  07/25/22 0108 07/26/22 0548 07/27/22 0908  WBC 11.0* 10.9* 6.5 5.5  NEUTROABS  --   --   --  3.8  HGB 10.4* 11.0* 9.8* 9.9*  HCT 31.9* 33.1* 31.0* 31.8*  MCV 94.4 94.0 95.7 97.5  PLT 431* 446* 387 389   Cardiac Enzymes: No results for input(s): "CKTOTAL", "CKMB", "CKMBINDEX", "TROPONINI" in the last 168 hours. BNP: Invalid input(s): "POCBNP" CBG: No results for input(s): "GLUCAP" in the last 168 hours. D-Dimer No results for input(s): "DDIMER" in the last 72 hours. Hgb A1c No results for input(s): "HGBA1C" in the last 72 hours. Lipid Profile No results for input(s): "CHOL", "HDL", "LDLCALC", "TRIG", "CHOLHDL", "LDLDIRECT" in the last 72 hours. Thyroid function studies No results for input(s): "TSH", "T4TOTAL", "T3FREE", "THYROIDAB" in the last 72 hours.  Invalid input(s): "FREET3" Anemia work up No results for input(s): "VITAMINB12", "FOLATE", "FERRITIN", "TIBC", "IRON", "RETICCTPCT" in the last 72 hours. Urinalysis    Component Value Date/Time   COLORURINE YELLOW 07/24/2022 2127   APPEARANCEUR CLOUDY (A) 07/24/2022 2127   LABSPEC 1.008 07/24/2022 2127   PHURINE 5.0 07/24/2022 2127   GLUCOSEU NEGATIVE 07/24/2022 2127   HGBUR NEGATIVE 07/24/2022 2127   BILIRUBINUR NEGATIVE 07/24/2022 2127   KETONESUR NEGATIVE 07/24/2022 2127   PROTEINUR 100 (A) 07/24/2022 2127   NITRITE NEGATIVE 07/24/2022 2127   LEUKOCYTESUR LARGE (A) 07/24/2022 2127   Sepsis Labs Recent Labs  Lab 07/24/22 2214 07/25/22 0108 07/26/22 0548 07/27/22 0908  WBC 11.0* 10.9* 6.5 5.5   Microbiology Recent Results (from the past 240 hour(s))  Culture, blood (routine x 2)     Status: None (Preliminary result)   Collection Time: 07/24/22 10:14 PM   Specimen: BLOOD RIGHT HAND  Result Value Ref Range Status   Specimen Description   Final    BLOOD RIGHT HAND Performed at [facility], [address], [place], [place] [address]    Special Requests   Final    BOTTLES DRAWN AEROBIC AND ANAEROBIC  Blood Culture results may not be optimal due to an inadequate volume of blood received in culture bottles Performed at [facility], [address] [place], [place] [address]    Culture   Final    NO GROWTH 3 DAYS Performed at [facility], [address], [place], [place] [address]    Report Status PENDING  Incomplete  Culture, blood (routine x 2)     Status: None (Preliminary result)   Collection Time: 07/25/22  1:08 AM   Specimen: BLOOD RIGHT ARM  Result Value Ref Range Status   Specimen Description   Final    BLOOD RIGHT ARM Performed at [facility], [address] [place], [place] [address]    Special Requests   Final    Blood Culture adequate volume BOTTLES DRAWN AEROBIC AND ANAEROBIC Performed at [facility], [address], [place], [place] [address]    Culture   Final    NO GROWTH 3 DAYS Performed at [facility], [address], [place], [place] [address]    Report Status PENDING  Incomplete  Urine Culture (for pregnant, neutropenic or urologic patients or patients with an indwelling urinary catheter)     Status: Abnormal   Collection Time: 07/25/22  9:11 AM   Specimen: Urine, Suprapubic  Result Value Ref Range Status   Specimen Description   Final    URINE, SUPRAPUBIC Performed at [facility], [address], [place], [place] [address]    Special Requests   Final    NONE Performed at [facility], [address], [place], [place] [address]    Culture MULTIPLE SPECIES PRESENT, SUGGEST RECOLLECTION (A)  Final   Report Status 07/27/2022 FINAL  Final  C Difficile Quick Screen w PCR reflex     Status: None   Collection Time: 07/25/22 11:49 AM   Specimen: STOOL  Result Value Ref Range Status   C Diff antigen NEGATIVE NEGATIVE Final   C Diff toxin NEGATIVE NEGATIVE Final   C Diff interpretation No C. difficile detected.  Final    Comment: Performed at [facility], [address], [place], [place] [address]  Gastrointestinal Panel by PCR , Stool     Status: Abnormal   Collection Time: 07/25/22 11:49 AM   Specimen: STOOL  Result Value Ref Range Status   Campylobacter species DETECTED (A) NOT DETECTED Final    Comment: RESULT CALLED TO, READ BACK BY AND VERIFIED WITH: Christena Deem 1212 07/26/22 MU    Plesimonas shigelloides NOT DETECTED NOT DETECTED Final   Salmonella species NOT DETECTED NOT DETECTED Final   Yersinia enterocolitica NOT DETECTED NOT DETECTED Final   Vibrio species NOT DETECTED NOT DETECTED Final   Vibrio cholerae NOT DETECTED NOT DETECTED Final   Enteroaggregative E coli (EAEC) NOT DETECTED NOT DETECTED Final   Enteropathogenic E coli (EPEC) NOT DETECTED NOT DETECTED Final   Enterotoxigenic E coli (ETEC) NOT DETECTED NOT DETECTED Final   Shiga like toxin producing E coli (STEC) NOT DETECTED NOT DETECTED Final   Shigella/Enteroinvasive E coli (EIEC) NOT DETECTED NOT DETECTED Final   Cryptosporidium NOT DETECTED NOT DETECTED Final   Cyclospora cayetanensis NOT DETECTED NOT DETECTED Final   Entamoeba histolytica NOT DETECTED NOT DETECTED Final   Giardia lamblia NOT DETECTED NOT DETECTED Final   Adenovirus F40/41 NOT DETECTED NOT DETECTED Final   Astrovirus NOT DETECTED NOT DETECTED Final   Norovirus GI/GII NOT DETECTED  NOT DETECTED Final   Rotavirus A NOT DETECTED NOT DETECTED Final   Sapovirus (I, II, IV, and V) NOT DETECTED NOT DETECTED Final    Comment: Performed at [facility], [address] [place], [place] [address]     Time coordinating discharge: 35 minutes  SIGNED:   Glade Lloyd, MD  [facility] 07/28/2022, 11:26 AM

## 2022-07-28 NOTE — Plan of Care (Signed)

## 2022-07-29 LAB — CULTURE, BLOOD (ROUTINE X 2)
Culture: NO GROWTH
Culture: NO GROWTH
Special Requests: ADEQUATE

## 2022-07-30 LAB — CULTURE, BLOOD (ROUTINE X 2)

## 2022-08-09 ENCOUNTER — Other Ambulatory Visit (HOSPITAL_COMMUNITY): Payer: Self-pay | Admitting: Medical

## 2022-08-09 ENCOUNTER — Ambulatory Visit (HOSPITAL_COMMUNITY)
Admission: RE | Admit: 2022-08-09 | Discharge: 2022-08-09 | Disposition: A | Discharge status: Home / Self Care | Payer: 59 | Source: Ambulatory Visit | Attending: Cardiology | Admitting: Cardiology

## 2022-08-09 DIAGNOSIS — M79662 Pain in left lower leg: Secondary | ICD-10-CM

## 2022-08-09 DIAGNOSIS — M79605 Pain in left leg: Secondary | ICD-10-CM | POA: Diagnosis present

## 2022-08-18 ENCOUNTER — Ambulatory Visit (HOSPITAL_COMMUNITY)
Admission: RE | Admit: 2022-08-18 | Discharge: 2022-08-18 | Disposition: A | Discharge status: Home / Self Care | Payer: 59 | Source: Ambulatory Visit | Attending: Interventional Radiology | Admitting: Interventional Radiology

## 2022-08-18 ENCOUNTER — Other Ambulatory Visit (HOSPITAL_COMMUNITY): Payer: Self-pay | Admitting: Radiology

## 2022-08-18 DIAGNOSIS — Z436 Encounter for attention to other artificial openings of urinary tract: Secondary | ICD-10-CM | POA: Diagnosis not present

## 2022-08-18 DIAGNOSIS — C680 Malignant neoplasm of urethra: Secondary | ICD-10-CM | POA: Diagnosis not present

## 2022-08-18 HISTORY — PX: IR EXT NEPHROURETERAL CATH EXCHANGE: IMG5418

## 2022-08-18 MED ORDER — IOHEXOL 300 MG/ML  SOLN
50.0000 mL | Freq: Once | INTRAMUSCULAR | Status: AC | PRN
  Administered 2022-08-18: 15 mL

## 2022-08-22 ENCOUNTER — Other Ambulatory Visit (HOSPITAL_COMMUNITY): Payer: Self-pay

## 2022-08-22 MED ORDER — CEFDINIR 300 MG PO CAPS
300.0000 mg | ORAL_CAPSULE | Freq: Two times a day (BID) | ORAL | 0 refills | Status: DC
  Filled 2022-08-22: qty 20, 10d supply, fill #0

## 2022-08-26 ENCOUNTER — Other Ambulatory Visit (HOSPITAL_COMMUNITY): Payer: Self-pay

## 2022-08-26 MED ORDER — OXYCODONE HCL 5 MG PO TABS
5.0000 mg | ORAL_TABLET | Freq: Four times a day (QID) | ORAL | 0 refills | Status: DC | PRN
  Filled 2022-08-26 – 2022-08-27 (×2): qty 120, 30d supply, fill #0

## 2022-08-27 ENCOUNTER — Other Ambulatory Visit (HOSPITAL_COMMUNITY): Payer: Self-pay

## 2022-09-30 ENCOUNTER — Other Ambulatory Visit (HOSPITAL_COMMUNITY): Payer: Self-pay

## 2022-09-30 MED ORDER — OXYCODONE HCL 5 MG PO TABS
5.0000 mg | ORAL_TABLET | Freq: Four times a day (QID) | ORAL | 0 refills | Status: DC
  Filled 2022-09-30: qty 120, 30d supply, fill #0

## 2022-10-06 ENCOUNTER — Ambulatory Visit (HOSPITAL_COMMUNITY)
Admission: RE | Admit: 2022-10-06 | Discharge: 2022-10-06 | Disposition: A | Discharge status: Home / Self Care | Payer: 59 | Source: Ambulatory Visit | Attending: Interventional Radiology | Admitting: Interventional Radiology

## 2022-10-06 ENCOUNTER — Other Ambulatory Visit (HOSPITAL_COMMUNITY): Payer: Self-pay | Admitting: Radiology

## 2022-10-06 DIAGNOSIS — C680 Malignant neoplasm of urethra: Secondary | ICD-10-CM

## 2022-10-06 DIAGNOSIS — Z436 Encounter for attention to other artificial openings of urinary tract: Secondary | ICD-10-CM | POA: Insufficient documentation

## 2022-10-06 HISTORY — PX: IR EXT NEPHROURETERAL CATH EXCHANGE: IMG5418

## 2022-10-06 MED ORDER — IOHEXOL 300 MG/ML  SOLN
50.0000 mL | Freq: Once | INTRAMUSCULAR | Status: AC | PRN
  Administered 2022-10-06: 10 mL

## 2022-10-08 ENCOUNTER — Encounter (HOSPITAL_COMMUNITY): Payer: Self-pay

## 2022-11-01 ENCOUNTER — Other Ambulatory Visit (HOSPITAL_COMMUNITY): Payer: Self-pay

## 2022-11-01 MED ORDER — OXYCODONE HCL 5 MG PO TABS
5.0000 mg | ORAL_TABLET | Freq: Four times a day (QID) | ORAL | 0 refills | Status: DC | PRN
  Filled 2022-11-01: qty 120, 30d supply, fill #0

## 2022-11-04 ENCOUNTER — Other Ambulatory Visit: Payer: Self-pay

## 2022-11-24 ENCOUNTER — Ambulatory Visit (HOSPITAL_COMMUNITY)
Admission: RE | Admit: 2022-11-24 | Discharge: 2022-11-24 | Disposition: A | Discharge status: Home / Self Care | Payer: 59 | Source: Ambulatory Visit | Attending: Interventional Radiology | Admitting: Interventional Radiology

## 2022-11-24 ENCOUNTER — Other Ambulatory Visit (HOSPITAL_COMMUNITY): Payer: Self-pay | Admitting: Diagnostic Radiology

## 2022-11-24 ENCOUNTER — Other Ambulatory Visit: Payer: Self-pay

## 2022-11-24 DIAGNOSIS — C680 Malignant neoplasm of urethra: Secondary | ICD-10-CM | POA: Diagnosis not present

## 2022-11-24 DIAGNOSIS — Z436 Encounter for attention to other artificial openings of urinary tract: Secondary | ICD-10-CM | POA: Diagnosis present

## 2022-11-24 HISTORY — PX: IR EXT NEPHROURETERAL CATH EXCHANGE: IMG5418

## 2022-11-24 MED ORDER — IOHEXOL 300 MG/ML  SOLN
50.0000 mL | Freq: Once | INTRAMUSCULAR | Status: AC | PRN
  Administered 2022-11-24: 20 mL

## 2022-11-24 NOTE — Procedures (Signed)
Interventional Radiology Procedure:   Indications: Chronic bilateral nephroureteral catheters via ileal conduit.  Routine exchange.  Procedure: Exchange of bilateral nephroureteral catheters  Findings: Difficulty exchanging both catheters due to encrustations.  New 12 Fr x 45 cm catheters placed.    Complications: None     EBL: Minimal  Plan: 6 week routine exchange.   Esme Freund R. Lowella Dandy, MD  Pager: [phone]

## 2022-12-02 ENCOUNTER — Other Ambulatory Visit (HOSPITAL_COMMUNITY): Payer: Self-pay

## 2022-12-02 MED ORDER — OXYCODONE HCL 5 MG PO TABS
5.0000 mg | ORAL_TABLET | Freq: Four times a day (QID) | ORAL | 0 refills | Status: DC | PRN
  Filled 2022-12-02: qty 120, 30d supply, fill #0

## 2022-12-22 ENCOUNTER — Other Ambulatory Visit: Payer: Self-pay

## 2022-12-23 ENCOUNTER — Other Ambulatory Visit: Payer: Self-pay

## 2022-12-29 ENCOUNTER — Inpatient Hospital Stay (HOSPITAL_COMMUNITY)
Admission: EM | Admit: 2022-12-29 | Discharge: 2022-12-31 | DRG: 699 | Disposition: A | Discharge status: Home / Self Care | Payer: 59 | Attending: Internal Medicine | Admitting: Internal Medicine

## 2022-12-29 ENCOUNTER — Encounter (HOSPITAL_COMMUNITY): Payer: Self-pay

## 2022-12-29 ENCOUNTER — Other Ambulatory Visit: Payer: Self-pay

## 2022-12-29 ENCOUNTER — Emergency Department (HOSPITAL_COMMUNITY): Payer: 59

## 2022-12-29 DIAGNOSIS — E876 Hypokalemia: Secondary | ICD-10-CM | POA: Diagnosis present

## 2022-12-29 DIAGNOSIS — Z888 Allergy status to other drugs, medicaments and biological substances status: Secondary | ICD-10-CM

## 2022-12-29 DIAGNOSIS — Z79899 Other long term (current) drug therapy: Secondary | ICD-10-CM | POA: Diagnosis not present

## 2022-12-29 DIAGNOSIS — N390 Urinary tract infection, site not specified: Secondary | ICD-10-CM | POA: Diagnosis not present

## 2022-12-29 DIAGNOSIS — Z7982 Long term (current) use of aspirin: Secondary | ICD-10-CM | POA: Diagnosis not present

## 2022-12-29 DIAGNOSIS — J441 Chronic obstructive pulmonary disease with (acute) exacerbation: Secondary | ICD-10-CM | POA: Diagnosis present

## 2022-12-29 DIAGNOSIS — T83511A Infection and inflammatory reaction due to indwelling urethral catheter, initial encounter: Secondary | ICD-10-CM | POA: Diagnosis not present

## 2022-12-29 DIAGNOSIS — T83518A Infection and inflammatory reaction due to other urinary catheter, initial encounter: Secondary | ICD-10-CM | POA: Diagnosis present

## 2022-12-29 DIAGNOSIS — C679 Malignant neoplasm of bladder, unspecified: Secondary | ICD-10-CM | POA: Diagnosis present

## 2022-12-29 DIAGNOSIS — Z91013 Allergy to seafood: Secondary | ICD-10-CM

## 2022-12-29 DIAGNOSIS — J45901 Unspecified asthma with (acute) exacerbation: Secondary | ICD-10-CM | POA: Diagnosis present

## 2022-12-29 DIAGNOSIS — J449 Chronic obstructive pulmonary disease, unspecified: Secondary | ICD-10-CM | POA: Diagnosis not present

## 2022-12-29 DIAGNOSIS — I5032 Chronic diastolic (congestive) heart failure: Secondary | ICD-10-CM | POA: Diagnosis present

## 2022-12-29 DIAGNOSIS — F418 Other specified anxiety disorders: Secondary | ICD-10-CM | POA: Diagnosis present

## 2022-12-29 DIAGNOSIS — N184 Chronic kidney disease, stage 4 (severe): Secondary | ICD-10-CM | POA: Diagnosis present

## 2022-12-29 DIAGNOSIS — Z8249 Family history of ischemic heart disease and other diseases of the circulatory system: Secondary | ICD-10-CM | POA: Diagnosis not present

## 2022-12-29 DIAGNOSIS — F17210 Nicotine dependence, cigarettes, uncomplicated: Secondary | ICD-10-CM | POA: Diagnosis present

## 2022-12-29 DIAGNOSIS — Y846 Urinary catheterization as the cause of abnormal reaction of the patient, or of later complication, without mention of misadventure at the time of the procedure: Secondary | ICD-10-CM | POA: Diagnosis present

## 2022-12-29 DIAGNOSIS — N10 Acute tubulo-interstitial nephritis: Principal | ICD-10-CM

## 2022-12-29 DIAGNOSIS — N136 Pyonephrosis: Secondary | ICD-10-CM | POA: Diagnosis present

## 2022-12-29 DIAGNOSIS — E785 Hyperlipidemia, unspecified: Secondary | ICD-10-CM | POA: Diagnosis present

## 2022-12-29 LAB — COMPREHENSIVE METABOLIC PANEL
ALT: 9 U/L (ref 0–44)
AST: 12 U/L — ABNORMAL LOW (ref 15–41)
Albumin: 3 g/dL — ABNORMAL LOW (ref 3.5–5.0)
Alkaline Phosphatase: 63 U/L (ref 38–126)
Anion gap: 9 (ref 5–15)
BUN: 30 mg/dL — ABNORMAL HIGH (ref 8–23)
CO2: 19 mmol/L — ABNORMAL LOW (ref 22–32)
Calcium: 7.4 mg/dL — ABNORMAL LOW (ref 8.9–10.3)
Chloride: 111 mmol/L (ref 98–111)
Creatinine, Ser: 1.97 mg/dL — ABNORMAL HIGH (ref 0.44–1.00)
GFR, Estimated: 27 mL/min — ABNORMAL LOW (ref >60)
Glucose, Bld: 110 mg/dL — ABNORMAL HIGH (ref 70–99)
Potassium: 3.4 mmol/L — ABNORMAL LOW (ref 3.5–5.1)
Sodium: 139 mmol/L (ref 135–145)
Total Bilirubin: 0.6 mg/dL (ref <1.2)
Total Protein: 6 g/dL — ABNORMAL LOW (ref 6.5–8.1)

## 2022-12-29 LAB — I-STAT CHEM 8, ED
BUN: 31 mg/dL — ABNORMAL HIGH (ref 8–23)
Calcium, Ion: 1.11 mmol/L — ABNORMAL LOW (ref 1.15–1.40)
Chloride: 106 mmol/L (ref 98–111)
Creatinine, Ser: 2.4 mg/dL — ABNORMAL HIGH (ref 0.44–1.00)
Glucose, Bld: 114 mg/dL — ABNORMAL HIGH (ref 70–99)
HCT: 34 % — ABNORMAL LOW (ref 36.0–46.0)
Hemoglobin: 11.6 g/dL — ABNORMAL LOW (ref 12.0–15.0)
Potassium: 3.8 mmol/L (ref 3.5–5.1)
Sodium: 140 mmol/L (ref 135–145)
TCO2: 22 mmol/L (ref 22–32)

## 2022-12-29 LAB — URINALYSIS, ROUTINE W REFLEX MICROSCOPIC
Bilirubin Urine: NEGATIVE
Glucose, UA: NEGATIVE mg/dL
Ketones, ur: NEGATIVE mg/dL
Nitrite: POSITIVE — AB
Protein, ur: 100 mg/dL — AB
Specific Gravity, Urine: 1.01 (ref 1.005–1.030)
pH: 8 (ref 5.0–8.0)

## 2022-12-29 LAB — CBC WITH DIFFERENTIAL/PLATELET
Abs Immature Granulocytes: 0.04 10*3/uL (ref 0.00–0.07)
Basophils Absolute: 0 10*3/uL (ref 0.0–0.1)
Basophils Relative: 0 %
Eosinophils Absolute: 0 10*3/uL (ref 0.0–0.5)
Eosinophils Relative: 0 %
HCT: 38.1 % (ref 36.0–46.0)
Hemoglobin: 13.2 g/dL (ref 12.0–15.0)
Immature Granulocytes: 0 %
Lymphocytes Relative: 5 %
Lymphs Abs: 0.5 10*3/uL — ABNORMAL LOW (ref 0.7–4.0)
MCH: 31.4 pg (ref 26.0–34.0)
MCHC: 34.6 g/dL (ref 30.0–36.0)
MCV: 90.7 fL (ref 80.0–100.0)
Monocytes Absolute: 0.4 10*3/uL (ref 0.1–1.0)
Monocytes Relative: 4 %
Neutro Abs: 10 10*3/uL — ABNORMAL HIGH (ref 1.7–7.7)
Neutrophils Relative %: 91 %
Platelets: 374 10*3/uL (ref 150–400)
RBC: 4.2 MIL/uL (ref 3.87–5.11)
RDW: 13.2 % (ref 11.5–15.5)
WBC: 11 10*3/uL — ABNORMAL HIGH (ref 4.0–10.5)
nRBC: 0 % (ref 0.0–0.2)

## 2022-12-29 MED ORDER — OXYCODONE HCL 5 MG PO TABS
5.0000 mg | ORAL_TABLET | Freq: Four times a day (QID) | ORAL | Status: DC | PRN
  Administered 2022-12-29: 5 mg via ORAL
  Filled 2022-12-29: qty 1

## 2022-12-29 MED ORDER — ACETAMINOPHEN 650 MG RE SUPP: 650.0000 mg | Freq: Four times a day (QID) | RECTAL | Status: DC | PRN

## 2022-12-29 MED ORDER — ONDANSETRON HCL 4 MG/2ML IJ SOLN: 4.0000 mg | Freq: Four times a day (QID) | INTRAMUSCULAR | Status: DC | PRN

## 2022-12-29 MED ORDER — SODIUM CHLORIDE 0.9 % IV SOLN
2.0000 g | INTRAVENOUS | Status: DC
  Administered 2022-12-29 – 2022-12-30 (×2): 2 g via INTRAVENOUS
  Filled 2022-12-29 (×2): qty 12.5

## 2022-12-29 MED ORDER — SODIUM CHLORIDE 0.9 % IV SOLN: INTRAVENOUS | Status: AC

## 2022-12-29 MED ORDER — SODIUM CHLORIDE 0.9 % IV SOLN
1.0000 g | Freq: Every day | INTRAVENOUS | Status: DC
  Administered 2022-12-29: 1 g via INTRAVENOUS
  Filled 2022-12-29: qty 10

## 2022-12-29 MED ORDER — ARIPIPRAZOLE 10 MG PO TABS
30.0000 mg | ORAL_TABLET | Freq: Every day | ORAL | Status: DC
Start: 2022-12-30 — End: 2022-12-31
  Administered 2022-12-31: 30 mg via ORAL
  Filled 2022-12-29: qty 3

## 2022-12-29 MED ORDER — SODIUM CHLORIDE 0.9 % IV BOLUS
500.0000 mL | Freq: Once | INTRAVENOUS | Status: AC
  Administered 2022-12-29: 500 mL via INTRAVENOUS

## 2022-12-29 MED ORDER — HEPARIN SODIUM (PORCINE) 5000 UNIT/ML IJ SOLN
5000.0000 [IU] | Freq: Three times a day (TID) | INTRAMUSCULAR | Status: DC
  Administered 2022-12-29 – 2022-12-31 (×5): 5000 [IU] via SUBCUTANEOUS
  Filled 2022-12-29 (×5): qty 1

## 2022-12-29 MED ORDER — HYDROMORPHONE HCL 1 MG/ML IJ SOLN
0.5000 mg | INTRAMUSCULAR | Status: AC | PRN
  Administered 2022-12-29 (×3): 0.5 mg via INTRAVENOUS
  Filled 2022-12-29: qty 1
  Filled 2022-12-29 (×2): qty 0.5

## 2022-12-29 MED ORDER — ASPIRIN 81 MG PO TBEC
81.0000 mg | DELAYED_RELEASE_TABLET | Freq: Every day | ORAL | Status: DC
  Administered 2022-12-29 – 2022-12-31 (×2): 81 mg via ORAL
  Filled 2022-12-29 (×2): qty 1

## 2022-12-29 MED ORDER — POTASSIUM CHLORIDE CRYS ER 10 MEQ PO TBCR
10.0000 meq | EXTENDED_RELEASE_TABLET | Freq: Once | ORAL | Status: AC
  Administered 2022-12-29: 10 meq via ORAL
  Filled 2022-12-29: qty 1

## 2022-12-29 MED ORDER — ATORVASTATIN CALCIUM 40 MG PO TABS
40.0000 mg | ORAL_TABLET | Freq: Every day | ORAL | Status: DC
  Administered 2022-12-29 – 2022-12-30 (×2): 40 mg via ORAL
  Filled 2022-12-29 (×2): qty 1

## 2022-12-29 MED ORDER — HYDROMORPHONE HCL 1 MG/ML IJ SOLN
0.5000 mg | Freq: Once | INTRAMUSCULAR | Status: AC
  Administered 2022-12-29: 0.5 mg via INTRAVENOUS
  Filled 2022-12-29: qty 1

## 2022-12-29 MED ORDER — ONDANSETRON HCL 4 MG PO TABS: 4.0000 mg | ORAL_TABLET | Freq: Four times a day (QID) | ORAL | Status: DC | PRN

## 2022-12-29 MED ORDER — ACETAMINOPHEN 325 MG PO TABS
650.0000 mg | ORAL_TABLET | Freq: Four times a day (QID) | ORAL | Status: DC | PRN
  Administered 2022-12-30: 650 mg via ORAL
  Filled 2022-12-29: qty 2

## 2022-12-29 MED ORDER — CALCITRIOL 0.25 MCG PO CAPS
0.2500 ug | ORAL_CAPSULE | Freq: Every day | ORAL | Status: DC
  Administered 2022-12-29 – 2022-12-31 (×2): 0.25 ug via ORAL
  Filled 2022-12-29 (×3): qty 1

## 2022-12-29 NOTE — Plan of Care (Signed)

## 2022-12-29 NOTE — ED Provider Notes (Signed)
[place] EMERGENCY DEPARTMENT AT [facility] Provider Note   CSN: [id number] Arrival date & time: 12/29/22  1000     History  Chief Complaint  Patient presents with   Nausea    Shelia Chan is a 67 y.o. female.  Patient has bladder cancer and has stents replaced every 6 weeks.  She is having left-sided abdominal pain  The history is provided by the patient and medical records. No language interpreter was used.  Abdominal Pain Pain location:  Generalized Pain quality: aching   Pain radiates to:  Does not radiate Pain severity:  Moderate Onset quality:  Sudden Timing:  Constant Progression:  Worsening Chronicity:  New Context: not alcohol use   Relieved by:  Nothing Worsened by:  Nothing Ineffective treatments:  None tried Associated symptoms: no chest pain, no cough, no diarrhea, no fatigue and no hematuria        Home Medications Prior to Admission medications   Medication Sig Start Date End Date Taking? Authorizing Provider  acetaminophen (TYLENOL) 500 MG tablet Take 500-1,000 mg by mouth every 6 (six) hours as needed for moderate pain or fever.   Yes [provider]  albuterol (VENTOLIN HFA) 108 (90 Base) MCG/ACT inhaler Inhale 1 puff into the lungs every 6 (six) hours as needed for shortness of breath. 05/22/20  Yes [provider]  ARIPiprazole (ABILIFY) 30 MG tablet Take 30 mg by mouth daily. 03/19/19  Yes [provider]  aspirin EC 81 MG tablet Take 81 mg by mouth daily.   Yes [provider]  atorvastatin (LIPITOR) 40 MG tablet Take 1 tablet daily at bedtime. 03/17/22  Yes   B Complex-C (B-COMPLEX WITH VITAMIN C) tablet Take 1 tablet by mouth daily.   Yes [provider]  calcitRIOL (ROCALTROL) 0.25 MCG capsule Take 0.25 mcg by mouth daily. 02/12/21  Yes [provider]  denosumab (PROLIA) 60 MG/ML SOSY injection Inject 60 mg [place] every 6 months 05/23/22  Yes   ferrous sulfate 324 MG TBEC Take  324 mg by mouth daily with breakfast.   Yes [provider]  furosemide (LASIX) 40 MG tablet Take 40 mg by mouth daily. 10/13/22  Yes [provider]  oxyCODONE (OXY IR/ROXICODONE) 5 MG immediate release tablet Take 1 tablet (5 mg total) by mouth every 6 (six) hours as needed for severe pain 12/02/22  Yes   pantoprazole (PROTONIX) 40 MG tablet Take 40 mg by mouth daily as needed (acid reflux). 01/28/21  Yes [provider]  potassium chloride (KLOR-CON) 20 MEQ packet Take 20 mEq by mouth daily as needed (when blood results indicate low potassium).   Yes [provider]  vitamin C (ASCORBIC ACID) 500 MG tablet Take 500 mg by mouth daily.   Yes [provider]  busPIRone (BUSPAR) 7.5 MG tablet Take 7.5 mg by mouth 2 (two) times daily as needed (anxiety). Patient not taking: Reported on 12/29/2022 02/22/21   [provider]  Cholecalciferol (VITAMIN D-3) 25 MCG (1000 UT) CAPS Take 2,000 Units by mouth daily. Patient not taking: Reported on 12/29/2022    [provider]  diltiazem (CARDIZEM CD) 120 MG 24 hr capsule Take 120 mg by mouth daily. Patient not taking: Reported on 12/29/2022 12/03/22   [provider]  DULoxetine (CYMBALTA) 30 MG capsule Take 30 mg by mouth at bedtime. Patient not taking: Reported on 12/29/2022 06/21/20   [provider]      Allergies    Metoprolol and  Shellfish-derived products    Review of Systems   Review of Systems  Constitutional:  Negative for appetite change and fatigue.  HENT:  Negative for congestion, ear discharge and sinus pressure.   Eyes:  Negative for discharge.  Respiratory:  Negative for cough.   Cardiovascular:  Negative for chest pain.  Gastrointestinal:  Positive for abdominal pain. Negative for diarrhea.  Genitourinary:  Negative for frequency and hematuria.  Musculoskeletal:  Negative for back pain.  Skin:  Negative for rash.  Neurological:  Negative for seizures and  headaches.  Psychiatric/Behavioral:  Negative for hallucinations.     Physical Exam Updated Vital Signs BP 114/66 (BP Location: Right Arm)   Pulse 65   Temp 98.7 F (37.1 C)   Resp 18   Ht 5\' 1"  (1.549 m)   Wt 57.6 kg   SpO2 98%   BMI 24.00 kg/m  Physical Exam Vitals and nursing note reviewed.  Constitutional:      Appearance: She is well-developed.  HENT:     Head: Normocephalic.     Mouth/Throat:     Mouth: Mucous membranes are moist.  Eyes:     General: No scleral icterus.    Conjunctiva/sclera: Conjunctivae normal.  Neck:     Thyroid: No thyromegaly.  Cardiovascular:     Rate and Rhythm: Normal rate and regular rhythm.     Heart sounds: No murmur heard.    No friction rub. No gallop.  Pulmonary:     Breath sounds: No stridor. No wheezing or rales.  Chest:     Chest wall: No tenderness.  Abdominal:     General: There is no distension.     Tenderness: There is abdominal tenderness. There is no rebound.  Musculoskeletal:        General: Normal range of motion.     Cervical back: Neck supple.  Lymphadenopathy:     Cervical: No cervical adenopathy.  Skin:    Findings: No erythema or rash.  Neurological:     Mental Status: She is alert and oriented to person, place, and time.     Motor: No abnormal muscle tone.     Coordination: Coordination normal.  Psychiatric:        Behavior: Behavior normal.     ED Results / Procedures / Treatments   Labs (all labs ordered are listed, but only abnormal results are displayed) Labs Reviewed  CBC WITH DIFFERENTIAL/PLATELET - Abnormal; Notable for the following components:      Result Value   WBC 11.0 (*)    Neutro Abs 10.0 (*)    Lymphs Abs 0.5 (*)    All other components within normal limits  URINALYSIS, ROUTINE W REFLEX MICROSCOPIC - Abnormal; Notable for the following components:   APPearance HAZY (*)    Hgb urine dipstick SMALL (*)    Protein, ur 100 (*)    Nitrite POSITIVE (*)    Leukocytes,Ua LARGE (*)     Bacteria, UA RARE (*)    All other components within normal limits  COMPREHENSIVE METABOLIC PANEL - Abnormal; Notable for the following components:   Potassium 3.4 (*)    CO2 19 (*)    Glucose, Bld 110 (*)    BUN 30 (*)    Creatinine, Ser 1.97 (*)    Calcium 7.4 (*)    Total Protein 6.0 (*)    Albumin 3.0 (*)    AST 12 (*)    GFR, Estimated 27 (*)    All other components within normal  limits  I-STAT CHEM 8, ED - Abnormal; Notable for the following components:   BUN 31 (*)    Creatinine, Ser 2.40 (*)    Glucose, Bld 114 (*)    Calcium, Ion 1.11 (*)    Hemoglobin 11.6 (*)    HCT 34.0 (*)    All other components within normal limits    EKG None  Radiology CT Renal Stone Study Result Date: 12/29/2022 CLINICAL DATA:  Left-sided pain associated with nausea and vomiting. History of bladder cancer status post stent placement. EXAM: CT ABDOMEN AND PELVIS WITHOUT CONTRAST TECHNIQUE: Multidetector CT imaging of the abdomen and pelvis was performed following the standard protocol without IV contrast. RADIATION DOSE REDUCTION: This exam was performed according to the departmental dose-optimization program which includes automated exposure control, adjustment of the mA and/or kV according to patient size and/or use of iterative reconstruction technique. COMPARISON:  CT abdomen and pelvis dated 07/24/2022 FINDINGS: Lower chest: No focal consolidation or pulmonary nodule in the lung bases. No pleural effusion or pneumothorax demonstrated. Partially imaged heart size is normal. Hepatobiliary: No focal hepatic lesions. No intra or extrahepatic biliary ductal dilation. Normal gallbladder. Pancreas: No focal lesions or main ductal dilation. Spleen: Normal in size without focal abnormality. Adrenals/Urinary Tract: No adrenal nodules. Bilateral percutaneous nephroureteral catheters in-situ with proximal pigtails in the renal pelvises. Interval development of severe left hydroureteronephrosis and moderate  left perinephric stranding. Chronic left renal atrophy. No suspicious renal masses by noncontrast technique. No calculi. Prior cystectomy and ileal conduit. Stomach/Bowel: Normal appearance of the stomach. Right lower quadrant ileal conduit. No evidence of bowel wall thickening, distention, or inflammatory changes. Appendix is not discretely seen. Vascular/Lymphatic: Aortic atherosclerosis. No enlarged abdominal or pelvic lymph nodes. Reproductive: No adnexal masses. Other: Small volume left perinephric free fluid. No free air or fluid collection. Postsurgical changes of the pelvis with multiple bilateral surgical clips along the pelvic sidewalls. Musculoskeletal: Similar bilateral sacroiliac sclerosis. Similar irregularity and fragmentation of the pubic symphysis. Partially imaged postsurgical changes of left femoral proximal fixation. Multilevel degenerative changes of the partially imaged thoracic and lumbar spine. Postsurgical changes of the anterior abdominal wall. IMPRESSION: 1. Interval development of severe left hydroureteronephrosis and moderate left perinephric stranding. Bilateral percutaneous nephroureteral catheters in-situ with proximal pigtails in the renal pelvises. 2. 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 3.  Aortic Atherosclerosis (ICD10-I70.0). Electronically Signed   By: Agustin Cree M.D.   On: 12/29/2022 11:30    Procedures Procedures    Medications Ordered in ED Medications  cefTRIAXone (ROCEPHIN) 1 g in sodium chloride 0.9 % 100 mL IVPB (has no administration in time range)  oxyCODONE (Oxy IR/ROXICODONE) immediate release tablet 5 mg (has no administration in time range)  heparin injection 5,000 Units (has no administration in time range)  acetaminophen (TYLENOL) tablet 650 mg (has no administration in time range)    Or  acetaminophen (TYLENOL) suppository 650 mg (has no administration in time range)  HYDROmorphone  (DILAUDID) injection 0.5 mg (has no administration in time range)  ondansetron (ZOFRAN) tablet 4 mg (has no administration in time range)    Or  ondansetron (ZOFRAN) injection 4 mg (has no administration in time range)  sodium chloride 0.9 % bolus 500 mL (0 mLs Intravenous Stopped 12/29/22 1248)  HYDROmorphone (DILAUDID) injection 0.5 mg (0.5 mg Intravenous Given 12/29/22 1216)    ED Course/ Medical Decision Making/ A&P  I spoke with urology and they recommended admission to medicine for UTI and  having IR replace the stents tomorrow                               Medical Decision Making Amount and/or Complexity of Data Reviewed Labs: ordered. Radiology: ordered.  Risk Prescription drug management. Decision regarding hospitalization.  This patient presents to the ED for concern of abdominal pain, this involves an extensive number of treatment options, and is a complaint that carries with it a high risk of complications and morbidity.  The differential diagnosis includes bowel obstruction, ureteral stent malfunction   Co morbidities that complicate the patient evaluation  Bladder cancer with 2 ureteral stents   Additional history obtained:  Additional history obtained from patient External records from outside source obtained and reviewed including hospital records Records  Lab Tests:  I Ordered, and personally interpreted labs.  The pertinent results include: UA appears infected   Imaging Studies ordered:  I ordered imaging studies including CT abdomen I independently visualized and interpreted imaging which showed hydro nephrosis I agree with the radiologist interpretation   Cardiac Monitoring: / EKG:  The patient was maintained on a cardiac monitor.  I personally viewed and interpreted the cardiac monitored which showed an underlying rhythm of: Normal sinus rhythm   Consultations Obtained:  I requested consultation with the urology and hospitalist,  and discussed  lab and imaging findings as well as pertinent plan - they recommend: Hospitalist will admit with our replacing stents   Problem List / ED Course / Critical interventions / Medication management  Bladder cancer and abdominal pain I ordered medication including Dilaudid for pain Reevaluation of the patient after these medicines showed that the patient improved I have reviewed the patients home medicines and have made adjustments as needed   Social Determinants of Health:  None   Test / Admission - Considered:  None  Urinary tract infection with malfunctioning ureteral stent        Final Clinical Impression(s) / ED Diagnoses Final diagnoses:  None    Rx / DC Orders ED Discharge Orders     None         Bethann Berkshire, MD 01/02/23 1658

## 2022-12-29 NOTE — ED Triage Notes (Signed)
The pt is [facility]. The patient reports left sided pain, and n/v, has a PMHx of Bladder CA remission, multiple stents placed in kidney. She reports the stents get clogged and this is when the symptoms start. She reports it started yesterday. A&Ox4. VS B/P 142/76, CBG 122, P 84, O2 Sats 100%RA.

## 2022-12-29 NOTE — H&P (Signed)
History and Physical    Patient: Shelia Chan:[id number] DOB: [birth year] DOA: 12/29/2022 DOS: the patient was seen and examined on 12/29/2022 PCP: Ellyn Hack, MD  Patient coming from: Home  Chief Complaint:  Chief Complaint  Patient presents with   Nausea   HPI: Shelia Chan is a 67 y.o. female with medical history significant of osteoarthritis, asthma, CHF, COPD, heart murmur, unspecified immunodeficiency disorder, CKD, history of bladder cancer and ileal conduit, multiple nephrostomy tubes placement who presented to the emergency department complaints of left-sided flank pain, nausea and emesis.  No constipation, melena or hematochezia.  She has also been having diarrhea for the past 3 days.  She stated that she feels like her left nephrostomy has clogged.  She denied fever, rhinorrhea, sore throat, wheezing or hemoptysis.  No chest pain, palpitations, diaphoresis, PND, orthopnea or pitting edema of the lower extremities.  No polyuria, polydipsia, polyphagia or blurred vision.   Lab work: Urinalysis with small hemoglobin, proteinuria 100 mg deciliter, positive nitrates and large leukocyte esterase.  There were rare bacteria microscopic examination.  CBC showed a white count 11.0 with 91% neutrophils, hemoglobin 13.2 g/dL platelets 811.  CMP showed potassium 3.4 and CO2 of 19 mmol/L with an anion gap of 9.  Glucose 110, BUN 30, creatinine 1.97 and corrected calcium about 8.4 mg/deciliter.  Total protein 6.0 and albumin 3.0 g/dL.  Rest of the LFTs were unremarkable.  Imaging: CT renal study show interval development of severe left hydroureteronephrosis and moderate left perinephric stranding.  Bilateral percutaneous nephroureteral catheters in situ with proximal pigtail seen the renal pelvises.  Similar chronic findings of bilateral sacroiliac sclerosis with irregularity and fragmentation of the pubic symphysis.  Aortic atherosclerosis.   ED course: Initial vital  signs were temperature 98.5 F, pulse 64, respiration 18, BP 125 to 75 mmHg O2 sat 99% on room air.  The patient received NS 500 mL bolus, hydromorphone 0.5 mg IVP and ceftriaxone 1 g IVPB.  Review of Systems: As mentioned in the history of present illness. All other systems reviewed and are negative. Past Medical History:  Diagnosis Date   Arthritis    Asthma    Blood transfusion without reported diagnosis    Cancer (HCC)    CHF (congestive heart failure) (HCC)    COPD (chronic obstructive pulmonary disease) (HCC)    Heart murmur    Immune deficiency disorder (HCC)    Renal disorder    Past Surgical History:  Procedure Laterality Date   BLADDER REMOVAL  118/22/2015   FRACTURE SURGERY Left 02/2018   pins   IR CATHETER TUBE CHANGE  04/15/2019   IR CATHETER TUBE CHANGE  04/15/2019   IR EXT NEPHROURETERAL CATH EXCHANGE  07/15/2019   IR EXT NEPHROURETERAL CATH EXCHANGE  10/14/2019   IR EXT NEPHROURETERAL CATH EXCHANGE  10/14/2019   IR EXT NEPHROURETERAL CATH EXCHANGE  01/06/2020   IR EXT NEPHROURETERAL CATH EXCHANGE  03/16/2020   IR EXT NEPHROURETERAL CATH EXCHANGE  05/25/2020   IR EXT NEPHROURETERAL CATH EXCHANGE  07/17/2020   IR EXT NEPHROURETERAL CATH EXCHANGE  07/17/2020   IR EXT NEPHROURETERAL CATH EXCHANGE  08/14/2020   IR EXT NEPHROURETERAL CATH EXCHANGE  09/25/2020   IR EXT NEPHROURETERAL CATH EXCHANGE  11/06/2020   IR EXT NEPHROURETERAL CATH EXCHANGE  12/25/2020   IR EXT NEPHROURETERAL CATH EXCHANGE  02/09/2021   IR EXT NEPHROURETERAL CATH EXCHANGE  03/19/2021   IR EXT NEPHROURETERAL CATH EXCHANGE  03/19/2021   IR EXT NEPHROURETERAL  CATH EXCHANGE  04/23/2021   IR EXT NEPHROURETERAL CATH EXCHANGE  04/23/2021   IR EXT NEPHROURETERAL CATH EXCHANGE  06/04/2021   IR EXT NEPHROURETERAL CATH EXCHANGE  07/15/2021   IR EXT NEPHROURETERAL CATH EXCHANGE  09/02/2021   IR EXT NEPHROURETERAL CATH EXCHANGE  10/14/2021   IR EXT NEPHROURETERAL CATH EXCHANGE  11/25/2021   IR EXT NEPHROURETERAL CATH EXCHANGE   01/06/2022   IR EXT NEPHROURETERAL CATH EXCHANGE  02/17/2022   IR EXT NEPHROURETERAL CATH EXCHANGE  03/31/2022   IR EXT NEPHROURETERAL CATH EXCHANGE  05/12/2022   IR EXT NEPHROURETERAL CATH EXCHANGE  06/30/2022   IR EXT NEPHROURETERAL CATH EXCHANGE  08/18/2022   IR EXT NEPHROURETERAL CATH EXCHANGE  10/06/2022   IR EXT NEPHROURETERAL CATH EXCHANGE  11/24/2022   IR PERC TUN PERIT CATH WO PORT S&I /IMAG  03/25/2021   IR REMOVAL TUN CV CATH W/O FL  06/04/2021   Social History:  reports that she has been smoking cigarettes and e-cigarettes. She started smoking about 53 years ago. She has a 50 pack-year smoking history. She has never used smokeless tobacco. She reports that she does not currently use alcohol. She reports current drug use. Drug: Marijuana.  Allergies  Allergen Reactions   Metoprolol Shortness Of Breath   Shellfish-Derived Products Other (See Comments)    Family History  Problem Relation Age of Onset   Heart disease Maternal Grandfather     Prior to Admission medications   Medication Sig Start Date End Date Taking? Authorizing Provider  acetaminophen (TYLENOL) 500 MG tablet Take 500-1,000 mg by mouth every 6 (six) hours as needed for moderate pain or fever.    [provider]  albuterol (VENTOLIN HFA) 108 (90 Base) MCG/ACT inhaler Inhale 1 puff into the lungs every 6 (six) hours as needed for shortness of breath. 05/22/20   [provider]  ARIPiprazole (ABILIFY) 30 MG tablet Take 30 mg by mouth daily. 03/19/19   [provider]  aspirin EC 81 MG tablet Take 81 mg by mouth daily.    [provider]  atorvastatin (LIPITOR) 40 MG tablet Take 1 tablet daily at bedtime. 03/17/22     B Complex-C (B-COMPLEX WITH VITAMIN C) tablet Take 1 tablet by mouth daily.    [provider]  busPIRone (BUSPAR) 7.5 MG tablet Take 7.5 mg by mouth 2 (two) times daily as needed (anxiety). 02/22/21   [provider]  calcitRIOL (ROCALTROL) 0.25 MCG capsule Take  0.25 mcg by mouth daily. 02/12/21   [provider]  cefdinir (OMNICEF) 300 MG capsule Take 1 capsule (300 mg total) by mouth 2 (two) times daily for 10 days- until gone, treating UTI 08/22/22     Cholecalciferol (VITAMIN D-3) 25 MCG (1000 UT) CAPS Take 2,000 Units by mouth daily.    [provider]  denosumab (PROLIA) 60 MG/ML SOSY injection Inject 60 mg [place] every 6 months 05/23/22     DULoxetine (CYMBALTA) 30 MG capsule Take 30 mg by mouth at bedtime. 06/21/20   [provider]  ferrous sulfate 324 MG TBEC Take 324 mg by mouth daily with breakfast.    [provider]  oxyCODONE (OXY IR/ROXICODONE) 5 MG immediate release tablet Take 1 tablet (5 mg) by mouth every 6 hours as needed for severe pain. 07/28/22     oxyCODONE (OXY IR/ROXICODONE) 5 MG immediate release tablet Take 1 tablet (5 mg total) by mouth every 6 (six) hours as needed for severe pain. 08/26/22     oxyCODONE (  OXY IR/ROXICODONE) 5 MG immediate release tablet Take 1 tablet (5 mg total) by mouth every 6 (six) hours as needed for pain 09/30/22     oxyCODONE (OXY IR/ROXICODONE) 5 MG immediate release tablet Take 1 tablet (5 mg total) by mouth every 6 (six) hours as needed for severe pain. 11/01/22     oxyCODONE (OXY IR/ROXICODONE) 5 MG immediate release tablet Take 1 tablet (5 mg total) by mouth every 6 (six) hours as needed for severe pain 12/02/22     pantoprazole (PROTONIX) 40 MG tablet Take 40 mg by mouth daily. 01/28/21   [provider]  potassium chloride (KLOR-CON) 20 MEQ packet Take 20 mEq by mouth daily.    [provider]  sodium bicarbonate 650 MG tablet Take 1 tablet (650 mg total) by mouth 3 (three) times daily. 07/28/22   Glade Lloyd, MD  vitamin C (ASCORBIC ACID) 500 MG tablet Take 500 mg by mouth daily.    [provider]    Physical Exam: Vitals:   12/29/22 1013 12/29/22 1018 12/29/22 1124 12/29/22 1545  BP:  125/75 (!) 144/73 100/64  Pulse:  64 61 62  Resp:  18 18  16   Temp:  98.5 F (36.9 C) 98 F (36.7 C) 98 F (36.7 C)  TempSrc:   Oral Oral  SpO2:  99% 99% 99%  Weight: 57.6 kg     Height: 5\' 1"  (1.549 m)      Physical Exam Vitals and nursing note reviewed.  Constitutional:      General: She is awake. She is not in acute distress.    Appearance: She is normal weight. She is ill-appearing.  HENT:     Head: Normocephalic.     Nose: No rhinorrhea.     Mouth/Throat:     Mouth: Mucous membranes are moist.  Eyes:     General: No scleral icterus.    Pupils: Pupils are equal, round, and reactive to light.  Neck:     Vascular: No JVD.  Cardiovascular:     Rate and Rhythm: Normal rate and regular rhythm.     Heart sounds: S1 normal and S2 normal.  Pulmonary:     Effort: Pulmonary effort is normal.     Breath sounds: Normal breath sounds. No wheezing, rhonchi or rales.  Abdominal:     General: The ostomy site is clean. There is no distension.     Palpations: Abdomen is soft.     Tenderness: There is abdominal tenderness in the left upper quadrant. There is left CVA tenderness. There is no right CVA tenderness.  Musculoskeletal:     Cervical back: Neck supple.     Right lower leg: No edema.     Left lower leg: No edema.  Skin:    General: Skin is warm and dry.  Neurological:     General: No focal deficit present.     Mental Status: She is alert and oriented to person, place, and time.  Psychiatric:        Mood and Affect: Mood normal.        Behavior: Behavior normal. Behavior is cooperative.     Data Reviewed:  Results are pending, will review when available. 03/18/2021 echocardiogram report IMPRESSIONS:   1. Left ventricular ejection fraction, by estimation, is 65 to 70%. Left  ventricular ejection fraction by 2D MOD biplane is 68.2 %. The left  ventricle has normal function. The left ventricle has no regional wall  motion abnormalities. There is mild left  ventricular hypertrophy. Left ventricular diastolic parameters are   consistent with Grade I diastolic dysfunction (impaired relaxation).   2. Right ventricular systolic function is normal. The right ventricular  size is normal. There is normal pulmonary artery systolic pressure. The  estimated right ventricular systolic pressure is 34.2 mmHg.   3. The mitral valve is grossly normal. Trivial mitral valve  regurgitation.   4. The aortic valve is abnormal. There is moderate thickening of the  aortic valve. Aortic valve regurgitation is mild to moderate.   5. The inferior vena cava is dilated in size with <50% respiratory  variability, suggesting right atrial pressure of 15 mmHg.   Assessment and Plan: Principal Problem:   Complicated UTI (urinary tract infection)   UTI (urinary tract infection) due to urinary indwelling catheter (HCC) Inpatient/MedSurg. Continue IV fluids. Analgesics as needed. Antiemetics as needed. Begin cefepime 2 g IVPB every 8 hours. Follow CBC, CMP in AM.  Active Problems:   CKD (chronic kidney disease), stage IV (HCC) Monitor renal function electrolytes. Continue calcitriol 0.25 mcg p.o. daily.    Hypokalemia Replaced. Follow-up potassium level in the morning.    Asthma, chronic, unspecified asthma severity, with acute exacerbation   COPD (chronic obstructive pulmonary disease) (HCC) Bronchodilators as needed.    Chronic diastolic CHF (congestive heart failure) (HCC) Not fluid overloaded. Follow-up with PCP and/or cardiology as OP.    Depression with anxiety Continue Abilify 30 mg p.o. daily.    Hyperlipidemia  Continue atorvastatin 40 mg p.o. daily.    Advance Care Planning:   Code Status: Full Code   Consults: Interventional radiology (Dr. Lowella Dandy). EDP spoke to Dr. Annabell Howells who will notify Dr. Arita Miss (primary urologist).  Family Communication:   Severity of Illness: The appropriate patient status for this patient is INPATIENT. Inpatient status is judged to be reasonable and necessary in order to provide the  required intensity of service to ensure the patient's safety. The patient's presenting symptoms, physical exam findings, and initial radiographic and laboratory data in the context of their chronic comorbidities is felt to place them at high risk for further clinical deterioration. Furthermore, it is not anticipated that the patient will be medically stable for discharge from the hospital within 2 midnights of admission.   * I certify that at the point of admission it is my clinical judgment that the patient will require inpatient hospital care spanning beyond 2 midnights from the point of admission due to high intensity of service, high risk for further deterioration and high frequency of surveillance required.*  Author: Bobette Mo, MD 12/29/2022 4:21 PM  For on call review [email or web address].   This document was prepared using Dragon voice recognition software and may contain some unintended transcription errors.

## 2022-12-30 ENCOUNTER — Other Ambulatory Visit: Payer: Self-pay

## 2022-12-30 ENCOUNTER — Inpatient Hospital Stay (HOSPITAL_COMMUNITY): Payer: 59

## 2022-12-30 DIAGNOSIS — E876 Hypokalemia: Secondary | ICD-10-CM | POA: Diagnosis not present

## 2022-12-30 DIAGNOSIS — N184 Chronic kidney disease, stage 4 (severe): Secondary | ICD-10-CM

## 2022-12-30 DIAGNOSIS — J45901 Unspecified asthma with (acute) exacerbation: Secondary | ICD-10-CM

## 2022-12-30 DIAGNOSIS — I5032 Chronic diastolic (congestive) heart failure: Secondary | ICD-10-CM | POA: Diagnosis not present

## 2022-12-30 DIAGNOSIS — T83511A Infection and inflammatory reaction due to indwelling urethral catheter, initial encounter: Secondary | ICD-10-CM

## 2022-12-30 DIAGNOSIS — J449 Chronic obstructive pulmonary disease, unspecified: Secondary | ICD-10-CM

## 2022-12-30 DIAGNOSIS — N390 Urinary tract infection, site not specified: Secondary | ICD-10-CM | POA: Diagnosis not present

## 2022-12-30 HISTORY — PX: IR NEPHROSTOMY EXCHANGE RIGHT: IMG6070

## 2022-12-30 HISTORY — PX: IR NEPHROSTOMY EXCHANGE LEFT: IMG6069

## 2022-12-30 LAB — COMPREHENSIVE METABOLIC PANEL
ALT: 8 U/L (ref 0–44)
AST: 11 U/L — ABNORMAL LOW (ref 15–41)
Albumin: 2.9 g/dL — ABNORMAL LOW (ref 3.5–5.0)
Alkaline Phosphatase: 63 U/L (ref 38–126)
Anion gap: 10 (ref 5–15)
BUN: 33 mg/dL — ABNORMAL HIGH (ref 8–23)
CO2: 19 mmol/L — ABNORMAL LOW (ref 22–32)
Calcium: 7.4 mg/dL — ABNORMAL LOW (ref 8.9–10.3)
Chloride: 106 mmol/L (ref 98–111)
Creatinine, Ser: 2.11 mg/dL — ABNORMAL HIGH (ref 0.44–1.00)
GFR, Estimated: 25 mL/min — ABNORMAL LOW (ref >60)
Glucose, Bld: 99 mg/dL (ref 70–99)
Potassium: 3.5 mmol/L (ref 3.5–5.1)
Sodium: 135 mmol/L (ref 135–145)
Total Bilirubin: 0.5 mg/dL (ref <1.2)
Total Protein: 5.8 g/dL — ABNORMAL LOW (ref 6.5–8.1)

## 2022-12-30 LAB — CBC
HCT: 29.5 % — ABNORMAL LOW (ref 36.0–46.0)
Hemoglobin: 10 g/dL — ABNORMAL LOW (ref 12.0–15.0)
MCH: 31.4 pg (ref 26.0–34.0)
MCHC: 33.9 g/dL (ref 30.0–36.0)
MCV: 92.8 fL (ref 80.0–100.0)
Platelets: 280 10*3/uL (ref 150–400)
RBC: 3.18 MIL/uL — ABNORMAL LOW (ref 3.87–5.11)
RDW: 13.3 % (ref 11.5–15.5)
WBC: 9.4 10*3/uL (ref 4.0–10.5)
nRBC: 0 % (ref 0.0–0.2)

## 2022-12-30 MED ORDER — HYDROMORPHONE HCL 1 MG/ML IJ SOLN
0.5000 mg | INTRAMUSCULAR | Status: AC | PRN
  Administered 2022-12-30: 0.5 mg via INTRAVENOUS
  Filled 2022-12-30: qty 0.5

## 2022-12-30 MED ORDER — MORPHINE SULFATE (PF) 2 MG/ML IV SOLN
2.0000 mg | INTRAVENOUS | Status: DC | PRN
  Administered 2022-12-30: 2 mg via INTRAVENOUS
  Filled 2022-12-30: qty 1

## 2022-12-30 MED ORDER — IOHEXOL 300 MG/ML  SOLN
50.0000 mL | Freq: Once | INTRAMUSCULAR | Status: AC | PRN
  Administered 2022-12-30: 20 mL

## 2022-12-30 MED ORDER — HYDROMORPHONE HCL 1 MG/ML IJ SOLN: 0.5000 mg | INTRAMUSCULAR | Status: DC | PRN

## 2022-12-30 MED ORDER — HYDROCODONE-ACETAMINOPHEN 5-325 MG PO TABS
1.0000 | ORAL_TABLET | ORAL | Status: DC | PRN
Start: 2022-12-30 — End: 2022-12-30

## 2022-12-30 NOTE — Progress Notes (Signed)
Pt going down with  IR    pt calm and resting in bed

## 2022-12-30 NOTE — Plan of Care (Signed)

## 2022-12-30 NOTE — Hospital Course (Signed)
Shelia Chan is a 67 y.o. female with medical history significant of osteoarthritis, asthma, CHF, COPD,  unspecified immunodeficiency disorder, CKD, history of bladder cancer and ileal conduit, multiple nephrostomy tubes placement presented to hospital with left flank pain nausea and vomiting with diarrhea.  Denied fevers.  In the ED vitals were stable.  Urine analysis showed positive nitrate and large leukocyte esterase.  BMP showed mild hypokalemia with potassium of 3.4.  CO2 was 19.  Creatinine was elevated at 1.9. CT renal study show interval development of severe left hydroureteronephrosis and moderate left perinephric stranding.  Bilateral percutaneous nephroureteral catheters in situ with proximal pigtail seen the renal pelvises.  S in the ED patient received patient received NS 500 mL bolus, hydromorphone 0.5 mg IVP and ceftriaxone 1 g IVPB and was considered for admission to the hospital..  Assessment and Plan:    Complicated UTI (urinary tract infection) Secondary chronic indwelling catheter.  Continue cefepime, follow urine cultures.  Supportive care.   CT renal was study showed interval development of severe left hydroureteronephrosis with perinephric stranding.Plan for percutaneous nephrostomy tube changes by interventional radiology.      CKD (chronic kidney disease), stage IV On calcitriol.  Continue to monitor renal function.  Latest creatinine at 2.4.     Hypokalemia Replenished.  Potassium today at 3.5     Asthma, chronic, unspecified asthma severity, with acute exacerbation   COPD (chronic obstructive pulmonary disease) (HCC) Continue oxygen nebulizers.     Chronic diastolic CHF (congestive heart failure) (HCC) Currently compensated     Depression with anxiety Continue for 5     Hyperlipidemia  Continue Lipitor

## 2022-12-30 NOTE — Progress Notes (Addendum)
PROGRESS NOTE    Shelia Chan  ZOX:[id number] DOB: [birth year] DOA: 12/29/2022 PCP: Ellyn Hack, MD    Brief Narrative:   Shelia Chan is a 67 y.o. female with medical history significant of osteoarthritis, asthma, CHF, COPD,  unspecified immunodeficiency disorder, CKD, history of bladder cancer and ileal conduit, multiple nephrostomy tubes placement presented to hospital with left flank pain nausea and vomiting with diarrhea.  Denied fevers.  In the ED vitals were stable.  Urine analysis showed positive nitrate and large leukocyte esterase.  BMP showed mild hypokalemia with potassium of 3.4.  CO2 was 19.  Creatinine was elevated at 1.9. CT renal study show interval development of severe left hydroureteronephrosis and moderate left perinephric stranding.  Bilateral percutaneous nephroureteral catheters in situ with proximal pigtail seen the renal pelvises.  S in the ED patient received patient received NS 500 mL bolus, hydromorphone 0.5 mg IVP and ceftriaxone 1 g IVPB and was considered for admission to the hospital..  Assessment and Plan:    Complicated UTI (urinary tract infection) Secondary chronic indwelling catheter.  Continue cefepime, follow urine cultures.  Supportive care.   CT renal was study showed interval development of severe left hydroureteronephrosis with perinephric stranding.Plan for percutaneous nephrostomy tube changes by interventional radiology.  Complains of intractable pain.  Currently NPO.  Will add IV morphine for pain management.     CKD (chronic kidney disease), stage IV On calcitriol.  Continue to monitor renal function.  Latest creatinine at 2.1 today from 2.4.  Likely at baseline at this time.  Check BMP in AM.     Hypokalemia Replenished.  Potassium today at 3.5     Asthma, chronic, unspecified asthma severity, with acute exacerbation   COPD (chronic obstructive pulmonary disease) (HCC) Continue oxygen nebulizers.     Chronic  diastolic CHF (congestive heart failure) (HCC) Currently compensated     Depression with anxiety Continue for 5     Hyperlipidemia  Continue Lipitor     DVT prophylaxis: heparin injection 5,000 Units Start: 12/29/22 2200   Code Status:     Code Status: Full Code  Disposition: Home likely in 1 to 2 days.  Status is: Inpatient  Remains inpatient appropriate because: Complicated UTI need for nephrostomy tube changes, intractable pain, IV antibiotic,   Family Communication: None at bedside  Consultants:  Interventional radiology  Procedures:  None yet  Antimicrobials:  Cefepime  Anti-infectives (From admission, onward)    Start     Dose/Rate Route Frequency Ordered Stop   12/29/22 2030  ceFEPIme (MAXIPIME) 2 g in sodium chloride 0.9 % 100 mL IVPB       Note to Pharmacy: Please adjust dose to as needed.   2 g 200 mL/hr over 30 Minutes Intravenous Every 24 hours 12/29/22 1911     12/29/22 1630  cefTRIAXone (ROCEPHIN) 1 g in sodium chloride 0.9 % 100 mL IVPB  Status:  Discontinued        1 g 200 mL/hr over 30 Minutes Intravenous Daily 12/29/22 1612 12/29/22 1911        Subjective: Today, patient was seen and examined at bedside.  Complains of left loin pain.  Denies any hematuria.  No nausea vomiting fever or chills.  Objective: Vitals:   12/29/22 1630 12/29/22 2248 12/30/22 0242 12/30/22 0553  BP: 114/66 (!) 93/59 (!) 100/52 (!) 95/59  Pulse: 65 79 76 70  Resp: 18 16 16 16   Temp: 98.7 F (37.1 C) 98.8 F (37.1 C) 99.1  F (37.3 C) 99.8 F (37.7 C)  TempSrc:  Oral Oral Oral  SpO2: 98% 97% 96% 95%  Weight:      Height:       No intake or output data in the 24 hours ending 12/30/22 1004 Filed Weights   12/29/22 1013  Weight: 57.6 kg    Physical Examination: Body mass index is 24 kg/m.  General:  Average built, mild distress due to pain, appears older than stated age, appears chronically ill. HENT:   No scleral pallor or icterus noted. Oral mucosa is  moist.  Chest:  Clear breath sounds. No crackles or wheezes.  CVS: S1 &S2 heard. No murmur.  Regular rate and rhythm. Abdomen: Soft, nontender, nondistended.  Bowel sounds are heard.  Lateral costovertebral angle tenderness noted more on the left. Extremities: No cyanosis, clubbing or edema.  Peripheral pulses are palpable. Psych: Alert, awake and oriented, normal mood CNS:  No cranial nerve deficits.  Power equal in all extremities.   Skin: Warm and dry.  No rashes noted.  Data Reviewed:   CBC: Recent Labs  Lab 12/29/22 1213 12/29/22 1518 12/30/22 0917  WBC 11.0*  --  9.4  NEUTROABS 10.0*  --   --   HGB 13.2 11.6* 10.0*  HCT 38.1 34.0* 29.5*  MCV 90.7  --  92.8  PLT 374  --  280    Basic Metabolic Panel: Recent Labs  Lab 12/29/22 1430 12/29/22 1518 12/30/22 0556  NA 139 140 135  K 3.4* 3.8 3.5  CL 111 106 106  CO2 19*  --  19*  GLUCOSE 110* 114* 99  BUN 30* 31* 33*  CREATININE 1.97* 2.40* 2.11*  CALCIUM 7.4*  --  7.4*    Liver Function Tests: Recent Labs  Lab 12/29/22 1430 12/30/22 0556  AST 12* 11*  ALT 9 8  ALKPHOS 63 63  BILITOT 0.6 0.5  PROT 6.0* 5.8*  ALBUMIN 3.0* 2.9*     Radiology Studies: CT Renal Stone Study Result Date: 12/29/2022 CLINICAL DATA:  Left-sided pain associated with nausea and vomiting. History of bladder cancer status post stent placement. EXAM: CT ABDOMEN AND PELVIS WITHOUT CONTRAST TECHNIQUE: Multidetector CT imaging of the abdomen and pelvis was performed following the standard protocol without IV contrast. RADIATION DOSE REDUCTION: This exam was performed according to the departmental dose-optimization program which includes automated exposure control, adjustment of the mA and/or kV according to patient size and/or use of iterative reconstruction technique. COMPARISON:  CT abdomen and pelvis dated 07/24/2022 FINDINGS: Lower chest: No focal consolidation or pulmonary nodule in the lung bases. No pleural effusion or pneumothorax  demonstrated. Partially imaged heart size is normal. Hepatobiliary: No focal hepatic lesions. No intra or extrahepatic biliary ductal dilation. Normal gallbladder. Pancreas: No focal lesions or main ductal dilation. Spleen: Normal in size without focal abnormality. Adrenals/Urinary Tract: No adrenal nodules. Bilateral percutaneous nephroureteral catheters in-situ with proximal pigtails in the renal pelvises. Interval development of severe left hydroureteronephrosis and moderate left perinephric stranding. Chronic left renal atrophy. No suspicious renal masses by noncontrast technique. No calculi. Prior cystectomy and ileal conduit. Stomach/Bowel: Normal appearance of the stomach. Right lower quadrant ileal conduit. No evidence of bowel wall thickening, distention, or inflammatory changes. Appendix is not discretely seen. Vascular/Lymphatic: Aortic atherosclerosis. No enlarged abdominal or pelvic lymph nodes. Reproductive: No adnexal masses. Other: Small volume left perinephric free fluid. No free air or fluid collection. Postsurgical changes of the pelvis with multiple bilateral surgical clips along the pelvic sidewalls. Musculoskeletal:  Similar bilateral sacroiliac sclerosis. Similar irregularity and fragmentation of the pubic symphysis. Partially imaged postsurgical changes of left femoral proximal fixation. Multilevel degenerative changes of the partially imaged thoracic and lumbar spine. Postsurgical changes of the anterior abdominal wall. IMPRESSION: 1. Interval development of severe left hydroureteronephrosis and moderate left perinephric stranding. Bilateral percutaneous nephroureteral catheters in-situ with proximal pigtails in the renal pelvises. 2. 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 3.  Aortic Atherosclerosis (ICD10-I70.0). Electronically Signed   By: Agustin Cree M.D.   On: 12/29/2022 11:30      LOS: 1 day     Joycelyn Das, MD [facility] Available via Epic secure chat 7am-7pm After these hours, please refer to coverage provider listed on [email or web address] 12/30/2022, 10:04 AM

## 2022-12-30 NOTE — Procedures (Signed)
  Procedure:  Exchange bilat retrograde nephroureteral catheters , upsize to 71F Preprocedure diagnosis: Hydronephrosis, UTI Postprocedure diagnosis: same EBL:    minimal Complications:   none immediate  See full dictation in [employer].  Thora Lance MD Main # [phone] Pager  [phone] Mobile [phone]

## 2022-12-30 NOTE — Progress Notes (Addendum)
1636- Pt calm resting in bed  no distress noted  pain well controlled with medication regimen  Safety measures in place   call bell within reach    1915-  pt calm resting in bed  safety measures in place call bell within reach  handoff report completed with karen Rn

## 2022-12-31 DIAGNOSIS — E876 Hypokalemia: Secondary | ICD-10-CM | POA: Diagnosis not present

## 2022-12-31 DIAGNOSIS — F418 Other specified anxiety disorders: Secondary | ICD-10-CM

## 2022-12-31 DIAGNOSIS — N390 Urinary tract infection, site not specified: Secondary | ICD-10-CM | POA: Diagnosis not present

## 2022-12-31 DIAGNOSIS — N184 Chronic kidney disease, stage 4 (severe): Secondary | ICD-10-CM | POA: Diagnosis not present

## 2022-12-31 DIAGNOSIS — I5032 Chronic diastolic (congestive) heart failure: Secondary | ICD-10-CM | POA: Diagnosis not present

## 2022-12-31 LAB — CBC
HCT: 34.7 % — ABNORMAL LOW (ref 36.0–46.0)
Hemoglobin: 10.9 g/dL — ABNORMAL LOW (ref 12.0–15.0)
MCH: 30.2 pg (ref 26.0–34.0)
MCHC: 31.4 g/dL (ref 30.0–36.0)
MCV: 96.1 fL (ref 80.0–100.0)
Platelets: 269 K/uL (ref 150–400)
RBC: 3.61 MIL/uL — ABNORMAL LOW (ref 3.87–5.11)
RDW: 13.3 % (ref 11.5–15.5)
WBC: 6.2 K/uL (ref 4.0–10.5)
nRBC: 0 % (ref 0.0–0.2)

## 2022-12-31 LAB — BASIC METABOLIC PANEL WITH GFR
Anion gap: 6 (ref 5–15)
BUN: 27 mg/dL — ABNORMAL HIGH (ref 8–23)
CO2: 21 mmol/L — ABNORMAL LOW (ref 22–32)
Calcium: 7.2 mg/dL — ABNORMAL LOW (ref 8.9–10.3)
Chloride: 109 mmol/L (ref 98–111)
Creatinine, Ser: 2.13 mg/dL — ABNORMAL HIGH (ref 0.44–1.00)
GFR, Estimated: 25 mL/min — ABNORMAL LOW (ref >60)
Glucose, Bld: 95 mg/dL (ref 70–99)
Potassium: 3.6 mmol/L (ref 3.5–5.1)
Sodium: 136 mmol/L (ref 135–145)

## 2022-12-31 LAB — MAGNESIUM: Magnesium: 1.9 mg/dL (ref 1.7–2.4)

## 2022-12-31 MED ORDER — CIPROFLOXACIN HCL 250 MG PO TABS: 250.0000 mg | ORAL_TABLET | Freq: Two times a day (BID) | ORAL | 0 refills | Status: AC

## 2022-12-31 NOTE — Discharge Summary (Signed)
Physician Discharge Summary  Shelia Chan ZOX:[id number] DOB: 1955-09-16 DOA: 12/29/2022  PCP: Ellyn Hack, MD  Admit date: 12/29/2022 Discharge date: 12/31/2022  Admitted From: Home  Discharge disposition: Home   Recommendations for Outpatient Follow-Up:   Follow up with your primary care provider in one week.  Check CBC, BMP, magnesium in the next visit Follow-up with interventional radiology as patient for exchange of stents.   Discharge Diagnosis:   Principal Problem:   Complicated UTI (urinary tract infection) Active Problems:   CKD (chronic kidney disease), stage IV (HCC)   Chronic diastolic CHF (congestive heart failure) (HCC)   Hypokalemia   Asthma, chronic, unspecified asthma severity, with acute exacerbation   UTI (urinary tract infection) due to urinary indwelling catheter (HCC)   COPD (chronic obstructive pulmonary disease) (HCC)   Depression with anxiety   Hyperlipidemia    Discharge Condition: Improved.  Diet recommendation: Low sodium, heart healthy.    Wound care: None.  Code status: Full.   History of Present Illness:   Shelia Chan is a 67 y.o. female with medical history significant of osteoarthritis, asthma, CHF, COPD,  unspecified immunodeficiency disorder, CKD, history of bladder cancer and ileal conduit, multiple nephrostomy tubes placement presented to hospital with left flank pain nausea and vomiting with diarrhea.  Denied fevers.  In the ED vitals were stable.  Urine analysis showed positive nitrate and large leukocyte esterase.  BMP showed mild hypokalemia with potassium of 3.4.  CO2 was 19.  Creatinine was elevated at 1.9. CT renal study show interval development of severe left hydroureteronephrosis and moderate left perinephric stranding.  Bilateral percutaneous nephroureteral catheters in situ with proximal pigtail seen the renal pelvises.  S in the ED patient received patient received NS 500 mL bolus,  hydromorphone 0.5 mg IVP and ceftriaxone 1 g IVPB and was considered for admission to the hospital..    Hospital Course:   Following conditions were addressed during hospitalization as listed below,   Complicated UTI (urinary tract infection) Secondary chronic indwelling catheter.   CT renal was study showed interval development of severe left hydroureteronephrosis with perinephric stranding.underwent percutaneous nephrostomy tube changes by interventional radiology.  Has improved at this time.  No signs of overt infection at this time.  Urine cultures were not collected.  At this time we will continue with ciprofloxacin for next 3 days.  Patient is afebrile without any leukocytosis     CKD (chronic kidney disease), stage IV On calcitriol.  .  Latest creatinine at 2.1 today from 2.4. at baseline at this time.      Hypokalemia Replenished.  Latest potassium of 3.6.     Asthma, chronic, unspecified asthma severity, with acute exacerbation   COPD (chronic obstructive pulmonary disease) (HCC) Continue with bronchodilators.  Patient remained compensated during hospitalization.     Chronic diastolic CHF (congestive heart failure) (HCC) Remained compensated.     Depression with anxiety Remained stable.     Hyperlipidemia  Continue Lipitor    Disposition.  At this time, patient is stable for disposition home with outpatient PCP and radiology follow-up.  Medical Consultants:   None.  Procedures:    Interventional radiology guided bilateral nephrostomy ureteric stent exchange by interventional radiology on 12/30/2022. Subjective:   Today, patient was seen and examined at bedside.  Denies overt pain, nausea, vomiting, fever, chills or rigor.  Wishes to go home.  Discharge Exam:   Vitals:   12/30/22 2055 12/31/22 0513  BP: (!) 94/54 (!) 105/51  Pulse: 69 (!) 55  Resp: 17 16  Temp: 99.2 F (37.3 C) 98 F (36.7 C)  SpO2: 95% 97%   Vitals:   12/30/22 0553 12/30/22 1415  12/30/22 2055 12/31/22 0513  BP: (!) 95/59 118/66 (!) 94/54 (!) 105/51  Pulse: 70 68 69 (!) 55  Resp: 16 18 17 16   Temp: 99.8 F (37.7 C) 98.5 F (36.9 C) 99.2 F (37.3 C) 98 F (36.7 C)  TempSrc: Oral Oral Oral Oral  SpO2: 95% 97% 95% 97%  Weight:      Height:       Body mass index is 24 kg/m.   General: Alert awake, not in obvious distress appears chronically ill and older than stated age, HENT: pupils equally reacting to light,  No scleral pallor or icterus noted. Oral mucosa is moist.  Chest:  Clear breath sounds.  Diminished breath sounds bilaterally. No crackles or wheezes.  CVS: S1 &S2 heard. No murmur.  Regular rate and rhythm. Abdomen: Soft, nontender, nondistended.  Bowel sounds are heard.  Costovertebral angle tenderness diminished.  Ileostomy bag in place. Extremities: No cyanosis, clubbing or edema.  Peripheral pulses are palpable. Psych: Alert, awake and oriented, normal mood CNS:  No cranial nerve deficits.  Power equal in all extremities.   Skin: Warm and dry.  No rashes noted.  The results of significant diagnostics from this hospitalization (including imaging, microbiology, ancillary and laboratory) are listed below for reference.     Diagnostic Studies:   IR NEPHROSTOMY EXCHANGE LEFT Result Date: 12/30/2022 CLINICAL DATA:  Left-sided pain, left hydroureteronephrosis. Previously placed retrograde bilateral nephroureteral catheters through ileal conduit. EXAM: BILATERAL RETROGRADE NEPHROURETERAL CATHETER EXCHANGE UNDER FLUOROSCOPY FLUOROSCOPY TIME:  Radiation Exposure Index (as provided by the fluoroscopic device): 14 MGy air Kerma TECHNIQUE: The procedure, risks (including but not limited to bleeding, infection, organ damage ), benefits, and alternatives were explained to the patient. Questions regarding the procedure were encouraged and answered. The patient understands and consents to the procedure. The nephroureteral catheters and surrounding ostomy were prepped  with Betadine, draped in usual sterile fashion. A small amount of contrast was injected through the left nephroureteral catheter to opacify the renal collecting system. There was significant resistance to injection. The catheter was cut and exchanged over a 0.035" stiff hydrophilic angiographic wire for a new 14 French pigtail catheter, formed centrally within the collecting system under fluoroscopy. Contrast injection confirms appropriate positioning. In similar fashion on the right, a small amount of contrast was injected through the nephroureteral catheter to opacify the renal collecting system. The catheter was cut and exchanged over a 0.035" stiff hydrophilic angiographic wire for a new 14 pigtail catheter, formed centrally within the collecting system under fluoroscopy. Contrast injection confirms appropriate positioning The patient tolerated the procedure well. COMPLICATIONS: COMPLICATIONS None. IMPRESSION: 1. Technically successful exchange and upsizing of bilateral retrograde nephroureteral catheters under fluoroscopy Electronically Signed   By: Corlis Leak M.D.   On: 12/30/2022 15:47   IR NEPHROSTOMY EXCHANGE RIGHT Result Date: 12/30/2022 CLINICAL DATA:  Left-sided pain, left hydroureteronephrosis. Previously placed retrograde bilateral nephroureteral catheters through ileal conduit. EXAM: BILATERAL RETROGRADE NEPHROURETERAL CATHETER EXCHANGE UNDER FLUOROSCOPY FLUOROSCOPY TIME:  Radiation Exposure Index (as provided by the fluoroscopic device): 14 MGy air Kerma TECHNIQUE: The procedure, risks (including but not limited to bleeding, infection, organ damage ), benefits, and alternatives were explained to the patient. Questions regarding the procedure were encouraged and answered. The patient understands and consents to the procedure. The nephroureteral catheters and surrounding ostomy were  prepped with Betadine, draped in usual sterile fashion. A small amount of contrast was injected through the left  nephroureteral catheter to opacify the renal collecting system. There was significant resistance to injection. The catheter was cut and exchanged over a 0.035" stiff hydrophilic angiographic wire for a new 14 French pigtail catheter, formed centrally within the collecting system under fluoroscopy. Contrast injection confirms appropriate positioning. In similar fashion on the right, a small amount of contrast was injected through the nephroureteral catheter to opacify the renal collecting system. The catheter was cut and exchanged over a 0.035" stiff hydrophilic angiographic wire for a new 14 pigtail catheter, formed centrally within the collecting system under fluoroscopy. Contrast injection confirms appropriate positioning The patient tolerated the procedure well. COMPLICATIONS: COMPLICATIONS None. IMPRESSION: 1. Technically successful exchange and upsizing of bilateral retrograde nephroureteral catheters under fluoroscopy Electronically Signed   By: Corlis Leak M.D.   On: 12/30/2022 15:47   CT Renal Stone Study Result Date: 12/29/2022 CLINICAL DATA:  Left-sided pain associated with nausea and vomiting. History of bladder cancer status post stent placement. EXAM: CT ABDOMEN AND PELVIS WITHOUT CONTRAST TECHNIQUE: Multidetector CT imaging of the abdomen and pelvis was performed following the standard protocol without IV contrast. RADIATION DOSE REDUCTION: This exam was performed according to the departmental dose-optimization program which includes automated exposure control, adjustment of the mA and/or kV according to patient size and/or use of iterative reconstruction technique. COMPARISON:  CT abdomen and pelvis dated 07/24/2022 FINDINGS: Lower chest: No focal consolidation or pulmonary nodule in the lung bases. No pleural effusion or pneumothorax demonstrated. Partially imaged heart size is normal. Hepatobiliary: No focal hepatic lesions. No intra or extrahepatic biliary ductal dilation. Normal gallbladder.  Pancreas: No focal lesions or main ductal dilation. Spleen: Normal in size without focal abnormality. Adrenals/Urinary Tract: No adrenal nodules. Bilateral percutaneous nephroureteral catheters in-situ with proximal pigtails in the renal pelvises. Interval development of severe left hydroureteronephrosis and moderate left perinephric stranding. Chronic left renal atrophy. No suspicious renal masses by noncontrast technique. No calculi. Prior cystectomy and ileal conduit. Stomach/Bowel: Normal appearance of the stomach. Right lower quadrant ileal conduit. No evidence of bowel wall thickening, distention, or inflammatory changes. Appendix is not discretely seen. Vascular/Lymphatic: Aortic atherosclerosis. No enlarged abdominal or pelvic lymph nodes. Reproductive: No adnexal masses. Other: Small volume left perinephric free fluid. No free air or fluid collection. Postsurgical changes of the pelvis with multiple bilateral surgical clips along the pelvic sidewalls. Musculoskeletal: Similar bilateral sacroiliac sclerosis. Similar irregularity and fragmentation of the pubic symphysis. Partially imaged postsurgical changes of left femoral proximal fixation. Multilevel degenerative changes of the partially imaged thoracic and lumbar spine. Postsurgical changes of the anterior abdominal wall. IMPRESSION: 1. Interval development of severe left hydroureteronephrosis and moderate left perinephric stranding. Bilateral percutaneous nephroureteral catheters in-situ with proximal pigtails in the renal pelvises. 2. 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 3.  Aortic Atherosclerosis (ICD10-I70.0). Electronically Signed   By: Agustin Cree M.D.   On: 12/29/2022 11:30     Labs:   Basic Metabolic Panel: Recent Labs  Lab 12/29/22 1430 12/29/22 1518 12/30/22 0556 12/31/22 0746  NA 139 140 135 136  K 3.4* 3.8 3.5 3.6  CL 111 106 106 109  CO2 19*  --  19* 21*  GLUCOSE 110*  114* 99 95  BUN 30* 31* 33* 27*  CREATININE 1.97* 2.40* 2.11* 2.13*  CALCIUM 7.4*  --  7.4* 7.2*  MG  --   --   --  1.9   GFR Estimated Creatinine Clearance: 20.6 mL/min (A) (by C-G formula based on SCr of 2.13 mg/dL (H)). Liver Function Tests: Recent Labs  Lab 12/29/22 1430 12/30/22 0556  AST 12* 11*  ALT 9 8  ALKPHOS 63 63  BILITOT 0.6 0.5  PROT 6.0* 5.8*  ALBUMIN 3.0* 2.9*   No results for input(s): "LIPASE", "AMYLASE" in the last 168 hours. No results for input(s): "AMMONIA" in the last 168 hours. Coagulation profile No results for input(s): "INR", "PROTIME" in the last 168 hours.  CBC: Recent Labs  Lab 12/29/22 1213 12/29/22 1518 12/30/22 0917 12/31/22 0746  WBC 11.0*  --  9.4 6.2  NEUTROABS 10.0*  --   --   --   HGB 13.2 11.6* 10.0* 10.9*  HCT 38.1 34.0* 29.5* 34.7*  MCV 90.7  --  92.8 96.1  PLT 374  --  280 269   Cardiac Enzymes: No results for input(s): "CKTOTAL", "CKMB", "CKMBINDEX", "TROPONINI" in the last 168 hours. BNP: Invalid input(s): "POCBNP" CBG: No results for input(s): "GLUCAP" in the last 168 hours. D-Dimer No results for input(s): "DDIMER" in the last 72 hours. Hgb A1c No results for input(s): "HGBA1C" in the last 72 hours. Lipid Profile No results for input(s): "CHOL", "HDL", "LDLCALC", "TRIG", "CHOLHDL", "LDLDIRECT" in the last 72 hours. Thyroid function studies No results for input(s): "TSH", "T4TOTAL", "T3FREE", "THYROIDAB" in the last 72 hours.  Invalid input(s): "FREET3" Anemia work up No results for input(s): "VITAMINB12", "FOLATE", "FERRITIN", "TIBC", "IRON", "RETICCTPCT" in the last 72 hours. Microbiology No results found for this or any previous visit (from the past 240 hours).   Discharge Instructions:   Discharge Instructions     Call MD for:  persistant nausea and vomiting   Complete by: As directed    Call MD for:  severe uncontrolled pain   Complete by: As directed    Call MD for:  temperature >100.4   Complete  by: As directed    Diet - low sodium heart healthy   Complete by: As directed    Discharge instructions   Complete by: As directed    Follow up with your primary care provider in one week. Check blood work at that time. Follow up with Dr Arita Miss urology in 4-5 weeks for stent follow up. Seek medical attention for worsening symptoms. Complete the course of antibiotics.   Increase activity slowly   Complete by: As directed    No wound care   Complete by: As directed       Allergies as of 12/31/2022       Reactions   Metoprolol Shortness Of Breath   Shellfish-derived Products Other (See Comments)        Medication List     STOP taking these medications    busPIRone 7.5 MG tablet Commonly known as: BUSPAR   diltiazem 120 MG 24 hr capsule Commonly known as: CARDIZEM CD   DULoxetine 30 MG capsule Commonly known as: CYMBALTA       TAKE these medications    acetaminophen 500 MG tablet Commonly known as: TYLENOL Take 500-1,000 mg by mouth every 6 (six) hours as needed for moderate pain or fever.   albuterol 108 (90 Base) MCG/ACT inhaler Commonly known as: VENTOLIN HFA Inhale 1 puff into the lungs every 6 (six) hours as needed for shortness of breath.   ARIPiprazole 30 MG tablet Commonly known as: ABILIFY Take 30 mg by mouth daily.   ascorbic acid 500 MG tablet Commonly known as:  VITAMIN C Take 500 mg by mouth daily.   aspirin EC 81 MG tablet Take 81 mg by mouth daily.   atorvastatin 40 MG tablet Commonly known as: LIPITOR Take 1 tablet daily at bedtime.   B-complex with vitamin C tablet Take 1 tablet by mouth daily.   calcitRIOL 0.25 MCG capsule Commonly known as: ROCALTROL Take 0.25 mcg by mouth daily.   ciprofloxacin 250 MG tablet Commonly known as: Cipro Take 1 tablet (250 mg total) by mouth 2 (two) times daily for 3 days.   ferrous sulfate 324 MG Tbec Take 324 mg by mouth daily with breakfast.   furosemide 40 MG tablet Commonly known as:  LASIX Take 40 mg by mouth daily.   oxyCODONE 5 MG immediate release tablet Commonly known as: Oxy IR/ROXICODONE Take 1 tablet (5 mg total) by mouth every 6 (six) hours as needed for severe pain   pantoprazole 40 MG tablet Commonly known as: PROTONIX Take 40 mg by mouth daily as needed (acid reflux).   potassium chloride 20 MEQ packet Commonly known as: KLOR-CON Take 20 mEq by mouth daily as needed (when blood results indicate low potassium).   Prolia 60 MG/ML Sosy injection Generic drug: denosumab Inject 60 mg [place] every 6 months   Vitamin D-3 25 MCG (1000 UT) Caps Take 2,000 Units by mouth daily.        Follow-up Information     Ellyn Hack, MD Follow up in 1 week(s).   Specialty: Family Medicine Contact information: [address] [place] [address] [phone]         Kasandra Knudsen D, MD Follow up in 4 week(s).   Specialty: Urology Why: for urology followup and stent change in future. Contact information: [address] [place] [address] [phone]                  Time coordinating discharge: 39 minutes  Signed:  Monquie Fulgham  [facility] 12/31/2022, 10:13 AM

## 2022-12-31 NOTE — Plan of Care (Signed)

## 2023-01-02 ENCOUNTER — Other Ambulatory Visit (HOSPITAL_COMMUNITY): Payer: Self-pay

## 2023-01-02 MED ORDER — OXYCODONE HCL 5 MG PO TABS
ORAL_TABLET | ORAL | 0 refills | Status: DC
  Filled 2023-01-02: qty 120, 30d supply, fill #0

## 2023-01-05 ENCOUNTER — Other Ambulatory Visit (HOSPITAL_COMMUNITY): Payer: 59

## 2023-01-26 ENCOUNTER — Other Ambulatory Visit (HOSPITAL_COMMUNITY): Payer: 59

## 2023-02-01 ENCOUNTER — Other Ambulatory Visit (HOSPITAL_COMMUNITY): Payer: Self-pay

## 2023-02-01 MED ORDER — OXYCODONE HCL 5 MG PO TABS
5.0000 mg | ORAL_TABLET | Freq: Four times a day (QID) | ORAL | 0 refills | Status: DC | PRN
  Filled 2023-02-01: qty 120, 30d supply, fill #0

## 2023-02-09 ENCOUNTER — Ambulatory Visit (HOSPITAL_COMMUNITY)
Admission: RE | Admit: 2023-02-09 | Discharge: 2023-02-09 | Disposition: A | Discharge status: Home / Self Care | Payer: 59 | Source: Ambulatory Visit | Attending: Diagnostic Radiology | Admitting: Diagnostic Radiology

## 2023-02-09 ENCOUNTER — Other Ambulatory Visit (HOSPITAL_COMMUNITY): Payer: Self-pay

## 2023-02-09 ENCOUNTER — Other Ambulatory Visit (HOSPITAL_COMMUNITY): Payer: Self-pay | Admitting: Diagnostic Radiology

## 2023-02-09 DIAGNOSIS — Z436 Encounter for attention to other artificial openings of urinary tract: Secondary | ICD-10-CM | POA: Diagnosis present

## 2023-02-09 DIAGNOSIS — C680 Malignant neoplasm of urethra: Secondary | ICD-10-CM

## 2023-02-09 HISTORY — PX: IR EXT NEPHROURETERAL CATH EXCHANGE: IMG5418

## 2023-02-09 MED ORDER — IOHEXOL 300 MG/ML  SOLN
50.0000 mL | Freq: Once | INTRAMUSCULAR | Status: AC | PRN
  Administered 2023-02-09: 15 mL

## 2023-02-16 ENCOUNTER — Other Ambulatory Visit: Payer: Self-pay

## 2023-02-23 ENCOUNTER — Other Ambulatory Visit: Payer: Self-pay

## 2023-02-23 NOTE — Progress Notes (Signed)
 Disenrolling - call center spoke with patient on 10.18.24 and today regarding prolia  refill. Patient still needs to make an appointment with office for next injection. Patient aware they will need to reach out to specialty pharmacy when ready to fill medication.

## 2023-02-25 ENCOUNTER — Other Ambulatory Visit (HOSPITAL_COMMUNITY): Payer: Self-pay

## 2023-02-27 ENCOUNTER — Other Ambulatory Visit: Payer: Self-pay

## 2023-02-28 ENCOUNTER — Other Ambulatory Visit: Payer: Self-pay

## 2023-02-28 ENCOUNTER — Other Ambulatory Visit (HOSPITAL_COMMUNITY): Payer: Self-pay

## 2023-02-28 NOTE — Progress Notes (Signed)
Specialty Pharmacy Refill Coordination Note  Shelia Chan is a 68 y.o. female contacted today regarding refills of specialty medication(s) Denosumab (Prolia)   Patient requested Courier to Provider Office   Delivery date: 03/01/23   Verified address: [address] [facility] [address] [place],  [place] [address]   Medication will be filled on 03/01/23.   Patient is aware that she will need to reschedule her appointment. Will send next injection to office on 03/01/23.

## 2023-03-01 ENCOUNTER — Other Ambulatory Visit (HOSPITAL_COMMUNITY): Payer: Self-pay

## 2023-03-01 ENCOUNTER — Other Ambulatory Visit (HOSPITAL_BASED_OUTPATIENT_CLINIC_OR_DEPARTMENT_OTHER): Payer: Self-pay

## 2023-03-01 ENCOUNTER — Other Ambulatory Visit: Payer: Self-pay

## 2023-03-01 NOTE — Progress Notes (Signed)
Patient showed up for Prolia appointment. Prolia had not arrived from pharmacy. Offered same day courier for this afternoon. Patient will reschedule appointment Prolia appointment.   [employer] is [employer] so courier is [employer].   Updated Delivery:  Ship 03/01/23 Deliver: 03/02/23  Patient expressed understanding.

## 2023-03-03 ENCOUNTER — Other Ambulatory Visit (HOSPITAL_COMMUNITY): Payer: Self-pay

## 2023-03-03 MED ORDER — OXYCODONE HCL 5 MG PO TABS
5.0000 mg | ORAL_TABLET | Freq: Four times a day (QID) | ORAL | 0 refills | Status: DC | PRN
Start: 2023-03-03 — End: 2023-03-31
  Filled 2023-03-03: qty 120, 30d supply, fill #0

## 2023-03-22 ENCOUNTER — Other Ambulatory Visit: Payer: Self-pay | Admitting: Radiology

## 2023-03-22 DIAGNOSIS — C680 Malignant neoplasm of urethra: Secondary | ICD-10-CM

## 2023-03-22 NOTE — Addendum Note (Signed)
 Encounter addended by: Arby Barrette on: 03/22/2023 3:49 PM  Actions taken: Imaging Exam ended

## 2023-03-22 NOTE — H&P (Signed)
 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 female  w/ PMH of urethral cancer s/p radical cystectomy, urethrectomy, ileal conduit, COPD, CHF, CKD stage IV and depression w/ anxiety . She has chronic bilateral retrograde NU catheters in place and presents today for routine catheter exchanges , last performed on 02/09/23.  *** Patient is Full Code  Past Medical History:  Diagnosis Date   Arthritis    Asthma    Blood transfusion without reported diagnosis    Cancer (HCC)    CHF (congestive heart failure) (HCC)    COPD (chronic obstructive pulmonary disease) (HCC)    Heart murmur    Immune deficiency disorder (HCC)    Renal disorder     Past Surgical History:  Procedure Laterality Date   BLADDER REMOVAL  118/22/2015   FRACTURE SURGERY Left 02/2018   pins   IR CATHETER TUBE CHANGE  04/15/2019   IR CATHETER TUBE CHANGE  04/15/2019   IR EXT NEPHROURETERAL CATH EXCHANGE  07/15/2019   IR EXT NEPHROURETERAL CATH EXCHANGE  10/14/2019   IR EXT NEPHROURETERAL CATH EXCHANGE  10/14/2019   IR EXT NEPHROURETERAL CATH EXCHANGE  01/06/2020   IR EXT NEPHROURETERAL CATH EXCHANGE  03/16/2020   IR EXT NEPHROURETERAL CATH EXCHANGE  05/25/2020   IR EXT NEPHROURETERAL CATH EXCHANGE  07/17/2020   IR EXT NEPHROURETERAL CATH EXCHANGE  07/17/2020   IR EXT NEPHROURETERAL CATH EXCHANGE  08/14/2020   IR EXT NEPHROURETERAL CATH EXCHANGE  09/25/2020   IR EXT NEPHROURETERAL CATH EXCHANGE  11/06/2020   IR EXT NEPHROURETERAL CATH EXCHANGE  12/25/2020   IR EXT NEPHROURETERAL CATH EXCHANGE  02/09/2021   IR EXT NEPHROURETERAL CATH EXCHANGE  03/19/2021   IR EXT NEPHROURETERAL CATH EXCHANGE  03/19/2021   IR EXT NEPHROURETERAL CATH EXCHANGE  04/23/2021   IR EXT  NEPHROURETERAL CATH EXCHANGE  04/23/2021   IR EXT NEPHROURETERAL CATH EXCHANGE  06/04/2021   IR EXT NEPHROURETERAL CATH EXCHANGE  07/15/2021   IR EXT NEPHROURETERAL CATH EXCHANGE  09/02/2021   IR EXT NEPHROURETERAL CATH EXCHANGE  10/14/2021   IR EXT NEPHROURETERAL CATH EXCHANGE  11/25/2021   IR EXT NEPHROURETERAL CATH EXCHANGE  01/06/2022   IR EXT NEPHROURETERAL CATH EXCHANGE  02/17/2022   IR EXT NEPHROURETERAL CATH EXCHANGE  03/31/2022   IR EXT NEPHROURETERAL CATH EXCHANGE  05/12/2022   IR EXT NEPHROURETERAL CATH EXCHANGE  06/30/2022   IR EXT NEPHROURETERAL CATH EXCHANGE  08/18/2022   IR EXT NEPHROURETERAL CATH EXCHANGE  10/06/2022   IR EXT NEPHROURETERAL CATH EXCHANGE  11/24/2022   IR EXT NEPHROURETERAL CATH EXCHANGE  02/09/2023   IR NEPHROSTOMY EXCHANGE LEFT  12/30/2022   IR NEPHROSTOMY EXCHANGE RIGHT  12/30/2022   IR PERC TUN PERIT CATH WO PORT S&I /IMAG  03/25/2021   IR REMOVAL TUN CV CATH W/O FL  06/04/2021    Allergies: Metoprolol and Shellfish-derived products  Medications: Prior to Admission medications   Medication Sig Start Date End Date Taking? Authorizing Provider  acetaminophen (TYLENOL) 500 MG tablet Take 500-1,000 mg by mouth every 6 (six) hours as needed for moderate pain or fever.    [provider]  albuterol (VENTOLIN HFA) 108 (90 Base) MCG/ACT inhaler Inhale 1 puff into the lungs every 6 (six) hours as needed for shortness of breath. 05/22/20  [provider]  ARIPiprazole (ABILIFY) 30 MG tablet Take 30 mg by mouth daily. 03/19/19   [provider]  aspirin EC 81 MG tablet Take 81 mg by mouth daily.    [provider]  atorvastatin (LIPITOR) 40 MG tablet Take 1 tablet daily at bedtime. 03/17/22     B Complex-C (B-COMPLEX WITH VITAMIN C) tablet Take 1 tablet by mouth daily.    [provider]  calcitRIOL (ROCALTROL) 0.25 MCG capsule Take 0.25 mcg by mouth daily. 02/12/21   [provider]  Cholecalciferol (VITAMIN D-3) 25 MCG (1000  UT) CAPS Take 2,000 Units by mouth daily. Patient not taking: Reported on 12/29/2022    [provider]  denosumab (PROLIA) 60 MG/ML SOSY injection Inject 60 mg [place] every 6 months 05/23/22     ferrous sulfate 324 MG TBEC Take 324 mg by mouth daily with breakfast.    [provider]  furosemide (LASIX) 40 MG tablet Take 40 mg by mouth daily. 10/13/22   [provider]  oxyCODONE (OXY IR/ROXICODONE) 5 MG immediate release tablet Take 1 tablet (5 mg total) by mouth every 6 (six) hours as needed for severe pain 12/02/22     oxyCODONE (OXY IR/ROXICODONE) 5 MG immediate release tablet take 1 tablet (5 mg) by oral route every 6 hours as needed for severe pain 01/02/23     oxyCODONE (OXY IR/ROXICODONE) 5 MG immediate release tablet Take 1 tablet (5 mg total) by mouth every 6 (six) hours as needed for severe pain. 03/03/23     pantoprazole (PROTONIX) 40 MG tablet Take 40 mg by mouth daily as needed (acid reflux). 01/28/21   [provider]  potassium chloride (KLOR-CON) 20 MEQ packet Take 20 mEq by mouth daily as needed (when blood results indicate low potassium).    [provider]  vitamin C (ASCORBIC ACID) 500 MG tablet Take 500 mg by mouth daily.    [provider]     Family History  Problem Relation Age of Onset   Heart disease Maternal Grandfather     Social History   Socioeconomic History   Marital status: Divorced    Spouse name: Not on file   Number of children: Not on file   Years of education: Not on file   Highest education level: Not on file  Occupational History   Occupation: disabled  Tobacco Use   Smoking status: Some Days    Current packs/day: 0.00    Average packs/day: 1 pack/day for 50.0 years (50.0 ttl pk-yrs)    Types: Cigarettes, E-cigarettes    Start date: 01/20/1969    Last attempt to quit: 01/21/2019    Years since quitting: 4.1   Smokeless tobacco: Never   Tobacco comments:    Quit smoking but vapes occasionally   Vaping Use   Vaping status: Never Used  Substance and Sexual Activity   Alcohol use: Not Currently   Drug use: Yes    Types: Marijuana    Comment: occasional    Sexual activity: Not on file  Other Topics Concern   Not on file  Social History Narrative   Not on file   Social Drivers of Health   Financial Resource Strain: Not on file  Food Insecurity: No Food Insecurity (12/30/2022)   Hunger Vital Sign    Worried About Running Out of Food in the Last Year: Never true    Ran Out of Food in the Last Year: Never true  Transportation Needs: No Transportation  Needs (12/30/2022)   PRAPARE - Administrator, Civil Service (Medical): No    Lack of Transportation (Non-Medical): No  Physical Activity: Not on file  Stress: Not on file  Social Connections: Not on file      Review of Systems  Vital Signs:   Advance Care Plan:    Physical Exam  Imaging: No results found.  Labs:  CBC: Recent Labs    07/27/22 0908 12/29/22 1213 12/29/22 1518 12/30/22 0917 12/31/22 0746  WBC 5.5 11.0*  --  9.4 6.2  HGB 9.9* 13.2 11.6* 10.0* 10.9*  HCT 31.8* 38.1 34.0* 29.5* 34.7*  PLT 389 374  --  280 269    COAGS: No results for input(s): "INR", "APTT" in the last 8760 hours.  BMP: Recent Labs    07/28/22 0538 12/29/22 1430 12/29/22 1518 12/30/22 0556 12/31/22 0746  NA 134* 139 140 135 136  K 3.6 3.4* 3.8 3.5 3.6  CL 106 111 106 106 109  CO2 20* 19*  --  19* 21*  GLUCOSE 107* 110* 114* 99 95  BUN 20 30* 31* 33* 27*  CALCIUM 8.4* 7.4*  --  7.4* 7.2*  CREATININE 2.21* 1.97* 2.40* 2.11* 2.13*  GFRNONAA 24* 27*  --  25* 25*    LIVER FUNCTION TESTS: Recent Labs    07/24/22 2214 12/29/22 1430 12/30/22 0556  BILITOT 0.5 0.6 0.5  AST 16 12* 11*  ALT 15 9 8   ALKPHOS 113 63 63  PROT 7.2 6.0* 5.8*  ALBUMIN 3.4* 3.0* 2.9*    TUMOR MARKERS: No results for input(s): "AFPTM", "CEA", "CA199", "CHROMGRNA" in the last 8760 hours.  Assessment and Plan: 68  y.o. female  w/ PMH of urethral cancer s/p radical cystectomy, urethrectomy, ileal conduit (2015), COPD, CHF, CKD stage IV and depression w/ anxiety . She has chronic bilateral retrograde NU catheters in place and presents today for routine catheter exchanges , last performed on 02/09/23. Details/risks of procedure, incl but not limited to, internal bleeding, infection, injury to adjacent structures d/w pt with her understanding and consent.   Thank you for allowing our service to participate in Shelia Chan 's care.  Electronically Signed: D. Jeananne Rama, PA-C   03/22/2023, 9:12 PM      I spent a total of    15 Minutes in face to face in clinical consultation, greater than 50% of which was counseling/coordinating care for image guided exchange of bilateral nephroureteral catheters

## 2023-03-23 ENCOUNTER — Other Ambulatory Visit (HOSPITAL_COMMUNITY): Payer: Self-pay | Admitting: Interventional Radiology

## 2023-03-23 ENCOUNTER — Encounter (HOSPITAL_COMMUNITY): Payer: Self-pay

## 2023-03-23 ENCOUNTER — Other Ambulatory Visit: Payer: Self-pay

## 2023-03-23 ENCOUNTER — Ambulatory Visit (HOSPITAL_COMMUNITY)
Admission: RE | Admit: 2023-03-23 | Discharge: 2023-03-23 | Disposition: A | Discharge status: Home / Self Care | Payer: 59 | Source: Ambulatory Visit | Attending: Interventional Radiology | Admitting: Interventional Radiology

## 2023-03-23 ENCOUNTER — Other Ambulatory Visit: Payer: Self-pay | Admitting: Radiology

## 2023-03-23 DIAGNOSIS — C680 Malignant neoplasm of urethra: Secondary | ICD-10-CM | POA: Diagnosis present

## 2023-03-23 LAB — CBC WITH DIFFERENTIAL/PLATELET
Abs Immature Granulocytes: 0.06 10*3/uL (ref 0.00–0.07)
Basophils Absolute: 0.1 10*3/uL (ref 0.0–0.1)
Basophils Relative: 1 %
Eosinophils Absolute: 0.1 10*3/uL (ref 0.0–0.5)
Eosinophils Relative: 2 %
HCT: 39 % (ref 36.0–46.0)
Hemoglobin: 11.6 g/dL — ABNORMAL LOW (ref 12.0–15.0)
Immature Granulocytes: 1 %
Lymphocytes Relative: 30 %
Lymphs Abs: 1.8 10*3/uL (ref 0.7–4.0)
MCH: 29.7 pg (ref 26.0–34.0)
MCHC: 29.7 g/dL — ABNORMAL LOW (ref 30.0–36.0)
MCV: 100 fL (ref 80.0–100.0)
Monocytes Absolute: 0.5 10*3/uL (ref 0.1–1.0)
Monocytes Relative: 8 %
Neutro Abs: 3.4 10*3/uL (ref 1.7–7.7)
Neutrophils Relative %: 58 %
Platelets: 412 10*3/uL — ABNORMAL HIGH (ref 150–400)
RBC: 3.9 MIL/uL (ref 3.87–5.11)
RDW: 14.5 % (ref 11.5–15.5)
WBC: 6 10*3/uL (ref 4.0–10.5)
nRBC: 0 % (ref 0.0–0.2)

## 2023-03-23 LAB — BASIC METABOLIC PANEL
Anion gap: 9 (ref 5–15)
BUN: 28 mg/dL — ABNORMAL HIGH (ref 8–23)
CO2: 20 mmol/L — ABNORMAL LOW (ref 22–32)
Calcium: 8.2 mg/dL — ABNORMAL LOW (ref 8.9–10.3)
Chloride: 111 mmol/L (ref 98–111)
Creatinine, Ser: 2.27 mg/dL — ABNORMAL HIGH (ref 0.44–1.00)
GFR, Estimated: 23 mL/min — ABNORMAL LOW (ref >60)
Glucose, Bld: 110 mg/dL — ABNORMAL HIGH (ref 70–99)
Potassium: 4.2 mmol/L (ref 3.5–5.1)
Sodium: 140 mmol/L (ref 135–145)

## 2023-03-23 MED ORDER — MIDAZOLAM HCL 2 MG/2ML IJ SOLN
INTRAMUSCULAR | Status: AC
  Filled 2023-03-23: qty 2

## 2023-03-23 MED ORDER — SODIUM CHLORIDE 0.9 % IV SOLN
2.0000 g | Freq: Once | INTRAVENOUS | Status: AC
  Administered 2023-03-23: 2 g via INTRAVENOUS
  Filled 2023-03-23: qty 20

## 2023-03-23 MED ORDER — SODIUM CHLORIDE 0.9 % IV SOLN: INTRAVENOUS | Status: DC

## 2023-03-23 MED ORDER — MIDAZOLAM HCL 2 MG/2ML IJ SOLN
INTRAMUSCULAR | Status: AC | PRN
  Administered 2023-03-23 (×4): 1 mg via INTRAVENOUS

## 2023-03-23 MED ORDER — IOHEXOL 300 MG/ML  SOLN
50.0000 mL | Freq: Once | INTRAMUSCULAR | Status: AC | PRN
  Administered 2023-03-23: 20 mL

## 2023-03-23 MED ORDER — SODIUM CHLORIDE 0.9% FLUSH: 5.0000 mL | Freq: Three times a day (TID) | INTRAVENOUS | Status: DC

## 2023-03-23 MED ORDER — FENTANYL CITRATE (PF) 100 MCG/2ML IJ SOLN
INTRAMUSCULAR | Status: AC
  Filled 2023-03-23: qty 2

## 2023-03-23 MED ORDER — FENTANYL CITRATE (PF) 100 MCG/2ML IJ SOLN
INTRAMUSCULAR | Status: AC | PRN
  Administered 2023-03-23 (×2): 50 ug via INTRAVENOUS

## 2023-03-23 NOTE — Procedures (Signed)
 Vascular and Interventional Radiology Procedure Note  Patient: Shelia Chan DOB: 1955/04/04 Medical Record Number: [id number] Note Date/Time: 03/23/23 3:56 PM   Performing Physician: Roanna Banning, MD Assistant(s): None  Diagnosis: Routine exchange.  Procedure:  BILATERAL NEPHROURETERAL CATHETER EXCHANGE BILATERAL ANTEROGRADE NEPHROSTOGRAM  Anesthesia: Conscious Sedation Complications: None Estimated Blood Loss: Minimal Specimens:  None  Findings:  Successful exchange for a 14 F NUs into the kidney(s).   Plan: Resume previous  care. Follow up for routine nephrostomy tube exchange in 5 week(s).   See detailed procedure note with images in PACS. The patient tolerated the procedure well without incident or complication and was returned to Recovery in stable condition.    Roanna Banning, MD [facility] Rad[facility]   Pager. [phone] Clinic. [phone]

## 2023-03-23 NOTE — Discharge Instructions (Addendum)
 For questions /concerns may call Interventional Radiology at [phone] or  Interventional Radiology clinic [phone]   You may remove your dressing and shower tomorrow afternoon                                  Moderate Conscious Sedation, Adult, Care After After the procedure, it is common to have: Sleepiness for a few hours. Impaired judgment for a few hours. Trouble with balance. Nausea or vomiting if you eat too soon. Follow these instructions at home: For the time period you were told by your health care provider:  Rest. Do not participate in activities where you could fall or become injured. Do not drive or use machinery. Do not drink alcohol. Do not take sleeping pills or medicines that cause drowsiness. Do not make important decisions or sign legal documents. Do not take care of children on your own. Eating and drinking Follow instructions from your health care provider about what you may eat and drink. Drink enough fluid to keep your urine pale yellow. If you vomit: Drink clear fluids slowly and in small amounts as you are able. Clear fluids include water, ice chips, low-calorie sports drinks, and fruit juice that has water added to it (diluted fruit juice). Eat light and bland foods in small amounts as you are able. These foods include bananas, applesauce, rice, lean meats, toast, and crackers. General instructions Take over-the-counter and prescription medicines only as told by your health care provider. Have a responsible adult stay with you for the time you are told. Do not use any products that contain nicotine or tobacco. These products include cigarettes, chewing tobacco, and vaping devices, such as e-cigarettes. If you need help quitting, ask your health care provider. Return to your normal activities as told by your health care provider. Ask your health care provider what activities are safe for you. Your health care provider may give you more instructions.  Make sure you know what you can and cannot do. Contact a health care provider if: You are still sleepy or having trouble with balance after 24 hours. You feel light-headed. You vomit every time you eat or drink. You get a rash. You have a fever. You have redness or swelling around the IV site. Get help right away if: You have trouble breathing. You start to feel confused at home. These symptoms may be an emergency. Get help right away. Call 911. Do not wait to see if the symptoms will go away. Do not drive yourself to the hospital. This information is not intended to replace advice given to you by your health care provider. Make sure you discuss any questions you have with your health care provider. Document Revised: 07/19/2021 Document Reviewed: 07/19/2021 Elsevier Patient Education  2024 [employer]                            NEPHROURETERAL CATHETER PLACEMENT / with bag You will need to empty and change the drainage bag. You will also need to care for the area where the tube was inserted (tube insertion site). How to empty the drainage bag Empty the leg bag or bedside drainage bag when it becomes ? full. Also, empty it before you go to sleep. Most of the bags have a drain valve at the bottom. This allows urine to be emptied. Follow these steps: Hold the drainage bag over a toilet  or collection container. Use a measuring container if your health care provider told you to measure your urine. Open the drain valve of the bag. Allow urine to drain out. After all the urine has drained, close the drain valve fully. Flush urine down the toilet. If you used a collection container, rinse the container. How to change the dressing around the nephrostomy tube Change your bandage (dressing) and clean your tube insertion site as told by your health care provider. You may need to change the dressing every day for the first 2 weeks. After the first 2 weeks, you may be told to change it twice a  week. Supplies needed: Mild soap and water. Split gauze pads, 4 x 4 inches (10 x 10 cm). Gauze pads, 4 x 4 inches (10 x 10 cm). Paper tape. Follow these steps:  Wash hands with soap and water for at least 20 seconds. Gently remove the tape and dressing from around the tube. Do not pull on the tube when you take off the dressing. Avoid using scissors. These may damage the tube. Wash the skin around the tube with soap and water. Rinse well and pat the skin dry. Check the skin around the drain for signs of infection. Check for: Redness, swelling, or pain. More fluid or blood. Warmth. Pus or a bad smell.  How to replace the drainage bag Replace the drainage bag, three-way stopcock, and extension tubing as told by your health care provider. Follow these steps: Empty urine from the drainage bag. Gather a new drainage bag, three-way stopcock, and extension tubing. Remove the drainage bag, stopcock, and tubing from the nephrostomy tube. Attach the new drainage bag, stopcock, and tubing. Dispose of the used drainage bag, stopcock, and tubing.  Call if you experience the following: You have problems with any of the drain valves or tubing. You have pain that does not get better with medicine. You have any signs of infection around your tube insertion site. You have a fever or chills. You make more urine than normal, or it burns when you urinate. You make less urine than normal. You have new blood in your urine. Get help right away if: You have chest pain or trouble breathing. Your tube comes out. These symptoms may be an emergency. Get help right away. Call 911. Do not wait to see if the symptoms will go away. Do not drive yourself to the hospital. This information is not intended to replace advice given to you by your health care provider. Make sure you discuss any questions you have with your health care provider. Document Revised: 07/22/2021 Document Reviewed: 07/22/2021 Elsevier  Patient Education  2024 [employer].

## 2023-03-31 ENCOUNTER — Other Ambulatory Visit (HOSPITAL_COMMUNITY): Payer: Self-pay

## 2023-03-31 MED ORDER — OXYCODONE HCL 5 MG PO TABS
5.0000 mg | ORAL_TABLET | Freq: Four times a day (QID) | ORAL | 0 refills | Status: DC | PRN
  Filled 2023-03-31: qty 120, 30d supply, fill #0

## 2023-04-25 ENCOUNTER — Other Ambulatory Visit: Payer: Self-pay | Admitting: Radiology

## 2023-04-25 DIAGNOSIS — C680 Malignant neoplasm of urethra: Secondary | ICD-10-CM

## 2023-04-25 NOTE — H&P (Signed)
 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  Referring Provider(s): Dr. Estrellita Ludwig, Md   Supervising Physician: Oley Balm  Patient Status: [facility] - Out-pt  Patient is Full Code  History of Present Illness: Shelia Chan is a 68 y.o. female  with PMHx notable for urethral cancer s/p radical cystectomy, urethrectomy, ileal conduit, COPD, CHF, CKD stage IV and depression w/ anxiety .   Patient has chronic bilateral retrograde nephroureteral catheters in place, and is know to IR service, having most recently undergone routine bilateral catheter exchanges on 03/23/23 by Dr. Milford Cage.  Patient is scheduled for same in IR today.  All labs and medications are within acceptable parameters. No pertinent allergies. Patient has been NPO since midnight.    Patient is currently without any significant complaints. Patient is alert and laying in bed, calm. Patient denies any fevers, headache, chest pain, SOB, cough, abdominal pain, nausea, vomiting or bleeding.     Past Medical History:  Diagnosis Date   Arthritis    Asthma    Blood transfusion without reported diagnosis    Cancer (HCC)    CHF (congestive heart failure) (HCC)    COPD (chronic obstructive pulmonary disease) (HCC)    Heart murmur    Immune deficiency disorder (HCC)    Renal disorder     Past Surgical History:  Procedure Laterality Date   ABDOMINAL HYSTERECTOMY     BLADDER REMOVAL  118/22/2015   FRACTURE SURGERY Left 02/2018   pins   IR CATHETER TUBE CHANGE  04/15/2019   IR CATHETER TUBE CHANGE  04/15/2019   IR EXT NEPHROURETERAL CATH EXCHANGE  07/15/2019   IR EXT NEPHROURETERAL CATH EXCHANGE  10/14/2019   IR EXT NEPHROURETERAL CATH EXCHANGE  10/14/2019   IR EXT NEPHROURETERAL CATH EXCHANGE  01/06/2020   IR EXT NEPHROURETERAL CATH  EXCHANGE  03/16/2020   IR EXT NEPHROURETERAL CATH EXCHANGE  05/25/2020   IR EXT NEPHROURETERAL CATH EXCHANGE  07/17/2020   IR EXT NEPHROURETERAL CATH EXCHANGE  07/17/2020   IR EXT NEPHROURETERAL CATH EXCHANGE  08/14/2020   IR EXT NEPHROURETERAL CATH EXCHANGE  09/25/2020   IR EXT NEPHROURETERAL CATH EXCHANGE  11/06/2020   IR EXT NEPHROURETERAL CATH EXCHANGE  12/25/2020   IR EXT NEPHROURETERAL CATH EXCHANGE  02/09/2021   IR EXT NEPHROURETERAL CATH EXCHANGE  03/19/2021   IR EXT NEPHROURETERAL CATH EXCHANGE  03/19/2021   IR EXT NEPHROURETERAL CATH EXCHANGE  04/23/2021   IR EXT NEPHROURETERAL CATH EXCHANGE  04/23/2021   IR EXT NEPHROURETERAL CATH EXCHANGE  06/04/2021   IR EXT NEPHROURETERAL CATH EXCHANGE  07/15/2021   IR EXT NEPHROURETERAL CATH EXCHANGE  09/02/2021   IR EXT NEPHROURETERAL CATH EXCHANGE  10/14/2021   IR EXT NEPHROURETERAL CATH EXCHANGE  11/25/2021   IR EXT NEPHROURETERAL CATH EXCHANGE  01/06/2022   IR EXT NEPHROURETERAL CATH EXCHANGE  02/17/2022   IR EXT NEPHROURETERAL CATH EXCHANGE  03/31/2022   IR EXT NEPHROURETERAL CATH EXCHANGE  05/12/2022   IR EXT NEPHROURETERAL CATH EXCHANGE  06/30/2022   IR EXT NEPHROURETERAL CATH EXCHANGE  08/18/2022   IR EXT NEPHROURETERAL CATH EXCHANGE  10/06/2022   IR EXT NEPHROURETERAL CATH EXCHANGE  11/24/2022   IR EXT NEPHROURETERAL CATH EXCHANGE  02/09/2023   IR EXT NEPHROURETERAL CATH EXCHANGE  03/23/2023   IR NEPHROSTOMY EXCHANGE LEFT  12/30/2022   IR NEPHROSTOMY EXCHANGE RIGHT  12/30/2022  IR PERC TUN PERIT CATH WO PORT S&I /IMAG  03/25/2021   IR REMOVAL TUN CV CATH W/O FL  06/04/2021    Allergies: Metoprolol and Shellfish-derived products  Medications: Prior to Admission medications   Medication Sig Start Date End Date Taking? Authorizing Provider  acetaminophen (TYLENOL) 500 MG tablet Take 500-1,000 mg by mouth every 6 (six) hours as needed for moderate pain or fever.    [provider]  albuterol (VENTOLIN HFA) 108 (90  Base) MCG/ACT inhaler Inhale 1 puff into the lungs every 6 (six) hours as needed for shortness of breath. 05/22/20   [provider]  ARIPiprazole (ABILIFY) 30 MG tablet Take 30 mg by mouth daily. 03/19/19   [provider]  aspirin EC 81 MG tablet Take 81 mg by mouth daily.    [provider]  atorvastatin (LIPITOR) 40 MG tablet Take 1 tablet daily at bedtime. 03/17/22     B Complex-C (B-COMPLEX WITH VITAMIN C) tablet Take 1 tablet by mouth daily.    [provider]  calcitRIOL (ROCALTROL) 0.25 MCG capsule Take 0.25 mcg by mouth daily. 02/12/21   [provider]  Cholecalciferol (VITAMIN D-3) 25 MCG (1000 UT) CAPS Take 2,000 Units by mouth daily. Patient not taking: Reported on 12/29/2022    [provider]  denosumab (PROLIA) 60 MG/ML SOSY injection Inject 60 mg Time every 6 months 05/23/22     ferrous sulfate 324 MG TBEC Take 324 mg by mouth daily with breakfast.    [provider]  furosemide (LASIX) 40 MG tablet Take 40 mg by mouth daily. 10/13/22   [provider]  oxyCODONE (OXY IR/ROXICODONE) 5 MG immediate release tablet Take 1 tablet (5 mg total) by mouth every 6 (six) hours as needed for severe pain 12/02/22     oxyCODONE (OXY IR/ROXICODONE) 5 MG immediate release tablet take 1 tablet (5 mg) by oral route every 6 hours as needed for severe pain 01/02/23     oxyCODONE (OXY IR/ROXICODONE) 5 MG immediate release tablet Take 1 tablet (5 mg total) by mouth every 6 (six) hours as needed for severe pain 03/31/23     pantoprazole (PROTONIX) 40 MG tablet Take 40 mg by mouth daily as needed (acid reflux). 01/28/21   [provider]  potassium chloride (KLOR-CON) 20 MEQ packet Take 20 mEq by mouth daily as needed (when blood results indicate low potassium).    [provider]  vitamin C (ASCORBIC ACID) 500 MG tablet Take 500 mg by mouth daily.    [provider]     Family History  Problem Relation Age of Onset    Heart disease Maternal Grandfather     Social History   Socioeconomic History   Marital status: Divorced    Spouse name: Not on file   Number of children: Not on file   Years of education: Not on file   Highest education level: Not on file  Occupational History   Occupation: disabled  Tobacco Use   Smoking status: Some Days    Current packs/day: 0.00    Average packs/day: 1 pack/day for 50.0 years (50.0 ttl pk-yrs)    Types: Cigarettes, E-cigarettes    Start date: 01/20/1969    Last attempt to quit: 01/21/2019    Years since quitting: 4.2   Smokeless tobacco: Never   Tobacco comments:    Quit smoking but vapes occasionally  Vaping Use   Vaping status: Never Used  Substance and Sexual Activity   Alcohol  use: Not Currently   Drug use: Yes    Types: Marijuana    Comment: occasional    Sexual activity: Not on file  Other Topics Concern   Not on file  Social History Narrative   Not on file   Social Drivers of Health   Financial Resource Strain: Not on file  Food Insecurity: No Food Insecurity (12/30/2022)   Hunger Vital Sign    Worried About Running Out of Food in the Last Year: Never true    Ran Out of Food in the Last Year: Never true  Transportation Needs: No Transportation Needs (12/30/2022)   PRAPARE - Administrator, Civil Service (Medical): No    Lack of Transportation (Non-Medical): No  Physical Activity: Not on file  Stress: Not on file  Social Connections: Not on file    ROS: see above   Vital Signs: Temp 97.6  BP 122/67  HR 60 R 16  O2 SATS 98% RA   Advance Care Plan: no documents on file  Physical Exam: awake/alert; chest- CTA bilat; heart- RRR; abd-soft,+BS,NT; intact urostomy with clear light yellow urine; NU caths visible  Imaging: No results found.  Labs:  CBC: Recent Labs    12/29/22 1213 12/29/22 1518 12/30/22 0917 12/31/22 0746 03/23/23 1308  WBC 11.0*  --  9.4 6.2 6.0  HGB 13.2 11.6* 10.0* 10.9* 11.6*  HCT 38.1  34.0* 29.5* 34.7* 39.0  PLT 374  --  280 269 412*    COAGS: No results for input(s): "INR", "APTT" in the last 8760 hours.  BMP: Recent Labs    12/29/22 1430 12/29/22 1518 12/30/22 0556 12/31/22 0746 03/23/23 1308  NA 139 140 135 136 140  K 3.4* 3.8 3.5 3.6 4.2  CL 111 106 106 109 111  CO2 19*  --  19* 21* 20*  GLUCOSE 110* 114* 99 95 110*  BUN 30* 31* 33* 27* 28*  CALCIUM 7.4*  --  7.4* 7.2* 8.2*  CREATININE 1.97* 2.40* 2.11* 2.13* 2.27*  GFRNONAA 27*  --  25* 25* 23*    LIVER FUNCTION TESTS: Recent Labs    07/24/22 2214 12/29/22 1430 12/30/22 0556  BILITOT 0.5 0.6 0.5  AST 16 12* 11*  ALT 15 9 8   ALKPHOS 113 63 63  PROT 7.2 6.0* 5.8*  ALBUMIN 3.4* 3.0* 2.9*    TUMOR MARKERS: No results for input(s): "AFPTM", "CEA", "CA199", "CHROMGRNA" in the last 8760 hours.  Assessment and Plan: 68 y.o. female  w/ PMH of urethral cancer s/p radical cystectomy, urethrectomy, ileal conduit (2015), COPD, CHF, CKD stage IV and depression w/ anxiety . She has chronic bilateral retrograde NU catheters in place and presents today for routine catheter exchanges , last performed on 02/09/23. Details/risks of procedure, incl but not limited to, internal bleeding, infection, injury to adjacent structures d/w pt with her understanding and consent.   Patient presents for scheduled bilateral retrograde nephroureteral catheter exchange in IR today.  Risks and benefits discussed with the patient including bleeding, infection, damage to adjacent structures, bowel perforation/fistula connection, and sepsis.  All of the patient's questions were answered, patient is agreeable to proceed.  Consent signed and in chart.     Thank you for allowing our service to participate in Shelia Chan 's care.  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

## 2023-04-26 ENCOUNTER — Other Ambulatory Visit: Payer: Self-pay

## 2023-04-26 ENCOUNTER — Encounter (HOSPITAL_COMMUNITY): Payer: Self-pay

## 2023-04-26 ENCOUNTER — Other Ambulatory Visit (HOSPITAL_COMMUNITY): Payer: Self-pay | Admitting: Interventional Radiology

## 2023-04-26 ENCOUNTER — Ambulatory Visit (HOSPITAL_COMMUNITY)
Admission: RE | Admit: 2023-04-26 | Discharge: 2023-04-26 | Disposition: A | Discharge status: Home / Self Care | Source: Ambulatory Visit | Attending: Interventional Radiology | Admitting: Interventional Radiology

## 2023-04-26 DIAGNOSIS — F32A Depression, unspecified: Secondary | ICD-10-CM | POA: Insufficient documentation

## 2023-04-26 DIAGNOSIS — C680 Malignant neoplasm of urethra: Secondary | ICD-10-CM | POA: Insufficient documentation

## 2023-04-26 DIAGNOSIS — Z01818 Encounter for other preprocedural examination: Secondary | ICD-10-CM | POA: Insufficient documentation

## 2023-04-26 DIAGNOSIS — F419 Anxiety disorder, unspecified: Secondary | ICD-10-CM | POA: Insufficient documentation

## 2023-04-26 DIAGNOSIS — J4489 Other specified chronic obstructive pulmonary disease: Secondary | ICD-10-CM | POA: Diagnosis not present

## 2023-04-26 DIAGNOSIS — N184 Chronic kidney disease, stage 4 (severe): Secondary | ICD-10-CM | POA: Diagnosis not present

## 2023-04-26 DIAGNOSIS — I509 Heart failure, unspecified: Secondary | ICD-10-CM | POA: Diagnosis not present

## 2023-04-26 LAB — BASIC METABOLIC PANEL WITH GFR
Anion gap: 9 (ref 5–15)
BUN: 35 mg/dL — ABNORMAL HIGH (ref 8–23)
CO2: 19 mmol/L — ABNORMAL LOW (ref 22–32)
Calcium: 7.7 mg/dL — ABNORMAL LOW (ref 8.9–10.3)
Chloride: 108 mmol/L (ref 98–111)
Creatinine, Ser: 2.39 mg/dL — ABNORMAL HIGH (ref 0.44–1.00)
GFR, Estimated: 21 mL/min — ABNORMAL LOW (ref >60)
Glucose, Bld: 83 mg/dL (ref 70–99)
Potassium: 4.3 mmol/L (ref 3.5–5.1)
Sodium: 136 mmol/L (ref 135–145)

## 2023-04-26 LAB — CBC WITH DIFFERENTIAL/PLATELET
Abs Immature Granulocytes: 0.02 10*3/uL (ref 0.00–0.07)
Basophils Absolute: 0.1 10*3/uL (ref 0.0–0.1)
Basophils Relative: 1 %
Eosinophils Absolute: 0.1 10*3/uL (ref 0.0–0.5)
Eosinophils Relative: 2 %
HCT: 36.3 % (ref 36.0–46.0)
Hemoglobin: 11.6 g/dL — ABNORMAL LOW (ref 12.0–15.0)
Immature Granulocytes: 0 %
Lymphocytes Relative: 31 %
Lymphs Abs: 1.7 10*3/uL (ref 0.7–4.0)
MCH: 30.1 pg (ref 26.0–34.0)
MCHC: 32 g/dL (ref 30.0–36.0)
MCV: 94.3 fL (ref 80.0–100.0)
Monocytes Absolute: 0.4 10*3/uL (ref 0.1–1.0)
Monocytes Relative: 7 %
Neutro Abs: 3.3 10*3/uL (ref 1.7–7.7)
Neutrophils Relative %: 59 %
Platelets: 348 10*3/uL (ref 150–400)
RBC: 3.85 MIL/uL — ABNORMAL LOW (ref 3.87–5.11)
RDW: 14.6 % (ref 11.5–15.5)
WBC: 5.6 10*3/uL (ref 4.0–10.5)
nRBC: 0 % (ref 0.0–0.2)

## 2023-04-26 LAB — PROTIME-INR
INR: 1 (ref 0.8–1.2)
Prothrombin Time: 12.8 s (ref 11.4–15.2)

## 2023-04-26 MED ORDER — MIDAZOLAM HCL 2 MG/2ML IJ SOLN
INTRAMUSCULAR | Status: AC
  Filled 2023-04-26: qty 2

## 2023-04-26 MED ORDER — SODIUM CHLORIDE 0.9 % IV SOLN: INTRAVENOUS | Status: DC

## 2023-04-26 MED ORDER — SODIUM CHLORIDE 0.9% FLUSH: 5.0000 mL | Freq: Three times a day (TID) | INTRAVENOUS | Status: DC

## 2023-04-26 MED ORDER — FENTANYL CITRATE (PF) 100 MCG/2ML IJ SOLN
INTRAMUSCULAR | Status: AC
Start: 2023-04-26 — End: ?
  Filled 2023-04-26: qty 2

## 2023-04-26 MED ORDER — IOHEXOL 300 MG/ML  SOLN
50.0000 mL | Freq: Once | INTRAMUSCULAR | Status: AC | PRN
  Administered 2023-04-26: 30 mL

## 2023-04-26 MED ORDER — MIDAZOLAM HCL 2 MG/2ML IJ SOLN
INTRAMUSCULAR | Status: AC | PRN
  Administered 2023-04-26 (×2): 1 mg via INTRAVENOUS

## 2023-04-26 MED ORDER — HYDROCODONE-ACETAMINOPHEN 5-325 MG PO TABS: 1.0000 | ORAL_TABLET | ORAL | Status: DC | PRN

## 2023-04-26 MED ORDER — FENTANYL CITRATE (PF) 100 MCG/2ML IJ SOLN
INTRAMUSCULAR | Status: AC | PRN
  Administered 2023-04-26 (×2): 50 ug via INTRAVENOUS

## 2023-04-26 MED ORDER — SODIUM CHLORIDE 0.9 % IV SOLN
2.0000 g | Freq: Once | INTRAVENOUS | Status: AC
  Administered 2023-04-26: 2 g via INTRAVENOUS
  Filled 2023-04-26: qty 20

## 2023-04-26 NOTE — Procedures (Signed)
  Procedure:  Exchange bilat retrograde nephroureteral catheters 63f Preprocedure diagnosis: The encounter diagnosis was Urethral cancer (HCC). Postprocedure diagnosis: same EBL:    minimal Complications:   none immediate  See full dictation in [employer].  Thora Lance MD Main # [phone] Pager  [phone] Mobile [phone]

## 2023-04-27 ENCOUNTER — Inpatient Hospital Stay (HOSPITAL_COMMUNITY)
Admission: RE | Admit: 2023-04-27 | Discharge: 2023-04-27 | Disposition: A | Discharge status: Home / Self Care | Source: Ambulatory Visit | Attending: Interventional Radiology | Admitting: Interventional Radiology

## 2023-04-27 ENCOUNTER — Ambulatory Visit (HOSPITAL_COMMUNITY)

## 2023-05-01 ENCOUNTER — Other Ambulatory Visit (HOSPITAL_COMMUNITY): Payer: Self-pay

## 2023-05-01 MED ORDER — OXYCODONE HCL 5 MG PO TABS
5.0000 mg | ORAL_TABLET | Freq: Four times a day (QID) | ORAL | 0 refills | Status: DC | PRN
Start: 2023-05-01 — End: 2023-08-01
  Filled 2023-05-01: qty 120, 30d supply, fill #0

## 2023-05-04 ENCOUNTER — Other Ambulatory Visit (HOSPITAL_COMMUNITY)

## 2023-05-19 IMAGING — MR MR PELVIS WO/W CM
10 series · 48 of 48 positions shown · IV contrast (6ml GADAVIST)
Comparison: None.

04/20/2021 CT scan

CLINICAL DATA: Fracture and bony destructive findings along the
pubis, suspicion for osteomyelitis.

EXAM:
MRI PELVIS WITHOUT CONTRAST
TECHNIQUE: Multiplanar multisequence MR imaging of the pelvis was performed. No
intravenous contrast was administered.

[Series 5: T1 · coronal · 5.0mm · 1.12mm/px · 4 of 28 slices shown (1 of 2)]
[im 1/28]
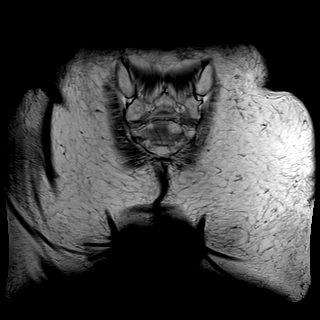
[im 10/28]
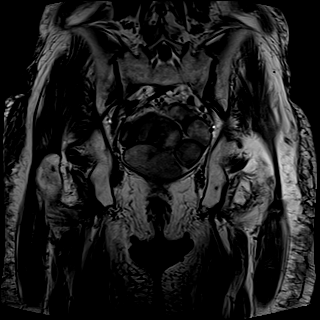
[im 19/28]
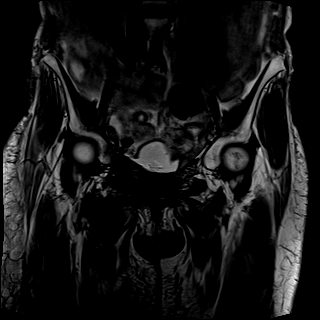
[im 28/28]
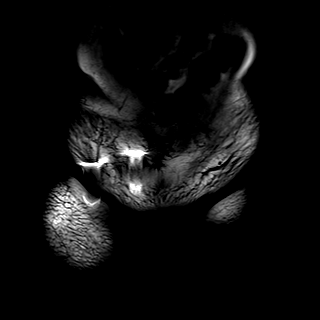

[Series 6: STIR · coronal · 5.0mm · 1.06mm/px · 3 of 28 slices shown (1 of 2)]
[im 1/28]
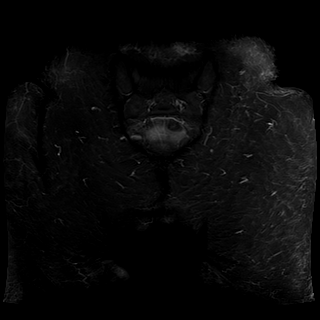
[im 14/28]
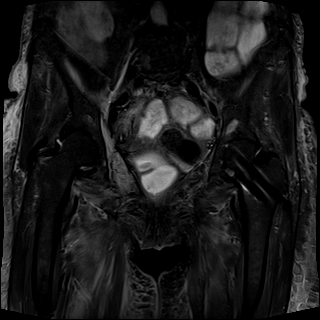
[im 28/28]
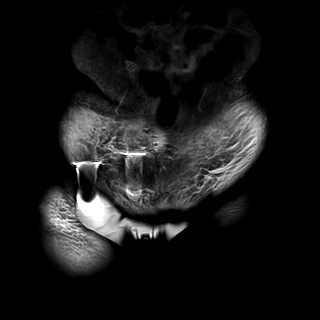

[Series 7: T1 · axial · 4.0mm · 1.06mm/px · z∈[+8,+223]mm · 5 of 44 slices shown (2 of 2)]
[im 1/44]
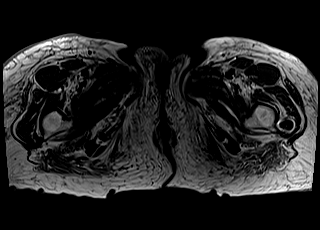
[im 11/44]
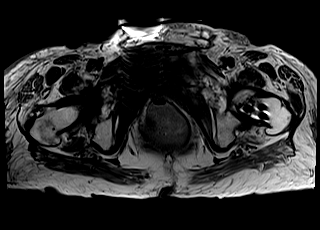
[im 22/44]
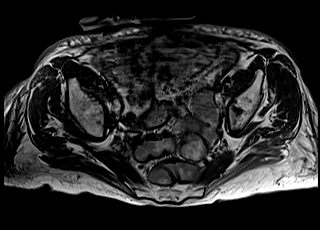
[im 33/44]
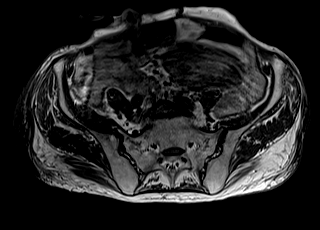
[im 44/44]
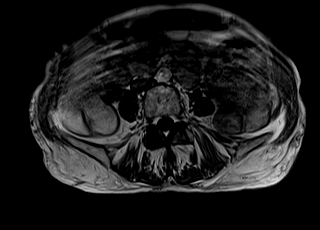

[Series 8: STIR · axial · 4.0mm · 1.06mm/px · z∈[+8,+223]mm · 5 of 44 slices shown (2 of 2)]
[im 1/44]
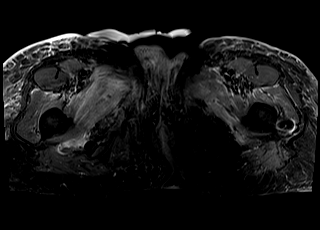
[im 11/44]
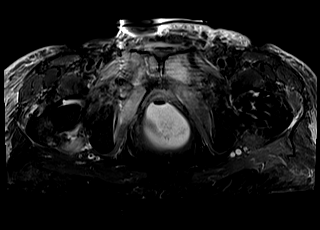
[im 22/44]
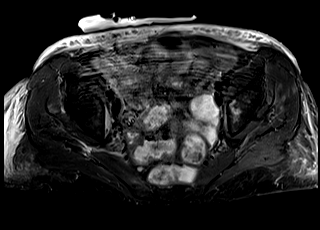
[im 33/44]
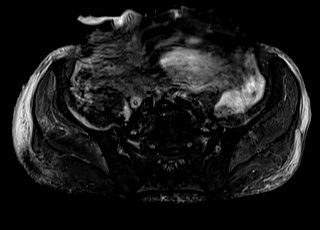
[im 44/44]
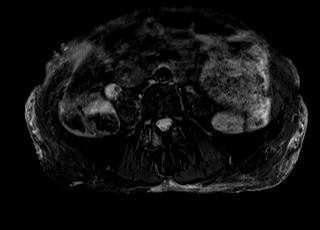

[Series 9: T2 · sagittal · 5.5mm · 1.03mm/px · 6 of 48 slices shown]
[im 1/48]
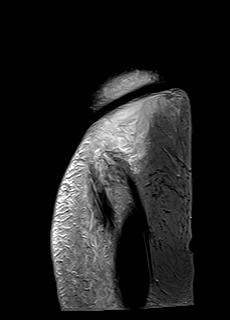
[im 10/48]
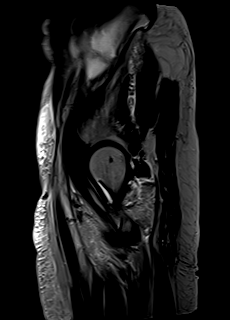
[im 19/48]
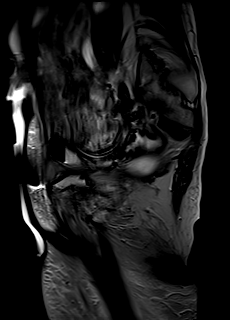
[im 29/48]
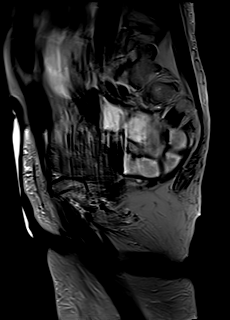
[im 38/48]
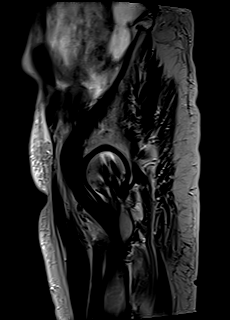
[im 48/48]
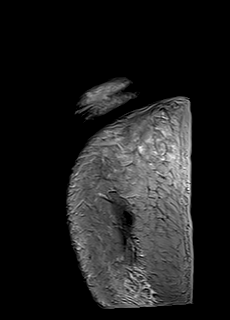

[Series 10: DWI · axial · 6.0mm · 1.36mm/px · z∈[-11,+241]mm · 8 of 68 slices shown (1 of 2)]
[im 1/68]
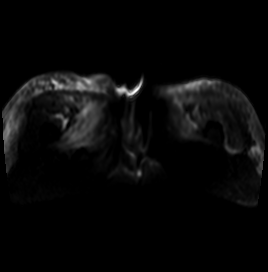
[im 10/68]
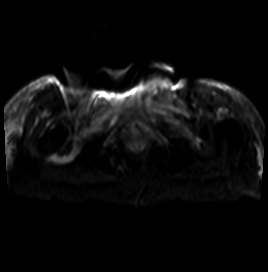
[im 20/68]
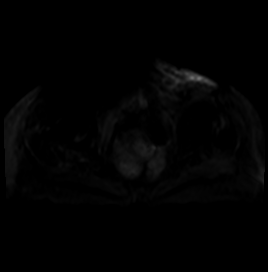
[im 29/68]
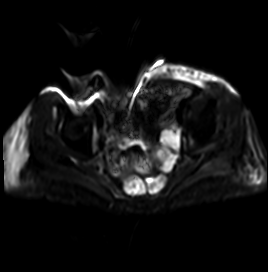
[im 39/68]
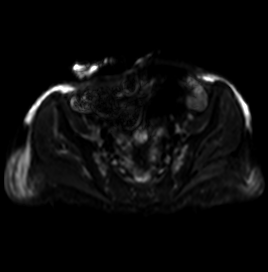
[im 48/68]
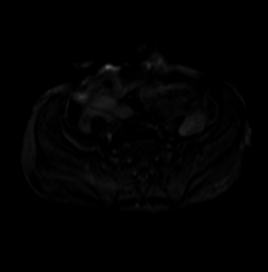
[im 58/68]
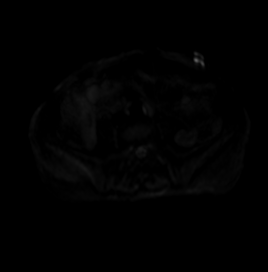
[im 68/68]
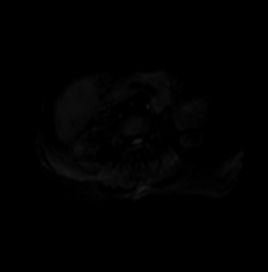

[Series 11: DWI · axial · 6.0mm · 1.36mm/px · z∈[-11,+241]mm · 4 of 36 slices shown (2 of 2)]
[im 1/36]
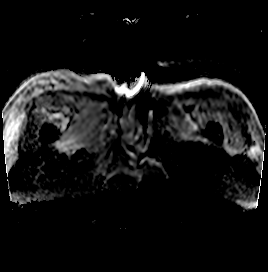
[im 12/36]
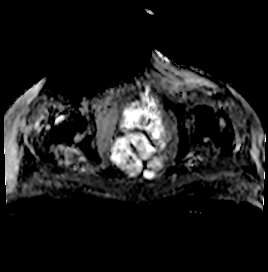
[im 24/36]
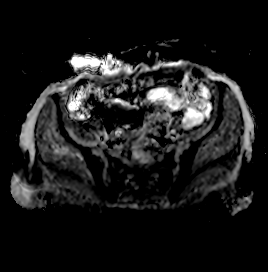
[im 36/36]
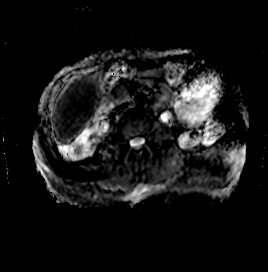

[Series 12: T1 fat-sat · axial · non-contrast · 4.0mm · 1.06mm/px · z∈[+8,+223]mm · 5 of 44 slices shown]
[im 1/44]
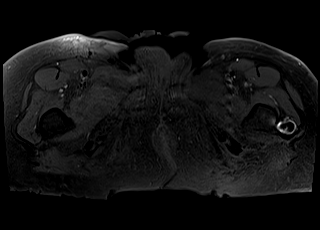
[im 11/44]
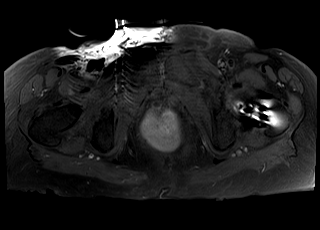
[im 22/44]
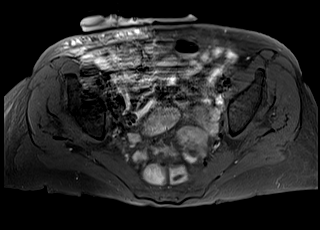
[im 33/44]
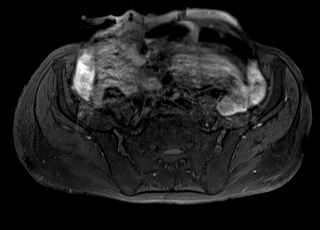
[im 44/44]
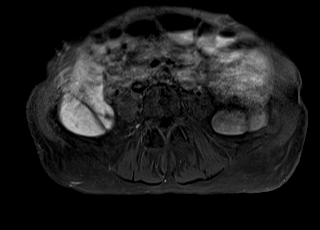

[Series 13: T1 fat-sat post-contrast · axial · 5.0mm · 1.06mm/px · z∈[-19,+250]mm · 5 of 44 slices shown (1 of 2)]
[im 1/44]
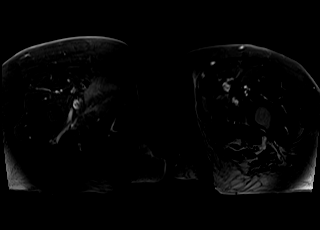
[im 11/44]
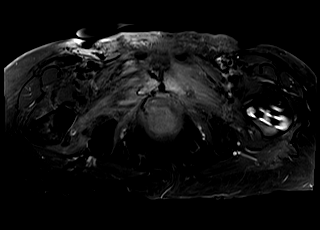
[im 22/44]
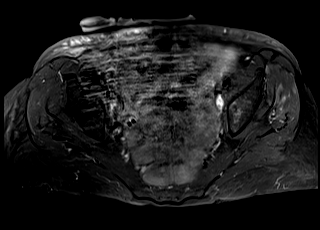
[im 33/44]
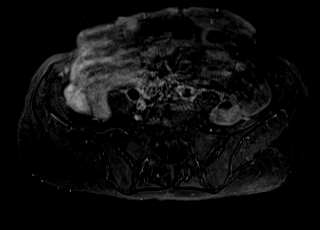
[im 44/44]
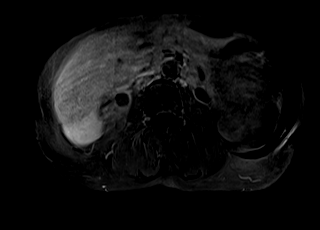

[Series 14: T1 fat-sat post-contrast · coronal · 5.0mm · 1.00mm/px · 3 of 28 slices shown (2 of 2)]
[im 1/28]
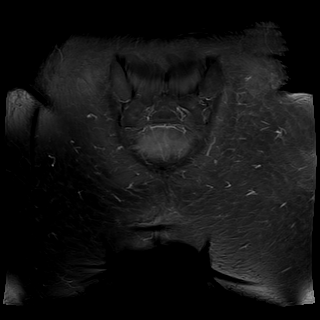
[im 14/28]
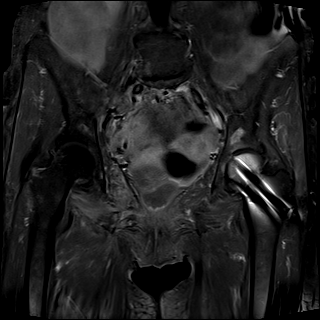
[im 28/28]
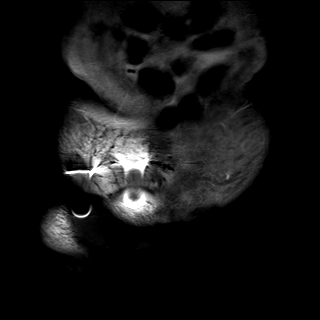

[48 of 48 positions shown; findings below may reference images not displayed]

FINDINGS: OSSEOUS STRUCTURES AND JOINTS: Fracture the right pubic body.
Extensive marrow edema and enhancement air-fluid levels in loops of
bowel in the pelvis including the rectum, air fluid levels in loops
of bowel in the pelvis patient has known in both pubic bodies and
extending into the adjacent pubic rami, especially the right
superior and inferior pubic rami, with fluid in the pubic symphysis
and extensive edema in the surrounding hip adductor musculature and
right obturator internus, highly suspicious for septic joint and
pubic osteomyelitis. Recent CT showed some gas along the fracture
and along the pubic symphysis.

Nonspecific edema along the L4-5 facet joints, probably
degenerative, less likely to be from infection.

Bands of abnormal accentuated fluid signal along the iliac side of
both sacroiliac joints, similar to 03/16/2021, likely from
noninfectious arthropathy.

The band of edema in the right greater trochanter region is less
confluent, possibly reflecting interval healing response of stress
injury.

Cannulated left hip screws noted.

Musculotendinous: Again observed is substantial edema and in the
bilateral hip adductor musculature and right obturator internus
muscle, similar to [DATE] [DATE] and compatible with myositis. The
degree of muscle edema peers mildly increased from 03/16/2021.

Other: There is substantial subcutaneous edema along the flanks and
anterior abdominal wall extending down into the pubis and labia.
Ureteral stents extend through the ureters and urostomy site.
IMPRESSION: 1. Continued substantial pubic osteomyelitis/septic joint, including
a fracture of the right pubic body. The amount of associated edema
is increased compared to 03/16/2021, and there is substantial
regional myositis along the hip adductor musculature and right
obturator internus.
2. Bands of edema along the iliac side of the SI joints most likely
relate to arthropathy rather than infection and are not
substantially changed. Similarly there is low-grade edema along the
facet joints at L4-5, most likely due to facet arthropathy rather
than infection.
3. Reduced edema along the right greater trochanter suggesting some
improvement in the prior stress fracture.
4. Subcutaneous edema especially anteriorly and laterally along the
pelvis.
5. Air fluid levels in visualized loops of bowel. This could
indicate diarrheal process or ileus.

## 2023-05-19 IMAGING — CR DG CHEST 2V
2 series · 2 of 2 positions shown · non-contrast
Comparison: 08/01/2020

CLINICAL DATA: Lower extremity edema

EXAM:
CHEST - 2 VIEW

[w chest lat]
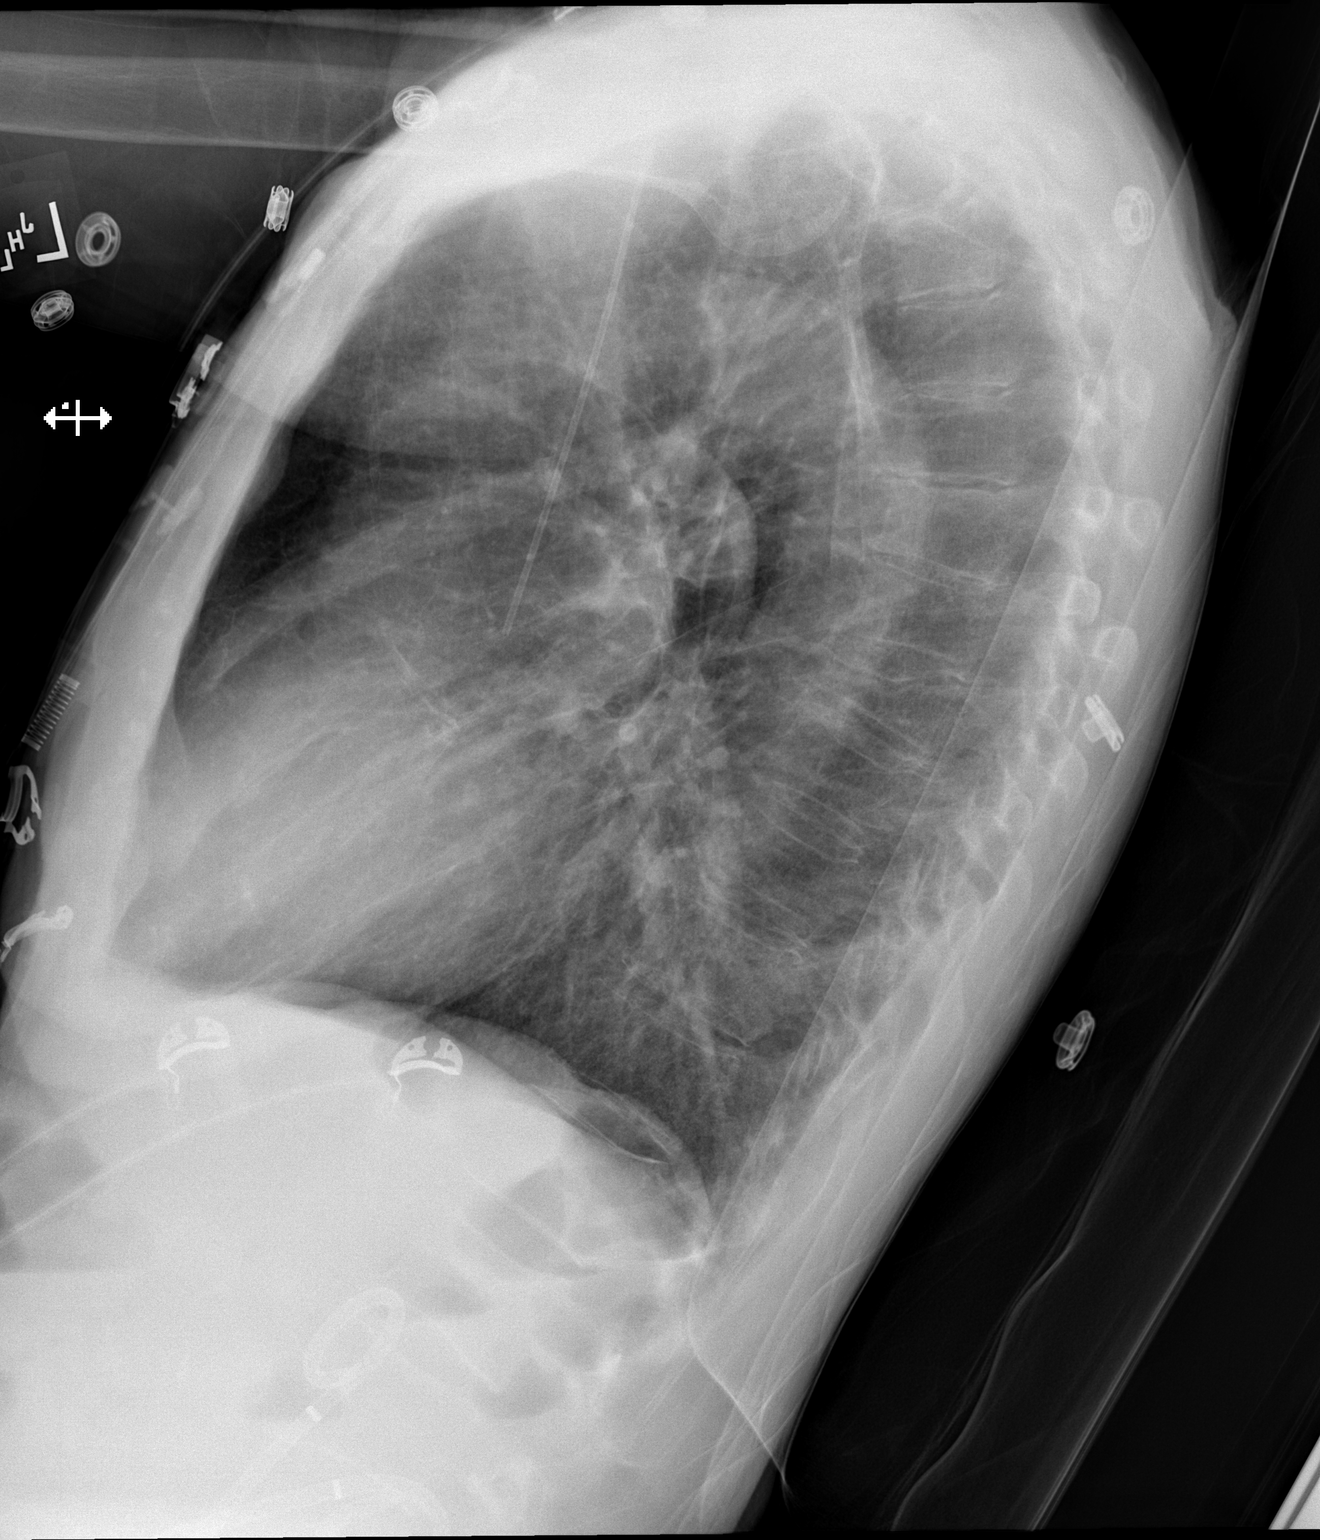

[x chest ap]
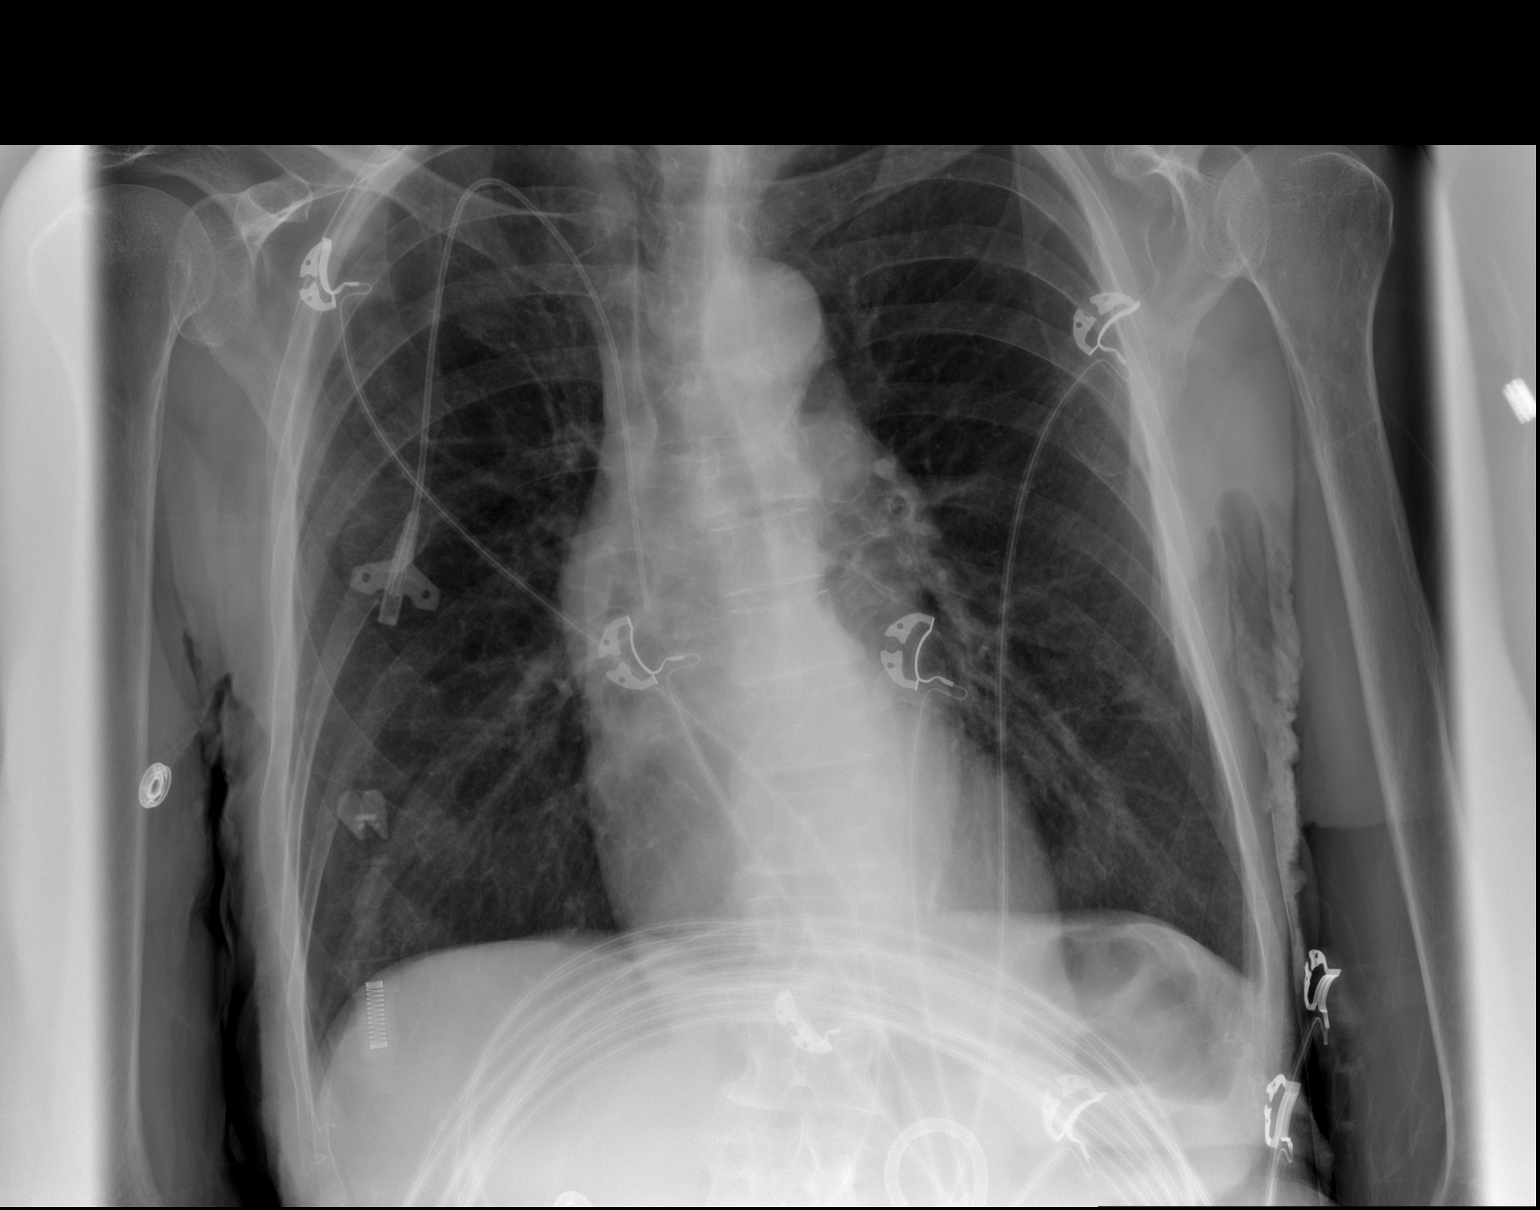

[2 of 2 positions shown; findings below may reference images not displayed]

FINDINGS: Lungs are clear. No pneumothorax or pleural effusion. Right internal
jugular central venous catheter tip noted within the superior vena
cava. Cardiac size within normal limits. Pulmonary vascularity is
normal. No acute bone abnormality.
IMPRESSION: No active cardiopulmonary disease.

## 2023-05-30 ENCOUNTER — Other Ambulatory Visit (HOSPITAL_COMMUNITY)

## 2023-05-30 ENCOUNTER — Ambulatory Visit (HOSPITAL_COMMUNITY)

## 2023-05-31 ENCOUNTER — Other Ambulatory Visit: Payer: Self-pay | Admitting: Radiology

## 2023-05-31 DIAGNOSIS — C680 Malignant neoplasm of urethra: Secondary | ICD-10-CM

## 2023-05-31 NOTE — H&P (Signed)
 Chief Complaint: Patient was seen in consultation today for urethral cancer  at the request of Dr. Vona Grumet, Md   Referring Physician(s): Dr. Vona Grumet, Md   Supervising Physician: Elene Griffes  Patient Status: [facility] - Out-pt  DNR  History of Present Illness: Shelia Chan is a 68 y.o. female with history of urethral cancer-s/p cystectomy, urethrectomy, ileal conduit, COPD, CHF, CKD, HCC, anxiety and depression. Patient is known to IR for routine nephroureteral exchanges, last one 04/26/23. Patient presents today for routine bilateral nephroureteral exchanges.   Patient reports feeling well today. 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 Denies: chest pain, shortness of breath, nausea, vomiting, abdominal pain, diarrhea, cough, and/or fever.  Past Medical History:  Diagnosis Date   Arthritis    Asthma    Blood transfusion without reported diagnosis    Cancer (HCC)    CHF (congestive heart failure) (HCC)    COPD (chronic obstructive pulmonary disease) (HCC)    Heart murmur    Immune deficiency disorder (HCC)    Renal disorder     Past Surgical History:  Procedure Laterality Date   ABDOMINAL HYSTERECTOMY     BLADDER REMOVAL  118/22/2015   FRACTURE SURGERY Left 02/2018   pins   IR CATHETER TUBE CHANGE  04/15/2019   IR CATHETER TUBE CHANGE  04/15/2019   IR EXT NEPHROURETERAL CATH EXCHANGE  07/15/2019   IR EXT NEPHROURETERAL CATH EXCHANGE  10/14/2019   IR EXT NEPHROURETERAL CATH EXCHANGE  10/14/2019   IR EXT NEPHROURETERAL CATH EXCHANGE  01/06/2020   IR EXT NEPHROURETERAL CATH EXCHANGE  03/16/2020   IR EXT NEPHROURETERAL CATH EXCHANGE  05/25/2020   IR EXT NEPHROURETERAL CATH EXCHANGE  07/17/2020   IR EXT NEPHROURETERAL CATH EXCHANGE  07/17/2020   IR EXT NEPHROURETERAL CATH EXCHANGE  08/14/2020   IR EXT NEPHROURETERAL CATH EXCHANGE  09/25/2020   IR EXT NEPHROURETERAL CATH EXCHANGE  11/06/2020   IR EXT NEPHROURETERAL CATH EXCHANGE  12/25/2020   IR EXT  NEPHROURETERAL CATH EXCHANGE  02/09/2021   IR EXT NEPHROURETERAL CATH EXCHANGE  03/19/2021   IR EXT NEPHROURETERAL CATH EXCHANGE  03/19/2021   IR EXT NEPHROURETERAL CATH EXCHANGE  04/23/2021   IR EXT NEPHROURETERAL CATH EXCHANGE  04/23/2021   IR EXT NEPHROURETERAL CATH EXCHANGE  06/04/2021   IR EXT NEPHROURETERAL CATH EXCHANGE  07/15/2021   IR EXT NEPHROURETERAL CATH EXCHANGE  09/02/2021   IR EXT NEPHROURETERAL CATH EXCHANGE  10/14/2021   IR EXT NEPHROURETERAL CATH EXCHANGE  11/25/2021   IR EXT NEPHROURETERAL CATH EXCHANGE  01/06/2022   IR EXT NEPHROURETERAL CATH EXCHANGE  02/17/2022   IR EXT NEPHROURETERAL CATH EXCHANGE  03/31/2022   IR EXT NEPHROURETERAL CATH EXCHANGE  05/12/2022   IR EXT NEPHROURETERAL CATH EXCHANGE  06/30/2022   IR EXT NEPHROURETERAL CATH EXCHANGE  08/18/2022   IR EXT NEPHROURETERAL CATH EXCHANGE  10/06/2022   IR EXT NEPHROURETERAL CATH EXCHANGE  11/24/2022   IR EXT NEPHROURETERAL CATH EXCHANGE  02/09/2023   IR EXT NEPHROURETERAL CATH EXCHANGE  03/23/2023   IR EXT NEPHROURETERAL CATH EXCHANGE  04/26/2023   IR NEPHROSTOMY EXCHANGE LEFT  12/30/2022   IR NEPHROSTOMY EXCHANGE RIGHT  12/30/2022   IR PERC TUN PERIT CATH WO PORT S&I /IMAG  03/25/2021   IR REMOVAL TUN CV CATH W/O FL  06/04/2021    Allergies: Metoprolol  and Shellfish-derived products  Medications: Prior to Admission medications   Medication Sig Start Date End Date Taking? Authorizing Provider  acetaminophen  (TYLENOL ) 500 MG tablet  Take 500-1,000 mg by mouth every 6 (six) hours as needed for moderate pain or fever.    [provider]  albuterol  (VENTOLIN  HFA) 108 (90 Base) MCG/ACT inhaler Inhale 1 puff into the lungs every 6 (six) hours as needed for shortness of breath. 05/22/20   [provider]  ARIPiprazole  (ABILIFY ) 30 MG tablet Take 30 mg by mouth daily. 03/19/19   [provider]  aspirin  EC 81 MG tablet Take 81 mg by mouth daily.    [provider]  atorvastatin   (LIPITOR) 40 MG tablet Take 1 tablet daily at bedtime. 03/17/22     B Complex-C (B-COMPLEX WITH VITAMIN C ) tablet Take 1 tablet by mouth daily.    [provider]  calcitRIOL  (ROCALTROL ) 0.25 MCG capsule Take 0.25 mcg by mouth daily. 02/12/21   [provider]  Cholecalciferol  (VITAMIN D -3) 25 MCG (1000 UT) CAPS Take 2,000 Units by mouth daily. Patient not taking: Reported on 12/29/2022    [provider]  denosumab  (PROLIA ) 60 MG/ML SOSY injection Inject 60 mg Simla every 6 months 05/23/22     ferrous sulfate  324 MG TBEC Take 324 mg by mouth daily with breakfast.    [provider]  furosemide  (LASIX ) 40 MG tablet Take 40 mg by mouth daily. 10/13/22   [provider]  oxyCODONE  (OXY IR/ROXICODONE ) 5 MG immediate release tablet Take 1 tablet (5 mg total) by mouth every 6 (six) hours as needed for severe pain 12/02/22     oxyCODONE  (OXY IR/ROXICODONE ) 5 MG immediate release tablet take 1 tablet (5 mg) by oral route every 6 hours as needed for severe pain 01/02/23     oxyCODONE  (OXY IR/ROXICODONE ) 5 MG immediate release tablet Take 1 tablet (5 mg total) by mouth every 6 (six) hours as needed for severe pain 05/01/23     pantoprazole  (PROTONIX ) 40 MG tablet Take 40 mg by mouth daily as needed (acid reflux). 01/28/21   [provider]  potassium chloride  (KLOR-CON ) 20 MEQ packet Take 20 mEq by mouth daily as needed (when blood results indicate low potassium).    [provider]  vitamin C  (ASCORBIC ACID ) 500 MG tablet Take 500 mg by mouth daily.    [provider]     Family History  Problem Relation Age of Onset   Heart disease Maternal Grandfather     Social History   Socioeconomic History   Marital status: Divorced    Spouse name: Not on file   Number of children: Not on file   Years of education: Not on file   Highest education level: Not on file  Occupational History   Occupation: disabled  Tobacco Use   Smoking status:  Some Days    Current packs/day: 0.00    Average packs/day: 1 pack/day for 50.0 years (50.0 ttl pk-yrs)    Types: Cigarettes, E-cigarettes    Start date: 01/20/1969    Last attempt to quit: 01/21/2019    Years since quitting: 4.3   Smokeless tobacco: Never   Tobacco comments:    Quit smoking but vapes occasionally  Vaping Use   Vaping status: Never Used  Substance and Sexual Activity   Alcohol use: Not Currently   Drug use: Yes    Types: Marijuana    Comment: occasional    Sexual activity: Not on file  Other Topics Concern   Not on file  Social History Narrative   Not on file   Social Drivers of Health   Financial  Resource Strain: Not on file  Food Insecurity: No Food Insecurity (12/30/2022)   Hunger Vital Sign    Worried About Running Out of Food in the Last Year: Never true    Ran Out of Food in the Last Year: Never true  Transportation Needs: No Transportation Needs (12/30/2022)   PRAPARE - Administrator, Civil Service (Medical): No    Lack of Transportation (Non-Medical): No  Physical Activity: Not on file  Stress: Not on file  Social Connections: Not on file     Review of Systems: A 12 point ROS discussed and pertinent positives are indicated in the HPI above.  All other systems are negative.  Review of Systems  Constitutional:  Negative for fever.  Respiratory:  Negative for cough and shortness of breath.   Cardiovascular:  Negative for chest pain.  Gastrointestinal:  Negative for abdominal pain, diarrhea, nausea and vomiting.    Vital Signs: BP 116/71   Pulse 73   Temp 97.9 F (36.6 C)   Resp 15   Ht 5\' 1"  (1.549 m)   Wt 127 lb (57.6 kg)   SpO2 98%   BMI 24.00 kg/m   Advance Care Plan: The advanced care plan/surrogate decision maker was discussed at the time of visit and documented in the medical record.    Physical Exam HENT:     Head: Normocephalic and atraumatic.     Mouth/Throat:     Mouth: Mucous membranes are moist.     Pharynx:  Oropharynx is clear.  Cardiovascular:     Rate and Rhythm: Normal rate and regular rhythm.  Pulmonary:     Effort: Pulmonary effort is normal.     Breath sounds: Normal breath sounds.  Abdominal:     General: Abdomen is flat.  Skin:    General: Skin is warm.  Neurological:     General: No focal deficit present.     Mental Status: She is alert and oriented to person, place, and time.     Imaging: No results found.  Labs:  CBC: Recent Labs    12/31/22 0746 03/23/23 1308 04/26/23 1200 06/01/23 1215  WBC 6.2 6.0 5.6 7.1  HGB 10.9* 11.6* 11.6* 12.5  HCT 34.7* 39.0 36.3 39.3  PLT 269 412* 348 373    COAGS: Recent Labs    04/26/23 1200  INR 1.0    BMP: Recent Labs    12/30/22 0556 12/31/22 0746 03/23/23 1308 04/26/23 1200  NA 135 136 140 136  K 3.5 3.6 4.2 4.3  CL 106 109 111 108  CO2 19* 21* 20* 19*  GLUCOSE 99 95 110* 83  BUN 33* 27* 28* 35*  CALCIUM  7.4* 7.2* 8.2* 7.7*  CREATININE 2.11* 2.13* 2.27* 2.39*  GFRNONAA 25* 25* 23* 21*    LIVER FUNCTION TESTS: Recent Labs    07/24/22 2214 12/29/22 1430 12/30/22 0556  BILITOT 0.5 0.6 0.5  AST 16 12* 11*  ALT 15 9 8   ALKPHOS 113 63 63  PROT 7.2 6.0* 5.8*  ALBUMIN 3.4* 3.0* 2.9*    TUMOR MARKERS: No results for input(s): "AFPTM", "CEA", "CA199", "CHROMGRNA" in the last 8760 hours.  Assessment and Plan: Urethral cancer  Patient is a 68 y/o female with history of urethral cancer-s/p cystectomy, urethrectomy, ileal conduit, COPD, CHF, CKD, HCC, anxiety and depression. Patient presents for a routine bilateral nephroureteral catheter exchange with sedation. Patient is NPO and within normal health. Vitals and labwork reviewed.  Patient wishes to be DNR for this  procedure. Patient does not wish to have any intervention in the event life threatening complications arise during the procedure. DNR order placed.   Risks and benefits of bilateral nephroureteral catheter exchange, bilateral was discussed with  the patient including, but not limited to, infection, bleeding, significant bleeding causing loss or decrease in renal function or damage to adjacent structures.   All of the patient's questions were answered, patient is agreeable to proceed.  Consent signed and in chart.    Thank you for this interesting consult.  I greatly enjoyed meeting Nazarene Dillard Nardone and look forward to participating in their care.  A copy of this report was sent to the requesting provider on this date.  Electronically Signed: Lawrance Presume, PA 06/01/2023, 12:52 PM   I spent a total of    15 Minutes in face to face in clinical consultation, greater than 50% of which was counseling/coordinating care for urethral cancer

## 2023-06-01 ENCOUNTER — Other Ambulatory Visit: Payer: Self-pay

## 2023-06-01 ENCOUNTER — Ambulatory Visit (HOSPITAL_COMMUNITY)
Admission: RE | Admit: 2023-06-01 | Discharge: 2023-06-01 | Disposition: A | Discharge status: Home / Self Care | Source: Ambulatory Visit | Attending: Interventional Radiology | Admitting: Interventional Radiology

## 2023-06-01 ENCOUNTER — Other Ambulatory Visit (HOSPITAL_COMMUNITY): Payer: Self-pay | Admitting: Diagnostic Radiology

## 2023-06-01 ENCOUNTER — Encounter (HOSPITAL_COMMUNITY): Payer: Self-pay

## 2023-06-01 ENCOUNTER — Other Ambulatory Visit (HOSPITAL_COMMUNITY): Payer: Self-pay

## 2023-06-01 DIAGNOSIS — C680 Malignant neoplasm of urethra: Secondary | ICD-10-CM | POA: Diagnosis present

## 2023-06-01 DIAGNOSIS — F17210 Nicotine dependence, cigarettes, uncomplicated: Secondary | ICD-10-CM | POA: Diagnosis not present

## 2023-06-01 DIAGNOSIS — J4489 Other specified chronic obstructive pulmonary disease: Secondary | ICD-10-CM | POA: Insufficient documentation

## 2023-06-01 DIAGNOSIS — F32A Depression, unspecified: Secondary | ICD-10-CM | POA: Diagnosis not present

## 2023-06-01 DIAGNOSIS — N189 Chronic kidney disease, unspecified: Secondary | ICD-10-CM | POA: Diagnosis not present

## 2023-06-01 DIAGNOSIS — I509 Heart failure, unspecified: Secondary | ICD-10-CM | POA: Insufficient documentation

## 2023-06-01 DIAGNOSIS — Z906 Acquired absence of other parts of urinary tract: Secondary | ICD-10-CM | POA: Diagnosis not present

## 2023-06-01 DIAGNOSIS — F419 Anxiety disorder, unspecified: Secondary | ICD-10-CM | POA: Insufficient documentation

## 2023-06-01 DIAGNOSIS — Z66 Do not resuscitate: Secondary | ICD-10-CM | POA: Diagnosis not present

## 2023-06-01 LAB — CBC WITH DIFFERENTIAL/PLATELET
Abs Immature Granulocytes: 0.02 10*3/uL (ref 0.00–0.07)
Basophils Absolute: 0.1 10*3/uL (ref 0.0–0.1)
Basophils Relative: 1 %
Eosinophils Absolute: 0.1 10*3/uL (ref 0.0–0.5)
Eosinophils Relative: 1 %
HCT: 39.3 % (ref 36.0–46.0)
Hemoglobin: 12.5 g/dL (ref 12.0–15.0)
Immature Granulocytes: 0 %
Lymphocytes Relative: 26 %
Lymphs Abs: 1.8 10*3/uL (ref 0.7–4.0)
MCH: 30.6 pg (ref 26.0–34.0)
MCHC: 31.8 g/dL (ref 30.0–36.0)
MCV: 96.1 fL (ref 80.0–100.0)
Monocytes Absolute: 0.4 10*3/uL (ref 0.1–1.0)
Monocytes Relative: 6 %
Neutro Abs: 4.6 10*3/uL (ref 1.7–7.7)
Neutrophils Relative %: 66 %
Platelets: 373 10*3/uL (ref 150–400)
RBC: 4.09 MIL/uL (ref 3.87–5.11)
RDW: 14.4 % (ref 11.5–15.5)
WBC: 7.1 10*3/uL (ref 4.0–10.5)
nRBC: 0 % (ref 0.0–0.2)

## 2023-06-01 LAB — BASIC METABOLIC PANEL WITH GFR
Anion gap: 12 (ref 5–15)
BUN: 34 mg/dL — ABNORMAL HIGH (ref 8–23)
CO2: 16 mmol/L — ABNORMAL LOW (ref 22–32)
Calcium: 8.8 mg/dL — ABNORMAL LOW (ref 8.9–10.3)
Chloride: 110 mmol/L (ref 98–111)
Creatinine, Ser: 2.52 mg/dL — ABNORMAL HIGH (ref 0.44–1.00)
GFR, Estimated: 20 mL/min — ABNORMAL LOW (ref >60)
Glucose, Bld: 110 mg/dL — ABNORMAL HIGH (ref 70–99)
Potassium: 3.7 mmol/L (ref 3.5–5.1)
Sodium: 138 mmol/L (ref 135–145)

## 2023-06-01 MED ORDER — FENTANYL CITRATE (PF) 100 MCG/2ML IJ SOLN
INTRAMUSCULAR | Status: AC | PRN
  Administered 2023-06-01 (×2): 50 ug via INTRAVENOUS

## 2023-06-01 MED ORDER — MIDAZOLAM HCL 2 MG/2ML IJ SOLN
INTRAMUSCULAR | Status: AC
  Filled 2023-06-01: qty 4

## 2023-06-01 MED ORDER — SODIUM CHLORIDE 0.9 % IV SOLN: INTRAVENOUS | Status: DC

## 2023-06-01 MED ORDER — OXYCODONE HCL 5 MG PO TABS
5.0000 mg | ORAL_TABLET | Freq: Four times a day (QID) | ORAL | 0 refills | Status: DC
  Filled 2023-06-01: qty 120, 30d supply, fill #0

## 2023-06-01 MED ORDER — MIDAZOLAM HCL 2 MG/2ML IJ SOLN
INTRAMUSCULAR | Status: AC | PRN
  Administered 2023-06-01 (×3): 1 mg via INTRAVENOUS

## 2023-06-01 MED ORDER — FENTANYL CITRATE (PF) 100 MCG/2ML IJ SOLN
INTRAMUSCULAR | Status: AC
  Filled 2023-06-01: qty 2

## 2023-06-01 NOTE — Procedures (Addendum)
 Interventional Radiology Procedure:   Indications: Needs routine exchange of retrograde nephroureteral catheters  Procedure: Exchange of bilateral nephroureteral catheters  Findings: New bilateral 14 Fr nephroureteral catheters placed via the ileal conduit.  Complications: None     EBL: Minimal  Plan: Plan for routine exchanges  Isao Seltzer R. Julietta Ogren, MD  Pager: [phone]

## 2023-06-27 NOTE — Addendum Note (Signed)
 Encounter addended by: Bernabe Brew, RN on: 06/27/2023 11:24 AM  Actions taken: Imaging Exam ended

## 2023-06-27 NOTE — Addendum Note (Signed)
 Encounter addended by: Bernabe Brew, RN on: 06/27/2023 11:09 AM  Actions taken: Imaging Exam ended

## 2023-07-02 ENCOUNTER — Other Ambulatory Visit: Payer: Self-pay

## 2023-07-02 ENCOUNTER — Inpatient Hospital Stay (HOSPITAL_COMMUNITY)
Admission: EM | Admit: 2023-07-02 | Discharge: 2023-07-07 | DRG: 699 | Disposition: A | Discharge status: Home / Self Care | Attending: Family Medicine | Admitting: Family Medicine

## 2023-07-02 ENCOUNTER — Emergency Department (HOSPITAL_COMMUNITY)

## 2023-07-02 ENCOUNTER — Encounter (HOSPITAL_COMMUNITY): Payer: Self-pay

## 2023-07-02 DIAGNOSIS — E8721 Acute metabolic acidosis: Secondary | ICD-10-CM | POA: Diagnosis present

## 2023-07-02 DIAGNOSIS — R160 Hepatomegaly, not elsewhere classified: Secondary | ICD-10-CM

## 2023-07-02 DIAGNOSIS — R911 Solitary pulmonary nodule: Secondary | ICD-10-CM

## 2023-07-02 DIAGNOSIS — D519 Vitamin B12 deficiency anemia, unspecified: Secondary | ICD-10-CM | POA: Insufficient documentation

## 2023-07-02 DIAGNOSIS — Z91013 Allergy to seafood: Secondary | ICD-10-CM

## 2023-07-02 DIAGNOSIS — Y838 Other surgical procedures as the cause of abnormal reaction of the patient, or of later complication, without mention of misadventure at the time of the procedure: Secondary | ICD-10-CM | POA: Diagnosis present

## 2023-07-02 DIAGNOSIS — N390 Urinary tract infection, site not specified: Principal | ICD-10-CM | POA: Diagnosis present

## 2023-07-02 DIAGNOSIS — Z800 Family history of malignant neoplasm of digestive organs: Secondary | ICD-10-CM

## 2023-07-02 DIAGNOSIS — D630 Anemia in neoplastic disease: Secondary | ICD-10-CM | POA: Diagnosis present

## 2023-07-02 DIAGNOSIS — Z90710 Acquired absence of both cervix and uterus: Secondary | ICD-10-CM

## 2023-07-02 DIAGNOSIS — I4719 Other supraventricular tachycardia: Secondary | ICD-10-CM | POA: Diagnosis present

## 2023-07-02 DIAGNOSIS — K769 Liver disease, unspecified: Secondary | ICD-10-CM | POA: Insufficient documentation

## 2023-07-02 DIAGNOSIS — Z888 Allergy status to other drugs, medicaments and biological substances status: Secondary | ICD-10-CM

## 2023-07-02 DIAGNOSIS — E869 Volume depletion, unspecified: Secondary | ICD-10-CM | POA: Diagnosis present

## 2023-07-02 DIAGNOSIS — Z936 Other artificial openings of urinary tract status: Secondary | ICD-10-CM

## 2023-07-02 DIAGNOSIS — D631 Anemia in chronic kidney disease: Secondary | ICD-10-CM | POA: Diagnosis present

## 2023-07-02 DIAGNOSIS — Z79899 Other long term (current) drug therapy: Secondary | ICD-10-CM

## 2023-07-02 DIAGNOSIS — Z9889 Other specified postprocedural states: Secondary | ICD-10-CM

## 2023-07-02 DIAGNOSIS — Z8559 Personal history of malignant neoplasm of other urinary tract organ: Secondary | ICD-10-CM

## 2023-07-02 DIAGNOSIS — D518 Other vitamin B12 deficiency anemias: Secondary | ICD-10-CM

## 2023-07-02 DIAGNOSIS — C7951 Secondary malignant neoplasm of bone: Secondary | ICD-10-CM | POA: Diagnosis present

## 2023-07-02 DIAGNOSIS — E876 Hypokalemia: Secondary | ICD-10-CM | POA: Diagnosis present

## 2023-07-02 DIAGNOSIS — Z87891 Personal history of nicotine dependence: Secondary | ICD-10-CM

## 2023-07-02 DIAGNOSIS — Z85828 Personal history of other malignant neoplasm of skin: Secondary | ICD-10-CM

## 2023-07-02 DIAGNOSIS — C787 Secondary malignant neoplasm of liver and intrahepatic bile duct: Secondary | ICD-10-CM | POA: Diagnosis present

## 2023-07-02 DIAGNOSIS — N179 Acute kidney failure, unspecified: Secondary | ICD-10-CM | POA: Diagnosis present

## 2023-07-02 DIAGNOSIS — E785 Hyperlipidemia, unspecified: Secondary | ICD-10-CM | POA: Diagnosis present

## 2023-07-02 DIAGNOSIS — E871 Hypo-osmolality and hyponatremia: Secondary | ICD-10-CM | POA: Diagnosis present

## 2023-07-02 DIAGNOSIS — N184 Chronic kidney disease, stage 4 (severe): Secondary | ICD-10-CM | POA: Diagnosis present

## 2023-07-02 DIAGNOSIS — Z960 Presence of urogenital implants: Secondary | ICD-10-CM | POA: Diagnosis present

## 2023-07-02 DIAGNOSIS — Z91048 Other nonmedicinal substance allergy status: Secondary | ICD-10-CM

## 2023-07-02 DIAGNOSIS — J4489 Other specified chronic obstructive pulmonary disease: Secondary | ICD-10-CM | POA: Diagnosis present

## 2023-07-02 DIAGNOSIS — T83598A Infection and inflammatory reaction due to other prosthetic device, implant and graft in urinary system, initial encounter: Secondary | ICD-10-CM | POA: Diagnosis not present

## 2023-07-02 DIAGNOSIS — Z7982 Long term (current) use of aspirin: Secondary | ICD-10-CM

## 2023-07-02 DIAGNOSIS — I5032 Chronic diastolic (congestive) heart failure: Secondary | ICD-10-CM | POA: Diagnosis present

## 2023-07-02 DIAGNOSIS — Z66 Do not resuscitate: Secondary | ICD-10-CM | POA: Diagnosis present

## 2023-07-02 DIAGNOSIS — N136 Pyonephrosis: Secondary | ICD-10-CM | POA: Diagnosis present

## 2023-07-02 DIAGNOSIS — I890 Lymphedema, not elsewhere classified: Secondary | ICD-10-CM | POA: Diagnosis present

## 2023-07-02 DIAGNOSIS — C680 Malignant neoplasm of urethra: Secondary | ICD-10-CM | POA: Diagnosis present

## 2023-07-02 DIAGNOSIS — T83090A Other mechanical complication of cystostomy catheter, initial encounter: Secondary | ICD-10-CM | POA: Diagnosis present

## 2023-07-02 DIAGNOSIS — Z8249 Family history of ischemic heart disease and other diseases of the circulatory system: Secondary | ICD-10-CM

## 2023-07-02 DIAGNOSIS — Z9221 Personal history of antineoplastic chemotherapy: Secondary | ICD-10-CM

## 2023-07-02 DIAGNOSIS — Z923 Personal history of irradiation: Secondary | ICD-10-CM

## 2023-07-02 DIAGNOSIS — F39 Unspecified mood [affective] disorder: Secondary | ICD-10-CM | POA: Diagnosis present

## 2023-07-02 LAB — CBC WITH DIFFERENTIAL/PLATELET
Abs Immature Granulocytes: 0.04 10*3/uL (ref 0.00–0.07)
Basophils Absolute: 0.1 10*3/uL (ref 0.0–0.1)
Basophils Relative: 1 %
Eosinophils Absolute: 0.1 10*3/uL (ref 0.0–0.5)
Eosinophils Relative: 1 %
HCT: 35.6 % — ABNORMAL LOW (ref 36.0–46.0)
Hemoglobin: 11.6 g/dL — ABNORMAL LOW (ref 12.0–15.0)
Immature Granulocytes: 0 %
Lymphocytes Relative: 15 %
Lymphs Abs: 1.6 10*3/uL (ref 0.7–4.0)
MCH: 31 pg (ref 26.0–34.0)
MCHC: 32.6 g/dL (ref 30.0–36.0)
MCV: 95.2 fL (ref 80.0–100.0)
Monocytes Absolute: 0.9 10*3/uL (ref 0.1–1.0)
Monocytes Relative: 8 %
Neutro Abs: 8.3 10*3/uL — ABNORMAL HIGH (ref 1.7–7.7)
Neutrophils Relative %: 75 %
Platelets: 330 10*3/uL (ref 150–400)
RBC: 3.74 MIL/uL — ABNORMAL LOW (ref 3.87–5.11)
RDW: 14 % (ref 11.5–15.5)
WBC: 10.9 10*3/uL — ABNORMAL HIGH (ref 4.0–10.5)
nRBC: 0 % (ref 0.0–0.2)

## 2023-07-02 LAB — URINALYSIS, ROUTINE W REFLEX MICROSCOPIC
Bilirubin Urine: NEGATIVE
Glucose, UA: NEGATIVE mg/dL
Ketones, ur: NEGATIVE mg/dL
Nitrite: POSITIVE — AB
Protein, ur: 30 mg/dL — AB
Specific Gravity, Urine: 1.004 — ABNORMAL LOW (ref 1.005–1.030)
pH: 9 — ABNORMAL HIGH (ref 5.0–8.0)

## 2023-07-02 LAB — COMPREHENSIVE METABOLIC PANEL WITH GFR
ALT: 17 U/L (ref 0–44)
AST: 19 U/L (ref 15–41)
Albumin: 3.8 g/dL (ref 3.5–5.0)
Alkaline Phosphatase: 79 U/L (ref 38–126)
Anion gap: 14 (ref 5–15)
BUN: 25 mg/dL — ABNORMAL HIGH (ref 8–23)
CO2: 19 mmol/L — ABNORMAL LOW (ref 22–32)
Calcium: 9.1 mg/dL (ref 8.9–10.3)
Chloride: 107 mmol/L (ref 98–111)
Creatinine, Ser: 2.29 mg/dL — ABNORMAL HIGH (ref 0.44–1.00)
GFR, Estimated: 23 mL/min — ABNORMAL LOW (ref >60)
Glucose, Bld: 92 mg/dL (ref 70–99)
Potassium: 3.3 mmol/L — ABNORMAL LOW (ref 3.5–5.1)
Sodium: 140 mmol/L (ref 135–145)
Total Bilirubin: 0.7 mg/dL (ref 0.0–1.2)
Total Protein: 7.6 g/dL (ref 6.5–8.1)

## 2023-07-02 MED ORDER — IPRATROPIUM-ALBUTEROL 0.5-2.5 (3) MG/3ML IN SOLN: 3.0000 mL | Freq: Four times a day (QID) | RESPIRATORY_TRACT | Status: DC | PRN

## 2023-07-02 MED ORDER — PIPERACILLIN-TAZOBACTAM 3.375 G IVPB 30 MIN
3.3750 g | Freq: Once | INTRAVENOUS | Status: AC
  Administered 2023-07-02: 3.375 g via INTRAVENOUS
  Filled 2023-07-02: qty 50

## 2023-07-02 MED ORDER — MELATONIN 3 MG PO TABS: 6.0000 mg | ORAL_TABLET | Freq: Every evening | ORAL | Status: DC | PRN

## 2023-07-02 MED ORDER — OXYCODONE HCL 5 MG PO TABS
5.0000 mg | ORAL_TABLET | ORAL | Status: DC | PRN
  Administered 2023-07-02 – 2023-07-06 (×8): 5 mg via ORAL
  Filled 2023-07-02 (×8): qty 1

## 2023-07-02 MED ORDER — ALBUTEROL SULFATE (2.5 MG/3ML) 0.083% IN NEBU: 2.5000 mg | INHALATION_SOLUTION | RESPIRATORY_TRACT | Status: DC | PRN

## 2023-07-02 MED ORDER — ACETAMINOPHEN 500 MG PO TABS
1000.0000 mg | ORAL_TABLET | Freq: Four times a day (QID) | ORAL | Status: DC | PRN
  Administered 2023-07-03: 1000 mg via ORAL
  Filled 2023-07-02: qty 2

## 2023-07-02 MED ORDER — ONDANSETRON HCL 4 MG/2ML IJ SOLN
4.0000 mg | Freq: Once | INTRAMUSCULAR | Status: DC
  Filled 2023-07-02: qty 2

## 2023-07-02 MED ORDER — PROCHLORPERAZINE EDISYLATE 10 MG/2ML IJ SOLN
10.0000 mg | Freq: Four times a day (QID) | INTRAMUSCULAR | Status: DC | PRN
  Administered 2023-07-03: 10 mg via INTRAVENOUS
  Filled 2023-07-02: qty 2

## 2023-07-02 MED ORDER — ONDANSETRON 4 MG PO TBDP
4.0000 mg | ORAL_TABLET | Freq: Once | ORAL | Status: AC
  Administered 2023-07-02: 4 mg via ORAL
  Filled 2023-07-02: qty 1

## 2023-07-02 MED ORDER — ONDANSETRON HCL 4 MG/2ML IJ SOLN
4.0000 mg | Freq: Once | INTRAMUSCULAR | Status: AC
  Administered 2023-07-02: 4 mg via INTRAVENOUS
  Filled 2023-07-02: qty 2

## 2023-07-02 MED ORDER — MORPHINE SULFATE (PF) 4 MG/ML IV SOLN
4.0000 mg | Freq: Once | INTRAVENOUS | Status: AC
  Administered 2023-07-02: 4 mg via INTRAMUSCULAR
  Filled 2023-07-02: qty 1

## 2023-07-02 MED ORDER — MORPHINE SULFATE (PF) 4 MG/ML IV SOLN
4.0000 mg | Freq: Once | INTRAVENOUS | Status: DC
  Filled 2023-07-02: qty 1

## 2023-07-02 MED ORDER — ATORVASTATIN CALCIUM 40 MG PO TABS
40.0000 mg | ORAL_TABLET | Freq: Every day | ORAL | Status: DC
  Administered 2023-07-03 – 2023-07-07 (×5): 40 mg via ORAL
  Filled 2023-07-02 (×5): qty 1

## 2023-07-02 MED ORDER — DILTIAZEM HCL ER COATED BEADS 120 MG PO CP24
120.0000 mg | ORAL_CAPSULE | Freq: Every day | ORAL | Status: DC
  Administered 2023-07-03 – 2023-07-07 (×5): 120 mg via ORAL
  Filled 2023-07-02 (×5): qty 1

## 2023-07-02 MED ORDER — PIPERACILLIN-TAZOBACTAM 3.375 G IVPB 30 MIN
3.3750 g | Freq: Four times a day (QID) | INTRAVENOUS | Status: DC
  Administered 2023-07-03 – 2023-07-04 (×5): 3.375 g via INTRAVENOUS
  Filled 2023-07-02 (×6): qty 50

## 2023-07-02 MED ORDER — OXYCODONE HCL 5 MG PO TABS: 2.5000 mg | ORAL_TABLET | ORAL | Status: DC | PRN

## 2023-07-02 MED ORDER — ONDANSETRON HCL 4 MG/2ML IJ SOLN: 4.0000 mg | Freq: Four times a day (QID) | INTRAMUSCULAR | Status: DC | PRN

## 2023-07-02 MED ORDER — HYDROMORPHONE HCL 1 MG/ML IJ SOLN
0.5000 mg | INTRAMUSCULAR | Status: DC | PRN
  Administered 2023-07-03 – 2023-07-05 (×4): 0.5 mg via INTRAVENOUS
  Filled 2023-07-02 (×5): qty 0.5

## 2023-07-02 MED ORDER — POTASSIUM CHLORIDE CRYS ER 20 MEQ PO TBCR
20.0000 meq | EXTENDED_RELEASE_TABLET | Freq: Once | ORAL | Status: AC
  Administered 2023-07-02: 20 meq via ORAL
  Filled 2023-07-02: qty 1

## 2023-07-02 MED ORDER — ARIPIPRAZOLE 10 MG PO TABS
30.0000 mg | ORAL_TABLET | Freq: Every day | ORAL | Status: DC
  Administered 2023-07-03 – 2023-07-07 (×5): 30 mg via ORAL
  Filled 2023-07-02 (×2): qty 6
  Filled 2023-07-02: qty 3
  Filled 2023-07-02 (×2): qty 6
  Filled 2023-07-02: qty 3

## 2023-07-02 MED ORDER — PANTOPRAZOLE SODIUM 40 MG PO TBEC: 40.0000 mg | DELAYED_RELEASE_TABLET | Freq: Every day | ORAL | Status: DC | PRN

## 2023-07-02 MED ORDER — SODIUM CHLORIDE 0.9 % IV BOLUS
500.0000 mL | Freq: Once | INTRAVENOUS | Status: AC
  Administered 2023-07-02: 500 mL via INTRAVENOUS

## 2023-07-02 MED ORDER — DULOXETINE HCL 30 MG PO CPEP
30.0000 mg | ORAL_CAPSULE | Freq: Every day | ORAL | Status: DC
  Administered 2023-07-03 – 2023-07-07 (×5): 30 mg via ORAL
  Filled 2023-07-02 (×5): qty 1

## 2023-07-02 NOTE — Progress Notes (Signed)
 ED Pharmacy Antibiotic Sign Off An antibiotic consult was received from an ED provider for Zosyn  per pharmacy dosing for complicated UTI. A chart review was completed to assess appropriateness.   The following one time order(s) were placed:  Zosyn  3.375g IV Q8H infused over 30 minutes.   Further antibiotic and/or antibiotic pharmacy consults should be ordered by the admitting provider if indicated.   Thank you for allowing pharmacy to be a part of this patient's care.   Kendall Pauls PharmD, BCPS [facility] main pharmacy [phone] 07/02/2023 8:54 PM

## 2023-07-02 NOTE — ED Triage Notes (Signed)
 Patient POV from home. Patient states she began to have left side flank pain at 2200 yesterday that radiated toward the left side of abdomen. Patient has urostomy bag and states it has not collected much urine. 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 Patient states that the stents are replaced every 5 weeks and is sue to be changed this Thursday. Patient has taken oxycodone  last night for pain. Patients this has happened in the past.

## 2023-07-02 NOTE — H&P (Signed)
 History and Physical    Shelia Chan ZOX:[id number] DOB: 1955/03/12 DOA: 07/02/2023  PCP: Ulysees Gander, MD   Patient coming from: Home   Chief Complaint:  Chief Complaint  Patient presents with   Abdominal Pain    HPI:  Shelia Chan is a 68 y.o. female with hx of SCC of urethra s/p chemo/radiation, cystectomy and urethrectomy, LN dissection, ileal conduit, bilateral nephroureteral stents with routine stent exchanges by IR, severe L hydronephrosis, CKD stage IV, HFpEF, A tach, COPD / asthma, Lymphedema, who presents with acute onset of left flank pain. Reports acute onset at 10 PM yesterday evening and presented due to persistence of pain. Associated nausea and vomiting x few episodes, nonbloody, nonbilious. Did have watery stool over past few days as well. Some Left upper abd pain. Deneis any chronic flank pain, reports similar pain with UTI in the past and improvement after antibiotics. No Fever or chills. Had received her initial oncology care in [facility] and moved to [place] about 5 years ago, has seen Dr. Fern Hover of Oncology last in '23 and follows with Dr. Valeta Gaudier of Urology.    Review of Systems:  ROS complete and negative except as marked above   Allergies  Allergen Reactions   Metoprolol  Shortness Of Breath   Shellfish-Derived Products Other (See Comments)    Prior to Admission medications   Medication Sig Start Date End Date Taking? Authorizing Provider  acetaminophen  (TYLENOL ) 500 MG tablet Take 500-1,000 mg by mouth every 6 (six) hours as needed for moderate pain or fever.    [provider]  albuterol  (VENTOLIN  HFA) 108 (90 Base) MCG/ACT inhaler Inhale 1 puff into the lungs every 6 (six) hours as needed for shortness of breath. 05/22/20   [provider]  ARIPiprazole  (ABILIFY ) 30 MG tablet Take 30 mg by mouth daily. 03/19/19   [provider]  aspirin  EC 81 MG tablet Take 81 mg by mouth daily.    [provider]  atorvastatin   (LIPITOR) 40 MG tablet Take 1 tablet daily at bedtime. 03/17/22     B Complex-C (B-COMPLEX WITH VITAMIN C ) tablet Take 1 tablet by mouth daily.    [provider]  calcitRIOL  (ROCALTROL ) 0.25 MCG capsule Take 0.25 mcg by mouth daily. 02/12/21   [provider]  Cholecalciferol  (VITAMIN D -3) 25 MCG (1000 UT) CAPS Take 2,000 Units by mouth daily.    [provider]  denosumab  (PROLIA ) 60 MG/ML SOSY injection Inject 60 mg [place] every 6 months 05/23/22     ferrous sulfate  324 MG TBEC Take 324 mg by mouth daily with breakfast.    [provider]  furosemide  (LASIX ) 40 MG tablet Take 40 mg by mouth daily. 10/13/22   [provider]  oxyCODONE  (OXY IR/ROXICODONE ) 5 MG immediate release tablet Take 1 tablet (5 mg total) by mouth every 6 (six) hours as needed for severe pain 12/02/22     oxyCODONE  (OXY IR/ROXICODONE ) 5 MG immediate release tablet take 1 tablet (5 mg) by oral route every 6 hours as needed for severe pain 01/02/23     oxyCODONE  (OXY IR/ROXICODONE ) 5 MG immediate release tablet Take 1 tablet (5 mg total) by mouth every 6 (six) hours as needed for severe pain 05/01/23     oxyCODONE  (OXY IR/ROXICODONE ) 5 MG immediate release tablet Take 1 tablet (5 mg total) by mouth every 6 (six) hours as needed for severe pain 06/01/23     pantoprazole  (PROTONIX ) 40 MG tablet Take 40 mg  by mouth daily as needed (acid reflux). 01/28/21   [provider]  potassium chloride  (KLOR-CON ) 20 MEQ packet Take 20 mEq by mouth daily as needed (when blood results indicate low potassium).    [provider]  vitamin C  (ASCORBIC ACID ) 500 MG tablet Take 500 mg by mouth daily.    [provider]    Past Medical History:  Diagnosis Date   Arthritis    Asthma    Blood transfusion without reported diagnosis    Cancer (HCC)    CHF (congestive heart failure) (HCC)    COPD (chronic obstructive pulmonary disease) (HCC)    Heart murmur    Immune deficiency disorder  (HCC)    Renal disorder     Past Surgical History:  Procedure Laterality Date   ABDOMINAL HYSTERECTOMY     BLADDER REMOVAL  118/22/2015   FRACTURE SURGERY Left 02/2018   pins   IR CATHETER TUBE CHANGE  04/15/2019   IR CATHETER TUBE CHANGE  04/15/2019   IR EXT NEPHROURETERAL CATH EXCHANGE  07/15/2019   IR EXT NEPHROURETERAL CATH EXCHANGE  10/14/2019   IR EXT NEPHROURETERAL CATH EXCHANGE  10/14/2019   IR EXT NEPHROURETERAL CATH EXCHANGE  01/06/2020   IR EXT NEPHROURETERAL CATH EXCHANGE  03/16/2020   IR EXT NEPHROURETERAL CATH EXCHANGE  05/25/2020   IR EXT NEPHROURETERAL CATH EXCHANGE  07/17/2020   IR EXT NEPHROURETERAL CATH EXCHANGE  07/17/2020   IR EXT NEPHROURETERAL CATH EXCHANGE  08/14/2020   IR EXT NEPHROURETERAL CATH EXCHANGE  09/25/2020   IR EXT NEPHROURETERAL CATH EXCHANGE  11/06/2020   IR EXT NEPHROURETERAL CATH EXCHANGE  12/25/2020   IR EXT NEPHROURETERAL CATH EXCHANGE  02/09/2021   IR EXT NEPHROURETERAL CATH EXCHANGE  03/19/2021   IR EXT NEPHROURETERAL CATH EXCHANGE  03/19/2021   IR EXT NEPHROURETERAL CATH EXCHANGE  04/23/2021   IR EXT NEPHROURETERAL CATH EXCHANGE  04/23/2021   IR EXT NEPHROURETERAL CATH EXCHANGE  06/04/2021   IR EXT NEPHROURETERAL CATH EXCHANGE  07/15/2021   IR EXT NEPHROURETERAL CATH EXCHANGE  09/02/2021   IR EXT NEPHROURETERAL CATH EXCHANGE  10/14/2021   IR EXT NEPHROURETERAL CATH EXCHANGE  11/25/2021   IR EXT NEPHROURETERAL CATH EXCHANGE  01/06/2022   IR EXT NEPHROURETERAL CATH EXCHANGE  02/17/2022   IR EXT NEPHROURETERAL CATH EXCHANGE  03/31/2022   IR EXT NEPHROURETERAL CATH EXCHANGE  05/12/2022   IR EXT NEPHROURETERAL CATH EXCHANGE  06/30/2022   IR EXT NEPHROURETERAL CATH EXCHANGE  08/18/2022   IR EXT NEPHROURETERAL CATH EXCHANGE  10/06/2022   IR EXT NEPHROURETERAL CATH EXCHANGE  11/24/2022   IR EXT NEPHROURETERAL CATH EXCHANGE  02/09/2023   IR EXT NEPHROURETERAL CATH EXCHANGE  03/23/2023   IR EXT NEPHROURETERAL CATH EXCHANGE  04/26/2023   IR  EXT NEPHROURETERAL CATH EXCHANGE  06/01/2023   IR NEPHROSTOMY EXCHANGE LEFT  12/30/2022   IR NEPHROSTOMY EXCHANGE RIGHT  12/30/2022   IR PERC TUN PERIT CATH WO PORT S&I /IMAG  03/25/2021   IR REMOVAL TUN CV CATH W/O FL  06/04/2021     reports that she has been smoking cigarettes and e-cigarettes. She started smoking about 54 years ago. She has a 50 pack-year smoking history. She has never used smokeless tobacco. She reports that she does not currently use alcohol. She reports current drug use. Drug: Marijuana.  Family History  Problem Relation Age of Onset   Heart disease Maternal Grandfather      Physical Exam: Vitals:   07/02/23 1900 07/02/23 1930 07/02/23 2000 07/02/23  2030  BP: (!) 109/58 107/64 (!) 110/58 116/77  Pulse: 72 73 69 72  Resp:      Temp:      TempSrc:      SpO2: 97% 96% 97% 98%    Gen: Awake, alert, NAD   CV: Regular, normal S1, S2, 1/6 SEM at RUSB  Resp: Normal WOB, CTAB  Abd: Flat, normoactive, nontender, ostomy with clear yellow urine output.  MSK: Symmetric, 3+ woody hard edema  Skin: No rashes or lesions to exposed skin  Neuro: Alert and interactive  Psych: euthymic, appropriate    Data review:   Labs reviewed, notable for:   K 3.3  Bicarb 19, AG 14  Cr 2.29 similar to prior  WBC 10.9, slightly up   Micro:  Results for orders placed or performed during the hospital encounter of 07/24/22  Culture, blood (routine x 2)     Status: None   Collection Time: 07/24/22 10:14 PM   Specimen: BLOOD RIGHT HAND  Result Value Ref Range Status   Specimen Description   Final    BLOOD RIGHT HAND Performed at [facility], [address] [place], [place] [address]    Special Requests   Final    BOTTLES DRAWN AEROBIC AND ANAEROBIC Blood Culture results may not be optimal due to an inadequate volume of blood received in culture bottles Performed at [facility], [address] [place], [place] [address]    Culture   Final    NO  GROWTH 5 DAYS Performed at [facility], [address] [place], [place] [address]    Report Status 07/30/2022 FINAL  Final  Culture, blood (routine x 2)     Status: None   Collection Time: 07/25/22  1:08 AM   Specimen: BLOOD RIGHT ARM  Result Value Ref Range Status   Specimen Description   Final    BLOOD RIGHT ARM Performed at [facility], [address], [place], [place] [address]    Special Requests   Final    Blood Culture adequate volume BOTTLES DRAWN AEROBIC AND ANAEROBIC Performed at [facility], [address], [place], [place] [address]    Culture   Final    NO GROWTH 5 DAYS Performed at [facility], [address], [place], [place] [address]    Report Status 07/30/2022 FINAL  Final  Urine Culture (for pregnant, neutropenic or urologic patients or patients with an indwelling urinary catheter)     Status: Abnormal   Collection Time: 07/25/22  9:11 AM   Specimen: Urine, Suprapubic  Result Value Ref Range Status   Specimen Description   Final    URINE, SUPRAPUBIC Performed at [facility], [address] [place], [place] [address]    Special Requests   Final    NONE Performed at [facility], [address] [place], [place] [address]    Culture MULTIPLE SPECIES PRESENT, SUGGEST RECOLLECTION (A)  Final   Report Status 07/27/2022 FINAL  Final  C Difficile Quick Screen w PCR reflex     Status: None   Collection Time: 07/25/22 11:49 AM   Specimen: STOOL  Result Value Ref Range Status   C Diff antigen NEGATIVE NEGATIVE Final   C Diff toxin NEGATIVE NEGATIVE Final   C Diff interpretation No C. difficile detected.  Final    Comment: Performed at [facility], [address] [place], [place] [address]  Gastrointestinal Panel by PCR , Stool     Status: Abnormal   Collection Time: 07/25/22 11:49 AM   Specimen: STOOL  Result Value Ref Range Status   Campylobacter species  DETECTED (A) NOT DETECTED Final    Comment: RESULT CALLED TO, READ BACK BY AND VERIFIED WITH: [place] 1212 07/26/22 MU    Plesimonas shigelloides NOT DETECTED NOT DETECTED Final   Salmonella species NOT DETECTED NOT DETECTED Final   Yersinia enterocolitica NOT DETECTED NOT DETECTED Final   Vibrio species NOT DETECTED NOT DETECTED Final   Vibrio cholerae NOT DETECTED NOT DETECTED Final   Enteroaggregative E coli (EAEC) NOT DETECTED NOT DETECTED Final   Enteropathogenic E coli (EPEC) NOT DETECTED NOT DETECTED Final   Enterotoxigenic E coli (ETEC) NOT DETECTED NOT DETECTED Final   Shiga like toxin producing E coli (STEC) NOT DETECTED NOT DETECTED Final   Shigella/Enteroinvasive E coli (EIEC) NOT DETECTED NOT DETECTED Final   Cryptosporidium NOT DETECTED NOT DETECTED Final   Cyclospora cayetanensis NOT DETECTED NOT DETECTED Final   Entamoeba histolytica NOT DETECTED NOT DETECTED Final   Giardia lamblia NOT DETECTED NOT DETECTED Final   Adenovirus F40/41 NOT DETECTED NOT DETECTED Final   Astrovirus NOT DETECTED NOT DETECTED Final   Norovirus GI/GII NOT DETECTED NOT DETECTED Final   Rotavirus A NOT DETECTED NOT DETECTED Final   Sapovirus (I, II, IV, and V) NOT DETECTED NOT DETECTED Final    Comment: Performed at [facility], [address] [place], [place] [address]    Imaging reviewed:  CT ABDOMEN PELVIS WO CONTRAST Result Date: 07/02/2023 CLINICAL DATA:  Abdominal pain, acute, nonlocalized. Left-sided flank pain radiating toward left side of abdomen. Urostomy bag with decreased output. EXAM: CT ABDOMEN AND PELVIS WITHOUT CONTRAST TECHNIQUE: Multidetector CT imaging of the abdomen and pelvis was performed following the standard protocol without IV contrast. RADIATION DOSE REDUCTION: This exam was performed according to the departmental dose-optimization program which includes automated exposure control, adjustment of the mA and/or kV according to patient size and/or use of  iterative reconstruction technique. COMPARISON:  12/29/2022. FINDINGS: Lower chest: No acute abnormality. Hepatobiliary: Scattered ill-defined hypodensities are present in the liver measuring up to 2.7 cm, suspicious for metastatic disease. No biliary ductal dilatation is seen. The gallbladder is without stones. Pancreas: Unremarkable. No pancreatic ductal dilatation or surrounding inflammatory changes. Spleen: Normal in size without focal abnormality. Adrenals/Urinary Tract: The adrenal glands are within normal limits. There is severe hydronephrosis on the left with renal cortical thinning. Ureteral stents are noted bilaterally in terminating in a right lower quadrant ileal conduit and ostomy. No renal or ureteral calculus is seen. Peri ureteral fat stranding is present on the left, similar in appearance to the prior exam. The bladder is surgically absent. Stomach/Bowel: The stomach is within normal limits. The appendix is not seen. There are mildly distended loops of bowel in the right lower quadrant measuring up to 3.5 cm adjacent to the ileal conduit. No definite transition 0.18, pneumatosis, or free air is seen. Vascular/Lymphatic: Aortic atherosclerosis. No abdominal or pelvic lymphadenopathy is seen. Reproductive: Status post hysterectomy. No adnexal masses. Other: There is no free fluid in the pelvis. Musculoskeletal: Fixation hardware is noted in the proximal left femur. Degenerative changes are noted in the thoracolumbar spine. Sacral iliac attic an pubic symphysis sclerosis and fragmentation are unchanged. IMPRESSION: 1. Severe left hydronephrosis with renal cortical thinning unchanged. Bilateral ureteral stents are noted with right lower quadrant ileal conduit and ostomy. 2. Mildly distended loops  of bowel in the right lower quadrant adjacent to the ostomy measuring up to 3.5 cm, possibly representing small bowel versus ileal conduit. Repeat evaluation with oral contrast may be beneficial for further  evaluation. 3. Multiple hypodensities in the liver measuring up to 2.7 cm, suspicious for metastatic disease. Multiphase CT or MRI is recommended for further evaluation. 4. Aortic atherosclerosis. Electronically Signed   By: Wyvonnia Heimlich M.D.   On: 07/02/2023 16:34    Reviewed CT A/P:     ED Course:  EDP discussed with Urology re: flank pain and patient concern for obstruction, findings benign on their review. RFA for possible complicated UTI, ordered for zosyn . Otherwise treated with zofran , morphine      Assessment/Plan:  68 y.o. female with hx SCC of urethra s/p chemo/radiation, cystectomy and urethrectomy, LN dissection, ileal conduit, bilateral nephroureteral stents with routine stent exchanges by IR, severe L hydronephrosis, CKD stage IV, HFpEF, A tach, COPD / asthma, Lymphedema, who presents with acute onset of left flank pain. Brought in for observation for suspected complicated UTI   Complicated UTI Hx of Ileal conduit  Acute L flank pain with associated N/V x 1 day. Afebrile, VS wnl. WBC 10.9. UA findings equivocal in setting of ileal conduit, few bacteria, pyuria, leuk esterase, nitrites seen. Ucx sent. Review of prior micro with ESBL E coli sp although Zosyn  sensitive, and pan-S pseudomonas most recently.  -Continue Zosyn  3375 mg IV q 8 hr  -Follow-up urine culture -Give 500 cc NS then continue oral hydration -Symptomatic management Zofran /Compazine  as needed; pain control Tylenol  as needed mild, oxycodone  2.5/5 mg for moderate/severe, Dilaudid  0.5 mg IV every 4 hours as needed for breakthrough  Multiple hypodense liver lesions, concern for metastatic disease  Hx SCC urethra s/p chemo/radiation, cystectomy and urethrectomy, LN dissection, ileal conduit, bilateral nephroureteral stents with routine stent exchanges by IR -MRI liver with and without contrast ordered -Follows with Dr. Valeta Gaudier urology, Dr. Fern Hover Oncology.   Chronic medical problems: CKD stage IV, severe left  hydronephrosis: Findings stable on CT. Baseline Cr ~ 2.2  HFpEF: Without acute exacerbation.  Holding Lasix  for now with volume depletion.  A. tach: Continue on diltiazem  120 mg daily COPD/asthma: DuoNeb as needed Lymphedema: holding lasix .  HLD: Continue home atorvastatin  Mood d/o: Continue home aripiprazole , duloxetine .  There is no height or weight on file to calculate BMI.    DVT prophylaxis:  SCDs Code Status:  DNR/DNI(Do NOT Intubate); confirmed with patient  Diet:  Diet Orders (From admission, onward)     Start     Ordered   07/02/23 2042  Diet regular Room service appropriate? Yes; Fluid consistency: Thin  Diet effective now       Question Answer Comment  Room service appropriate? Yes   Fluid consistency: Thin      07/02/23 2042           Family Communication:  None   Consults:  None   Admission status:   Observation, Med-Surg  Severity of Illness: The appropriate patient status for this patient is OBSERVATION. Observation status is judged to be reasonable and necessary in order to provide the required intensity of service to ensure the patient's safety. The patient's presenting symptoms, physical exam findings, and initial radiographic and laboratory data in the context of their medical condition is felt to place them at decreased risk for further clinical deterioration. Furthermore, it is anticipated that the patient will be medically stable for discharge from the hospital within 2 midnights of admission.  Arnulfo Larch, MD [facility]  How to contact the [facility] Attending or Consulting provider 7A - 7P or covering provider during after hours 7P -7A, for this patient.  Check the care team in [facility] and look for a) attending/consulting [facility] provider listed and b) the [facility] team listed Log into [email or web address] and use [place]'s universal password to access. If you do not have the password, please contact the hospital operator. Locate the [facility] provider you are  looking for under [facility] and page to a number that you can be directly reached. If you still have difficulty reaching the provider, please page the [facility] (Director on Call) for the Hospitalists listed on amion for assistance.  07/02/2023, 8:47 PM

## 2023-07-02 NOTE — ED Provider Notes (Signed)
 [place] EMERGENCY DEPARTMENT AT [facility] Provider Note   CSN: [id number] Arrival date & time: 07/02/23  1225     Patient presents with: Abdominal Pain   Shelia Chan is a 68 y.o. female with history of bladder cancer, CHF, stage IV CKD, and COPD presents the ED today for abdominal pain.  Patient reports that about 10 PM last night she started having left-sided abdominal pain that radiates to the back with associated nausea and decreased urine output.  Has bilateral nephroureteral stents since her cystectomy in 2015.  Patient reports she gets her stent changed every 5 weeks, she is due to get them exchanged in 6 days.  She had an obstruction in her left ureteral stent in the past and states that her pain today feels similar.  No fevers, vomiting, or changes to bowel habits.  She is eating and drinking normally.    Prior to Admission medications   Medication Sig Start Date End Date Taking? Authorizing Provider  acetaminophen  (TYLENOL ) 500 MG tablet Take 500-1,000 mg by mouth every 6 (six) hours as needed for moderate pain or fever.    [provider]  albuterol  (VENTOLIN  HFA) 108 (90 Base) MCG/ACT inhaler Inhale 1 puff into the lungs every 6 (six) hours as needed for shortness of breath. 05/22/20   [provider]  ARIPiprazole  (ABILIFY ) 30 MG tablet Take 30 mg by mouth daily. 03/19/19   [provider]  aspirin  EC 81 MG tablet Take 81 mg by mouth daily.    [provider]  atorvastatin  (LIPITOR) 40 MG tablet Take 1 tablet daily at bedtime. 03/17/22     B Complex-C (B-COMPLEX WITH VITAMIN C ) tablet Take 1 tablet by mouth daily.    [provider]  calcitRIOL  (ROCALTROL ) 0.25 MCG capsule Take 0.25 mcg by mouth daily. 02/12/21   [provider]  Cholecalciferol  (VITAMIN D -3) 25 MCG (1000 UT) CAPS Take 2,000 Units by mouth daily.    [provider]  denosumab  (PROLIA ) 60 MG/ML SOSY injection Inject 60 mg [place] every 6  months 05/23/22     ferrous sulfate  324 MG TBEC Take 324 mg by mouth daily with breakfast.    [provider]  furosemide  (LASIX ) 40 MG tablet Take 40 mg by mouth daily. 10/13/22   [provider]  oxyCODONE  (OXY IR/ROXICODONE ) 5 MG immediate release tablet Take 1 tablet (5 mg total) by mouth every 6 (six) hours as needed for severe pain 12/02/22     oxyCODONE  (OXY IR/ROXICODONE ) 5 MG immediate release tablet take 1 tablet (5 mg) by oral route every 6 hours as needed for severe pain 01/02/23     oxyCODONE  (OXY IR/ROXICODONE ) 5 MG immediate release tablet Take 1 tablet (5 mg total) by mouth every 6 (six) hours as needed for severe pain 05/01/23     oxyCODONE  (OXY IR/ROXICODONE ) 5 MG immediate release tablet Take 1 tablet (5 mg total) by mouth every 6 (six) hours as needed for severe pain 06/01/23     pantoprazole  (PROTONIX ) 40 MG tablet Take 40 mg by mouth daily as needed (acid reflux). 01/28/21   [provider]  potassium chloride  (KLOR-CON ) 20 MEQ packet Take 20 mEq by mouth daily as needed (when blood results indicate low potassium).    [provider]  vitamin C  (ASCORBIC ACID ) 500 MG tablet Take 500 mg by mouth daily.    [provider]    Allergies: Metoprolol  and Shellfish-derived products    Review of Systems  Genitourinary:  Positive for flank pain.  All other systems reviewed and are negative.   Updated Vital Signs BP 116/77   Pulse 72   Temp 98.2 F (36.8 C) (Oral)   Resp 16   SpO2 98%   Physical Exam Vitals and nursing note reviewed.  Constitutional:      Appearance: Normal appearance.  HENT:     Head: Normocephalic and atraumatic.     Mouth/Throat:     Mouth: Mucous membranes are moist.   Eyes:     Conjunctiva/sclera: Conjunctivae normal.     Pupils: Pupils are equal, round, and reactive to light.    Cardiovascular:     Rate and Rhythm: Normal rate and regular rhythm.     Pulses: Normal pulses.     Heart sounds: Normal  heart sounds.  Pulmonary:     Effort: Pulmonary effort is normal.     Breath sounds: Normal breath sounds.  Abdominal:     Palpations: Abdomen is soft.     Tenderness: There is abdominal tenderness.     Comments: Left-sided abdominal tenderness as well as left flank tenderness.  Ostomy bag present at the suprapubic location.   Musculoskeletal:        General: Normal range of motion.     Cervical back: Normal range of motion.   Skin:    General: Skin is warm and dry.     Findings: No rash.   Neurological:     General: No focal deficit present.     Mental Status: She is alert.   Psychiatric:        Mood and Affect: Mood normal.        Behavior: Behavior normal.    (all labs ordered are listed, but only abnormal results are displayed) Labs Reviewed  URINALYSIS, ROUTINE W REFLEX MICROSCOPIC - Abnormal; Notable for the following components:      Result Value   APPearance HAZY (*)    Specific Gravity, Urine 1.004 (*)    pH 9.0 (*)    Hgb urine dipstick SMALL (*)    Protein, ur 30 (*)    Nitrite POSITIVE (*)    Leukocytes,Ua LARGE (*)    Bacteria, UA FEW (*)    All other components within normal limits  CBC WITH DIFFERENTIAL/PLATELET - Abnormal; Notable for the following components:   WBC 10.9 (*)    RBC 3.74 (*)    Hemoglobin 11.6 (*)    HCT 35.6 (*)    Neutro Abs 8.3 (*)    All other components within normal limits  COMPREHENSIVE METABOLIC PANEL WITH GFR - Abnormal; Notable for the following components:   Potassium 3.3 (*)    CO2 19 (*)    BUN 25 (*)    Creatinine, Ser 2.29 (*)    GFR, Estimated 23 (*)    All other components within normal limits  URINE CULTURE  BASIC METABOLIC PANEL WITH GFR  CBC  MAGNESIUM   PHOSPHORUS    EKG: None  Radiology: CT ABDOMEN PELVIS WO CONTRAST Result Date: 07/02/2023 CLINICAL DATA:  Abdominal pain, acute, nonlocalized. Left-sided flank pain radiating toward left side of abdomen. Urostomy bag with decreased output. EXAM: CT  ABDOMEN AND PELVIS WITHOUT CONTRAST TECHNIQUE: Multidetector CT imaging of the abdomen and pelvis was performed following the standard protocol without IV contrast. RADIATION DOSE REDUCTION: This exam was performed according to the departmental dose-optimization program which includes automated exposure control, adjustment of the mA and/or kV according to patient size and/or use of  iterative reconstruction technique. COMPARISON:  12/29/2022. FINDINGS: Lower chest: No acute abnormality. Hepatobiliary: Scattered ill-defined hypodensities are present in the liver measuring up to 2.7 cm, suspicious for metastatic disease. No biliary ductal dilatation is seen. The gallbladder is without stones. Pancreas: Unremarkable. No pancreatic ductal dilatation or surrounding inflammatory changes. Spleen: Normal in size without focal abnormality. Adrenals/Urinary Tract: The adrenal glands are within normal limits. There is severe hydronephrosis on the left with renal cortical thinning. Ureteral stents are noted bilaterally in terminating in a right lower quadrant ileal conduit and ostomy. No renal or ureteral calculus is seen. Peri ureteral fat stranding is present on the left, similar in appearance to the prior exam. The bladder is surgically absent. Stomach/Bowel: The stomach is within normal limits. The appendix is not seen. There are mildly distended loops of bowel in the right lower quadrant measuring up to 3.5 cm adjacent to the ileal conduit. No definite transition 0.18, pneumatosis, or free air is seen. Vascular/Lymphatic: Aortic atherosclerosis. No abdominal or pelvic lymphadenopathy is seen. Reproductive: Status post hysterectomy. No adnexal masses. Other: There is no free fluid in the pelvis. Musculoskeletal: Fixation hardware is noted in the proximal left femur. Degenerative changes are noted in the thoracolumbar spine. Sacral iliac attic an pubic symphysis sclerosis and fragmentation are unchanged. IMPRESSION: 1. Severe  left hydronephrosis with renal cortical thinning unchanged. Bilateral ureteral stents are noted with right lower quadrant ileal conduit and ostomy. 2. Mildly distended loops of bowel in the right lower quadrant adjacent to the ostomy measuring up to 3.5 cm, possibly representing small bowel versus ileal conduit. Repeat evaluation with oral contrast may be beneficial for further evaluation. 3. Multiple hypodensities in the liver measuring up to 2.7 cm, suspicious for metastatic disease. Multiphase CT or MRI is recommended for further evaluation. 4. Aortic atherosclerosis. Electronically Signed   By: Wyvonnia Heimlich M.D.   On: 07/02/2023 16:34     Procedures   Medications Ordered in the ED  acetaminophen  (TYLENOL ) tablet 1,000 mg (has no administration in time range)  oxyCODONE  (Oxy IR/ROXICODONE ) immediate release tablet 2.5 mg (has no administration in time range)    Or  oxyCODONE  (Oxy IR/ROXICODONE ) immediate release tablet 5 mg (has no administration in time range)  HYDROmorphone  (DILAUDID ) injection 0.5 mg (has no administration in time range)  ondansetron  (ZOFRAN ) injection 4 mg (has no administration in time range)  melatonin tablet 6 mg (has no administration in time range)  albuterol  (PROVENTIL ) (2.5 MG/3ML) 0.083% nebulizer solution 2.5 mg (has no administration in time range)  prochlorperazine  (COMPAZINE ) injection 10 mg (has no administration in time range)  morphine  (PF) 4 MG/ML injection 4 mg (4 mg Intramuscular Given 07/02/23 1446)  ondansetron  (ZOFRAN -ODT) disintegrating tablet 4 mg (4 mg Oral Given 07/02/23 1446)  ondansetron  (ZOFRAN ) injection 4 mg (4 mg Intravenous Given 07/02/23 1939)                                    Medical Decision Making Amount and/or Complexity of Data Reviewed Labs: ordered. Radiology: ordered.  Risk Prescription drug management.   This patient presents to the ED for concern of abdominal pain, this involves an extensive number of treatment  options, and is a complaint that carries with it a high risk of complications and morbidity.   Differential diagnosis includes: UTI, pyelonephritis, ureteral obstruction, bowel obstruction, colitis, perforation, etc.   Comorbidities  See HPI above   Additional History  Additional history obtained from prior records   Lab Tests  I ordered and personally interpreted labs.  The pertinent results include:   BUN 25, creatinine 2.29, GFR 23 otherwise CMP is reassuring Slightly elevated WBC of 10.9 UA shows large leukocytes, positive for nitrites, small hemoglobin, few bacteria, and budding yeast.  Culture pending.   Imaging Studies  I ordered imaging studies including CT abdomen/pelvis  I independently visualized and interpreted imaging which showed:  Left hydronephrosis with renal cortical thinning - unchanged.  Bilateral ureteral stents are noted with lower quadrant ileal conduit ostomy. Mildly distended loops of bowel and right lower quadrant adjacent to the ostomy measuring up to 3.5 cm, possibly representing small bowel versus ileal conduit. Multiple hypodensities in the liver measuring up to 2.7 cm, suspicious for metastatic disease. I agree with the radiologist interpretation   Consultations  I requested consultation with Dr. Cherylene Corrente with urology,  and discussed lab and imaging findings as well as pertinent plan - they recommend: the findings of the bowel in the CT are benign I requested consultation with Dr. Amy Kansky  with [facility],  and discussed lab and imaging findings as well as pertinent plan - they recommend: admission.   Problem List / ED Course / Critical Interventions / Medication Management  Patient reports left-sided abdominal and flank pain that began around 10 PM last night has been worsening since onset.  Reports that she has a history of obstructions of her stents in the past and thinks that this could be happening again.   She follows with [facility]. Supposed to have her stents replaced in the next 4 days. Endorses decreased urinary output as well as nausea.  No vomiting or changes to bowel habits.  No right-sided abdominal pain. I ordered medications including: Morphine  for pain Zofran  for nausea  Reevaluation of the patient after these medicines showed that the patient improved I have reviewed the patients home medicines and have made adjustments as needed   Social Determinants of Health  Tobacco use   Test / Admission - Considered  Discussed findings with patient.  She is appropriate for admission.    Final diagnoses:  Complicated UTI (urinary tract infection)    ED Discharge Orders     None          Sonnie Dusky, PA-C 07/02/23 2043    Deatra Face, MD 07/03/23 2035

## 2023-07-03 ENCOUNTER — Observation Stay (HOSPITAL_COMMUNITY)

## 2023-07-03 DIAGNOSIS — Z9889 Other specified postprocedural states: Secondary | ICD-10-CM | POA: Diagnosis not present

## 2023-07-03 DIAGNOSIS — C7951 Secondary malignant neoplasm of bone: Secondary | ICD-10-CM | POA: Diagnosis present

## 2023-07-03 DIAGNOSIS — T83598A Infection and inflammatory reaction due to other prosthetic device, implant and graft in urinary system, initial encounter: Secondary | ICD-10-CM | POA: Diagnosis present

## 2023-07-03 DIAGNOSIS — D518 Other vitamin B12 deficiency anemias: Secondary | ICD-10-CM | POA: Diagnosis not present

## 2023-07-03 DIAGNOSIS — I4719 Other supraventricular tachycardia: Secondary | ICD-10-CM | POA: Diagnosis present

## 2023-07-03 DIAGNOSIS — N390 Urinary tract infection, site not specified: Secondary | ICD-10-CM | POA: Diagnosis present

## 2023-07-03 DIAGNOSIS — Y838 Other surgical procedures as the cause of abnormal reaction of the patient, or of later complication, without mention of misadventure at the time of the procedure: Secondary | ICD-10-CM | POA: Diagnosis present

## 2023-07-03 DIAGNOSIS — Z85828 Personal history of other malignant neoplasm of skin: Secondary | ICD-10-CM | POA: Diagnosis not present

## 2023-07-03 DIAGNOSIS — R160 Hepatomegaly, not elsewhere classified: Secondary | ICD-10-CM | POA: Diagnosis not present

## 2023-07-03 DIAGNOSIS — E876 Hypokalemia: Secondary | ICD-10-CM | POA: Diagnosis present

## 2023-07-03 DIAGNOSIS — Z66 Do not resuscitate: Secondary | ICD-10-CM | POA: Diagnosis present

## 2023-07-03 DIAGNOSIS — C680 Malignant neoplasm of urethra: Secondary | ICD-10-CM | POA: Diagnosis not present

## 2023-07-03 DIAGNOSIS — Z936 Other artificial openings of urinary tract status: Secondary | ICD-10-CM | POA: Diagnosis not present

## 2023-07-03 DIAGNOSIS — C787 Secondary malignant neoplasm of liver and intrahepatic bile duct: Secondary | ICD-10-CM | POA: Diagnosis present

## 2023-07-03 DIAGNOSIS — D631 Anemia in chronic kidney disease: Secondary | ICD-10-CM | POA: Diagnosis present

## 2023-07-03 DIAGNOSIS — E785 Hyperlipidemia, unspecified: Secondary | ICD-10-CM | POA: Diagnosis present

## 2023-07-03 DIAGNOSIS — F39 Unspecified mood [affective] disorder: Secondary | ICD-10-CM | POA: Diagnosis present

## 2023-07-03 DIAGNOSIS — Z8249 Family history of ischemic heart disease and other diseases of the circulatory system: Secondary | ICD-10-CM | POA: Diagnosis not present

## 2023-07-03 DIAGNOSIS — N179 Acute kidney failure, unspecified: Secondary | ICD-10-CM | POA: Diagnosis present

## 2023-07-03 DIAGNOSIS — N184 Chronic kidney disease, stage 4 (severe): Secondary | ICD-10-CM | POA: Diagnosis present

## 2023-07-03 DIAGNOSIS — I5032 Chronic diastolic (congestive) heart failure: Secondary | ICD-10-CM | POA: Diagnosis present

## 2023-07-03 DIAGNOSIS — E8721 Acute metabolic acidosis: Secondary | ICD-10-CM | POA: Diagnosis present

## 2023-07-03 DIAGNOSIS — Z7982 Long term (current) use of aspirin: Secondary | ICD-10-CM | POA: Diagnosis not present

## 2023-07-03 DIAGNOSIS — J4489 Other specified chronic obstructive pulmonary disease: Secondary | ICD-10-CM | POA: Diagnosis present

## 2023-07-03 DIAGNOSIS — E871 Hypo-osmolality and hyponatremia: Secondary | ICD-10-CM | POA: Diagnosis present

## 2023-07-03 DIAGNOSIS — Z79899 Other long term (current) drug therapy: Secondary | ICD-10-CM | POA: Diagnosis not present

## 2023-07-03 DIAGNOSIS — N136 Pyonephrosis: Secondary | ICD-10-CM | POA: Diagnosis present

## 2023-07-03 DIAGNOSIS — D519 Vitamin B12 deficiency anemia, unspecified: Secondary | ICD-10-CM | POA: Diagnosis present

## 2023-07-03 DIAGNOSIS — T83090A Other mechanical complication of cystostomy catheter, initial encounter: Secondary | ICD-10-CM | POA: Diagnosis present

## 2023-07-03 LAB — BASIC METABOLIC PANEL WITH GFR
Anion gap: 8 (ref 5–15)
BUN: 25 mg/dL — ABNORMAL HIGH (ref 8–23)
CO2: 18 mmol/L — ABNORMAL LOW (ref 22–32)
Calcium: 7.7 mg/dL — ABNORMAL LOW (ref 8.9–10.3)
Chloride: 108 mmol/L (ref 98–111)
Creatinine, Ser: 2.36 mg/dL — ABNORMAL HIGH (ref 0.44–1.00)
GFR, Estimated: 22 mL/min — ABNORMAL LOW (ref >60)
Glucose, Bld: 106 mg/dL — ABNORMAL HIGH (ref 70–99)
Potassium: 3.8 mmol/L (ref 3.5–5.1)
Sodium: 134 mmol/L — ABNORMAL LOW (ref 135–145)

## 2023-07-03 LAB — CBC
HCT: 32 % — ABNORMAL LOW (ref 36.0–46.0)
Hemoglobin: 10.4 g/dL — ABNORMAL LOW (ref 12.0–15.0)
MCH: 31 pg (ref 26.0–34.0)
MCHC: 32.5 g/dL (ref 30.0–36.0)
MCV: 95.2 fL (ref 80.0–100.0)
Platelets: 284 10*3/uL (ref 150–400)
RBC: 3.36 MIL/uL — ABNORMAL LOW (ref 3.87–5.11)
RDW: 13.9 % (ref 11.5–15.5)
WBC: 10.9 10*3/uL — ABNORMAL HIGH (ref 4.0–10.5)
nRBC: 0 % (ref 0.0–0.2)

## 2023-07-03 LAB — URINE CULTURE

## 2023-07-03 LAB — MAGNESIUM: Magnesium: 1.4 mg/dL — ABNORMAL LOW (ref 1.7–2.4)

## 2023-07-03 LAB — PHOSPHORUS: Phosphorus: 2.3 mg/dL — ABNORMAL LOW (ref 2.5–4.6)

## 2023-07-03 MED ORDER — MAGNESIUM SULFATE 2 GM/50ML IV SOLN
2.0000 g | Freq: Once | INTRAVENOUS | Status: AC
  Administered 2023-07-03: 2 g via INTRAVENOUS
  Filled 2023-07-03: qty 50

## 2023-07-03 MED ORDER — GADOBUTROL 1 MMOL/ML IV SOLN
5.0000 mL | Freq: Once | INTRAVENOUS | Status: AC | PRN
  Administered 2023-07-03: 5 mL via INTRAVENOUS

## 2023-07-03 NOTE — TOC Initial Note (Signed)
 Transition of Care [facility]) - Initial/Assessment Note    Patient Details  Name: Shelia Chan MRN: [id number] Date of Birth: 12/10/1955  Transition of Care [facility]) CM/SW Contact:    Kathryn Parish, RN Phone Number: 07/03/2023, 3:44 PM  Clinical Narrative:                 Presented for UTI from home; PTA lives with son in-law and his wife in a house; PCP/insurance verified; DME - cane, walker, [place], BSC, WC; No HH, oxygen or SDOH needs; Family will transport home at discharge. TOC will follow progression to discharge.  Expected Discharge Plan: Home/Self Care Barriers to Discharge: Continued Medical Work up   Patient Goals and CMS Choice Patient states their goals for this hospitalization and ongoing recovery are:: Home CMS Medicare.gov Compare Post Acute Care list provided to::  (NA) Choice offered to / list presented to : NA [place] ownership interest in [facility].provided to:: Parent NA    Expected Discharge Plan and Services   Discharge Planning Services: NA Post Acute Care Choice: NA Living arrangements for the past 2 months: Single Family Home                 DME Arranged: N/A DME Agency: NA       HH Arranged: NA HH Agency: NA        Prior Living Arrangements/Services Living arrangements for the past 2 months: Single Family Home Lives with:: Adult Children, Relatives Patient language and need for interpreter reviewed:: Yes Do you feel safe going back to the place where you live?: Yes      Need for Family Participation in Patient Care: Yes (Comment) Care giver support system in place?: Yes (comment) Current home services: DME (cane,walker, [place], BSC, WC) Criminal Activity/Legal Involvement Pertinent to Current Situation/Hospitalization: No - Comment as needed  Activities of Daily Living   ADL Screening (condition at time of admission) Independently performs ADLs?: Yes (appropriate for developmental age) Is the patient deaf or have difficulty  hearing?: No Does the patient have difficulty seeing, even when wearing glasses/contacts?: No Does the patient have difficulty concentrating, remembering, or making decisions?: No  Permission Sought/Granted Permission sought to share information with : Case Manager Permission granted to share information with : Yes, Verbal Permission Granted  Share Information with NAME: Baker Bon and Lawrance Presume     Permission granted to share info w Relationship: Son inlaw  Permission granted to share info w Contact Information: [phone]  Emotional Assessment Appearance:: Appears stated age Attitude/Demeanor/Rapport: Engaged Affect (typically observed): Appropriate Orientation: : Oriented to Self, Oriented to Place, Oriented to  Time, Oriented to Situation Alcohol / Substance Use: Not Applicable Psych Involvement: No (comment)  Admission diagnosis:  Complicated UTI (urinary tract infection) [N39.0] Patient Active Problem List   Diagnosis Date Noted   Liver lesion 07/02/2023   Hyperlipidemia 12/29/2022   Complicated UTI (urinary tract infection) 07/24/2022   Pubic bone fracture (HCC) 04/22/2021    Class: Acute   Septic arthritis (HCC)    Hypokalemia 04/21/2021   Vaginal bleeding    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   Thrombocytosis 03/23/2021   Greater trochanter fracture (HCC) 03/17/2021   Vaginal discharge 03/14/2021   COPD (chronic obstructive pulmonary disease) (HCC)    Chronic diastolic CHF (congestive heart failure) (HCC)    Depression with anxiety    UTI (urinary tract infection) due to urinary indwelling catheter (HCC) 11/09/2020   Pubic bone  Osteomyelitis, bilaterally, 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   Hematuria 11/08/2020   Asthma, chronic, unspecified asthma severity, with acute exacerbation 11/08/2020   Edema 11/08/2020   Hydronephrosis of right kidney 07/17/2020   CKD (chronic kidney disease), stage IV (HCC) 04/08/2019    PCP:  Ulysees Gander, MD Pharmacy:   [facility] [id number] [place], [place] - [address] [place] [address] Phone: [phone] Fax: [phone]     Social Drivers of Health (SDOH) Social History: SDOH Screenings   Food Insecurity: No Food Insecurity (07/02/2023)  Housing: Low Risk  (07/02/2023)  Transportation Needs: No Transportation Needs (07/02/2023)  Utilities: Not At Risk (07/02/2023)  Depression (PHQ2-9): Low Risk  (01/27/2022)  Social Connections: Moderately Integrated (07/02/2023)  Tobacco Use: High Risk (07/02/2023)   SDOH Interventions:     Readmission Risk Interventions     No data to display

## 2023-07-03 NOTE — Progress Notes (Signed)
 Mobility Specialist - Progress Note   07/03/23 0946  Mobility  Activity Ambulated with assistance in hallway  Level of Assistance Modified independent, requires aide device or extra time  Assistive Device Other (Comment) (IV Pole)  Distance Ambulated (ft) 120 ft  Activity Response Tolerated well  Mobility Referral Yes  Mobility visit 1 Mobility  Mobility Specialist Start Time (ACUTE ONLY) [id number]  Mobility Specialist Stop Time (ACUTE ONLY) 0945  Mobility Specialist Time Calculation (min) (ACUTE ONLY) 8 min   Pt received in bed and agreeable to mobility. Distance limited d/t pain. No complaints during session. Pt to bed after session with all needs met.   [facility]

## 2023-07-03 NOTE — Plan of Care (Signed)

## 2023-07-03 NOTE — Addendum Note (Signed)
 Encounter addended by: Adam Holm on: 07/03/2023 1:34 PM  Actions taken: Imaging Exam ended

## 2023-07-03 NOTE — Consult Note (Addendum)
 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 Maury Space  HISTORY OF PRESENTING ILLNESS:  Shelia Chan 68 y.o. female who was admitted on 07/02/2023 with complaints of abdominal pain.  Patient states pain started approximately 2 nights ago and she thought pain was related to stent blockage and previous kidney problems.  Patient has history of squamous cell carcinoma of urethra and therefore oncology consult has been requested. Patient is seen awake alert and oriented x 3 laying in bed.  Followed with Dr. Nathalie Baize and lost to follow-up since 10/2021.  Patient states that she was not aware that she should have been coming for routine oncology follow-ups.  Patient details complaints of abdominal pain as stated above.  Also admits to back pain and nausea which started around the same time. Medical history significant for SCC urethra, CKD stage IV, COPD, asthma.  Of note patient reports that her oncologic care surgery, radiation and chemotherapy was done in [place] .  She moved to [place]  approximately 5 years ago and saw Dr. Dirk Fredericks with [facility]. Surgical history includes hysterectomy, cystectomy, and multiple neph ureteral catheter exchanges. Family history includes a sister who died in 07/24/2003 from stomach cancer. Social history significant for 50-year tobacco use, quit 5 years ago, smoked 1 pack/day.  Admits to alcohol use which she stopped in 07-24-94.  Admits to ongoing marijuana use which she states helps with her appetite and pain.  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   I have reviewed her chart and materials related to her cancer extensively and collaborated history with the  patient. Summary of oncologic history is as follows: Oncology History   No history exists.    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 - Status post chemotherapy, radiation, cystectomy and urethrectomy in [place] . - Patient moved to   5 years ago, seen 11/13/2020 and was followed by medical oncology at [facility]/Dr. Dirk Fredericks at that time.  She was lost to follow-up 10/2021 was her last outpatient medical oncology visit.  However, patient acknowledges coming in regularly for her catheter exchanges. - Patient reports that she is not sure if she wants further oncologic treatment if recommended and wants to talk to her family before deciding. - Medical oncology/Dr. Arno Bibles will see patient and make further recommendations.  Back pain Severe hydronephrosis - Likely secondary to malignancy - Bilateral nephroureteral stents with routine stent exchanges - Continue pain meds as ordered  Leukocytosis UTI - Likely secondary to infectious process - Continue antibiotics as ordered - Monitor fever curve  Nausea/vomiting - Improving - Continue supportive care  Anemia, normocytic - Mild - Hemoglobin 10.4 today - Continue to monitor CBC with differential   MEDICAL HISTORY:  Past Medical History:  Diagnosis Date   Arthritis    Asthma    Blood transfusion without reported diagnosis    Cancer (HCC)    CHF (congestive heart failure) (HCC)    COPD (chronic obstructive pulmonary disease) (HCC)    Heart murmur    Immune deficiency disorder (HCC)    Renal disorder     SURGICAL HISTORY: Past Surgical History:  Procedure Laterality Date  ABDOMINAL HYSTERECTOMY     BLADDER REMOVAL  118/22/2015   FRACTURE SURGERY Left 02/2018   pins   IR CATHETER TUBE CHANGE  04/15/2019   IR CATHETER TUBE CHANGE  04/15/2019   IR EXT NEPHROURETERAL CATH EXCHANGE  07/15/2019   IR EXT NEPHROURETERAL CATH  EXCHANGE  10/14/2019   IR EXT NEPHROURETERAL CATH EXCHANGE  10/14/2019   IR EXT NEPHROURETERAL CATH EXCHANGE  01/06/2020   IR EXT NEPHROURETERAL CATH EXCHANGE  03/16/2020   IR EXT NEPHROURETERAL CATH EXCHANGE  05/25/2020   IR EXT NEPHROURETERAL CATH EXCHANGE  07/17/2020   IR EXT NEPHROURETERAL CATH EXCHANGE  07/17/2020   IR EXT NEPHROURETERAL CATH EXCHANGE  08/14/2020   IR EXT NEPHROURETERAL CATH EXCHANGE  09/25/2020   IR EXT NEPHROURETERAL CATH EXCHANGE  11/06/2020   IR EXT NEPHROURETERAL CATH EXCHANGE  12/25/2020   IR EXT NEPHROURETERAL CATH EXCHANGE  02/09/2021   IR EXT NEPHROURETERAL CATH EXCHANGE  03/19/2021   IR EXT NEPHROURETERAL CATH EXCHANGE  03/19/2021   IR EXT NEPHROURETERAL CATH EXCHANGE  04/23/2021   IR EXT NEPHROURETERAL CATH EXCHANGE  04/23/2021   IR EXT NEPHROURETERAL CATH EXCHANGE  06/04/2021   IR EXT NEPHROURETERAL CATH EXCHANGE  07/15/2021   IR EXT NEPHROURETERAL CATH EXCHANGE  09/02/2021   IR EXT NEPHROURETERAL CATH EXCHANGE  10/14/2021   IR EXT NEPHROURETERAL CATH EXCHANGE  11/25/2021   IR EXT NEPHROURETERAL CATH EXCHANGE  01/06/2022   IR EXT NEPHROURETERAL CATH EXCHANGE  02/17/2022   IR EXT NEPHROURETERAL CATH EXCHANGE  03/31/2022   IR EXT NEPHROURETERAL CATH EXCHANGE  05/12/2022   IR EXT NEPHROURETERAL CATH EXCHANGE  06/30/2022   IR EXT NEPHROURETERAL CATH EXCHANGE  08/18/2022   IR EXT NEPHROURETERAL CATH EXCHANGE  10/06/2022   IR EXT NEPHROURETERAL CATH EXCHANGE  11/24/2022   IR EXT NEPHROURETERAL CATH EXCHANGE  02/09/2023   IR EXT NEPHROURETERAL CATH EXCHANGE  03/23/2023   IR EXT NEPHROURETERAL CATH EXCHANGE  04/26/2023   IR EXT NEPHROURETERAL CATH EXCHANGE  06/01/2023   IR NEPHROSTOMY EXCHANGE LEFT  12/30/2022   IR NEPHROSTOMY EXCHANGE RIGHT  12/30/2022   IR PERC TUN PERIT CATH WO PORT S&I /IMAG  03/25/2021   IR REMOVAL TUN CV CATH W/O FL  06/04/2021    SOCIAL HISTORY: Social History   Socioeconomic History   Marital status: Divorced    Spouse name:  Not on file   Number of children: Not on file   Years of education: Not on file   Highest education level: Not on file  Occupational History   Occupation: disabled  Tobacco Use   Smoking status: Some Days    Current packs/day: 0.00    Average packs/day: 1 pack/day for 50.0 years (50.0 ttl pk-yrs)    Types: Cigarettes, E-cigarettes    Start date: 01/20/1969    Last attempt to quit: 01/21/2019    Years since quitting: 4.4   Smokeless tobacco: Never   Tobacco comments:    Quit smoking but vapes occasionally  Vaping Use   Vaping status: Never Used  Substance and Sexual Activity   Alcohol use: Not Currently   Drug use: Yes    Types: Marijuana    Comment: occasional    Sexual activity: Not on file  Other Topics Concern   Not on file  Social History Narrative   Not on file   Social Drivers of Health   Financial Resource Strain: Not on file  Food Insecurity: No Food Insecurity (07/02/2023)   Hunger Vital Sign  Worried About Programme researcher, broadcasting/film/video in the Last Year: Never true    Ran Out of Food in the Last Year: Never true  Transportation Needs: No Transportation Needs (07/02/2023)   PRAPARE - Administrator, Civil Service (Medical): No    Lack of Transportation (Non-Medical): No  Physical Activity: Not on file  Stress: Not on file  Social Connections: Moderately Integrated (07/02/2023)   Social Connection and Isolation Panel    Frequency of Communication with Friends and Family: More than three times a week    Frequency of Social Gatherings with Friends and Family: Three times a week    Attends Religious Services: More than 4 times per year    Active Member of Clubs or Organizations: Yes    Attends Banker Meetings: More than 4 times per year    Marital Status: Divorced  Intimate Partner Violence: Not At Risk (07/02/2023)   Humiliation, Afraid, Rape, and Kick questionnaire    Fear of Current or Ex-Partner: No    Emotionally Abused: No    Physically  Abused: No    Sexually Abused: No    FAMILY HISTORY: Family History  Problem Relation Age of Onset   Heart disease Maternal Grandfather      PHYSICAL EXAMINATION: ECOG PERFORMANCE STATUS: 3 - Symptomatic, >50% confined to bed  Vitals:   07/03/23 0859 07/03/23 0916  BP: 118/72 107/66  Pulse:  69  Resp:  20  Temp:  98.1 F (36.7 C)  SpO2:  99%   Filed Weights   07/02/23 2100  Weight: 126 lb 15.8 oz (57.6 kg)    GENERAL: alert, +chronically ill-appearing SKIN: + Pale skin color, texture, turgor are normal, no rashes or significant lesions EYES: normal, conjunctiva are pink and non-injected, sclera clear OROPHARYNX: no exudate, no erythema and lips, buccal mucosa, and tongue normal  NECK: supple, thyroid normal size, non-tender, without nodularity LYMPH: no palpable lymphadenopathy in the cervical, axillary or inguinal LUNGS: clear to auscultation and percussion with normal breathing effort HEART: regular rate & rhythm and no murmurs and no lower extremity edema ABDOMEN: abdomen soft, non-tender and normal bowel sounds MUSCULOSKELETAL: no cyanosis of digits and no clubbing  PSYCH: alert & oriented x 3 with fluent speech    ALLERGIES:  is allergic to metoprolol , shellfish-derived products, and tape.  MEDICATIONS:  Current Facility-Administered Medications  Medication Dose Route Frequency Provider Last Rate Last Admin   acetaminophen  (TYLENOL ) tablet 1,000 mg  1,000 mg Oral Q6H PRN Segars, Jonathan, MD   1,000 mg at 07/03/23 0545   ARIPiprazole  (ABILIFY ) tablet 30 mg  30 mg Oral Daily Segars, Arlyce Lambert, MD   30 mg at 07/03/23 0900   atorvastatin  (LIPITOR) tablet 40 mg  40 mg Oral Daily Segars, Arlyce Lambert, MD   40 mg at 07/03/23 0900   diltiazem  (CARDIZEM  CD) 24 hr capsule 120 mg  120 mg Oral Daily Segars, Arlyce Lambert, MD   120 mg at 07/03/23 0900   DULoxetine  (CYMBALTA ) DR capsule 30 mg  30 mg Oral Daily Segars, Arlyce Lambert, MD   30 mg at 07/03/23 0900   HYDROmorphone  (DILAUDID )  injection 0.5 mg  0.5 mg Intravenous Q4H PRN Segars, Jonathan, MD   0.5 mg at 07/03/23 0417   ipratropium-albuterol  (DUONEB) 0.5-2.5 (3) MG/3ML nebulizer solution 3 mL  3 mL Nebulization Q6H PRN Segars, Jonathan, MD       melatonin tablet 6 mg  6 mg Oral QHS PRN Arnulfo Larch, MD  ondansetron  (ZOFRAN ) injection 4 mg  4 mg Intravenous Q6H PRN Arnulfo Larch, MD       oxyCODONE  (Oxy IR/ROXICODONE ) immediate release tablet 2.5 mg  2.5 mg Oral Q4H PRN Arnulfo Larch, MD       Or   oxyCODONE  (Oxy IR/ROXICODONE ) immediate release tablet 5 mg  5 mg Oral Q4H PRN Arnulfo Larch, MD   5 mg at 07/02/23 2131   pantoprazole  (PROTONIX ) EC tablet 40 mg  40 mg Oral Daily PRN Arnulfo Larch, MD       piperacillin -tazobactam (ZOSYN ) IVPB 3.375 g  3.375 g Intravenous Q6H Arnulfo Larch, MD 100 mL/hr at 07/03/23 0413 3.375 g at 07/03/23 0413   prochlorperazine  (COMPAZINE ) injection 10 mg  10 mg Intravenous Q6H PRN Arnulfo Larch, MD         LABORATORY DATA:  I have reviewed the data as listed Lab Results  Component Value Date   WBC 10.9 (H) 07/03/2023   HGB 10.4 (L) 07/03/2023   HCT 32.0 (L) 07/03/2023   MCV 95.2 07/03/2023   PLT 284 07/03/2023   Recent Labs    12/29/22 1430 12/29/22 1518 12/30/22 0556 12/31/22 0746 06/01/23 1215 07/02/23 1627 07/03/23 0423  NA 139   < > 135   < > 138 140 134*  K 3.4*   < > 3.5   < > 3.7 3.3* 3.8  CL 111   < > 106   < > 110 107 108  CO2 19*  --  19*   < > 16* 19* 18*  GLUCOSE 110*   < > 99   < > 110* 92 106*  BUN 30*   < > 33*   < > 34* 25* 25*  CREATININE 1.97*   < > 2.11*   < > 2.52* 2.29* 2.36*  CALCIUM  7.4*  --  7.4*   < > 8.8* 9.1 7.7*  GFRNONAA 27*  --  25*   < > 20* 23* 22*  PROT 6.0*  --  5.8*  --   --  7.6  --   ALBUMIN 3.0*  --  2.9*  --   --  3.8  --   AST 12*  --  11*  --   --  19  --   ALT 9  --  8  --   --  17  --   ALKPHOS 63  --  63  --   --  79  --   BILITOT 0.6  --  0.5  --   --  0.7  --    < > = values in this  interval not displayed.    RADIOGRAPHIC STUDIES: I have personally reviewed the radiological images as listed and agreed with the findings in the report. MR LIVER W WO CONTRAST Result Date: 07/03/2023 CLINICAL DATA:  History of urethral squamous cell carcinoma status post chemo radiation, cystectomy, and ileal conduit with new multifocal hepatic lesions EXAM: MRI ABDOMEN WITHOUT AND WITH CONTRAST TECHNIQUE: Multiplanar multisequence MR imaging of the abdomen was performed both before and after the administration of intravenous contrast. CONTRAST:  5mL GADAVIST  GADOBUTROL  1 MMOL/ML IV SOLN COMPARISON:  CT abdomen and pelvis dated 07/02/2023, 12/29/2022 FINDINGS: Lower chest: No acute findings. Hepatobiliary: Innumerable peripherally enhancing foci throughout the liver, for example 2.4 x 2.1 cm segment 3 (13:48) and 2.9 x 2.5 cm segment 6 (13:55). No bile duct dilation. Distended gallbladder. Pancreas: No mass, inflammatory changes, or other parenchymal abnormality identified. Spleen:  Within normal limits in  size and appearance. Adrenals/Urinary Tract: No adrenal nodules. Similar severe left hydroureteronephrosis to the level the proximal left ureter. Bilateral ureteral stents in-situ. Interpolar right renal simple and hemorrhagic/proteinaceous cysts. Layering fluid level within the left renal collecting system. Stomach/Bowel: Visualized portions within the abdomen are unremarkable. Vascular/Lymphatic: Slight interval increase in size of an 8 mm left para-aortic lymph node (15:39), previously 6 mm. No abdominal aortic aneurysm demonstrated. Other:  Right lower quadrant ileal conduit with stents in-situ. Musculoskeletal: Multifocal enhancing, diffusion restricting foci within the spine and partially imaged pelvis, suspicious for osseous metastases, for example 1.7 x 1.6 cm anterior segment L4 (15:56) and right transverse process of L2 (15:29). Partially imaged postsurgical changes of the left proximal femur.  IMPRESSION: 1. Innumerable peripherally enhancing foci throughout the liver, suspicious for hepatic metastases. 2. Multifocal enhancing, diffusion restricting foci within the spine and partially imaged pelvis, suspicious for osseous metastases, new compared to 12/29/2022. 3. Slight interval increase in size of an 8 mm left para-aortic lymph node, suspicious for nodal metastasis. 4. Similar severe left hydroureteronephrosis to the level the proximal left ureter. Bilateral ureteral stents in-situ. Layering fluid level within the left renal collecting system, which may reflect blood products or debris. Electronically Signed   By: Limin  Xu M.D.   On: 07/03/2023 08:56   CT ABDOMEN PELVIS WO CONTRAST Result Date: 07/02/2023 CLINICAL DATA:  Abdominal pain, acute, nonlocalized. Left-sided flank pain radiating toward left side of abdomen. Urostomy bag with decreased output. EXAM: CT ABDOMEN AND PELVIS WITHOUT CONTRAST TECHNIQUE: Multidetector CT imaging of the abdomen and pelvis was performed following the standard protocol without IV contrast. RADIATION DOSE REDUCTION: This exam was performed according to the departmental dose-optimization program which includes automated exposure control, adjustment of the mA and/or kV according to patient size and/or use of iterative reconstruction technique. COMPARISON:  12/29/2022. FINDINGS: Lower chest: No acute abnormality. Hepatobiliary: Scattered ill-defined hypodensities are present in the liver measuring up to 2.7 cm, suspicious for metastatic disease. No biliary ductal dilatation is seen. The gallbladder is without stones. Pancreas: Unremarkable. No pancreatic ductal dilatation or surrounding inflammatory changes. Spleen: Normal in size without focal abnormality. Adrenals/Urinary Tract: The adrenal glands are within normal limits. There is severe hydronephrosis on the left with renal cortical thinning. Ureteral stents are noted bilaterally in terminating in a right lower  quadrant ileal conduit and ostomy. No renal or ureteral calculus is seen. Peri ureteral fat stranding is present on the left, similar in appearance to the prior exam. The bladder is surgically absent. Stomach/Bowel: The stomach is within normal limits. The appendix is not seen. There are mildly distended loops of bowel in the right lower quadrant measuring up to 3.5 cm adjacent to the ileal conduit. No definite transition 0.18, pneumatosis, or free air is seen. Vascular/Lymphatic: Aortic atherosclerosis. No abdominal or pelvic lymphadenopathy is seen. Reproductive: Status post hysterectomy. No adnexal masses. Other: There is no free fluid in the pelvis. Musculoskeletal: Fixation hardware is noted in the proximal left femur. Degenerative changes are noted in the thoracolumbar spine. Sacral iliac attic an pubic symphysis sclerosis and fragmentation are unchanged. IMPRESSION: 1. Severe left hydronephrosis with renal cortical thinning unchanged. Bilateral ureteral stents are noted with right lower quadrant ileal conduit and ostomy. 2. Mildly distended loops of bowel in the right lower quadrant adjacent to the ostomy measuring up to 3.5 cm, possibly representing small bowel versus ileal conduit. Repeat evaluation with oral contrast may be beneficial for further evaluation. 3. Multiple hypodensities in the liver measuring up  to 2.7 cm, suspicious for metastatic disease. Multiphase CT or MRI is recommended for further evaluation. 4. Aortic atherosclerosis. Electronically Signed   By: Wyvonnia Heimlich M.D.   On: 07/02/2023 16:34     The total time spent in the appointment was 55 minutes encounter with patients including review of chart and various tests results, discussions about plan of care and coordination of care plan   All questions were answered. The patient knows to call the clinic with any problems, questions or concerns. No barriers to learning was detected.  Jacqualin Mate, NP 6/16/202510:08 AM     Attending Note  I personally saw the patient, reviewed the chart and examined the patient. The plan of care was discussed with the patient and the admitting team. I agree with the assessment and plan as documented above. Thank you very much for the consultation. I saw the pt, she mostly complains of flank pain for the last 3 days which is the primary reason she came to the hospital. She also mentions about ongoing SOB, quit smoking about 5 yrs ago, prior to that was a 50 PPY smoker. Initially she mentioned to our NP that she is not willing to consider any evaluation, she said she talked to her family and she would like to fight it at this time. She was treated for locally advanced urethral cancer and was followed by Dr Dirk Fredericks. I discussed the metastatic nature of the disease and non curative intent of treatment. At this time, if we can consider inpt liver biopsy and stent exchange inpt, we can certainly follow up with our GU team outpt to discuss treatment recommendations. I understand the timing of the biopsy may have to be determined since she appears to be febrile and is concurrently being treated for UTI She is willing to proceed with the liver biopsy. She will likely see Dr Alita Irwin outpt.

## 2023-07-03 NOTE — Progress Notes (Signed)
 PROGRESS NOTE    Shelia Chan  ZOX:[id number] DOB: [birth year] DOA: 07/02/2023 PCP: Shelia Gander, MD   Brief Narrative:  68 y.o. female with hx of SCC of urethra s/p chemo/radiation, cystectomy and urethrectomy, LN dissection, ileal conduit, bilateral nephroureteral stents with routine stent exchanges by IR, severe L hydronephrosis, CKD stage IV, HFpEF, atrial tachycardia, COPD / asthma, Lymphedema and recurrent UTIs presented with left flank pain with nausea and vomiting.  On presentation, she was found to have possible UTI.  CT of abdomen and pelvis showed multiple hypodensities in the liver measuring up to 2.7 cm, suspicious for metastatic disease along with severe left hydronephrosis with renal cortical thinning, unchanged, and bilateral ureteral stents with right quadrant ileal conduit and ostomy.  She was started on IV fluids and antibiotics.  Assessment & Plan:   Complicated UTI: Possibly related to her ileal conduit and nephroureteral stents History of ileal conduit with bilateral nephroureteral stents with routine exchanges by IR and severe left hydronephrosis - Follow urine cultures.  Continue Zosyn  for now.  Temperature max of 100.4 since admission - Will consult IR to see if they can exchange bilateral nephroureteral stents  Multiple hypodense liver lesions, concern for metastatic disease History of SCC of urethra s/p chemo/radiation, cystectomy and urethrectomy - MRI of liver shows possible hepatic metastases along with osseous metastases, nodal metastases - Will consult oncology  CKD stage IV - Creatinine currently at baseline.  Monitor  Hyponatremia - Mild.  Monitor  Hypokalemia -Resolved  Acute metabolic acidosis -Monitor  Anemia of chronic disease - From chronic illnesses.  And renal disease.  Hemoglobin stable.  Monitor intermittently  Hypomagnesemia - Replace.  Repeat a.m. labs  Hyperlipidemia Will continue atorvastatin   Mood disorder -  Continue aripiprazole  and duloxetine   History of lymphedema - Lasix  on hold for now  History of atrial tachycardia -rate controlled - Continue diltiazem   COPD/asthma -Stable.   DVT prophylaxis: SCDs Code Status: DNR Family Communication: None at bedside Disposition Plan: Status is: Observation The patient will require care spanning > 2 midnights and should be moved to inpatient because: Of severity of illness    Consultants: Consult oncology and IR  Procedures: None  Antimicrobials: Zosyn  from 07/02/2023 onwards   Subjective: Patient seen and examined at bedside.  Feels slightly better with slight improvement in flank pain.  Had fever overnight.  No vomiting, chest pain reported  Objective: Vitals:   07/03/23 0206 07/03/23 0541 07/03/23 0859 07/03/23 0916  BP: 104/61 120/75 118/72 107/66  Pulse: 78 89  69  Resp: 17 17  20   Temp: (!) 100.4 F (38 C) 100.1 F (37.8 C)  98.1 F (36.7 C)  TempSrc: Oral Oral  Oral  SpO2: 98% 96%  99%  Weight:        Intake/Output Summary (Last 24 hours) at 07/03/2023 0941 Last data filed at 07/03/2023 0900 Gross per 24 hour  Intake 940 ml  Output 950 ml  Net -10 ml   Filed Weights   07/02/23 2100  Weight: 57.6 kg    Examination:  General exam: Appears calm and comfortable.  Chronically ill and deconditioned looking  respiratory system: Bilateral decreased breath sounds at bases, no wheezing Cardiovascular system: S1 & S2 heard, Rate controlled Gastrointestinal system: Abdomen is nondistended, soft and nontender. Normal bowel sounds heard. Extremities: No cyanosis, clubbing; bilateral lower extremity edema present Genitourinary: Left flank tenderness present.  Ostomy with clear urine output Central nervous system: Alert and oriented. No focal neurological deficits. Moving  extremities Skin: No rashes, lesions or ulcers Psychiatry: Judgement and insight appear normal. Mood & affect appropriate.     Data Reviewed: I have  personally reviewed following labs and imaging studies  CBC: Recent Labs  Lab 07/02/23 1803 07/03/23 0423  WBC 10.9* 10.9*  NEUTROABS 8.3*  --   HGB 11.6* 10.4*  HCT 35.6* 32.0*  MCV 95.2 95.2  PLT 330 284   Basic Metabolic Panel: Recent Labs  Lab 07/02/23 1627 07/03/23 0423  NA 140 134*  K 3.3* 3.8  CL 107 108  CO2 19* 18*  GLUCOSE 92 106*  BUN 25* 25*  CREATININE 2.29* 2.36*  CALCIUM  9.1 7.7*  MG  --  1.4*  PHOS  --  2.3*   GFR: Estimated Creatinine Clearance: 18.4 mL/min (A) (by C-G formula based on SCr of 2.36 mg/dL (H)). Liver Function Tests: Recent Labs  Lab 07/02/23 1627  AST 19  ALT 17  ALKPHOS 79  BILITOT 0.7  PROT 7.6  ALBUMIN 3.8   No results for input(s): LIPASE, AMYLASE in the last 168 hours. No results for input(s): AMMONIA in the last 168 hours. Coagulation Profile: No results for input(s): INR, PROTIME in the last 168 hours. Cardiac Enzymes: No results for input(s): CKTOTAL, CKMB, CKMBINDEX, TROPONINI in the last 168 hours. BNP (last 3 results) No results for input(s): PROBNP in the last 8760 hours. HbA1C: No results for input(s): HGBA1C in the last 72 hours. CBG: No results for input(s): GLUCAP in the last 168 hours. Lipid Profile: No results for input(s): CHOL, HDL, LDLCALC, TRIG, CHOLHDL, LDLDIRECT in the last 72 hours. Thyroid Function Tests: No results for input(s): TSH, T4TOTAL, FREET4, T3FREE, THYROIDAB in the last 72 hours. Anemia Panel: No results for input(s): VITAMINB12, FOLATE, FERRITIN, TIBC, IRON, RETICCTPCT in the last 72 hours. Sepsis Labs: No results for input(s): PROCALCITON, LATICACIDVEN in the last 168 hours.  No results found for this or any previous visit (from the past 240 hours).       Radiology Studies: MR LIVER W WO CONTRAST Result Date: 07/03/2023 CLINICAL DATA:  History of urethral squamous cell carcinoma status post chemo radiation,  cystectomy, and ileal conduit with new multifocal hepatic lesions EXAM: MRI ABDOMEN WITHOUT AND WITH CONTRAST TECHNIQUE: Multiplanar multisequence MR imaging of the abdomen was performed both before and after the administration of intravenous contrast. CONTRAST:  5mL GADAVIST  GADOBUTROL  1 MMOL/ML IV SOLN COMPARISON:  CT abdomen and pelvis dated 07/02/2023, 12/29/2022 FINDINGS: Lower chest: No acute findings. Hepatobiliary: Innumerable peripherally enhancing foci throughout the liver, for example 2.4 x 2.1 cm segment 3 (13:48) and 2.9 x 2.5 cm segment 6 (13:55). No bile duct dilation. Distended gallbladder. Pancreas: No mass, inflammatory changes, or other parenchymal abnormality identified. Spleen:  Within normal limits in size and appearance. Adrenals/Urinary Tract: No adrenal nodules. Similar severe left hydroureteronephrosis to the level the proximal left ureter. Bilateral ureteral stents in-situ. Interpolar right renal simple and hemorrhagic/proteinaceous cysts. Layering fluid level within the left renal collecting system. Stomach/Bowel: Visualized portions within the abdomen are unremarkable. Vascular/Lymphatic: Slight interval increase in size of an 8 mm left para-aortic lymph node (15:39), previously 6 mm. No abdominal aortic aneurysm demonstrated. Other:  Right lower quadrant ileal conduit with stents in-situ. Musculoskeletal: Multifocal enhancing, diffusion restricting foci within the spine and partially imaged pelvis, suspicious for osseous metastases, for example 1.7 x 1.6 cm anterior segment L4 (15:56) and right transverse process of L2 (15:29). Partially imaged postsurgical changes of the left proximal femur. IMPRESSION: 1. Innumerable  peripherally enhancing foci throughout the liver, suspicious for hepatic metastases. 2. Multifocal enhancing, diffusion restricting foci within the spine and partially imaged pelvis, suspicious for osseous metastases, new compared to 12/29/2022. 3. Slight interval  increase in size of an 8 mm left para-aortic lymph node, suspicious for nodal metastasis. 4. Similar severe left hydroureteronephrosis to the level the proximal left ureter. Bilateral ureteral stents in-situ. Layering fluid level within the left renal collecting system, which may reflect blood products or debris. Electronically Signed   By: Limin  Xu M.D.   On: 07/03/2023 08:56   CT ABDOMEN PELVIS WO CONTRAST Result Date: 07/02/2023 CLINICAL DATA:  Abdominal pain, acute, nonlocalized. Left-sided flank pain radiating toward left side of abdomen. Urostomy bag with decreased output. EXAM: CT ABDOMEN AND PELVIS WITHOUT CONTRAST TECHNIQUE: Multidetector CT imaging of the abdomen and pelvis was performed following the standard protocol without IV contrast. RADIATION DOSE REDUCTION: This exam was performed according to the departmental dose-optimization program which includes automated exposure control, adjustment of the mA and/or kV according to patient size and/or use of iterative reconstruction technique. COMPARISON:  12/29/2022. FINDINGS: Lower chest: No acute abnormality. Hepatobiliary: Scattered ill-defined hypodensities are present in the liver measuring up to 2.7 cm, suspicious for metastatic disease. No biliary ductal dilatation is seen. The gallbladder is without stones. Pancreas: Unremarkable. No pancreatic ductal dilatation or surrounding inflammatory changes. Spleen: Normal in size without focal abnormality. Adrenals/Urinary Tract: The adrenal glands are within normal limits. There is severe hydronephrosis on the left with renal cortical thinning. Ureteral stents are noted bilaterally in terminating in a right lower quadrant ileal conduit and ostomy. No renal or ureteral calculus is seen. Peri ureteral fat stranding is present on the left, similar in appearance to the prior exam. The bladder is surgically absent. Stomach/Bowel: The stomach is within normal limits. The appendix is not seen. There are mildly  distended loops of bowel in the right lower quadrant measuring up to 3.5 cm adjacent to the ileal conduit. No definite transition 0.18, pneumatosis, or free air is seen. Vascular/Lymphatic: Aortic atherosclerosis. No abdominal or pelvic lymphadenopathy is seen. Reproductive: Status post hysterectomy. No adnexal masses. Other: There is no free fluid in the pelvis. Musculoskeletal: Fixation hardware is noted in the proximal left femur. Degenerative changes are noted in the thoracolumbar spine. Sacral iliac attic an pubic symphysis sclerosis and fragmentation are unchanged. IMPRESSION: 1. Severe left hydronephrosis with renal cortical thinning unchanged. Bilateral ureteral stents are noted with right lower quadrant ileal conduit and ostomy. 2. Mildly distended loops of bowel in the right lower quadrant adjacent to the ostomy measuring up to 3.5 cm, possibly representing small bowel versus ileal conduit. Repeat evaluation with oral contrast may be beneficial for further evaluation. 3. Multiple hypodensities in the liver measuring up to 2.7 cm, suspicious for metastatic disease. Multiphase CT or MRI is recommended for further evaluation. 4. Aortic atherosclerosis. Electronically Signed   By: Wyvonnia Heimlich M.D.   On: 07/02/2023 16:34        Scheduled Meds:  ARIPiprazole   30 mg Oral Daily   atorvastatin   40 mg Oral Daily   diltiazem   120 mg Oral Daily   DULoxetine   30 mg Oral Daily   Continuous Infusions:  magnesium  sulfate bolus IVPB 2 g (07/03/23 0859)   piperacillin -tazobactam (ZOSYN )  IV 3.375 g (07/03/23 0413)          Audria Leather, MD [facility] 07/03/2023, 9:41 AM

## 2023-07-03 NOTE — Progress Notes (Signed)
 The patient was transported downstairs at this time to complete her MRI.

## 2023-07-04 DIAGNOSIS — Z9889 Other specified postprocedural states: Secondary | ICD-10-CM

## 2023-07-04 DIAGNOSIS — N390 Urinary tract infection, site not specified: Secondary | ICD-10-CM | POA: Diagnosis not present

## 2023-07-04 LAB — CBC WITH DIFFERENTIAL/PLATELET
Abs Immature Granulocytes: 0.03 10*3/uL (ref 0.00–0.07)
Basophils Absolute: 0 10*3/uL (ref 0.0–0.1)
Basophils Relative: 0 %
Eosinophils Absolute: 0.1 10*3/uL (ref 0.0–0.5)
Eosinophils Relative: 1 %
HCT: 33.8 % — ABNORMAL LOW (ref 36.0–46.0)
Hemoglobin: 10.8 g/dL — ABNORMAL LOW (ref 12.0–15.0)
Immature Granulocytes: 0 %
Lymphocytes Relative: 10 %
Lymphs Abs: 1 10*3/uL (ref 0.7–4.0)
MCH: 31 pg (ref 26.0–34.0)
MCHC: 32 g/dL (ref 30.0–36.0)
MCV: 97.1 fL (ref 80.0–100.0)
Monocytes Absolute: 0.8 10*3/uL (ref 0.1–1.0)
Monocytes Relative: 8 %
Neutro Abs: 8 10*3/uL — ABNORMAL HIGH (ref 1.7–7.7)
Neutrophils Relative %: 81 %
Platelets: 279 10*3/uL (ref 150–400)
RBC: 3.48 MIL/uL — ABNORMAL LOW (ref 3.87–5.11)
RDW: 13.8 % (ref 11.5–15.5)
WBC: 10 10*3/uL (ref 4.0–10.5)
nRBC: 0 % (ref 0.0–0.2)

## 2023-07-04 LAB — BASIC METABOLIC PANEL WITH GFR
Anion gap: 13 (ref 5–15)
BUN: 26 mg/dL — ABNORMAL HIGH (ref 8–23)
CO2: 16 mmol/L — ABNORMAL LOW (ref 22–32)
Calcium: 7.7 mg/dL — ABNORMAL LOW (ref 8.9–10.3)
Chloride: 106 mmol/L (ref 98–111)
Creatinine, Ser: 2.65 mg/dL — ABNORMAL HIGH (ref 0.44–1.00)
GFR, Estimated: 19 mL/min — ABNORMAL LOW (ref >60)
Glucose, Bld: 116 mg/dL — ABNORMAL HIGH (ref 70–99)
Potassium: 3.6 mmol/L (ref 3.5–5.1)
Sodium: 135 mmol/L (ref 135–145)

## 2023-07-04 LAB — VITAMIN B12: Vitamin B-12: 116 pg/mL — ABNORMAL LOW (ref 180–914)

## 2023-07-04 LAB — FOLATE: Folate: 9.8 ng/mL (ref >5.9)

## 2023-07-04 LAB — PROTIME-INR
INR: 1.1 (ref 0.8–1.2)
Prothrombin Time: 14.4 s (ref 11.4–15.2)

## 2023-07-04 LAB — MAGNESIUM: Magnesium: 2.1 mg/dL (ref 1.7–2.4)

## 2023-07-04 MED ORDER — PIPERACILLIN-TAZOBACTAM IN DEX 2-0.25 GM/50ML IV SOLN
2.2500 g | Freq: Three times a day (TID) | INTRAVENOUS | Status: DC
  Administered 2023-07-04 – 2023-07-06 (×7): 2.25 g via INTRAVENOUS
  Filled 2023-07-04 (×11): qty 50

## 2023-07-04 MED ORDER — CALCITRIOL 0.25 MCG PO CAPS
0.2500 ug | ORAL_CAPSULE | Freq: Every day | ORAL | Status: DC
  Administered 2023-07-04 – 2023-07-07 (×4): 0.25 ug via ORAL
  Filled 2023-07-04 (×4): qty 1

## 2023-07-04 MED ORDER — SODIUM BICARBONATE 8.4 % IV SOLN
INTRAVENOUS | Status: DC
  Filled 2023-07-04: qty 150
  Filled 2023-07-04: qty 1000
  Filled 2023-07-04 (×3): qty 150

## 2023-07-04 NOTE — Assessment & Plan Note (Signed)
 New findings. Recommend tissue biopsy for definitive diagnosis Obtain CT chest noncontrast to rule out primary lung mass

## 2023-07-04 NOTE — Progress Notes (Signed)
 PHARMACY NOTE:  ANTIMICROBIAL RENAL DOSAGE ADJUSTMENT  Current antimicrobial regimen includes a mismatch between antimicrobial dosage and estimated renal function.  As per policy approved by the Pharmacy & Therapeutics and Medical Executive Committees, the antimicrobial dosage will be adjusted accordingly.  Current antimicrobial dosage:  zosyn  3.375 gm IV q6h  Indication: UTI  Renal Function:  Estimated Creatinine Clearance: 16.4 mL/min (A) (by C-G formula based on SCr of 2.65 mg/dL (H)). []      On intermittent HD, scheduled: []      On CRRT    Antimicrobial dosage has been changed to:  2.25 gm q8h (infuse over 30 min)  Thank you for allowing pharmacy to be a part of this patient's care.  Spurgeon Dyer, [facility] 07/04/2023 7:10 AM

## 2023-07-04 NOTE — Assessment & Plan Note (Signed)
 Agree with empiric IV antibiotics

## 2023-07-04 NOTE — Consult Note (Signed)
 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 Maury Space  Supervising Physician: Susan Ensign  Patient Status: [facility] - In-pt  History of Present Illness: Shelia Chan is a 68 y.o. female with history of urethral cancer (s/p cystectomy, urethrectomy, ileal conduit, bilateral nephrostomy), COPD, CHF, CKD, HCC, anxiety and depression. Patient is known to IR for routine nephroureteral exchanges, most recently 06/01/23 with next routine exch planned OP 6/19.  She was admitted on 07/02/2023 for UTI after presenting to ED with c/o abd pain, N/V x2 days. The CT AP 6/15 was notable for multiple hypodense liver lesions, measuring up to 2.7 cm. The team followed up with MR liver 6/16: 1. Innumerable peripherally enhancing foci throughout the liver, suspicious for hepatic metastases. 2. Multifocal enhancing, diffusion restricting foci within the spine and partially imaged pelvis, suspicious for osseous metastases, new compared to 12/29/2022. 3. Slight interval increase in size of an 8 mm left para-aortic lymph node, suspicious for nodal metastasis. 4. Similar severe left hydroureteronephrosis to the level the proximal left ureter. Bilateral ureteral stents in-situ. Layering fluid level within the left renal collecting system, which may reflect blood products or debris.  IP team consulted IR for bilateral nephrostomy tube routine exchange and liver lesion biopsy. Confirmed with Dr. Maury Space that team would like liver lesion biopsy prioritized as these will be performed in separate procedural areas on separate encounters.  Confirms NPO since 8am. Understands that this limits her sedation case to being after 2pm.   Does not use supplemental home O2.  Denies fever, chills, SOB, CP, sore throat, vomiting, blood in  stool or urine, abnormal bruising, leg swelling, back pain. Endorses intermittent nausea, LUQ abd pain, L low back pain, and suprapubic discomfort. She states that knowing now that she likely has osseous lesions to her pelvis, that the pain that she has been experiencing makes much more sense to her.   She is well spoken and pleasant, sits on edge of bed for interview.   Allergies Reviewed:  Metoprolol , Shellfish-derived products, and Tape   Patient is DNR/DNI  Past Medical History:  Diagnosis Date   Arthritis    Asthma    Blood transfusion without reported diagnosis    Cancer (HCC)    CHF (congestive heart failure) (HCC)    COPD (chronic obstructive pulmonary disease) (HCC)    Heart murmur    Immune deficiency disorder (HCC)    Renal disorder     Past Surgical History:  Procedure Laterality Date   ABDOMINAL HYSTERECTOMY     BLADDER REMOVAL  118/22/2015   FRACTURE SURGERY Left 02/2018   pins   IR CATHETER TUBE CHANGE  04/15/2019   IR CATHETER TUBE CHANGE  04/15/2019   IR EXT NEPHROURETERAL CATH EXCHANGE  07/15/2019   IR EXT NEPHROURETERAL CATH EXCHANGE  10/14/2019   IR EXT NEPHROURETERAL CATH EXCHANGE  10/14/2019   IR EXT NEPHROURETERAL CATH EXCHANGE  01/06/2020   IR EXT NEPHROURETERAL CATH EXCHANGE  03/16/2020   IR EXT NEPHROURETERAL CATH EXCHANGE  05/25/2020   IR EXT NEPHROURETERAL CATH EXCHANGE  07/17/2020   IR EXT NEPHROURETERAL CATH EXCHANGE  07/17/2020   IR EXT NEPHROURETERAL CATH EXCHANGE  08/14/2020   IR EXT NEPHROURETERAL CATH EXCHANGE  09/25/2020   IR EXT NEPHROURETERAL CATH EXCHANGE  11/06/2020   IR EXT NEPHROURETERAL CATH EXCHANGE  12/25/2020   IR EXT  NEPHROURETERAL CATH EXCHANGE  02/09/2021   IR EXT NEPHROURETERAL CATH EXCHANGE  03/19/2021   IR EXT NEPHROURETERAL CATH EXCHANGE  03/19/2021   IR EXT NEPHROURETERAL CATH EXCHANGE  04/23/2021   IR EXT NEPHROURETERAL CATH EXCHANGE  04/23/2021   IR EXT NEPHROURETERAL CATH EXCHANGE  06/04/2021   IR EXT  NEPHROURETERAL CATH EXCHANGE  07/15/2021   IR EXT NEPHROURETERAL CATH EXCHANGE  09/02/2021   IR EXT NEPHROURETERAL CATH EXCHANGE  10/14/2021   IR EXT NEPHROURETERAL CATH EXCHANGE  11/25/2021   IR EXT NEPHROURETERAL CATH EXCHANGE  01/06/2022   IR EXT NEPHROURETERAL CATH EXCHANGE  02/17/2022   IR EXT NEPHROURETERAL CATH EXCHANGE  03/31/2022   IR EXT NEPHROURETERAL CATH EXCHANGE  05/12/2022   IR EXT NEPHROURETERAL CATH EXCHANGE  06/30/2022   IR EXT NEPHROURETERAL CATH EXCHANGE  08/18/2022   IR EXT NEPHROURETERAL CATH EXCHANGE  10/06/2022   IR EXT NEPHROURETERAL CATH EXCHANGE  11/24/2022   IR EXT NEPHROURETERAL CATH EXCHANGE  02/09/2023   IR EXT NEPHROURETERAL CATH EXCHANGE  03/23/2023   IR EXT NEPHROURETERAL CATH EXCHANGE  04/26/2023   IR EXT NEPHROURETERAL CATH EXCHANGE  06/01/2023   IR NEPHROSTOMY EXCHANGE LEFT  12/30/2022   IR NEPHROSTOMY EXCHANGE RIGHT  12/30/2022   IR PERC TUN PERIT CATH WO PORT S&I /IMAG  03/25/2021   IR REMOVAL TUN CV CATH W/O FL  06/04/2021      Medications: Prior to Admission medications   Medication Sig Start Date End Date Taking? Authorizing Provider  acetaminophen  (TYLENOL ) 500 MG tablet Take 500-1,000 mg by mouth every 6 (six) hours as needed for fever or mild pain (pain score 1-3) (or headaches).   Yes [provider]  albuterol  (VENTOLIN  HFA) 108 (90 Base) MCG/ACT inhaler Inhale 1 puff into the lungs every 6 (six) hours as needed for shortness of breath. 05/22/20  Yes [provider]  ARIPiprazole  (ABILIFY ) 30 MG tablet Take 30 mg by mouth daily. 03/19/19  Yes [provider]  aspirin  EC 81 MG tablet Take 81 mg by mouth daily.   Yes [provider]  atorvastatin  (LIPITOR) 40 MG tablet Take 1 tablet daily at bedtime. Patient taking differently: Take 40 mg by mouth in the morning. 03/17/22  Yes   calcitRIOL  (ROCALTROL ) 0.25 MCG capsule Take 0.25 mcg by mouth daily. 02/12/21  Yes [provider]  denosumab  (PROLIA ) 60 MG/ML  SOSY injection Inject 60 mg Esmeralda every 6 months 05/23/22  Yes   furosemide  (LASIX ) 40 MG tablet Take 40 mg by mouth daily. 10/13/22  Yes [provider]  oxyCODONE  (OXY IR/ROXICODONE ) 5 MG immediate release tablet Take 1 tablet (5 mg total) by mouth every 6 (six) hours as needed for severe pain 06/01/23  Yes   potassium chloride  (KLOR-CON ) 20 MEQ packet Take 20 mEq by mouth daily as needed (for cramping in the body).   Yes [provider]  oxyCODONE  (OXY IR/ROXICODONE ) 5 MG immediate release tablet Take 1 tablet (5 mg total) by mouth every 6 (six) hours as needed for severe pain Patient not taking: Reported on 07/02/2023 12/02/22     oxyCODONE  (OXY IR/ROXICODONE ) 5 MG immediate release tablet take 1 tablet (5 mg) by oral route every 6 hours as needed for severe pain Patient not taking: Reported on 07/02/2023 01/02/23     oxyCODONE  (OXY IR/ROXICODONE ) 5 MG immediate release tablet Take 1 tablet (5 mg total) by mouth every 6 (six) hours as needed for severe pain Patient not taking: Reported on 07/02/2023 05/01/23  Family History  Problem Relation Age of Onset   Heart disease Maternal Grandfather     Social History   Socioeconomic History   Marital status: Divorced    Spouse name: Not on file   Number of children: Not on file   Years of education: Not on file   Highest education level: Not on file  Occupational History   Occupation: disabled  Tobacco Use   Smoking status: Some Days    Current packs/day: 0.00    Average packs/day: 1 pack/day for 50.0 years (50.0 ttl pk-yrs)    Types: Cigarettes, E-cigarettes    Start date: 01/20/1969    Last attempt to quit: 01/21/2019    Years since quitting: 4.4   Smokeless tobacco: Never   Tobacco comments:    Quit smoking but vapes occasionally  Vaping Use   Vaping status: Never Used  Substance and Sexual Activity   Alcohol use: Not Currently   Drug use: Yes    Types: Marijuana    Comment: occasional    Sexual activity: Not on  file  Other Topics Concern   Not on file  Social History Narrative   Not on file   Social Drivers of Health   Financial Resource Strain: Not on file  Food Insecurity: No Food Insecurity (07/02/2023)   Hunger Vital Sign    Worried About Running Out of Food in the Last Year: Never true    Ran Out of Food in the Last Year: Never true  Transportation Needs: No Transportation Needs (07/02/2023)   PRAPARE - Administrator, Civil Service (Medical): No    Lack of Transportation (Non-Medical): No  Physical Activity: Not on file  Stress: Not on file  Social Connections: Moderately Integrated (07/02/2023)   Social Connection and Isolation Panel    Frequency of Communication with Friends and Family: More than three times a week    Frequency of Social Gatherings with Friends and Family: Three times a week    Attends Religious Services: More than 4 times per year    Active Member of Clubs or Organizations: Yes    Attends Banker Meetings: More than 4 times per year    Marital Status: Divorced     Review of Systems: A 12 point ROS discussed and pertinent positives are indicated in the HPI above.  All other systems are negative.   Vital Signs: BP 126/76   Pulse 71   Temp 98.4 F (36.9 C) (Oral)   Resp 18   Wt 126 lb 15.8 oz (57.6 kg)   SpO2 97%   BMI 23.99 kg/m   Advance Care Plan:  No documents on file   Physical Exam HENT:     Mouth/Throat:     Mouth: Mucous membranes are dry.   Cardiovascular:     Rate and Rhythm: Normal rate and regular rhythm.  Pulmonary:     Effort: Pulmonary effort is normal.     Comments: Diminished bases Abdominal:     Palpations: Abdomen is soft.     Tenderness: There is abdominal tenderness.     Comments: Tender LUQ, ileostomy is well dressed with clear yellow urine in bag, no blood noted, stoma color appropriate, no obvious skin breakdown around dressing.    Musculoskeletal:     Right lower leg: Edema present.     Left  lower leg: Edema present.   Skin:    General: Skin is warm and dry.     Comments: No erythema or  wound RUQ abd   Neurological:     Mental Status: She is oriented to person, place, and time.   Psychiatric:        Mood and Affect: Mood normal.        Behavior: Behavior normal.        Thought Content: Thought content normal.        Judgment: Judgment normal.     Imaging: MR LIVER W WO CONTRAST Result Date: 07/03/2023 CLINICAL DATA:  History of urethral squamous cell carcinoma status post chemo radiation, cystectomy, and ileal conduit with new multifocal hepatic lesions EXAM: MRI ABDOMEN WITHOUT AND WITH CONTRAST TECHNIQUE: Multiplanar multisequence MR imaging of the abdomen was performed both before and after the administration of intravenous contrast. CONTRAST:  5mL GADAVIST  GADOBUTROL  1 MMOL/ML IV SOLN COMPARISON:  CT abdomen and pelvis dated 07/02/2023, 12/29/2022 FINDINGS: Lower chest: No acute findings. Hepatobiliary: Innumerable peripherally enhancing foci throughout the liver, for example 2.4 x 2.1 cm segment 3 (13:48) and 2.9 x 2.5 cm segment 6 (13:55). No bile duct dilation. Distended gallbladder. Pancreas: No mass, inflammatory changes, or other parenchymal abnormality identified. Spleen:  Within normal limits in size and appearance. Adrenals/Urinary Tract: No adrenal nodules. Similar severe left hydroureteronephrosis to the level the proximal left ureter. Bilateral ureteral stents in-situ. Interpolar right renal simple and hemorrhagic/proteinaceous cysts. Layering fluid level within the left renal collecting system. Stomach/Bowel: Visualized portions within the abdomen are unremarkable. Vascular/Lymphatic: Slight interval increase in size of an 8 mm left para-aortic lymph node (15:39), previously 6 mm. No abdominal aortic aneurysm demonstrated. Other:  Right lower quadrant ileal conduit with stents in-situ. Musculoskeletal: Multifocal enhancing, diffusion restricting foci within the spine  and partially imaged pelvis, suspicious for osseous metastases, for example 1.7 x 1.6 cm anterior segment L4 (15:56) and right transverse process of L2 (15:29). Partially imaged postsurgical changes of the left proximal femur. IMPRESSION: 1. Innumerable peripherally enhancing foci throughout the liver, suspicious for hepatic metastases. 2. Multifocal enhancing, diffusion restricting foci within the spine and partially imaged pelvis, suspicious for osseous metastases, new compared to 12/29/2022. 3. Slight interval increase in size of an 8 mm left para-aortic lymph node, suspicious for nodal metastasis. 4. Similar severe left hydroureteronephrosis to the level the proximal left ureter. Bilateral ureteral stents in-situ. Layering fluid level within the left renal collecting system, which may reflect blood products or debris. Electronically Signed   By: Limin  Xu M.D.   On: 07/03/2023 08:56   CT ABDOMEN PELVIS WO CONTRAST Result Date: 07/02/2023 CLINICAL DATA:  Abdominal pain, acute, nonlocalized. Left-sided flank pain radiating toward left side of abdomen. Urostomy bag with decreased output. EXAM: CT ABDOMEN AND PELVIS WITHOUT CONTRAST TECHNIQUE: Multidetector CT imaging of the abdomen and pelvis was performed following the standard protocol without IV contrast. RADIATION DOSE REDUCTION: This exam was performed according to the departmental dose-optimization program which includes automated exposure control, adjustment of the mA and/or kV according to patient size and/or use of iterative reconstruction technique. COMPARISON:  12/29/2022. FINDINGS: Lower chest: No acute abnormality. Hepatobiliary: Scattered ill-defined hypodensities are present in the liver measuring up to 2.7 cm, suspicious for metastatic disease. No biliary ductal dilatation is seen. The gallbladder is without stones. Pancreas: Unremarkable. No pancreatic ductal dilatation or surrounding inflammatory changes. Spleen: Normal in size without focal  abnormality. Adrenals/Urinary Tract: The adrenal glands are within normal limits. There is severe hydronephrosis on the left with renal cortical thinning. Ureteral stents are noted bilaterally in terminating in a right lower quadrant ileal  conduit and ostomy. No renal or ureteral calculus is seen. Peri ureteral fat stranding is present on the left, similar in appearance to the prior exam. The bladder is surgically absent. Stomach/Bowel: The stomach is within normal limits. The appendix is not seen. There are mildly distended loops of bowel in the right lower quadrant measuring up to 3.5 cm adjacent to the ileal conduit. No definite transition 0.18, pneumatosis, or free air is seen. Vascular/Lymphatic: Aortic atherosclerosis. No abdominal or pelvic lymphadenopathy is seen. Reproductive: Status post hysterectomy. No adnexal masses. Other: There is no free fluid in the pelvis. Musculoskeletal: Fixation hardware is noted in the proximal left femur. Degenerative changes are noted in the thoracolumbar spine. Sacral iliac attic an pubic symphysis sclerosis and fragmentation are unchanged. IMPRESSION: 1. Severe left hydronephrosis with renal cortical thinning unchanged. Bilateral ureteral stents are noted with right lower quadrant ileal conduit and ostomy. 2. Mildly distended loops of bowel in the right lower quadrant adjacent to the ostomy measuring up to 3.5 cm, possibly representing small bowel versus ileal conduit. Repeat evaluation with oral contrast may be beneficial for further evaluation. 3. Multiple hypodensities in the liver measuring up to 2.7 cm, suspicious for metastatic disease. Multiphase CT or MRI is recommended for further evaluation. 4. Aortic atherosclerosis. Electronically Signed   By: Wyvonnia Heimlich M.D.   On: 07/02/2023 16:34    Labs:  CBC: Recent Labs    06/01/23 1215 07/02/23 1803 07/03/23 0423 07/04/23 0500  WBC 7.1 10.9* 10.9* 10.0  HGB 12.5 11.6* 10.4* 10.8*  HCT 39.3 35.6* 32.0*  33.8*  PLT 373 330 284 279    COAGS: Recent Labs    04/26/23 1200 07/04/23 1048  INR 1.0 1.1    BMP: Recent Labs    06/01/23 1215 07/02/23 1627 07/03/23 0423 07/04/23 0500  NA 138 140 134* 135  K 3.7 3.3* 3.8 3.6  CL 110 107 108 106  CO2 16* 19* 18* 16*  GLUCOSE 110* 92 106* 116*  BUN 34* 25* 25* 26*  CALCIUM  8.8* 9.1 7.7* 7.7*  CREATININE 2.52* 2.29* 2.36* 2.65*  GFRNONAA 20* 23* 22* 19*    LIVER FUNCTION TESTS: Recent Labs    07/24/22 2214 12/29/22 1430 12/30/22 0556 07/02/23 1627  BILITOT 0.5 0.6 0.5 0.7  AST 16 12* 11* 19  ALT 15 9 8 17   ALKPHOS 113 63 63 79  PROT 7.2 6.0* 5.8* 7.6  ALBUMIN 3.4* 3.0* 2.9* 3.8    TUMOR MARKERS: No results for input(s): AFPTM, CEA, CA199, CHROMGRNA in the last 8760 hours.  Assessment and Plan:  Request for  image guided liver lesion biopsy and bilateral nephroureteral catheter exchange approved by Dr. Burna Carrier.  No contraindications for procedure identified in ROS, physical exam, or review of pre-sedation considerations. Labs reviewed and within acceptable range, WBC now WNL, INR 1.1 6/16 MR imaging available and reviewed VSS, afebrile today (fever 6/16)  Patient does not take a blood thinner On zosyn  from IP team  Will keep patient prepared for 6/17 procedure with understanding that will more likely be 6/18 that biopsy is completed, and 6/19 for tube exch.   Risks and benefits of neph tube exch discussed with the patient including bleeding, infection, damage to adjacent structures, bowel perforation/fistula connection, and sepsis.  Risks and benefits of liver lesion biopsy was discussed with the patient and/or patient's family including, but not limited to bleeding, infection, damage to adjacent structures or low yield requiring additional tests.  All of the questions were answered and  there is agreement to proceed.  Consent signed and in chart.  Thank you for allowing our service to participate in Tae  Tonni Mansour 's care.    Electronically Signed: Terressa Fess, NP   07/04/2023, 12:01 PM     I spent a total of 40 Minutes    in face to face in clinical consultation, greater than 50% of which was counseling/coordinating care for request for  image guided liver lesion biopsy and bilateral nephroureteral catheter exchange   (A copy of this note was sent to the referring provider and the time of visit.)

## 2023-07-04 NOTE — Plan of Care (Signed)

## 2023-07-04 NOTE — Assessment & Plan Note (Signed)
 History of definitive treatment completed in 2016. Will need biopsy for definitive diagnosis given the remote history

## 2023-07-04 NOTE — Progress Notes (Signed)
 PROGRESS NOTE    Shelia Chan  ZOX:[id number] DOB: [birth year] DOA: 07/02/2023 PCP: Ulysees Gander, MD   Brief Narrative:  68 y.o. female with hx of SCC of urethra s/p chemo/radiation, cystectomy and urethrectomy, LN dissection, ileal conduit, bilateral nephroureteral stents with routine stent exchanges by IR, severe L hydronephrosis, CKD stage IV, HFpEF, atrial tachycardia, COPD / asthma, Lymphedema and recurrent UTIs presented with left flank pain with nausea and vomiting.  On presentation, she was found to have possible UTI.  CT of abdomen and pelvis showed multiple hypodensities in the liver measuring up to 2.7 cm, suspicious for metastatic disease along with severe left hydronephrosis with renal cortical thinning, unchanged, and bilateral ureteral stents with right quadrant ileal conduit and ostomy.  She was started on IV fluids and antibiotics.  MRI of liver showed possible hepatic metastases along with osseous and nodal metastases.  Oncology was consulted.  Assessment & Plan:   Complicated UTI: Possibly related to her ileal conduit and nephroureteral stents History of ileal conduit with bilateral nephroureteral stents with routine exchanges by IR and severe left hydronephrosis - Urine cultures showed multiple species.  Will continue Zosyn  for now.  No temperature spikes over the last 24 hours. - IR has been consulted for exchange of bilateral nephroureteral stents  Multiple hypodense liver lesions, concern for metastatic disease History of SCC of urethra s/p chemo/radiation, cystectomy and urethrectomy - MRI of liver shows possible hepatic metastases along with osseous metastases, nodal metastases - Oncology complete appreciated: Oncology recommended liver biopsy.  I have consulted IR for the same  CKD stage IV - Baseline creatinine of 2.2-2.5.  Creatinine slightly up trended to 2.65 this morning.  Will give gentle hydration.  Monitor   hyponatremia -  Resolved  Hypokalemia -Resolved  Acute metabolic acidosis - Bicarb still 16 today.  Start bicarb drip.  Monitor  Anemia of chronic disease - From chronic illnesses and renal disease.  Hemoglobin stable.  Monitor intermittently  Hypomagnesemia - Improved  Hyperlipidemia -continue atorvastatin   Mood disorder - Continue aripiprazole  and duloxetine   History of lymphedema - Lasix  on hold for now  History of atrial tachycardia -rate controlled - Continue diltiazem   COPD/asthma -Stable.   DVT prophylaxis: SCDs Code Status: DNR Family Communication: None at bedside Disposition Plan: Status is: inpatient because: Of severity of illness    Consultants:  oncology and IR  Procedures: None  Antimicrobials: Zosyn  from 07/02/2023 onwards   Subjective: Patient seen and examined at bedside.  No fever, vomiting, worsening chest pain reported.  Flank pain improving.  Still feels weak. Objective: Vitals:   07/03/23 1412 07/03/23 2118 07/04/23 0457 07/04/23 1028  BP: (!) 104/50 (!) 102/58 126/76 126/76  Pulse: 65 67 71   Resp: 16 20 18    Temp: 98.9 F (37.2 C) 98.2 F (36.8 C) 98.4 F (36.9 C)   TempSrc: Oral Oral Oral   SpO2: 96% 95% 97%   Weight:        Intake/Output Summary (Last 24 hours) at 07/04/2023 1130 Last data filed at 07/04/2023 0600 Gross per 24 hour  Intake 340 ml  Output 2000 ml  Net -1660 ml   Filed Weights   07/02/23 2100  Weight: 57.6 kg    Examination:  General: On room air.  No distress.  Chronically ill and deconditioned looking. ENT/neck: No thyromegaly.  JVD is not elevated  respiratory: Decreased breath sounds at bases bilaterally with some crackles; no wheezing  CVS: S1-S2 heard, rate controlled currently Abdominal: Soft,  nontender, slightly distended; no organomegaly,  bowel sounds are heard  Genitourinary: Mild left flank tenderness present.  Ostomy with clear urine output present  extremities: Trace lower extremity edema; no  cyanosis  CNS: Awake and alert.  No focal neurologic deficit.  Moves extremities Lymph: No obvious lymphadenopathy Skin: No obvious ecchymosis/lesions  psych: Mostly flat affect with no signs of agitation musculoskeletal: No obvious joint swelling/deformity     Data Reviewed: I have personally reviewed following labs and imaging studies  CBC: Recent Labs  Lab 07/02/23 1803 07/03/23 0423 07/04/23 0500  WBC 10.9* 10.9* 10.0  NEUTROABS 8.3*  --  8.0*  HGB 11.6* 10.4* 10.8*  HCT 35.6* 32.0* 33.8*  MCV 95.2 95.2 97.1  PLT 330 284 279   Basic Metabolic Panel: Recent Labs  Lab 07/02/23 1627 07/03/23 0423 07/04/23 0500  NA 140 134* 135  K 3.3* 3.8 3.6  CL 107 108 106  CO2 19* 18* 16*  GLUCOSE 92 106* 116*  BUN 25* 25* 26*  CREATININE 2.29* 2.36* 2.65*  CALCIUM  9.1 7.7* 7.7*  MG  --  1.4* 2.1  PHOS  --  2.3*  --    GFR: Estimated Creatinine Clearance: 16.4 mL/min (A) (by C-G formula based on SCr of 2.65 mg/dL (H)). Liver Function Tests: Recent Labs  Lab 07/02/23 1627  AST 19  ALT 17  ALKPHOS 79  BILITOT 0.7  PROT 7.6  ALBUMIN 3.8   No results for input(s): LIPASE, AMYLASE in the last 168 hours. No results for input(s): AMMONIA in the last 168 hours. Coagulation Profile: Recent Labs  Lab 07/04/23 1048  INR 1.1   Cardiac Enzymes: No results for input(s): CKTOTAL, CKMB, CKMBINDEX, TROPONINI in the last 168 hours. BNP (last 3 results) No results for input(s): PROBNP in the last 8760 hours. HbA1C: No results for input(s): HGBA1C in the last 72 hours. CBG: No results for input(s): GLUCAP in the last 168 hours. Lipid Profile: No results for input(s): CHOL, HDL, LDLCALC, TRIG, CHOLHDL, LDLDIRECT in the last 72 hours. Thyroid Function Tests: No results for input(s): TSH, T4TOTAL, FREET4, T3FREE, THYROIDAB in the last 72 hours. Anemia Panel: No results for input(s): VITAMINB12, FOLATE, FERRITIN, TIBC, IRON,  RETICCTPCT in the last 72 hours. Sepsis Labs: No results for input(s): PROCALCITON, LATICACIDVEN in the last 168 hours.  Recent Results (from the past 240 hours)  Urine Culture     Status: Abnormal   Collection Time: 07/02/23  2:58 PM   Specimen: Urine, Clean Catch  Result Value Ref Range Status   Specimen Description   Final    URINE, CLEAN CATCH Performed at [facility], [address] [place], [place] [address]    Special Requests   Final    NONE Performed at [facility], [address] [place], [place] [address]    Culture MULTIPLE SPECIES PRESENT, SUGGEST RECOLLECTION (A)  Final   Report Status 07/03/2023 FINAL  Final         Radiology Studies: MR LIVER W WO CONTRAST Result Date: 07/03/2023 CLINICAL DATA:  History of urethral squamous cell carcinoma status post chemo radiation, cystectomy, and ileal conduit with new multifocal hepatic lesions EXAM: MRI ABDOMEN WITHOUT AND WITH CONTRAST TECHNIQUE: Multiplanar multisequence MR imaging of the abdomen was performed both before and after the administration of intravenous contrast. CONTRAST:  5mL GADAVIST  GADOBUTROL  1 MMOL/ML IV SOLN COMPARISON:  CT abdomen and pelvis dated 07/02/2023, 12/29/2022 FINDINGS: Lower chest: No acute findings. Hepatobiliary: Innumerable peripherally enhancing foci  throughout the liver, for example 2.4 x 2.1 cm segment 3 (13:48) and 2.9 x 2.5 cm segment 6 (13:55). No bile duct dilation. Distended gallbladder. Pancreas: No mass, inflammatory changes, or other parenchymal abnormality identified. Spleen:  Within normal limits in size and appearance. Adrenals/Urinary Tract: No adrenal nodules. Similar severe left hydroureteronephrosis to the level the proximal left ureter. Bilateral ureteral stents in-situ. Interpolar right renal simple and hemorrhagic/proteinaceous cysts. Layering fluid level within the left renal collecting system. Stomach/Bowel: Visualized portions  within the abdomen are unremarkable. Vascular/Lymphatic: Slight interval increase in size of an 8 mm left para-aortic lymph node (15:39), previously 6 mm. No abdominal aortic aneurysm demonstrated. Other:  Right lower quadrant ileal conduit with stents in-situ. Musculoskeletal: Multifocal enhancing, diffusion restricting foci within the spine and partially imaged pelvis, suspicious for osseous metastases, for example 1.7 x 1.6 cm anterior segment L4 (15:56) and right transverse process of L2 (15:29). Partially imaged postsurgical changes of the left proximal femur. IMPRESSION: 1. Innumerable peripherally enhancing foci throughout the liver, suspicious for hepatic metastases. 2. Multifocal enhancing, diffusion restricting foci within the spine and partially imaged pelvis, suspicious for osseous metastases, new compared to 12/29/2022. 3. Slight interval increase in size of an 8 mm left para-aortic lymph node, suspicious for nodal metastasis. 4. Similar severe left hydroureteronephrosis to the level the proximal left ureter. Bilateral ureteral stents in-situ. Layering fluid level within the left renal collecting system, which may reflect blood products or debris. Electronically Signed   By: Limin  Xu M.D.   On: 07/03/2023 08:56   CT ABDOMEN PELVIS WO CONTRAST Result Date: 07/02/2023 CLINICAL DATA:  Abdominal pain, acute, nonlocalized. Left-sided flank pain radiating toward left side of abdomen. Urostomy bag with decreased output. EXAM: CT ABDOMEN AND PELVIS WITHOUT CONTRAST TECHNIQUE: Multidetector CT imaging of the abdomen and pelvis was performed following the standard protocol without IV contrast. RADIATION DOSE REDUCTION: This exam was performed according to the departmental dose-optimization program which includes automated exposure control, adjustment of the mA and/or kV according to patient size and/or use of iterative reconstruction technique. COMPARISON:  12/29/2022. FINDINGS: Lower chest: No acute  abnormality. Hepatobiliary: Scattered ill-defined hypodensities are present in the liver measuring up to 2.7 cm, suspicious for metastatic disease. No biliary ductal dilatation is seen. The gallbladder is without stones. Pancreas: Unremarkable. No pancreatic ductal dilatation or surrounding inflammatory changes. Spleen: Normal in size without focal abnormality. Adrenals/Urinary Tract: The adrenal glands are within normal limits. There is severe hydronephrosis on the left with renal cortical thinning. Ureteral stents are noted bilaterally in terminating in a right lower quadrant ileal conduit and ostomy. No renal or ureteral calculus is seen. Peri ureteral fat stranding is present on the left, similar in appearance to the prior exam. The bladder is surgically absent. Stomach/Bowel: The stomach is within normal limits. The appendix is not seen. There are mildly distended loops of bowel in the right lower quadrant measuring up to 3.5 cm adjacent to the ileal conduit. No definite transition 0.18, pneumatosis, or free air is seen. Vascular/Lymphatic: Aortic atherosclerosis. No abdominal or pelvic lymphadenopathy is seen. Reproductive: Status post hysterectomy. No adnexal masses. Other: There is no free fluid in the pelvis. Musculoskeletal: Fixation hardware is noted in the proximal left femur. Degenerative changes are noted in the thoracolumbar spine. Sacral iliac attic an pubic symphysis sclerosis and fragmentation are unchanged. IMPRESSION: 1. Severe left hydronephrosis with renal cortical thinning unchanged. Bilateral ureteral stents are noted with right lower quadrant ileal conduit and ostomy. 2. Mildly distended loops  of bowel in the right lower quadrant adjacent to the ostomy measuring up to 3.5 cm, possibly representing small bowel versus ileal conduit. Repeat evaluation with oral contrast may be beneficial for further evaluation. 3. Multiple hypodensities in the liver measuring up to 2.7 cm, suspicious for  metastatic disease. Multiphase CT or MRI is recommended for further evaluation. 4. Aortic atherosclerosis. Electronically Signed   By: Wyvonnia Heimlich M.D.   On: 07/02/2023 16:34        Scheduled Meds:  ARIPiprazole   30 mg Oral Daily   atorvastatin   40 mg Oral Daily   calcitRIOL   0.25 mcg Oral Daily   diltiazem   120 mg Oral Daily   DULoxetine   30 mg Oral Daily   Continuous Infusions:  piperacillin -tazobactam (ZOSYN )  IV 2.25 g (07/04/23 1127)          Audria Leather, MD [facility] 07/04/2023, 11:30 AM

## 2023-07-04 NOTE — Assessment & Plan Note (Addendum)
 Borderline macrocytic anemia.  Will check B12, folate.

## 2023-07-04 NOTE — Progress Notes (Signed)
 Shelia Chan   DOB:01-16-1956   ZO#:[id number]    ASSESSMENT & PLAN:   Shelia Chan 68 y.o. female with medical history significant for SCC urethra, CKD stage IV, 50 pack smoking history, COPD, asthma who was admitted on 07/02/2023 with complaints of abdominal pain found to have diffuse liver lesions.    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  She has been in remission since.  Clinically, this may or may not be related to prior malignancy.  She has 50 pack smoking history.  Clinically reported more short of breath over the last 6 months.  I recommend CT chest noncontrast.  This can still identified if there is a dominant pulmonary mass.  Currently she has no neurologic symptoms.  Pending biopsy tomorrow.  Recommend obtain a tissue core biopsy for diangosis Assessment & Plan Liver lesion New findings. Recommend tissue biopsy for definitive diagnosis Obtain CT chest noncontrast to rule out primary lung mass Complicated UTI (urinary tract infection) Agree with empiric IV antibiotics Squamous cell carcinoma of the urethra History of definitive treatment completed in 2016. Will need biopsy for definitive diagnosis given the remote history History of ileal conduit Borderline macrocytic anemia.  Will check B12, folate.  All questions were answered.  Thank you for the consult.  Will follow-up again on Thursday.    Shelia Ruddy, MD 07/04/2023 5:47 PM  Subjective:  Shelia Chan reports feeling more short of breath over the last few months.  No increased cough.  She had 50 pack smoking history and quit about 5 years ago.  She has abdominal pain.  No headaches, focal weakness, visual change.  She denies any bleeding, hematuria, bloody stool.  Objective:  Vitals:   07/04/23 1028 07/04/23 1314   BP: 126/76 107/63  Pulse:  64  Resp:  18  Temp:  98.2 F (36.8 C)  SpO2:  97%     Intake/Output Summary (Last 24 hours) at 07/04/2023 1747 Last data filed at 07/04/2023 1522 Gross per 24 hour  Intake 474.55 ml  Output 2500 ml  Net -2025.45 ml    GENERAL: alert, no distress and comfortable SKIN: skin color normal EYES: sclera clear OROPHARYNX: dry, no exudate, no erythema.  NECK: supple, no palpable mass LYMPH:  no palpable cervical lymphadenopathy LUNGS: No wheeze, rales and clear to auscultation bilaterally with normal breathing effort.  HEART: regular rate & rhythm  ABDOMEN: abdomen soft, non-tender and non-distended Urostomy in place.  Clear urine. Musculoskeletal: Bilateral lower extremity edema NEURO: alert with fluent speech, no focal motor/sensory deficits   Labs:  Recent Labs    12/29/22 1430 12/29/22 1518 12/30/22 0556 12/31/22 0746 07/02/23 1627 07/03/23 0423 07/04/23 0500  NA 139   < > 135   < > 140 134* 135  K 3.4*   < > 3.5   < > 3.3* 3.8 3.6  CL 111   < > 106   < > 107 108 106  CO2 19*  --  19*   < > 19* 18* 16*  GLUCOSE 110*   < > 99   < > 92 106* 116*  BUN 30*   < > 33*   < > 25* 25* 26*  CREATININE 1.97*   < > 2.11*   < > 2.29* 2.36* 2.65*  CALCIUM  7.4*  --  7.4*   < >  9.1 7.7* 7.7*  GFRNONAA 27*  --  25*   < > 23* 22* 19*  PROT 6.0*  --  5.8*  --  7.6  --   --   ALBUMIN 3.0*  --  2.9*  --  3.8  --   --   AST 12*  --  11*  --  19  --   --   ALT 9  --  8  --  17  --   --   ALKPHOS 63  --  63  --  79  --   --   BILITOT 0.6  --  0.5  --  0.7  --   --    < > = values in this interval not displayed.    Studies:  MR LIVER W WO CONTRAST Result Date: 07/03/2023 CLINICAL DATA:  History of urethral squamous cell carcinoma status post chemo radiation, cystectomy, and ileal conduit with new multifocal hepatic lesions EXAM: MRI ABDOMEN WITHOUT AND WITH CONTRAST TECHNIQUE: Multiplanar multisequence MR imaging of the abdomen was performed both before and  after the administration of intravenous contrast. CONTRAST:  5mL GADAVIST  GADOBUTROL  1 MMOL/ML IV SOLN COMPARISON:  CT abdomen and pelvis dated 07/02/2023, 12/29/2022 FINDINGS: Lower chest: No acute findings. Hepatobiliary: Innumerable peripherally enhancing foci throughout the liver, for example 2.4 x 2.1 cm segment 3 (13:48) and 2.9 x 2.5 cm segment 6 (13:55). No bile duct dilation. Distended gallbladder. Pancreas: No mass, inflammatory changes, or other parenchymal abnormality identified. Spleen:  Within normal limits in size and appearance. Adrenals/Urinary Tract: No adrenal nodules. Similar severe left hydroureteronephrosis to the level the proximal left ureter. Bilateral ureteral stents in-situ. Interpolar right renal simple and hemorrhagic/proteinaceous cysts. Layering fluid level within the left renal collecting system. Stomach/Bowel: Visualized portions within the abdomen are unremarkable. Vascular/Lymphatic: Slight interval increase in size of an 8 mm left para-aortic lymph node (15:39), previously 6 mm. No abdominal aortic aneurysm demonstrated. Other:  Right lower quadrant ileal conduit with stents in-situ. Musculoskeletal: Multifocal enhancing, diffusion restricting foci within the spine and partially imaged pelvis, suspicious for osseous metastases, for example 1.7 x 1.6 cm anterior segment L4 (15:56) and right transverse process of L2 (15:29). Partially imaged postsurgical changes of the left proximal femur. IMPRESSION: 1. Innumerable peripherally enhancing foci throughout the liver, suspicious for hepatic metastases. 2. Multifocal enhancing, diffusion restricting foci within the spine and partially imaged pelvis, suspicious for osseous metastases, new compared to 12/29/2022. 3. Slight interval increase in size of an 8 mm left para-aortic lymph node, suspicious for nodal metastasis. 4. Similar severe left hydroureteronephrosis to the level the proximal left ureter. Bilateral ureteral stents in-situ.  Layering fluid level within the left renal collecting system, which may reflect blood products or debris. Electronically Signed   By: Limin  Xu M.D.   On: 07/03/2023 08:56   CT ABDOMEN PELVIS WO CONTRAST Result Date: 07/02/2023 CLINICAL DATA:  Abdominal pain, acute, nonlocalized. Left-sided flank pain radiating toward left side of abdomen. Urostomy bag with decreased output. EXAM: CT ABDOMEN AND PELVIS WITHOUT CONTRAST TECHNIQUE: Multidetector CT imaging of the abdomen and pelvis was performed following the standard protocol without IV contrast. RADIATION DOSE REDUCTION: This exam was performed according to the departmental dose-optimization program which includes automated exposure control, adjustment of the mA and/or kV according to patient size and/or use of iterative reconstruction technique. COMPARISON:  12/29/2022. FINDINGS: Lower chest: No acute abnormality. Hepatobiliary: Scattered ill-defined hypodensities are present in the liver measuring up to 2.7 cm, suspicious for  metastatic disease. No biliary ductal dilatation is seen. The gallbladder is without stones. Pancreas: Unremarkable. No pancreatic ductal dilatation or surrounding inflammatory changes. Spleen: Normal in size without focal abnormality. Adrenals/Urinary Tract: The adrenal glands are within normal limits. There is severe hydronephrosis on the left with renal cortical thinning. Ureteral stents are noted bilaterally in terminating in a right lower quadrant ileal conduit and ostomy. No renal or ureteral calculus is seen. Peri ureteral fat stranding is present on the left, similar in appearance to the prior exam. The bladder is surgically absent. Stomach/Bowel: The stomach is within normal limits. The appendix is not seen. There are mildly distended loops of bowel in the right lower quadrant measuring up to 3.5 cm adjacent to the ileal conduit. No definite transition 0.18, pneumatosis, or free air is seen. Vascular/Lymphatic: Aortic  atherosclerosis. No abdominal or pelvic lymphadenopathy is seen. Reproductive: Status post hysterectomy. No adnexal masses. Other: There is no free fluid in the pelvis. Musculoskeletal: Fixation hardware is noted in the proximal left femur. Degenerative changes are noted in the thoracolumbar spine. Sacral iliac attic an pubic symphysis sclerosis and fragmentation are unchanged. IMPRESSION: 1. Severe left hydronephrosis with renal cortical thinning unchanged. Bilateral ureteral stents are noted with right lower quadrant ileal conduit and ostomy. 2. Mildly distended loops of bowel in the right lower quadrant adjacent to the ostomy measuring up to 3.5 cm, possibly representing small bowel versus ileal conduit. Repeat evaluation with oral contrast may be beneficial for further evaluation. 3. Multiple hypodensities in the liver measuring up to 2.7 cm, suspicious for metastatic disease. Multiphase CT or MRI is recommended for further evaluation. 4. Aortic atherosclerosis. Electronically Signed   By: Wyvonnia Heimlich M.D.   On: 07/02/2023 16:34

## 2023-07-04 NOTE — Progress Notes (Signed)
 Mobility Specialist - Progress Note   07/04/23 1035  Mobility  Activity Ambulated independently in hallway  Level of Assistance Independent  Assistive Device None  Distance Ambulated (ft) 200 ft  Activity Response Tolerated well  Mobility Referral Yes  Mobility visit 1 Mobility  Mobility Specialist Start Time (ACUTE ONLY) 0956  Mobility Specialist Stop Time (ACUTE ONLY) 1004  Mobility Specialist Time Calculation (min) (ACUTE ONLY) 8 min   Pt received in bed and agreeable to mobility. No complaints during session. Pt to bed after session with all needs met.    [facility]

## 2023-07-04 NOTE — Progress Notes (Signed)
 Spoke with IP MD about IR procedure requests. These will occur in separate areas in IR with different staff and will occur in separate encounters. Team would like liver bx prioritized over bilat nephrouret tube change.   Procedures approved by Dr. Burna Carrier. Patient NPO, last ate 8am for possible biopsy today; however, this will more likely occur tomorrow based on staffing and schedule constraints.

## 2023-07-04 NOTE — TOC Progression Note (Signed)
 Transition of Care [facility]) - Progression Note    Patient Details  Name: Shelia Chan MRN: [id number] Date of Birth: 1955/02/19  [facility]) CM/SW Contact  Kathryn Parish, RN Phone Number: 07/04/2023, 2:34 PM  Clinical Narrative:    CM following for liver biopsy recommendation and to request MD for a palliative consult.   Expected Discharge Plan: Home/Self Care Barriers to Discharge: Continued Medical Work up  Expected Discharge Plan and Services   Discharge Planning Services: NA Post Acute Care Choice: NA Living arrangements for the past 2 months: Single Family Home                 DME Arranged: N/A DME Agency: NA       HH Arranged: NA HH Agency: NA         Social Determinants of Health (SDOH) Interventions SDOH Screenings   Food Insecurity: No Food Insecurity (07/02/2023)  Housing: Low Risk  (07/02/2023)  Transportation Needs: No Transportation Needs (07/02/2023)  Utilities: Not At Risk (07/02/2023)  Depression (PHQ2-9): Low Risk  (01/27/2022)  Social Connections: Moderately Integrated (07/02/2023)  Tobacco Use: High Risk (07/02/2023)    Readmission Risk Interventions     No data to display

## 2023-07-05 ENCOUNTER — Inpatient Hospital Stay (HOSPITAL_COMMUNITY)

## 2023-07-05 DIAGNOSIS — N390 Urinary tract infection, site not specified: Secondary | ICD-10-CM | POA: Diagnosis not present

## 2023-07-05 LAB — CBC WITH DIFFERENTIAL/PLATELET
Abs Immature Granulocytes: 0.03 10*3/uL (ref 0.00–0.07)
Basophils Absolute: 0 10*3/uL (ref 0.0–0.1)
Basophils Relative: 1 %
Eosinophils Absolute: 0.2 10*3/uL (ref 0.0–0.5)
Eosinophils Relative: 3 %
HCT: 30 % — ABNORMAL LOW (ref 36.0–46.0)
Hemoglobin: 9.8 g/dL — ABNORMAL LOW (ref 12.0–15.0)
Immature Granulocytes: 0 %
Lymphocytes Relative: 15 %
Lymphs Abs: 1.2 10*3/uL (ref 0.7–4.0)
MCH: 30.4 pg (ref 26.0–34.0)
MCHC: 32.7 g/dL (ref 30.0–36.0)
MCV: 93.2 fL (ref 80.0–100.0)
Monocytes Absolute: 0.8 10*3/uL (ref 0.1–1.0)
Monocytes Relative: 10 %
Neutro Abs: 5.5 10*3/uL (ref 1.7–7.7)
Neutrophils Relative %: 71 %
Platelets: 267 10*3/uL (ref 150–400)
RBC: 3.22 MIL/uL — ABNORMAL LOW (ref 3.87–5.11)
RDW: 13.5 % (ref 11.5–15.5)
WBC: 7.8 10*3/uL (ref 4.0–10.5)
nRBC: 0 % (ref 0.0–0.2)

## 2023-07-05 LAB — BASIC METABOLIC PANEL WITH GFR
Anion gap: 12 (ref 5–15)
BUN: 23 mg/dL (ref 8–23)
CO2: 21 mmol/L — ABNORMAL LOW (ref 22–32)
Calcium: 7.1 mg/dL — ABNORMAL LOW (ref 8.9–10.3)
Chloride: 100 mmol/L (ref 98–111)
Creatinine, Ser: 2.73 mg/dL — ABNORMAL HIGH (ref 0.44–1.00)
GFR, Estimated: 18 mL/min — ABNORMAL LOW (ref >60)
Glucose, Bld: 120 mg/dL — ABNORMAL HIGH (ref 70–99)
Potassium: 3.1 mmol/L — ABNORMAL LOW (ref 3.5–5.1)
Sodium: 133 mmol/L — ABNORMAL LOW (ref 135–145)

## 2023-07-05 LAB — MAGNESIUM: Magnesium: 1.8 mg/dL (ref 1.7–2.4)

## 2023-07-05 MED ORDER — SODIUM CHLORIDE 0.9 % IV SOLN: INTRAVENOUS | Status: AC

## 2023-07-05 MED ORDER — LIDOCAINE HCL 1 % IJ SOLN
INTRAMUSCULAR | Status: AC | PRN
  Administered 2023-07-05: 5 mL via INTRADERMAL

## 2023-07-05 MED ORDER — POTASSIUM CHLORIDE CRYS ER 20 MEQ PO TBCR
40.0000 meq | EXTENDED_RELEASE_TABLET | ORAL | Status: AC
  Administered 2023-07-05 (×2): 40 meq via ORAL
  Filled 2023-07-05: qty 2

## 2023-07-05 MED ORDER — HYALURONIDASE HUMAN 150 UNIT/ML IJ SOLN
150.0000 [IU] | Freq: Once | INTRAMUSCULAR | Status: AC
  Administered 2023-07-05: 150 [IU] via SUBCUTANEOUS
  Filled 2023-07-05: qty 1

## 2023-07-05 MED ORDER — LIDOCAINE HCL 1 % IJ SOLN
INTRAMUSCULAR | Status: AC
  Filled 2023-07-05: qty 20

## 2023-07-05 MED ORDER — FENTANYL CITRATE (PF) 100 MCG/2ML IJ SOLN
INTRAMUSCULAR | Status: AC
Start: 2023-07-05 — End: 2023-07-05
  Filled 2023-07-05: qty 2

## 2023-07-05 MED ORDER — FENTANYL CITRATE (PF) 100 MCG/2ML IJ SOLN
INTRAMUSCULAR | Status: AC | PRN
  Administered 2023-07-05: 50 ug via INTRAVENOUS

## 2023-07-05 MED ORDER — SODIUM CHLORIDE 0.9 % IV SOLN
INTRAVENOUS | Status: AC | PRN
  Administered 2023-07-05: 125 mL/h via INTRAVENOUS

## 2023-07-05 MED ORDER — MIDAZOLAM HCL 2 MG/2ML IJ SOLN
INTRAMUSCULAR | Status: AC | PRN
  Administered 2023-07-05: 1 mg via INTRAVENOUS

## 2023-07-05 MED ORDER — MIDAZOLAM HCL 2 MG/2ML IJ SOLN
INTRAMUSCULAR | Status: AC | PRN
Start: 2023-07-05 — End: 2023-07-05
  Administered 2023-07-05: 1 mg via INTRAVENOUS

## 2023-07-05 MED ORDER — MIDAZOLAM HCL 2 MG/2ML IJ SOLN
INTRAMUSCULAR | Status: AC
  Filled 2023-07-05: qty 2

## 2023-07-05 NOTE — Plan of Care (Signed)

## 2023-07-05 NOTE — Progress Notes (Addendum)
 PROGRESS NOTE    Shelia Chan  LKG:[id number] DOB: March 02, 1955 DOA: 07/02/2023 PCP: Ulysees Gander, MD   Brief Narrative:  68 y.o. female with hx of SCC of urethra s/p chemo/radiation, cystectomy and urethrectomy, LN dissection, ileal conduit, bilateral nephroureteral stents with routine stent exchanges by IR, severe L hydronephrosis, CKD stage IV, HFpEF, atrial tachycardia, COPD / asthma, Lymphedema and recurrent UTIs presented with left flank pain with nausea and vomiting.  On presentation, she was found to have possible UTI.  CT of abdomen and pelvis showed multiple hypodensities in the liver measuring up to 2.7 cm, suspicious for metastatic disease along with severe left hydronephrosis with renal cortical thinning, unchanged, and bilateral ureteral stents with right quadrant ileal conduit and ostomy.  She was started on IV fluids and antibiotics.  MRI of liver showed possible hepatic metastases along with osseous and nodal metastases.  Oncology was consulted.   Assessment & Plan:   Principal Problem:   Complicated UTI (urinary tract infection) Active Problems:   Squamous cell carcinoma of the urethra   Liver lesion   History of ileal conduit  Complicated UTI: Possibly related to her ileal conduit and nephroureteral stents History of ileal conduit with bilateral nephroureteral stents with routine exchanges by IR and severe left hydronephrosis - Urine cultures showed multiple species.  Afebrile.  Continue Zosyn . - IR has been consulted for exchange of bilateral nephroureteral stents   Multiple hypodense liver lesions, concern for metastatic disease History of SCC of urethra s/p chemo/radiation, cystectomy and urethrectomy - MRI of liver shows possible hepatic metastases along with osseous metastases, nodal metastases - Oncology complete appreciated: Oncology recommended liver biopsy.  IR consulted, pending biopsy today.   AKI on CKD stage IV - Baseline creatinine of  2.2-2.5.  Creatinine trending up and 2.73 today.  Gentle hydration for next 12 hours, repeat labs in the morning.   hyponatremia - Resolved but slightly lower now but stable and asymptomatic.   Hypokalemia - Low again, will replenish.   Acute metabolic acidosis - Bicarb improved to 21.  Completed bicarb drip.   Anemia of chronic disease - From chronic illnesses and renal disease.  Hemoglobin stable.  Monitor intermittently   Hypomagnesemia - Improved   Hyperlipidemia -continue atorvastatin    Mood disorder - Continue aripiprazole  and duloxetine    History of lymphedema - Lasix  on hold for now   History of atrial tachycardia -rate controlled - Continue diltiazem    COPD/asthma -Stable.  DVT prophylaxis: SCDs Start: 07/02/23 2041   Code Status: Limited: Do not attempt resuscitation (DNR) -DNR-LIMITED -Do Not Intubate/DNI   Family Communication:  None present at bedside.  Plan of care discussed with patient in length and he/she verbalized understanding and agreed with it.  Status is: Inpatient Remains inpatient appropriate because: Pending liver biopsy today   Estimated body mass index is 23.99 kg/m as calculated from the following:   Height as of this encounter: 5' 1 (1.549 m).   Weight as of this encounter: 57.6 kg.    Nutritional Assessment: Body mass index is 23.99 kg/m.Aaron Aas Seen by dietician.  I agree with the assessment and plan as outlined below: Nutrition Status:        . Skin Assessment: I have examined the patient's skin and I agree with the wound assessment as performed by the wound care RN as outlined below:    Consultants:  IR and oncology  Procedures:  As above  Antimicrobials:  Anti-infectives (From admission, onward)    Start  Dose/Rate Route Frequency Ordered Stop   07/04/23 1200  piperacillin -tazobactam (ZOSYN ) IVPB 2.25 g        2.25 g 100 mL/hr over 30 Minutes Intravenous Every 8 hours 07/04/23 0713     07/03/23 0400   piperacillin -tazobactam (ZOSYN ) IVPB 3.375 g  Status:  Discontinued        3.375 g 100 mL/hr over 30 Minutes Intravenous Every 6 hours 07/02/23 2102 07/04/23 0712   07/02/23 2100  piperacillin -tazobactam (ZOSYN ) IVPB 3.375 g        3.375 g 100 mL/hr over 30 Minutes Intravenous  Once 07/02/23 2054 07/02/23 2212         Subjective: Patient seen and examined.  She says that  I have felt better when I asked her to elaborate, she had no specific complaints.  She is just feeling overwhelmed psychologically.  Objective: Vitals:   07/04/23 1028 07/04/23 1314 07/04/23 2055 07/05/23 0540  BP: 126/76 107/63 112/68 118/60  Pulse:  64 64 60  Resp:  18 18 18   Temp:  98.2 F (36.8 C) 98.2 F (36.8 C) 97.8 F (36.6 C)  TempSrc:  Oral Oral Oral  SpO2:  97% 96% 97%  Weight:  57.6 kg    Height:  5' 1 (1.549 m)      Intake/Output Summary (Last 24 hours) at 07/05/2023 1132 Last data filed at 07/05/2023 0600 Gross per 24 hour  Intake 234.55 ml  Output 1550 ml  Net -1315.45 ml   Filed Weights   07/02/23 2100 07/04/23 1314  Weight: 57.6 kg 57.6 kg    Examination:  General exam: Appears calm and comfortable  Respiratory system: Clear to auscultation. Respiratory effort normal. Cardiovascular system: S1 & S2 heard, RRR. No JVD, murmurs, rubs, gallops or clicks. No pedal edema. Gastrointestinal system: Abdomen is nondistended, soft and mild left lower quadrant tenderness. No organomegaly or masses felt. Normal bowel sounds heard.  Ileal conduit noted. Central nervous system: Alert and oriented. No focal neurological deficits. Extremities: Symmetric 5 x 5 power. Skin: No rashes, lesions or ulcers Psychiatry: Judgement and insight appear normal. Mood & affect appropriate.    Data Reviewed: I have personally reviewed following labs and imaging studies  CBC: Recent Labs  Lab 07/02/23 1803 07/03/23 0423 07/04/23 0500 07/05/23 0426  WBC 10.9* 10.9* 10.0 7.8  NEUTROABS 8.3*  --  8.0*  5.5  HGB 11.6* 10.4* 10.8* 9.8*  HCT 35.6* 32.0* 33.8* 30.0*  MCV 95.2 95.2 97.1 93.2  PLT 330 284 279 267   Basic Metabolic Panel: Recent Labs  Lab 07/02/23 1627 07/03/23 0423 07/04/23 0500 07/05/23 0426  NA 140 134* 135 133*  K 3.3* 3.8 3.6 3.1*  CL 107 108 106 100  CO2 19* 18* 16* 21*  GLUCOSE 92 106* 116* 120*  BUN 25* 25* 26* 23  CREATININE 2.29* 2.36* 2.65* 2.73*  CALCIUM  9.1 7.7* 7.7* 7.1*  MG  --  1.4* 2.1 1.8  PHOS  --  2.3*  --   --    GFR: Estimated Creatinine Clearance: 15.9 mL/min (A) (by C-G formula based on SCr of 2.73 mg/dL (H)). Liver Function Tests: Recent Labs  Lab 07/02/23 1627  AST 19  ALT 17  ALKPHOS 79  BILITOT 0.7  PROT 7.6  ALBUMIN 3.8   No results for input(s): LIPASE, AMYLASE in the last 168 hours. No results for input(s): AMMONIA in the last 168 hours. Coagulation Profile: Recent Labs  Lab 07/04/23 1048  INR 1.1   Cardiac Enzymes: No results  for input(s): CKTOTAL, CKMB, CKMBINDEX, TROPONINI in the last 168 hours. BNP (last 3 results) No results for input(s): PROBNP in the last 8760 hours. HbA1C: No results for input(s): HGBA1C in the last 72 hours. CBG: No results for input(s): GLUCAP in the last 168 hours. Lipid Profile: No results for input(s): CHOL, HDL, LDLCALC, TRIG, CHOLHDL, LDLDIRECT in the last 72 hours. Thyroid Function Tests: No results for input(s): TSH, T4TOTAL, FREET4, T3FREE, THYROIDAB in the last 72 hours. Anemia Panel: Recent Labs    07/04/23 1819  VITAMINB12 116*  FOLATE 9.8   Sepsis Labs: No results for input(s): PROCALCITON, LATICACIDVEN in the last 168 hours.  Recent Results (from the past 240 hours)  Urine Culture     Status: Abnormal   Collection Time: 07/02/23  2:58 PM   Specimen: Urine, Clean Catch  Result Value Ref Range Status   Specimen Description   Final    URINE, CLEAN CATCH Performed at [facility], [address],  [place], [place] [address]    Special Requests   Final    NONE Performed at [facility], [address], [place], [place] [address]    Culture MULTIPLE SPECIES PRESENT, SUGGEST RECOLLECTION (A)  Final   Report Status 07/03/2023 FINAL  Final     Radiology Studies: No results found.  Scheduled Meds:  ARIPiprazole   30 mg Oral Daily   atorvastatin   40 mg Oral Daily   calcitRIOL   0.25 mcg Oral Daily   diltiazem   120 mg Oral Daily   DULoxetine   30 mg Oral Daily   potassium chloride   40 mEq Oral Q4H   Continuous Infusions:  sodium chloride      piperacillin -tazobactam (ZOSYN )  IV 2.25 g (07/05/23 0320)   sodium bicarbonate  150 mEq in dextrose  5 % 1,150 mL infusion Stopped (07/05/23 0912)     LOS: 2 days   Modena Andes, MD [facility]  07/05/2023, 11:32 AM   *Please note that this is a verbal dictation therefore any spelling or grammatical errors are due to the Dragon Medical One system interpretation.  Please page via Amion and do not message via secure chat for urgent patient care matters. Secure chat can be used for non urgent patient care matters.  How to contact the [facility] Attending or Consulting provider 7A - 7P or covering provider during after hours 7P -7A, for this patient?  Check the care team in [facility] and look for a) attending/consulting TRH provider listed and b) the TR[facility] team listed. Page or secure chat 7A-7P. Log into [email or web address] and use 's universal password to access. If you do not have the password, please contact the hospital operator. Locate the [facility] provider you are looking for under [facility] and page to a number that you can be directly reached. If you still have difficulty reaching the provider, please page the [facility] (Director on Call) for the Hospitalists listed on amion for assistance.

## 2023-07-05 NOTE — Procedures (Signed)
  Procedure:  US  core biopsy liver lesion R lobe Preprocedure diagnosis: urothelial carcinoma Postprocedure diagnosis: same EBL:    minimal Complications:   none immediate  See full dictation in [employer].  Nicky Barrack MD Main # [phone] Pager  [phone] Mobile [phone]

## 2023-07-06 ENCOUNTER — Other Ambulatory Visit (HOSPITAL_COMMUNITY)

## 2023-07-06 ENCOUNTER — Encounter (HOSPITAL_COMMUNITY): Payer: Self-pay

## 2023-07-06 ENCOUNTER — Inpatient Hospital Stay (HOSPITAL_COMMUNITY)

## 2023-07-06 DIAGNOSIS — D518 Other vitamin B12 deficiency anemias: Secondary | ICD-10-CM

## 2023-07-06 DIAGNOSIS — R160 Hepatomegaly, not elsewhere classified: Secondary | ICD-10-CM

## 2023-07-06 DIAGNOSIS — Z9889 Other specified postprocedural states: Secondary | ICD-10-CM | POA: Diagnosis not present

## 2023-07-06 DIAGNOSIS — C680 Malignant neoplasm of urethra: Secondary | ICD-10-CM | POA: Diagnosis not present

## 2023-07-06 DIAGNOSIS — N390 Urinary tract infection, site not specified: Secondary | ICD-10-CM | POA: Diagnosis not present

## 2023-07-06 DIAGNOSIS — R911 Solitary pulmonary nodule: Secondary | ICD-10-CM

## 2023-07-06 DIAGNOSIS — D519 Vitamin B12 deficiency anemia, unspecified: Secondary | ICD-10-CM | POA: Insufficient documentation

## 2023-07-06 LAB — CBC WITH DIFFERENTIAL/PLATELET
Abs Immature Granulocytes: 0.03 10*3/uL (ref 0.00–0.07)
Basophils Absolute: 0 10*3/uL (ref 0.0–0.1)
Basophils Relative: 1 %
Eosinophils Absolute: 0.2 10*3/uL (ref 0.0–0.5)
Eosinophils Relative: 3 %
HCT: 30.2 % — ABNORMAL LOW (ref 36.0–46.0)
Hemoglobin: 9.5 g/dL — ABNORMAL LOW (ref 12.0–15.0)
Immature Granulocytes: 0 %
Lymphocytes Relative: 16 %
Lymphs Abs: 1.2 10*3/uL (ref 0.7–4.0)
MCH: 30 pg (ref 26.0–34.0)
MCHC: 31.5 g/dL (ref 30.0–36.0)
MCV: 95.3 fL (ref 80.0–100.0)
Monocytes Absolute: 0.8 10*3/uL (ref 0.1–1.0)
Monocytes Relative: 11 %
Neutro Abs: 5.3 10*3/uL (ref 1.7–7.7)
Neutrophils Relative %: 69 %
Platelets: 303 10*3/uL (ref 150–400)
RBC: 3.17 MIL/uL — ABNORMAL LOW (ref 3.87–5.11)
RDW: 13.5 % (ref 11.5–15.5)
WBC: 7.6 10*3/uL (ref 4.0–10.5)
nRBC: 0 % (ref 0.0–0.2)

## 2023-07-06 LAB — BASIC METABOLIC PANEL WITH GFR
Anion gap: 12 (ref 5–15)
BUN: 18 mg/dL (ref 8–23)
CO2: 19 mmol/L — ABNORMAL LOW (ref 22–32)
Calcium: 7.3 mg/dL — ABNORMAL LOW (ref 8.9–10.3)
Chloride: 105 mmol/L (ref 98–111)
Creatinine, Ser: 2.83 mg/dL — ABNORMAL HIGH (ref 0.44–1.00)
GFR, Estimated: 18 mL/min — ABNORMAL LOW (ref >60)
Glucose, Bld: 102 mg/dL — ABNORMAL HIGH (ref 70–99)
Potassium: 4.1 mmol/L (ref 3.5–5.1)
Sodium: 136 mmol/L (ref 135–145)

## 2023-07-06 MED ORDER — PIPERACILLIN-TAZOBACTAM 3.375 G IVPB
3.3750 g | Freq: Two times a day (BID) | INTRAVENOUS | Status: DC
  Administered 2023-07-06 – 2023-07-07 (×2): 3.375 g via INTRAVENOUS
  Filled 2023-07-06 (×2): qty 50

## 2023-07-06 MED ORDER — FENTANYL CITRATE (PF) 100 MCG/2ML IJ SOLN
INTRAMUSCULAR | Status: AC | PRN
  Administered 2023-07-06 (×2): 50 ug via INTRAVENOUS

## 2023-07-06 MED ORDER — CYANOCOBALAMIN 1000 MCG/ML IJ SOLN
1000.0000 ug | Freq: Every day | INTRAMUSCULAR | Status: DC
  Administered 2023-07-07: 1000 ug via INTRAMUSCULAR
  Filled 2023-07-06: qty 1

## 2023-07-06 MED ORDER — MIDAZOLAM HCL 2 MG/2ML IJ SOLN
INTRAMUSCULAR | Status: AC | PRN
  Administered 2023-07-06 (×2): 1 mg via INTRAVENOUS

## 2023-07-06 MED ORDER — SODIUM CHLORIDE 0.9 % IV SOLN: INTRAVENOUS | Status: AC

## 2023-07-06 MED ORDER — MIDAZOLAM HCL 2 MG/2ML IJ SOLN
INTRAMUSCULAR | Status: AC
Start: 2023-07-06 — End: 2023-07-06
  Filled 2023-07-06: qty 2

## 2023-07-06 MED ORDER — FENTANYL CITRATE (PF) 100 MCG/2ML IJ SOLN
INTRAMUSCULAR | Status: AC
  Filled 2023-07-06: qty 2

## 2023-07-06 MED ORDER — IOHEXOL 300 MG/ML  SOLN
50.0000 mL | Freq: Once | INTRAMUSCULAR | Status: AC | PRN
  Administered 2023-07-06: 15 mL

## 2023-07-06 MED ORDER — DICLOFENAC SODIUM 1 % EX GEL
2.0000 g | Freq: Four times a day (QID) | CUTANEOUS | Status: DC
  Administered 2023-07-06 – 2023-07-07 (×3): 2 g via TOPICAL
  Filled 2023-07-06: qty 100

## 2023-07-06 NOTE — Progress Notes (Signed)
 PROGRESS NOTE    Shelia Chan  WUX:[id number] DOB: 11-02-1955 DOA: 07/02/2023 PCP: Ulysees Gander, MD   Brief Narrative:  68 y.o. female with hx of SCC of urethra s/p chemo/radiation, cystectomy and urethrectomy, LN dissection, ileal conduit, bilateral nephroureteral stents with routine stent exchanges by IR, severe L hydronephrosis, CKD stage IV, HFpEF, atrial tachycardia, COPD / asthma, Lymphedema and recurrent UTIs presented with left flank pain with nausea and vomiting.  On presentation, she was found to have possible UTI.  CT of abdomen and pelvis showed multiple hypodensities in the liver measuring up to 2.7 cm, suspicious for metastatic disease along with severe left hydronephrosis with renal cortical thinning, unchanged, and bilateral ureteral stents with right quadrant ileal conduit and ostomy.  She was started on IV fluids and antibiotics.  MRI of liver showed possible hepatic metastases along with osseous and nodal metastases.  Oncology was consulted.   Assessment & Plan:   Principal Problem:   Complicated UTI (urinary tract infection) Active Problems:   Squamous cell carcinoma of the urethra   Liver lesion   History of ileal conduit  Complicated UTI: Possibly related to her ileal conduit and nephroureteral stents History of ileal conduit with bilateral nephroureteral stents with routine exchanges by IR and severe left hydronephrosis - Urine cultures showed multiple species.  Afebrile.  Continue Zosyn . - IR has been consulted for exchange of bilateral nephroureteral stents which is scheduled today per patient.   Multiple hypodense liver lesions, concern for metastatic disease History of SCC of urethra s/p chemo/radiation, cystectomy and urethrectomy - MRI of liver shows possible hepatic metastases along with osseous metastases, nodal metastases - Oncology complete appreciated: Oncology recommended liver biopsy.  IR consulted, patient underwent liver biopsy on  07/05/2023.   AKI on CKD stage IV - Baseline creatinine of 2.2-2.5.  Creatinine trending up and 2.83 today despite of receiving gentle hydration yesterday.  Will hydrate gently again today.  And hopefully with exchange of the nephroureteral stents, her creatinine will improve.   hyponatremia - Resolved again.   Hypokalemia - Resolved.   Acute metabolic acidosis - Bicarb down to 19 again today.  Gentle IV hydration with normal saline.  Recheck tomorrow.  If down, may need to resume bicarb drip.   Anemia of chronic disease - From chronic illnesses and renal disease.  Hemoglobin stable.  Monitor intermittently   Hypomagnesemia - Improved   Hyperlipidemia -continue atorvastatin    Mood disorder - Continue aripiprazole  and duloxetine    History of lymphedema - Lasix  on hold for now   History of atrial tachycardia -rate controlled - Continue diltiazem    COPD/asthma -Stable.  DVT prophylaxis: SCDs Start: 07/02/23 2041   Code Status: Limited: Do not attempt resuscitation (DNR) -DNR-LIMITED -Do Not Intubate/DNI   Family Communication:  None present at bedside.  Plan of care discussed with patient in length and he/she verbalized understanding and agreed with it.  Status is: Inpatient Remains inpatient appropriate because: Pending stent exchange   Estimated body mass index is 23.99 kg/m as calculated from the following:   Height as of this encounter: 5' 1 (1.549 m).   Weight as of this encounter: 57.6 kg.    Nutritional Assessment: Body mass index is 23.99 kg/m.Aaron Aas Seen by dietician.  I agree with the assessment and plan as outlined below: Nutrition Status:        . Skin Assessment: I have examined the patient's skin and I agree with the wound assessment as performed by the wound care RN  as outlined below:    Consultants:  IR and oncology  Procedures:  As above  Antimicrobials:  Anti-infectives (From admission, onward)    Start     Dose/Rate Route Frequency  Ordered Stop   07/04/23 1200  piperacillin -tazobactam (ZOSYN ) IVPB 2.25 g        2.25 g 100 mL/hr over 30 Minutes Intravenous Every 8 hours 07/04/23 0713     07/03/23 0400  piperacillin -tazobactam (ZOSYN ) IVPB 3.375 g  Status:  Discontinued        3.375 g 100 mL/hr over 30 Minutes Intravenous Every 6 hours 07/02/23 2102 07/04/23 0712   07/02/23 2100  piperacillin -tazobactam (ZOSYN ) IVPB 3.375 g        3.375 g 100 mL/hr over 30 Minutes Intravenous  Once 07/02/23 2054 07/02/23 2212         Subjective: In and examined, she complains of left flank pain.  No other complaint.  Overall feels better than yesterday.  She says that she was seen by another doctor today but she could not understand who that was.  Objective: Vitals:   07/05/23 1215 07/05/23 1220 07/05/23 2024 07/06/23 0626  BP: 108/61 (!) 101/59 130/67 118/66  Pulse: 72 68 69 66  Resp: 11 16 20 18   Temp:    98.2 F (36.8 C)  TempSrc:    Oral  SpO2: 96% 96% 96% 97%  Weight:      Height:        Intake/Output Summary (Last 24 hours) at 07/06/2023 0841 Last data filed at 07/06/2023 0500 Gross per 24 hour  Intake 2718.14 ml  Output 1900 ml  Net 818.14 ml   Filed Weights   07/02/23 2100 07/04/23 1314  Weight: 57.6 kg 57.6 kg    Examination:  General exam: Appears calm and comfortable  Respiratory system: Clear to auscultation. Respiratory effort normal. Cardiovascular system: S1 & S2 heard, RRR. No JVD, murmurs, rubs, gallops or clicks. No pedal edema. Gastrointestinal system: Abdomen is nondistended, soft and left upper quadrant tenderness and left CVA tenderness.  No organomegaly or masses felt. Normal bowel sounds heard. Central nervous system: Alert and oriented. No focal neurological deficits. Extremities: Symmetric 5 x 5 power. Skin: No rashes, lesions or ulcers.  Psychiatry: Judgement and insight appear normal. Mood & affect appropriate.    Data Reviewed: I have personally reviewed following labs and imaging  studies  CBC: Recent Labs  Lab 07/02/23 1803 07/03/23 0423 07/04/23 0500 07/05/23 0426 07/06/23 0440  WBC 10.9* 10.9* 10.0 7.8 7.6  NEUTROABS 8.3*  --  8.0* 5.5 5.3  HGB 11.6* 10.4* 10.8* 9.8* 9.5*  HCT 35.6* 32.0* 33.8* 30.0* 30.2*  MCV 95.2 95.2 97.1 93.2 95.3  PLT 330 284 279 267 303   Basic Metabolic Panel: Recent Labs  Lab 07/02/23 1627 07/03/23 0423 07/04/23 0500 07/05/23 0426 07/06/23 0440  NA 140 134* 135 133* 136  K 3.3* 3.8 3.6 3.1* 4.1  CL 107 108 106 100 105  CO2 19* 18* 16* 21* 19*  GLUCOSE 92 106* 116* 120* 102*  BUN 25* 25* 26* 23 18  CREATININE 2.29* 2.36* 2.65* 2.73* 2.83*  CALCIUM  9.1 7.7* 7.7* 7.1* 7.3*  MG  --  1.4* 2.1 1.8  --   PHOS  --  2.3*  --   --   --    GFR: Estimated Creatinine Clearance: 15.3 mL/min (A) (by C-G formula based on SCr of 2.83 mg/dL (H)). Liver Function Tests: Recent Labs  Lab 07/02/23 1627  AST 19  ALT 17  ALKPHOS 79  BILITOT 0.7  PROT 7.6  ALBUMIN 3.8   No results for input(s): LIPASE, AMYLASE in the last 168 hours. No results for input(s): AMMONIA in the last 168 hours. Coagulation Profile: Recent Labs  Lab 07/04/23 1048  INR 1.1   Cardiac Enzymes: No results for input(s): CKTOTAL, CKMB, CKMBINDEX, TROPONINI in the last 168 hours. BNP (last 3 results) No results for input(s): PROBNP in the last 8760 hours. HbA1C: No results for input(s): HGBA1C in the last 72 hours. CBG: No results for input(s): GLUCAP in the last 168 hours. Lipid Profile: No results for input(s): CHOL, HDL, LDLCALC, TRIG, CHOLHDL, LDLDIRECT in the last 72 hours. Thyroid Function Tests: No results for input(s): TSH, T4TOTAL, FREET4, T3FREE, THYROIDAB in the last 72 hours. Anemia Panel: Recent Labs    07/04/23 1819  VITAMINB12 116*  FOLATE 9.8   Sepsis Labs: No results for input(s): PROCALCITON, LATICACIDVEN in the last 168 hours.  Recent Results (from the past 240 hours)  Urine  Culture     Status: Abnormal   Collection Time: 07/02/23  2:58 PM   Specimen: Urine, Clean Catch  Result Value Ref Range Status   Specimen Description   Final    URINE, CLEAN CATCH Performed at [facility], [address], [place], [place] [address]    Special Requests   Final    NONE Performed at [facility], [address], [place], [place] [address]    Culture MULTIPLE SPECIES PRESENT, SUGGEST RECOLLECTION (A)  Final   Report Status 07/03/2023 FINAL  Final     Radiology Studies: US  BIOPSY (LIVER) Result Date: 07/05/2023 CLINICAL DATA:  History of urothelial carcinoma. Multiple hepatic lesions on recent MR. Biopsy requested EXAM: ULTRASOUND-GUIDED CORE LIVER LESION BIOPSY TECHNIQUE: An ultrasound guided liver biopsy was thoroughly discussed with the patient and questions were answered. The benefits, risks, alternatives, and complications were also discussed. The patient understands and wishes to proceed with the procedure. A verbal as well as written consent was obtained. Survey ultrasound of the liver was performed, representative lesion localized and an appropriate skin entry site was determined. Skin site was marked, prepped with chlorhexidine , and draped in usual sterile fashion, and infiltrated locally with 1% lidocaine . Intravenous Fentanyl  100mcg and Versed  2mg  were administered by RN during a total moderate (conscious) sedation time of 12 minutes; the patient's level of consciousness and physiological / cardiorespiratory status were monitored continuously by radiology RN under my direct supervision. A 17 gauge trocar needle was advanced under ultrasound guidance into the liver to the margin of the lesion. Multiple coaxial 18gauge core samples were then obtained through the guide needle. The guide needle was removed. Post procedure scans demonstrate no apparent complication. COMPLICATIONS: COMPLICATIONS None immediate FINDINGS: Multiple liver lesions  were localized corresponding to MRI findings. A representative right lesion was approached for core biopsy sampling as above. IMPRESSION: 1. Technically successful ultrasound guided core liver lesion biopsy. Electronically Signed   By: Nicoletta Barrier M.D.   On: 07/05/2023 13:36   CT CHEST WO CONTRAST Result Date: 07/05/2023 CLINICAL DATA:  Urethral squamous cell carcinoma. Multifocal hepatic lesions suspicious for metastatic disease. Suspected osseous metastatic disease. * Tracking Code: BO * EXAM: CT CHEST WITHOUT CONTRAST TECHNIQUE: Multidetector CT imaging of the chest was performed following the standard protocol without IV contrast. RADIATION DOSE REDUCTION: This exam was performed according to the departmental dose-optimization program which includes automated exposure control, adjustment of the mA and/or kV according  to patient size and/or use of iterative reconstruction technique. COMPARISON:  Multiple exams, including CT abdomen 07/02/2023 and chest radiograph 04/21/2021 FINDINGS: Cardiovascular: Atheromatous vascular calcification of the thoracic aorta. Mediastinum/Nodes: Scattered enlarged pre-vascular, AP window, and paratracheal adenopathy. Index right lower paratracheal node 1.8 cm in short axis, image 63 series 2. Lungs/Pleura: Emphysema. 1.5 by 1.6 by 1.9 cm peribronchovascular nodule medially in the left upper lobe with associated occlusion of apicoposterior bronchial segments as on image 49 series 5. Cystic lung lesion with mildly accentuated marginal thickening on image 81 series 5, total lesion measures 1.7 by 1.0 cm with the margin about 1 mm in thickness. Low-grade adenocarcinoma not excluded. A primarily calcified 0.7 by 0.5 cm left upper lobe nodule is shown on image 89 series 5. Other calcified nodularity and scarring in the lingula noted. Upper Abdomen: Known liver lesions.  Left hydronephrosis. Musculoskeletal: Old healed left rib fractures. Old compression fractures at T6 and T8.  IMPRESSION: 1. Peribronchovascular nodule medially in the left upper lobe with associated occlusion of apicoposterior bronchial segments. This is suspicious for a primary bronchogenic carcinoma. 2. Cystic lung lesion with mildly accentuated marginal thickening in the right upper lobe. Low-grade adenocarcinoma not excluded. 3. Scattered enlarged mediastinal adenopathy, suspicious for metastatic disease. 4. Known liver lesions. 5. Left hydronephrosis. 6. Old healed left rib fractures. Old compression fractures at T6 and T8. 7. Aortic Atherosclerosis (ICD10-I70.0) and Emphysema (ICD10-J43.9). Electronically Signed   By: Freida Jes M.D.   On: 07/05/2023 12:18    Scheduled Meds:  ARIPiprazole   30 mg Oral Daily   atorvastatin   40 mg Oral Daily   calcitRIOL   0.25 mcg Oral Daily   diltiazem   120 mg Oral Daily   DULoxetine   30 mg Oral Daily   Continuous Infusions:  piperacillin -tazobactam (ZOSYN )  IV 2.25 g (07/06/23 0305)   sodium bicarbonate  150 mEq in dextrose  5 % 1,150 mL infusion 75 mL/hr at 07/06/23 0306     LOS: 3 days   Modena Andes, MD [facility]  07/06/2023, 8:41 AM   *Please note that this is a verbal dictation therefore any spelling or grammatical errors are due to the Dragon Medical One system interpretation.  Please page via Amion and do not message via secure chat for urgent patient care matters. Secure chat can be used for non urgent patient care matters.  How to contact the [facility] Attending or Consulting provider 7A - 7P or covering provider during after hours 7P -7A, for this patient?  Check the care team in [facility] and look for a) attending/consulting TRH provider listed and b) the TR[facility] team listed. Page or secure chat 7A-7P. Log into [email or web address] and use [place]'s universal password to access. If you do not have the password, please contact the hospital operator. Locate the [facility] provider you are looking for under [facility] and page to a number that you  can be directly reached. If you still have difficulty reaching the provider, please page the [facility] (Director on Call) for the Hospitalists listed on amion for assistance.

## 2023-07-06 NOTE — TOC Transition Note (Signed)
 Transition of Care [facility]) - Discharge Note   Patient Details  Name: Shelia Chan MRN: [id number] Date of Birth: 1955-08-01  Transition [facility]) CM/SW Contact:  Kathryn Parish, RN Phone Number: 07/06/2023, 3:53 PM   Clinical Narrative:    Patient discharge to home. Son in-law will transport. TOC signing off.   Final next level of care: Home/Self Care Barriers to Discharge: Barriers Resolved   Patient Goals and CMS Choice Patient states their goals for this hospitalization and ongoing recovery are:: Home CMS Medicare.gov Compare Post Acute Care list provided to::  (NA) Choice offered to / list presented to : NA [place] ownership interest in [facility].provided to:: Parent NA    Discharge Placement                       Discharge Plan and Services Additional resources added to the After Visit Summary for     Discharge Planning Services: NA Post Acute Care Choice: NA          DME Arranged: N/A DME Agency: NA       HH Arranged: NA HH Agency: NA        Social Drivers of Health (SDOH) Interventions SDOH Screenings   Food Insecurity: No Food Insecurity (07/02/2023)  Housing: Low Risk  (07/02/2023)  Transportation Needs: No Transportation Needs (07/02/2023)  Utilities: Not At Risk (07/02/2023)  Depression (PHQ2-9): Low Risk  (01/27/2022)  Social Connections: Moderately Integrated (07/02/2023)  Tobacco Use: High Risk (07/02/2023)     Readmission Risk Interventions     No data to display

## 2023-07-06 NOTE — Assessment & Plan Note (Signed)
 Agree with empiric IV antibiotics

## 2023-07-06 NOTE — Progress Notes (Signed)
 Shelia Chan   DOB:1955-03-29   ZO#:[id number]    ASSESSMENT & PLAN:   Shelia Chan 68 y.o. female with medical history significant for SCC urethra, CKD stage IV, 50 pack smoking history, COPD, asthma who was admitted on 07/02/2023 with complaints of abdominal pain found to have diffuse liver lesions.   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  She has been in remission since.   Clinically, this may or may not be related to prior malignancy.  She has 50 pack smoking history.  Clinically reported more short of breath over the last 6 months. CT chest showed peribronchovascular nodule medially in the left upper lobe with associated occlusion of apicoposterior bronchial segments. Query about  suspicious for a primary bronchogenic carcinoma. Discussed with patient.  Follow up final pathology. She reports good PS before admission and cancer directed therapy is indicated once cancer diagnosis is proven. Pending results, may either see me for GU origin of recurrence vs our lung cancer oncologist.  Assessment & Plan Liver lesion New findings. Pending results from tissue biopsy for definitive diagnosis Recommend NGS testing If primary lung malignancy, obtain MRI of brain and follow up with Dr. Marguerita Shih. If GU primary, I will follow up next week. Complicated UTI (urinary tract infection) Agree with empiric IV antibiotics Squamous cell carcinoma of the urethra History of definitive treatment completed in 2016. Will need biopsy for definitive diagnosis given the remote history History of ileal conduit Borderline macrocytic anemia and b12 deficiency. B12 daily x 7 then weekly x 4 then monthly. B12 deficiency anemia B12 daily x 7 then weekly x 4 then monthly. H/o ileal conduit   Discharge  planning If primary lung malignancy, obtain MRI of brain and follow up with Dr. Marguerita Shih. If GU primary, I will follow up next week.  All questions were answered.    Thank you for the consult.   Lowanda Ruddy, MD 07/06/2023 12:56 PM  Subjective:  Shelia Chan reports feeling pain over iv infiltrate site yesterday. Back on the left lower back.  No worsening cough, short of breath, stomach pain. Urine clear. No headaches.  Objective:  Vitals:   07/05/23 2024 07/06/23 0626  BP: 130/67 118/66  Pulse: 69 66  Resp: 20 18  Temp:  98.2 F (36.8 C)  SpO2: 96% 97%     Intake/Output Summary (Last 24 hours) at 07/06/2023 1256 Last data filed at 07/06/2023 9811 Gross per 24 hour  Intake 2718.14 ml  Output 2400 ml  Net 318.14 ml    GENERAL: alert, no distress and comfortable SKIN: skin color normal EYES: normal, sclera clear NECK: supple, no palpable mass LYMPH:  no palpable cervical lymphadenopathy LUNGS: No wheeze, rales and clear to auscultation bilaterally with normal breathing effort.  HEART: regular rate & rhythm  ABDOMEN: abdomen soft, non-tender and non-distended.  Urostomy in place. Clear urine Musculoskeletal: no lower extremity edema NEURO: alert with fluent speech, no focal motor/sensory deficits   Labs:  Recent Labs    12/29/22 1430 12/29/22 1518 12/30/22 0556 12/31/22 0746 07/02/23 1627 07/03/23 0423 07/04/23 0500 07/05/23 0426 07/06/23 0440  NA 139   < > 135   < > 140   < > 135 133* 136  K 3.4*   < > 3.5   < > 3.3*   < > 3.6 3.1* 4.1  CL 111   < > 106   < > 107   < > 106 100 105  CO2 19*  --  19*   < > 19*   < > 16* 21* 19*  GLUCOSE 110*   < > 99   < > 92   < > 116* 120* 102*  BUN 30*   < > 33*   < > 25*   < > 26* 23 18  CREATININE 1.97*   < > 2.11*   < > 2.29*   < > 2.65* 2.73* 2.83*  CALCIUM  7.4*  --  7.4*   < > 9.1   < > 7.7* 7.1* 7.3*  GFRNONAA 27*  --  25*   < > 23*   < > 19* 18* 18*  PROT 6.0*  --  5.8*  --  7.6  --   --   --   --   ALBUMIN 3.0*  --   2.9*  --  3.8  --   --   --   --   AST 12*  --  11*  --  19  --   --   --   --   ALT 9  --  8  --  17  --   --   --   --   ALKPHOS 63  --  63  --  79  --   --   --   --   BILITOT 0.6  --  0.5  --  0.7  --   --   --   --    < > = values in this interval not displayed.    Studies:  US  BIOPSY (LIVER) Result Date: 07/05/2023 CLINICAL DATA:  History of urothelial carcinoma. Multiple hepatic lesions on recent MR. Biopsy requested EXAM: ULTRASOUND-GUIDED CORE LIVER LESION BIOPSY TECHNIQUE: An ultrasound guided liver biopsy was thoroughly discussed with the patient and questions were answered. The benefits, risks, alternatives, and complications were also discussed. The patient understands and wishes to proceed with the procedure. A verbal as well as written consent was obtained. Survey ultrasound of the liver was performed, representative lesion localized and an appropriate skin entry site was determined. Skin site was marked, prepped with chlorhexidine , and draped in usual sterile fashion, and infiltrated locally with 1% lidocaine . Intravenous Fentanyl  100mcg and Versed  2mg  were administered by RN during a total moderate (conscious) sedation time of 12 minutes; the patient's level of consciousness and physiological / cardiorespiratory status were monitored continuously by radiology RN under my direct supervision. A 17 gauge trocar needle was advanced under ultrasound guidance into the liver to the margin of the lesion. Multiple coaxial 18gauge core samples were then obtained through the guide needle. The guide needle was removed. Post procedure scans demonstrate no apparent complication. COMPLICATIONS: COMPLICATIONS None immediate FINDINGS: Multiple liver lesions were localized corresponding to MRI findings. A representative right lesion was approached for core biopsy sampling as above. IMPRESSION: 1. Technically successful ultrasound guided core liver lesion biopsy. Electronically Signed   By: Nicoletta Barrier M.D.    On: 07/05/2023 13:36   CT CHEST WO CONTRAST Result Date: 07/05/2023 CLINICAL DATA:  Urethral squamous cell carcinoma. Multifocal hepatic lesions suspicious for metastatic disease. Suspected osseous metastatic disease. * Tracking Code: BO * EXAM: CT CHEST WITHOUT CONTRAST TECHNIQUE: Multidetector CT imaging of the chest was performed following the standard protocol without IV contrast. RADIATION DOSE REDUCTION: This exam was performed according to the departmental dose-optimization program which  includes automated exposure control, adjustment of the mA and/or kV according to patient size and/or use of iterative reconstruction technique. COMPARISON:  Multiple exams, including CT abdomen 07/02/2023 and chest radiograph 04/21/2021 FINDINGS: Cardiovascular: Atheromatous vascular calcification of the thoracic aorta. Mediastinum/Nodes: Scattered enlarged pre-vascular, AP window, and paratracheal adenopathy. Index right lower paratracheal node 1.8 cm in short axis, image 63 series 2. Lungs/Pleura: Emphysema. 1.5 by 1.6 by 1.9 cm peribronchovascular nodule medially in the left upper lobe with associated occlusion of apicoposterior bronchial segments as on image 49 series 5. Cystic lung lesion with mildly accentuated marginal thickening on image 81 series 5, total lesion measures 1.7 by 1.0 cm with the margin about 1 mm in thickness. Low-grade adenocarcinoma not excluded. A primarily calcified 0.7 by 0.5 cm left upper lobe nodule is shown on image 89 series 5. Other calcified nodularity and scarring in the lingula noted. Upper Abdomen: Known liver lesions.  Left hydronephrosis. Musculoskeletal: Old healed left rib fractures. Old compression fractures at T6 and T8. IMPRESSION: 1. Peribronchovascular nodule medially in the left upper lobe with associated occlusion of apicoposterior bronchial segments. This is suspicious for a primary bronchogenic carcinoma. 2. Cystic lung lesion with mildly accentuated marginal thickening in  the right upper lobe. Low-grade adenocarcinoma not excluded. 3. Scattered enlarged mediastinal adenopathy, suspicious for metastatic disease. 4. Known liver lesions. 5. Left hydronephrosis. 6. Old healed left rib fractures. Old compression fractures at T6 and T8. 7. Aortic Atherosclerosis (ICD10-I70.0) and Emphysema (ICD10-J43.9). Electronically Signed   By: Freida Jes M.D.   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 CONTRAST:  5mL GADAVIST  GADOBUTROL  1 MMOL/ML IV SOLN COMPARISON:  CT abdomen and pelvis dated 07/02/2023, 12/29/2022 FINDINGS: Lower chest: No acute findings. Hepatobiliary: Innumerable peripherally enhancing foci throughout the liver, for example 2.4 x 2.1 cm segment 3 (13:48) and 2.9 x 2.5 cm segment 6 (13:55). No bile duct dilation. Distended gallbladder. Pancreas: No mass, inflammatory changes, or other parenchymal abnormality identified. Spleen:  Within normal limits in size and appearance. Adrenals/Urinary Tract: No adrenal nodules. Similar severe left hydroureteronephrosis to the level the proximal left ureter. Bilateral ureteral stents in-situ. Interpolar right renal simple and hemorrhagic/proteinaceous cysts. Layering fluid level within the left renal collecting system. Stomach/Bowel: Visualized portions within the abdomen are unremarkable. Vascular/Lymphatic: Slight interval increase in size of an 8 mm left para-aortic lymph node (15:39), previously 6 mm. No abdominal aortic aneurysm demonstrated. Other:  Right lower quadrant ileal conduit with stents in-situ. Musculoskeletal: Multifocal enhancing, diffusion restricting  foci within the spine and partially imaged pelvis, suspicious for osseous metastases, for example 1.7 x 1.6 cm anterior segment L4 (15:56) and right transverse process of L2 (15:29). Partially imaged postsurgical changes of the left proximal femur. IMPRESSION: 1. Innumerable peripherally enhancing foci throughout the liver, suspicious for hepatic metastases. 2. Multifocal enhancing, diffusion restricting foci within the spine and partially imaged pelvis, suspicious for osseous metastases, new compared to 12/29/2022. 3. Slight interval increase in size of an 8 mm left para-aortic lymph node, suspicious for nodal metastasis. 4. Similar severe left hydroureteronephrosis to the level the proximal left ureter. Bilateral ureteral stents in-situ. Layering fluid level within the left renal collecting system, which may reflect  blood products or debris. Electronically Signed   By: Limin  Xu M.D.   On: 07/03/2023 08:56   CT ABDOMEN PELVIS WO CONTRAST Result Date: 07/02/2023 CLINICAL DATA:  Abdominal pain, acute, nonlocalized. Left-sided flank pain radiating toward left side of abdomen. Urostomy bag with decreased output. EXAM: CT ABDOMEN AND PELVIS WITHOUT CONTRAST TECHNIQUE: Multidetector CT imaging of the abdomen and pelvis was performed following the standard protocol without IV contrast. RADIATION DOSE REDUCTION: This exam was performed according to the departmental dose-optimization program which includes automated exposure control, adjustment of the mA and/or kV according to patient size and/or use of iterative reconstruction technique. COMPARISON:  12/29/2022. FINDINGS: Lower chest: No acute abnormality. Hepatobiliary: Scattered ill-defined hypodensities are present in the liver measuring up to 2.7 cm, suspicious for metastatic disease. No biliary ductal dilatation is seen. The gallbladder is without stones. Pancreas: Unremarkable. No pancreatic ductal dilatation or surrounding inflammatory changes. Spleen: Normal in  size without focal abnormality. Adrenals/Urinary Tract: The adrenal glands are within normal limits. There is severe hydronephrosis on the left with renal cortical thinning. Ureteral stents are noted bilaterally in terminating in a right lower quadrant ileal conduit and ostomy. No renal or ureteral calculus is seen. Peri ureteral fat stranding is present on the left, similar in appearance to the prior exam. The bladder is surgically absent. Stomach/Bowel: The stomach is within normal limits. The appendix is not seen. There are mildly distended loops of bowel in the right lower quadrant measuring up to 3.5 cm adjacent to the ileal conduit. No definite transition 0.18, pneumatosis, or free air is seen. Vascular/Lymphatic: Aortic atherosclerosis. No abdominal or pelvic lymphadenopathy is seen. Reproductive: Status post hysterectomy. No adnexal masses. Other: There is no free fluid in the pelvis. Musculoskeletal: Fixation hardware is noted in the proximal left femur. Degenerative changes are noted in the thoracolumbar spine. Sacral iliac attic an pubic symphysis sclerosis and fragmentation are unchanged. IMPRESSION: 1. Severe left hydronephrosis with renal cortical thinning unchanged. Bilateral ureteral stents are noted with right lower quadrant ileal conduit and ostomy. 2. Mildly distended loops of bowel in the right lower quadrant adjacent to the ostomy measuring up to 3.5 cm, possibly representing small bowel versus ileal conduit. Repeat evaluation with oral contrast may be beneficial for further evaluation. 3. Multiple hypodensities in the liver measuring up to 2.7 cm, suspicious for metastatic disease. Multiphase CT or MRI is recommended for further evaluation. 4. Aortic atherosclerosis. Electronically Signed   By: Wyvonnia Heimlich M.D.   On: 07/02/2023 16:34

## 2023-07-06 NOTE — Assessment & Plan Note (Signed)
 B12 daily x 7 then weekly x 4 then monthly. H/o ileal conduit

## 2023-07-06 NOTE — Assessment & Plan Note (Signed)
 Borderline macrocytic anemia and b12 deficiency. B12 daily x 7 then weekly x 4 then monthly.

## 2023-07-06 NOTE — Plan of Care (Signed)

## 2023-07-06 NOTE — Assessment & Plan Note (Signed)
 New findings. Pending results from tissue biopsy for definitive diagnosis Recommend NGS testing If primary lung malignancy, obtain MRI of brain and follow up with Dr. Marguerita Shih. If GU primary, I will follow up next week.

## 2023-07-06 NOTE — Assessment & Plan Note (Signed)
 History of definitive treatment completed in 2016. Will need biopsy for definitive diagnosis given the remote history

## 2023-07-06 NOTE — Procedures (Addendum)
 Pre procedural Dx: Routine exchange bilateral nephrostomy tube exchange Post procedural Dx: Same  Technically successful exchange of 12Fr nephroureteral catheters.    Return in 6 weeks for routine exchange.    EBL: Trace Complications: None immediate  Zettie Hillock, MD Pager #: [phone]

## 2023-07-07 DIAGNOSIS — N390 Urinary tract infection, site not specified: Secondary | ICD-10-CM | POA: Diagnosis not present

## 2023-07-07 LAB — BASIC METABOLIC PANEL WITH GFR
Anion gap: 11 (ref 5–15)
BUN: 14 mg/dL (ref 8–23)
CO2: 21 mmol/L — ABNORMAL LOW (ref 22–32)
Calcium: 7.2 mg/dL — ABNORMAL LOW (ref 8.9–10.3)
Chloride: 107 mmol/L (ref 98–111)
Creatinine, Ser: 2.68 mg/dL — ABNORMAL HIGH (ref 0.44–1.00)
GFR, Estimated: 19 mL/min — ABNORMAL LOW (ref >60)
Glucose, Bld: 93 mg/dL (ref 70–99)
Potassium: 4 mmol/L (ref 3.5–5.1)
Sodium: 139 mmol/L (ref 135–145)

## 2023-07-07 MED ORDER — FUROSEMIDE 40 MG PO TABS: 40.0000 mg | ORAL_TABLET | Freq: Every day | ORAL | Status: DC

## 2023-07-07 NOTE — Progress Notes (Signed)
 Patient discharged to home, reviewed AVS and answered all questions. Patient to schedule f/u appointment with PCP. NO follow up appointment for oncology on AVS - confirmed with Alita Irwin, MD they will follow-up with patient. IV removed, site clean dry and intact. Patient arranged transportation home.

## 2023-07-07 NOTE — Discharge Summary (Signed)
 Physician Discharge Summary  Kareem Aul ZOX:[id number] DOB: Nov 06, 1955 DOA: 07/02/2023  PCP: Ulysees Gander, MD  Admit date: 07/02/2023 Discharge date: 07/07/2023 30 Day Unplanned Readmission Risk Score    Flowsheet Row ED to Hosp-Admission (Current) from 07/02/2023 in [facility]  30 Day Unplanned Readmission Risk Score (%) 26.6 Filed at 07/07/2023 0801    This score is the patient's risk of an unplanned readmission within 30 days of being discharged (0 -100%). The score is based on dignosis, age, lab data, medications, orders, and past utilization.   Low:  0-14.9   Medium: 15-21.9   High: 22-29.9   Extreme: 30 and above          Admitted From: Home Disposition: Home  Recommendations for Outpatient Follow-up:  Follow up with PCP in 1-2 weeks Please obtain BMP/CBC in one week Follow-up with IR for exchange of the ureteral stents and 6 weeks Follow-up with oncology-they will follow-up with your pathology report. Please follow up with your PCP on the following pending results: Unresulted Labs (From admission, onward)     Start     Ordered   07/04/23 1759  Methylmalonic acid, serum  Add-on,   AD        07/04/23 1758              Home Health: None Equipment/Devices: None  Discharge Condition: Stable CODE STATUS: DNR Diet recommendation: Cardiac  Subjective: Seen and examined.  Her left flank pain is improved.  She has no other complaint.  Brief/Interim Summary: 68 y.o. female with hx of SCC of urethra s/p chemo/radiation, cystectomy and urethrectomy, LN dissection, ileal conduit, bilateral nephroureteral stents with routine stent exchanges by IR, severe L hydronephrosis, CKD stage IV, HFpEF, atrial tachycardia, COPD / asthma, Lymphedema and recurrent UTIs presented with left flank pain with nausea and vomiting.  On presentation, she was found to have possible UTI.  CT of abdomen and pelvis showed multiple hypodensities  in the liver measuring up to 2.7 cm, suspicious for metastatic disease along with severe left hydronephrosis with renal cortical thinning, unchanged, and bilateral ureteral stents with right quadrant ileal conduit and ostomy.  She was started on IV fluids and antibiotics.  MRI of liver showed possible hepatic metastases along with osseous and nodal metastases.  Oncology was consulted.  Details below.   Complicated UTI: Possibly related to her ileal conduit and nephroureteral stents History of ileal conduit with bilateral nephroureteral stents with routine exchanges by IR and severe left hydronephrosis - Urine cultures showed multiple species.  Afebrile.  Patient received 5 days of Zosyn .  Her leukocytosis has resolved. - IR has been consulted for exchange of bilateral nephroureteral stents which was completed yesterday on 07/06/2023.   Multiple hypodense liver lesions, concern for metastatic disease History of SCC of urethra s/p chemo/radiation, cystectomy and urethrectomy - MRI of liver shows possible hepatic metastases along with osseous metastases, nodal metastases - Oncology auscultation appreciated: Oncology recommended liver biopsy.  IR consulted, patient underwent liver biopsy on 07/05/2023.  Pathology report is still pending.  Lengthy discussion with Dr. Alita Irwin of oncology, he has cleared the patient for discharge and they will follow-up with pathology report as outpatient and they will refer her to appropriate oncologist based on the primary source of cancer found on the biopsy results.   AKI on CKD stage IV - Baseline creatinine of 2.2-2.5.  Creatinine improved and down to 2.68 today which is very close to her baseline.  She was advised to drink plenty of fluids.  I am hoping that this will further improve now that she had exchange of the ureteral stents and with that, her creatinine has already improved somewhat compared to yesterday.   hyponatremia - Resolved again.   Hypokalemia -  Resolved.   Acute metabolic acidosis - Improving and bicarb is 21 today.   Anemia of chronic disease - From chronic illnesses and renal disease.  Hemoglobin stable.  Monitor intermittently   Hypomagnesemia - Improved   Hyperlipidemia -continue atorvastatin    Mood disorder - Continue aripiprazole  and duloxetine    History of lymphedema - Lasix  was on hold due to AKI, advised to resume on 07/09/2023 to avoid any nephrotoxic agents for next 2 days.   History of atrial tachycardia Not on any rate control medications PTA but she was started on Cardizem  here.  No further episodes of atrial tachycardia.  EKG also did not show any atrial tachycardia.  Based on this, I do not think she needs to add this new medication to her regimen.  Discontinuing this.   COPD/asthma -Stable.  Discharge plan was discussed with patient and/or family member and they verbalized understanding and agreed with it.  Discharge Diagnoses:  Principal Problem:   Complicated UTI (urinary tract infection) Active Problems:   Squamous cell carcinoma of the urethra   Liver lesion   History of ileal conduit   B12 deficiency anemia   Liver masses   Lesion of left lung    Discharge Instructions   Allergies as of 07/07/2023       Reactions   Metoprolol  Shortness Of Breath   Shellfish-derived Products Other (See Comments)   MD said to NOT eat this due to the urostomy   Tape Other (See Comments)   CLEAR, PLASTIC, HOSPITAL TAPE PULLS OFF THE SKIN!!!        Medication List     TAKE these medications    acetaminophen  500 MG tablet Commonly known as: TYLENOL  Take 500-1,000 mg by mouth every 6 (six) hours as needed for fever or mild pain (pain score 1-3) (or headaches).   albuterol  108 (90 Base) MCG/ACT inhaler Commonly known as: VENTOLIN  HFA Inhale 1 puff into the lungs every 6 (six) hours as needed for shortness of breath.   ARIPiprazole  30 MG tablet Commonly known as: ABILIFY  Take 30 mg by mouth  daily.   aspirin  EC 81 MG tablet Take 81 mg by mouth daily.   atorvastatin  40 MG tablet Commonly known as: LIPITOR Take 1 tablet daily at bedtime. What changed: when to take this   calcitRIOL  0.25 MCG capsule Commonly known as: ROCALTROL  Take 0.25 mcg by mouth daily.   furosemide  40 MG tablet Commonly known as: LASIX  Take 40 mg by mouth daily.   oxyCODONE  5 MG immediate release tablet Commonly known as: Oxy IR/ROXICODONE  Take 1 tablet (5 mg total) by mouth every 6 (six) hours as needed for severe pain   oxyCODONE  5 MG immediate release tablet Commonly known as: Oxy IR/ROXICODONE  take 1 tablet (5 mg) by oral route every 6 hours as needed for severe pain   oxyCODONE  5 MG immediate release tablet Commonly known as: Oxy IR/ROXICODONE  Take 1 tablet (5 mg total) by mouth every 6 (six) hours as needed for severe pain   oxyCODONE  5 MG immediate release tablet Commonly known as: Oxy IR/ROXICODONE  Take 1 tablet (5 mg total) by mouth every 6 (six) hours as needed for severe pain   potassium chloride  20 MEQ packet  Commonly known as: KLOR-CON  Take 20 mEq by mouth daily as needed (for cramping in the body).   Prolia  60 MG/ML Sosy injection Generic drug: denosumab  Inject 60 mg [place] every 6 months        Follow-up Information     Adeline Hone A, MD Follow up in 1 week(s).   Specialty: Family Medicine Contact information: [address] [place] [address] [phone]                Allergies  Allergen Reactions   Metoprolol  Shortness Of Breath   Shellfish-Derived Products Other (See Comments)    MD said to NOT eat this due to the urostomy   Tape Other (See Comments)    CLEAR, PLASTIC, HOSPITAL TAPE PULLS OFF THE SKIN!!!    Consultations: Oncology and IR   Procedures/Studies: IR EXT NEPHROURETERAL CATH EXCHANGE Result Date: 07/06/2023 INDICATION: Routine bilateral nephroureteral catheters via ileal conduit exchange. EXAM: Exchange of bilateral  nephroureteral catheters with fluoroscopy COMPARISON:  IR nephroureteral catheter exchange Jun 01, 2023 MEDICATIONS: None; The antibiotic was administered in an appropriate time frame prior to skin puncture. ANESTHESIA/SEDATION: Moderate (conscious) sedation was employed during this procedure. A total of Versed  2 mg and Fentanyl  100 mcg was administered intravenously by the radiology nurse. Total intra-service moderate Sedation Time: 10 minutes. The patient's level of consciousness and vital signs were monitored continuously by radiology nursing throughout the procedure under my direct supervision. CONTRAST:  15 mL Omnipaque  300-administered into the collecting system(s) FLUOROSCOPY: Radiation Exposure Index (as provided by the fluoroscopic device): 5 mGy Kerma COMPLICATIONS: None immediate. PROCEDURE: Informed written consent was obtained from the patient after a thorough discussion of the procedural risks, benefits and alternatives. All questions were addressed. Maximal Sterile Barrier Technique was utilized including caps, mask, sterile gowns, sterile gloves, sterile drape, hand hygiene and skin antiseptic. A timeout was performed prior to the initiation of the procedure. In a supine position, the right lower quadrant was prepped and draped in the usual sterile fashion. Fluoroscopy demonstrated the catheters to be in similar position as demonstrated during the last exchange. Dilute contrast was injected into both nephroureteral catheters filling the renal pelvis and calices demonstrating moderate to marked hydronephrosis. The catheters were cut. In sequential fashion a guidewire was advanced into the right nephrostomy tube under fluoroscopic guidance. The catheter tip was uncoiled and then the catheter was completely removed from the patient. A new 12 French nephroureteral catheter was then advanced over the guidewire into the ostomy and placed in the renal pelvis. Locking suture was engaged. Guidewire was  removed. Guidewire was then advanced into the left nephroureteral catheter and the catheter was removed under fluoroscopic guidance leaving the wire coiled within the renal pelvis. A new 12 French nephroureteral catheter was advanced over the guidewire and coiled within the renal pelvis. Guidewire was removed and the locking suture was engaged. IMPRESSION: Successful exchange of bilateral nephroureteral catheters as described above. The patient will return in approximately 6 weeks for subsequent exchange. Electronically Signed   By: Susan Ensign   On: 07/06/2023 14:16   US  BIOPSY (LIVER) Result Date: 07/05/2023 CLINICAL DATA:  History of urothelial carcinoma. Multiple hepatic lesions on recent MR. Biopsy requested EXAM: ULTRASOUND-GUIDED CORE LIVER LESION BIOPSY TECHNIQUE: An ultrasound guided liver biopsy was thoroughly discussed with the patient and questions were answered. The benefits, risks, alternatives, and complications were also discussed. The patient understands and wishes to proceed with the procedure. A verbal as well as written consent was obtained.  Survey ultrasound of the liver was performed, representative lesion localized and an appropriate skin entry site was determined. Skin site was marked, prepped with chlorhexidine , and draped in usual sterile fashion, and infiltrated locally with 1% lidocaine . Intravenous Fentanyl  100mcg and Versed  2mg  were administered by RN during a total moderate (conscious) sedation time of 12 minutes; the patient's level of consciousness and physiological / cardiorespiratory status were monitored continuously by radiology RN under my direct supervision. A 17 gauge trocar needle was advanced under ultrasound guidance into the liver to the margin of the lesion. Multiple coaxial 18gauge core samples were then obtained through the guide needle. The guide needle was removed. Post procedure scans demonstrate no apparent complication. COMPLICATIONS: COMPLICATIONS None  immediate FINDINGS: Multiple liver lesions were localized corresponding to MRI findings. A representative right lesion was approached for core biopsy sampling as above. IMPRESSION: 1. Technically successful ultrasound guided core liver lesion biopsy. Electronically Signed   By: Nicoletta Barrier M.D.   On: 07/05/2023 13:36   CT CHEST WO CONTRAST Result Date: 07/05/2023 CLINICAL DATA:  Urethral squamous cell carcinoma. Multifocal hepatic lesions suspicious for metastatic disease. Suspected osseous metastatic disease. * Tracking Code: BO * EXAM: CT CHEST WITHOUT CONTRAST TECHNIQUE: Multidetector CT imaging of the chest was performed following the standard protocol without IV contrast. RADIATION DOSE REDUCTION: This exam was performed according to the departmental dose-optimization program which includes automated exposure control, adjustment of the mA and/or kV according to patient size and/or use of iterative reconstruction technique. COMPARISON:  Multiple exams, including CT abdomen 07/02/2023 and chest radiograph 04/21/2021 FINDINGS: Cardiovascular: Atheromatous vascular calcification of the thoracic aorta. Mediastinum/Nodes: Scattered enlarged pre-vascular, AP window, and paratracheal adenopathy. Index right lower paratracheal node 1.8 cm in short axis, image 63 series 2. Lungs/Pleura: Emphysema. 1.5 by 1.6 by 1.9 cm peribronchovascular nodule medially in the left upper lobe with associated occlusion of apicoposterior bronchial segments as on image 49 series 5. Cystic lung lesion with mildly accentuated marginal thickening on image 81 series 5, total lesion measures 1.7 by 1.0 cm with the margin about 1 mm in thickness. Low-grade adenocarcinoma not excluded. A primarily calcified 0.7 by 0.5 cm left upper lobe nodule is shown on image 89 series 5. Other calcified nodularity and scarring in the lingula noted. Upper Abdomen: Known liver lesions.  Left hydronephrosis. Musculoskeletal: Old healed left rib fractures. Old  compression fractures at T6 and T8. IMPRESSION: 1. Peribronchovascular nodule medially in the left upper lobe with associated occlusion of apicoposterior bronchial segments. This is suspicious for a primary bronchogenic carcinoma. 2. Cystic lung lesion with mildly accentuated marginal thickening in the right upper lobe. Low-grade adenocarcinoma not excluded. 3. Scattered enlarged mediastinal adenopathy, suspicious for metastatic disease. 4. Known liver lesions. 5. Left hydronephrosis. 6. Old healed left rib fractures. Old compression fractures at T6 and T8. 7. Aortic Atherosclerosis (ICD10-I70.0) and Emphysema (ICD10-J43.9). Electronically Signed   By: Freida Jes M.D.   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 CONTRAST:  5mL GADAVIST  GADOBUTROL  1 MMOL/ML IV SOLN COMPARISON:  CT abdomen and pelvis dated 07/02/2023, 12/29/2022 FINDINGS: Lower chest: No acute findings. Hepatobiliary: Innumerable peripherally enhancing foci throughout the liver, for example 2.4 x 2.1  cm segment 3 (13:48) and 2.9 x 2.5 cm segment 6 (13:55). No bile duct dilation. Distended gallbladder. Pancreas: No mass, inflammatory changes, or other parenchymal abnormality identified. Spleen:  Within normal limits in size and appearance. Adrenals/Urinary Tract: No adrenal nodules. Similar severe left hydroureteronephrosis to the level the proximal left ureter. Bilateral ureteral stents in-situ. Interpolar right renal simple and hemorrhagic/proteinaceous cysts. Layering fluid level within the left renal collecting system. Stomach/Bowel: Visualized portions within the abdomen are  unremarkable. Vascular/Lymphatic: Slight interval increase in size of an 8 mm left para-aortic lymph node (15:39), previously 6 mm. No abdominal aortic aneurysm demonstrated. Other:  Right lower quadrant ileal conduit with stents in-situ. Musculoskeletal: Multifocal enhancing, diffusion restricting foci within the spine and partially imaged pelvis, suspicious for osseous metastases, for example 1.7 x 1.6 cm anterior segment L4 (15:56) and right transverse process of L2 (15:29). Partially imaged postsurgical changes of the left proximal femur. IMPRESSION: 1. Innumerable peripherally enhancing foci throughout the liver, suspicious for hepatic metastases. 2. Multifocal enhancing, diffusion restricting foci within the spine and partially imaged pelvis, suspicious for osseous metastases, new compared to 12/29/2022. 3. Slight interval increase in size of an 8 mm left para-aortic lymph node, suspicious for nodal metastasis. 4. Similar severe left hydroureteronephrosis to the level the proximal left ureter. Bilateral ureteral stents in-situ. Layering fluid level within the left renal collecting system, which may reflect blood products or debris. Electronically Signed   By: Limin  Xu M.D.   On: 07/03/2023 08:56   CT ABDOMEN PELVIS WO CONTRAST Result Date: 07/02/2023 CLINICAL DATA:  Abdominal pain, acute, nonlocalized. Left-sided flank pain radiating toward left side of abdomen. Urostomy bag with decreased output. EXAM: CT ABDOMEN AND PELVIS WITHOUT CONTRAST TECHNIQUE: Multidetector CT imaging of the abdomen and pelvis was performed following the standard protocol without IV contrast. RADIATION DOSE REDUCTION: This exam was performed according to the departmental dose-optimization program which includes automated exposure control, adjustment of the mA and/or kV according to patient size and/or use of iterative reconstruction technique. COMPARISON:  12/29/2022. FINDINGS: Lower chest: No acute abnormality. Hepatobiliary:  Scattered ill-defined hypodensities are present in the liver measuring up to 2.7 cm, suspicious for metastatic disease. No biliary ductal dilatation is seen. The gallbladder is without stones. Pancreas: Unremarkable. No pancreatic ductal dilatation or surrounding inflammatory changes. Spleen: Normal in size without focal abnormality. Adrenals/Urinary Tract: The adrenal glands are within normal limits. There is severe hydronephrosis on the left with renal cortical thinning. Ureteral stents are noted bilaterally in terminating in a right lower quadrant ileal conduit and ostomy. No renal or ureteral calculus is seen. Peri ureteral fat stranding is present on the left, similar in appearance to the prior exam. The bladder is surgically absent. Stomach/Bowel: The stomach is within normal limits. The appendix is not seen. There are mildly distended loops of bowel in the right lower quadrant measuring up to 3.5 cm adjacent to the ileal conduit. No definite transition 0.18, pneumatosis, or free air is seen. Vascular/Lymphatic: Aortic atherosclerosis. No abdominal or pelvic lymphadenopathy is seen. Reproductive: Status post hysterectomy. No adnexal masses. Other: There is no free fluid in the pelvis. Musculoskeletal: Fixation hardware is noted in the proximal left femur. Degenerative changes are noted in the thoracolumbar spine. Sacral iliac attic an pubic symphysis sclerosis and fragmentation are unchanged. IMPRESSION: 1. Severe left hydronephrosis with renal cortical thinning unchanged. Bilateral ureteral stents are noted with right lower quadrant ileal conduit and ostomy. 2. Mildly distended loops of bowel in the right lower quadrant adjacent  to the ostomy measuring up to 3.5 cm, possibly representing small bowel versus ileal conduit. Repeat evaluation with oral contrast may be beneficial for further evaluation. 3. Multiple hypodensities in the liver measuring up to 2.7 cm, suspicious for metastatic disease. Multiphase CT  or MRI is recommended for further evaluation. 4. Aortic atherosclerosis. Electronically Signed   By: Wyvonnia Heimlich M.D.   On: 07/02/2023 16:34     Discharge Exam: Vitals:   07/07/23 0450 07/07/23 0957  BP: 136/66 124/80  Pulse: 67 66  Resp: 17   Temp: 97.6 F (36.4 C)   SpO2: 96%    Vitals:   07/06/23 1700 07/06/23 1925 07/07/23 0450 07/07/23 0957  BP:  110/60 136/66 124/80  Pulse:  68 67 66  Resp:  16 17   Temp:  98.2 F (36.8 C) 97.6 F (36.4 C)   TempSrc:  Oral Oral   SpO2:  94% 96%   Weight: 55.5 kg     Height:        General: Pt is alert, awake, not in acute distress Cardiovascular: RRR, S1/S2 +, no rubs, no gallops Respiratory: CTA bilaterally, no wheezing, no rhonchi Abdominal: Soft, very mild left CVA tenderness, ND, bowel sounds + Extremities: no edema, no cyanosis    The results of significant diagnostics from this hospitalization (including imaging, microbiology, ancillary and laboratory) are listed below for reference.     Microbiology: Recent Results (from the past 240 hours)  Urine Culture     Status: Abnormal   Collection Time: 07/02/23  2:58 PM   Specimen: Urine, Clean Catch  Result Value Ref Range Status   Specimen Description   Final    URINE, CLEAN CATCH Performed at [facility], [address] [place], [place] [address]    Special Requests   Final    NONE Performed at [facility], [address], [place], [place] [address]    Culture MULTIPLE SPECIES PRESENT, SUGGEST RECOLLECTION (A)  Final   Report Status 07/03/2023 FINAL  Final     Labs: BNP (last 3 results) No results for input(s): BNP in the last 8760 hours. Basic Metabolic Panel: Recent Labs  Lab 07/03/23 0423 07/04/23 0500 07/05/23 0426 07/06/23 0440 07/07/23 0421  NA 134* 135 133* 136 139  K 3.8 3.6 3.1* 4.1 4.0  CL 108 106 100 105 107  CO2 18* 16* 21* 19* 21*  GLUCOSE 106* 116* 120* 102* 93  BUN 25* 26* 23 18 14   CREATININE  2.36* 2.65* 2.73* 2.83* 2.68*  CALCIUM  7.7* 7.7* 7.1* 7.3* 7.2*  MG 1.4* 2.1 1.8  --   --   PHOS 2.3*  --   --   --   --    Liver Function Tests: Recent Labs  Lab 07/02/23 1627  AST 19  ALT 17  ALKPHOS 79  BILITOT 0.7  PROT 7.6  ALBUMIN 3.8   No results for input(s): LIPASE, AMYLASE in the last 168 hours. No results for input(s): AMMONIA in the last 168 hours. CBC: Recent Labs  Lab 07/02/23 1803 07/03/23 0423 07/04/23 0500 07/05/23 0426 07/06/23 0440  WBC 10.9* 10.9* 10.0 7.8 7.6  NEUTROABS 8.3*  --  8.0* 5.5 5.3  HGB 11.6* 10.4* 10.8* 9.8* 9.5*  HCT 35.6* 32.0* 33.8* 30.0* 30.2*  MCV 95.2 95.2 97.1 93.2 95.3  PLT 330 284 279 267 303   Cardiac Enzymes: No results for input(s): CKTOTAL, CKMB, CKMBINDEX, TROPONINI in the last 168 hours. BNP: Invalid input(s): POCBNP  CBG: No results for input(s): GLUCAP in the last 168 hours. D-Dimer No results for input(s): DDIMER in the last 72 hours. Hgb A1c No results for input(s): HGBA1C in the last 72 hours. Lipid Profile No results for input(s): CHOL, HDL, LDLCALC, TRIG, CHOLHDL, LDLDIRECT in the last 72 hours. Thyroid function studies No results for input(s): TSH, T4TOTAL, T3FREE, THYROIDAB in the last 72 hours.  Invalid input(s): FREET3 Anemia work up Recent Labs    07/04/23 1819  VITAMINB12 116*  FOLATE 9.8   Urinalysis    Component Value Date/Time   COLORURINE YELLOW 07/02/2023 1429   APPEARANCEUR HAZY (A) 07/02/2023 1429   LABSPEC 1.004 (L) 07/02/2023 1429   PHURINE 9.0 (H) 07/02/2023 1429   GLUCOSEU NEGATIVE 07/02/2023 1429   HGBUR SMALL (A) 07/02/2023 1429   BILIRUBINUR NEGATIVE 07/02/2023 1429   KETONESUR NEGATIVE 07/02/2023 1429   PROTEINUR 30 (A) 07/02/2023 1429   NITRITE POSITIVE (A) 07/02/2023 1429   LEUKOCYTESUR LARGE (A) 07/02/2023 1429   Sepsis Labs Recent Labs  Lab 07/03/23 0423 07/04/23 0500 07/05/23 0426 07/06/23 0440  WBC 10.9* 10.0 7.8 7.6    Microbiology Recent Results (from the past 240 hours)  Urine Culture     Status: Abnormal   Collection Time: 07/02/23  2:58 PM   Specimen: Urine, Clean Catch  Result Value Ref Range Status   Specimen Description   Final    URINE, CLEAN CATCH Performed at [facility], [address], [place], [place] [address]    Special Requests   Final    NONE Performed at [facility], [address], [place], [place] [address]    Culture MULTIPLE SPECIES PRESENT, SUGGEST RECOLLECTION (A)  Final   Report Status 07/03/2023 FINAL  Final    FURTHER DISCHARGE INSTRUCTIONS:   Get Medicines reviewed and adjusted: Please take all your medications with you for your next visit with your Primary MD   Laboratory/radiological data: Please request your Primary MD to go over all hospital tests and procedure/radiological results at the follow up, please ask your Primary MD to get all Hospital records sent to his/her office.   In some cases, they will be blood work, cultures and biopsy results pending at the time of your discharge. Please request that your primary care M.D. goes through all the records of your hospital data and follows up on these results.   Also Note the following: If you experience worsening of your admission symptoms, develop shortness of breath, life threatening emergency, suicidal or homicidal thoughts you must seek medical attention immediately by calling 911 or calling your MD immediately  if symptoms less severe.   You must read complete instructions/literature along with all the possible adverse reactions/side effects for all the Medicines you take and that have been prescribed to you. Take any new Medicines after you have completely understood and accpet all the possible adverse reactions/side effects.    patient was instructed, not to drive, operate heavy machinery, perform activities at heights, swimming or participation in water activities or  provide baby-sitting services while on Pain, Sleep and Anxiety Medications; until their outpatient Physician has advised to do so again. Also recommended to not to take more than prescribed Pain, Sleep and Anxiety Medications.  It is not advisable to combine anxiety, sleep and pain medications without talking with your primary care provider.     Wear Seat belts while driving.   Please note: You were cared for by a hospitalist during your hospital stay. Once  you are discharged, your primary care physician will handle any further medical issues. Please note that NO REFILLS for any discharge medications will be authorized once you are discharged, as it is imperative that you return to your primary care physician (or establish a relationship with a primary care physician if you do not have one) for your post hospital discharge needs so that they can reassess your need for medications and monitor your lab values  Time coordinating discharge: Over 30 minutes  SIGNED:   Modena Andes, MD  [facility] 07/07/2023, 10:49 AM *Please note that this is a verbal dictation therefore any spelling or grammatical errors are due to the Dragon Medical One system interpretation. If 7PM-7AM, please contact night-coverage [email or web address]

## 2023-07-07 NOTE — Plan of Care (Signed)

## 2023-07-08 LAB — METHYLMALONIC ACID, SERUM: Methylmalonic Acid, Quantitative: 1726 nmol/L — ABNORMAL HIGH (ref 0–378)

## 2023-07-10 ENCOUNTER — Ambulatory Visit: Payer: Self-pay

## 2023-07-10 ENCOUNTER — Other Ambulatory Visit (HOSPITAL_COMMUNITY): Payer: Self-pay

## 2023-07-10 ENCOUNTER — Telehealth: Payer: Self-pay

## 2023-07-10 MED ORDER — OXYCODONE HCL 5 MG PO TABS
5.0000 mg | ORAL_TABLET | Freq: Four times a day (QID) | ORAL | 0 refills | Status: DC | PRN
  Filled 2023-07-10: qty 120, 30d supply, fill #0

## 2023-07-10 NOTE — Telephone Encounter (Signed)
Scheduled appointments with the patient

## 2023-07-11 NOTE — Progress Notes (Unsigned)
 [facility] OFFICE PROGRESS NOTE  Patient Care Team: Maree Leni Edyth DELENA, MD as PCP - General (Family Medicine) O'Neal, Darryle Ned, MD as PCP - Cardiology (Cardiology)  Shelia Chan Shelia Chan y.o. female with medical history significant for SCC urethra, CKD stage IV, 50 pack smoking history, COPD, asthma who was admitted on 07/02/2023 with complaints of abdominal pain found to have diffuse liver lesions.  Assessment & Plan   No orders of the defined types were placed in this encounter.    Shelia [name] [name], MD  INTERVAL HISTORY: Patient returns for follow-up.  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 She has been in remission since.   Oncology History   No history exists.     PHYSICAL EXAMINATION: ECOG PERFORMANCE STATUS: {CHL ONC ECOG PS:[id number]}  There were no vitals filed for this visit. There were no vitals filed for this visit.  Relevant data reviewed during this visit included ***

## 2023-07-12 ENCOUNTER — Other Ambulatory Visit (HOSPITAL_COMMUNITY): Payer: Self-pay

## 2023-07-12 LAB — SURGICAL PATHOLOGY

## 2023-07-12 MED ORDER — PROMETHAZINE HCL 12.5 MG PO TABS
12.5000 mg | ORAL_TABLET | Freq: Three times a day (TID) | ORAL | 0 refills | Status: DC | PRN
  Filled 2023-07-12: qty 30, 10d supply, fill #0

## 2023-07-13 ENCOUNTER — Other Ambulatory Visit: Payer: Self-pay | Admitting: Student

## 2023-07-13 ENCOUNTER — Inpatient Hospital Stay

## 2023-07-13 ENCOUNTER — Ambulatory Visit: Payer: Self-pay

## 2023-07-13 DIAGNOSIS — N184 Chronic kidney disease, stage 4 (severe): Secondary | ICD-10-CM | POA: Diagnosis not present

## 2023-07-13 DIAGNOSIS — E538 Deficiency of other specified B group vitamins: Secondary | ICD-10-CM | POA: Insufficient documentation

## 2023-07-13 DIAGNOSIS — F17210 Nicotine dependence, cigarettes, uncomplicated: Secondary | ICD-10-CM | POA: Diagnosis not present

## 2023-07-13 DIAGNOSIS — D518 Other vitamin B12 deficiency anemias: Secondary | ICD-10-CM

## 2023-07-13 DIAGNOSIS — C7A1 Malignant poorly differentiated neuroendocrine tumors: Secondary | ICD-10-CM | POA: Insufficient documentation

## 2023-07-13 DIAGNOSIS — Z9221 Personal history of antineoplastic chemotherapy: Secondary | ICD-10-CM | POA: Insufficient documentation

## 2023-07-13 DIAGNOSIS — J449 Chronic obstructive pulmonary disease, unspecified: Secondary | ICD-10-CM | POA: Diagnosis not present

## 2023-07-13 DIAGNOSIS — Z8559 Personal history of malignant neoplasm of other urinary tract organ: Secondary | ICD-10-CM | POA: Insufficient documentation

## 2023-07-13 DIAGNOSIS — Z923 Personal history of irradiation: Secondary | ICD-10-CM | POA: Diagnosis not present

## 2023-07-13 LAB — CMP (CANCER CENTER ONLY)
ALT: 24 U/L (ref 0–44)
AST: 31 U/L (ref 15–41)
Albumin: 4.3 g/dL (ref 3.5–5.0)
Alkaline Phosphatase: 169 U/L — ABNORMAL HIGH (ref 38–126)
Anion gap: 10 (ref 5–15)
BUN: 20 mg/dL (ref 8–23)
CO2: 22 mmol/L (ref 22–32)
Calcium: 9.4 mg/dL (ref 8.9–10.3)
Chloride: 102 mmol/L (ref 98–111)
Creatinine: 3.27 mg/dL — ABNORMAL HIGH (ref 0.44–1.00)
GFR, Estimated: 15 mL/min — ABNORMAL LOW (ref >60)
Glucose, Bld: 108 mg/dL — ABNORMAL HIGH (ref 70–99)
Potassium: 4.2 mmol/L (ref 3.5–5.1)
Sodium: 134 mmol/L — ABNORMAL LOW (ref 135–145)
Total Bilirubin: 0.4 mg/dL (ref 0.0–1.2)
Total Protein: 8.2 g/dL — ABNORMAL HIGH (ref 6.5–8.1)

## 2023-07-13 LAB — CBC WITH DIFFERENTIAL (CANCER CENTER ONLY)
Abs Immature Granulocytes: 0.05 10*3/uL (ref 0.00–0.07)
Basophils Absolute: 0.1 10*3/uL (ref 0.0–0.1)
Basophils Relative: 1 %
Eosinophils Absolute: 0.1 10*3/uL (ref 0.0–0.5)
Eosinophils Relative: 1 %
HCT: 35.3 % — ABNORMAL LOW (ref 36.0–46.0)
Hemoglobin: 11.8 g/dL — ABNORMAL LOW (ref 12.0–15.0)
Immature Granulocytes: 1 %
Lymphocytes Relative: 16 %
Lymphs Abs: 1.7 10*3/uL (ref 0.7–4.0)
MCH: 29.9 pg (ref 26.0–34.0)
MCHC: 33.4 g/dL (ref 30.0–36.0)
MCV: 89.6 fL (ref 80.0–100.0)
Monocytes Absolute: 0.9 10*3/uL (ref 0.1–1.0)
Monocytes Relative: 8 %
Neutro Abs: 7.6 10*3/uL (ref 1.7–7.7)
Neutrophils Relative %: 73 %
Platelet Count: 623 10*3/uL — ABNORMAL HIGH (ref 150–400)
RBC: 3.94 MIL/uL (ref 3.87–5.11)
RDW: 13.4 % (ref 11.5–15.5)
WBC Count: 10.3 10*3/uL (ref 4.0–10.5)
nRBC: 0 % (ref 0.0–0.2)

## 2023-07-13 LAB — LACTATE DEHYDROGENASE: LDH: 336 U/L — ABNORMAL HIGH (ref 98–192)

## 2023-07-13 MED ORDER — CYANOCOBALAMIN 1000 MCG/ML IJ SOLN: INTRAMUSCULAR | 0 refills | Status: DC

## 2023-07-13 NOTE — Research (Signed)
 [employer] 2021-05 - Specimen Collection Study to Evaluate Biomarkers in Subjects with Cancer    Patient Shelia Chan was identified by Dr. Tina as a potential candidate for the above listed study.  This Clinical Research Coordinator met with Ambrie Carte, [id number], on 07/13/23 in a manner and location that ensures patient privacy to discuss participation in the above listed research study.  Patient is Accompanied by her daughter.  A copy of the informed consent document with embedded HIPAA language was provided to the patient.  Patient reads, speaks, and understands [place].   Patient was provided with the business card of this Coordinator and encouraged to contact the research team with any questions.  Approximately 10 minutes were spent with the patient reviewing the informed consent documents.  Patient was provided the option of taking informed consent documents home to review and was encouraged to review at their convenience with their support network, including other care providers. Patient took the consent documents home to review.  It was pleasant to talk with Ms. Hemminger. She is interested in participation. Ms. Nesheim was thanked for her time and willingness to participate.  Research team will follow up with her.   Sherwood Castilla, Ph.D. Clinical Research Coordinator [phone] 07/13/2023 3:19 PM

## 2023-07-13 NOTE — Assessment & Plan Note (Signed)
 Due to history of urostomy, she has B12 deficiency.   Start B12 injection daily x 7, followed by weekly x 4, and then monthly.  She will try to learn it today and continue at home.  Prescription sent today

## 2023-07-13 NOTE — Assessment & Plan Note (Signed)
 MRI of brain stat IR port placement CBC, CMP and LDH today Follow up with Dr. Sherrod

## 2023-07-13 NOTE — Progress Notes (Signed)
 I met the pt and her dtr, Mliss, at the pts consult with Dr. Tina.  I introduced myself and explained my role as her nurse navigator. 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 Pt is adverse to radiation but is open to chemotherapy. Because her cancer is lung primary, she will be transferring care to Dr. Sherrod. I told the pt and her dtr that I will try to get them in to see Dr. Sherrod next week. Pt needs a brain MRI. I will schedule for her. Pt uses MyChart and states she will look for the appt there. I gave my card to the pt and her daughter and encouraged them to call me with any questions.

## 2023-07-14 ENCOUNTER — Telehealth: Payer: Self-pay

## 2023-07-14 DIAGNOSIS — C7A1 Malignant poorly differentiated neuroendocrine tumors: Secondary | ICD-10-CM

## 2023-07-14 NOTE — Telephone Encounter (Signed)
 LM for pt with results and recommendations

## 2023-07-14 NOTE — Telephone Encounter (Signed)
-----   Message from [name] [name] [name] sent at 07/13/2023  4:16 PM EDT ----- Sherrilyn please let her know to increase fluid intake. Her renal function went down today. 60-70 oz daily. Her mucosa was very dry on exam. Thanks  ----- Message ----- From: Interface, Lab In [place] Sent: 07/13/2023   3:35 PM EDT To: [name] [name] [name], MD

## 2023-07-14 NOTE — Telephone Encounter (Signed)
 [employer] 2021-05 - Specimen Collection Study to Evaluate Biomarkers in Subjects with Cancer    Shelia Chan was contacted, and a voice message was left informing her that participation in the above study will be placed on hold, as her biopsy tissue may be needed for further diagnostic purposes. The CRC also thanked Shelia Chan for her willingness to contribute to this study. The research team will follow up with her accordingly.   Jaeson Molstad, Ph.D. Clinical Research Coordinator [phone] 07/14/2023 12:13 PM

## 2023-07-15 ENCOUNTER — Ambulatory Visit (HOSPITAL_COMMUNITY)
Admission: RE | Admit: 2023-07-15 | Discharge: 2023-07-15 | Discharge status: Home / Self Care | Source: Ambulatory Visit

## 2023-07-15 DIAGNOSIS — C7A1 Malignant poorly differentiated neuroendocrine tumors: Secondary | ICD-10-CM | POA: Insufficient documentation

## 2023-07-15 MED ORDER — GADOBUTROL 1 MMOL/ML IV SOLN
5.0000 mL | Freq: Once | INTRAVENOUS | Status: AC | PRN
  Administered 2023-07-15: 5 mL via INTRAVENOUS

## 2023-07-17 ENCOUNTER — Encounter (HOSPITAL_COMMUNITY): Payer: Self-pay | Admitting: Emergency Medicine

## 2023-07-17 ENCOUNTER — Other Ambulatory Visit: Payer: Self-pay

## 2023-07-17 ENCOUNTER — Emergency Department (HOSPITAL_COMMUNITY): Admission: EM | Admit: 2023-07-17 | Discharge: 2023-07-18 | Disposition: A | Discharge status: Home / Self Care

## 2023-07-17 ENCOUNTER — Emergency Department (HOSPITAL_COMMUNITY)

## 2023-07-17 DIAGNOSIS — C7A1 Malignant poorly differentiated neuroendocrine tumors: Secondary | ICD-10-CM | POA: Diagnosis not present

## 2023-07-17 DIAGNOSIS — Z8551 Personal history of malignant neoplasm of bladder: Secondary | ICD-10-CM | POA: Insufficient documentation

## 2023-07-17 DIAGNOSIS — N390 Urinary tract infection, site not specified: Secondary | ICD-10-CM | POA: Insufficient documentation

## 2023-07-17 DIAGNOSIS — D72829 Elevated white blood cell count, unspecified: Secondary | ICD-10-CM | POA: Insufficient documentation

## 2023-07-17 DIAGNOSIS — Z7982 Long term (current) use of aspirin: Secondary | ICD-10-CM | POA: Diagnosis not present

## 2023-07-17 DIAGNOSIS — R112 Nausea with vomiting, unspecified: Secondary | ICD-10-CM | POA: Diagnosis present

## 2023-07-17 LAB — URINALYSIS, ROUTINE W REFLEX MICROSCOPIC
Bilirubin Urine: NEGATIVE
Glucose, UA: NEGATIVE mg/dL
Ketones, ur: NEGATIVE mg/dL
Nitrite: POSITIVE — AB
Protein, ur: 30 mg/dL — AB
Specific Gravity, Urine: 1.006 (ref 1.005–1.030)
WBC, UA: >50 WBC/hpf (ref 0–5)
pH: 7 (ref 5.0–8.0)

## 2023-07-17 LAB — CBC
HCT: 32.4 % — ABNORMAL LOW (ref 36.0–46.0)
Hemoglobin: 10.8 g/dL — ABNORMAL LOW (ref 12.0–15.0)
MCH: 30.9 pg (ref 26.0–34.0)
MCHC: 33.3 g/dL (ref 30.0–36.0)
MCV: 92.6 fL (ref 80.0–100.0)
Platelets: 539 10*3/uL — ABNORMAL HIGH (ref 150–400)
RBC: 3.5 MIL/uL — ABNORMAL LOW (ref 3.87–5.11)
RDW: 13.8 % (ref 11.5–15.5)
WBC: 13.4 10*3/uL — ABNORMAL HIGH (ref 4.0–10.5)
nRBC: 0 % (ref 0.0–0.2)

## 2023-07-17 LAB — TROPONIN I (HIGH SENSITIVITY): Troponin I (High Sensitivity): 16 ng/L (ref <18)

## 2023-07-17 MED ORDER — ONDANSETRON 4 MG PO TBDP: 4.0000 mg | ORAL_TABLET | Freq: Three times a day (TID) | ORAL | 0 refills | Status: DC | PRN

## 2023-07-17 MED ORDER — ONDANSETRON HCL 4 MG/2ML IJ SOLN
4.0000 mg | Freq: Once | INTRAMUSCULAR | Status: AC
  Administered 2023-07-17: 4 mg via INTRAVENOUS
  Filled 2023-07-17: qty 2

## 2023-07-17 MED ORDER — MORPHINE SULFATE (PF) 4 MG/ML IV SOLN
4.0000 mg | Freq: Once | INTRAVENOUS | Status: AC
  Administered 2023-07-17: 4 mg via INTRAVENOUS
  Filled 2023-07-17: qty 1

## 2023-07-17 MED ORDER — SODIUM CHLORIDE 0.9 % IV SOLN
1.0000 g | Freq: Once | INTRAVENOUS | Status: AC
  Administered 2023-07-17: 1 g via INTRAVENOUS
  Filled 2023-07-17: qty 10

## 2023-07-17 MED ORDER — LACTATED RINGERS IV BOLUS
1000.0000 mL | Freq: Once | INTRAVENOUS | Status: AC
  Administered 2023-07-17: 1000 mL via INTRAVENOUS

## 2023-07-17 NOTE — ED Notes (Signed)
 Tried to straight stick pt for labs, unsuccessful

## 2023-07-17 NOTE — ED Provider Notes (Signed)
 [place] EMERGENCY DEPARTMENT AT [facility] Provider Note   CSN: [id number] Arrival date & time: 07/17/23  1806     Patient presents with: Emesis   Shelia Chan is a 68 y.o. female.   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  This been going now the past few days.  The patient states that she initially had some dark emesis and stools.  She states now she is having watery stools.  She denies any frank hematemesis.  She denies any fevers.  She states that she has been having some intermittent shortness of breath and cough.  Denies any hemoptysis.  Denies any pleuritic chest pain.   Emesis Associated symptoms: abdominal pain        Prior to Admission medications   Medication Sig Start Date End Date Taking? Authorizing Provider  ondansetron  (ZOFRAN -ODT) 4 MG disintegrating tablet Take 1 tablet (4 mg total) by mouth every 8 (eight) hours as needed for nausea or vomiting. 07/17/23  Yes Ula Prentice SAUNDERS, MD  acetaminophen  (TYLENOL ) 500 MG tablet Take 500-1,000 mg by mouth every 6 (six) hours as needed for fever or mild pain (pain score 1-3) (or headaches).    [provider]  albuterol  (VENTOLIN  HFA) 108 (90 Base) MCG/ACT inhaler Inhale 1 puff into the lungs every 6 (six) hours as needed for shortness of breath. 05/22/20   [provider]  ARIPiprazole  (ABILIFY ) 30 MG tablet Take 30 mg by mouth daily. 03/19/19   [provider]  aspirin  EC 81 MG tablet Take 81 mg by mouth daily.    [provider]  atorvastatin  (LIPITOR) 40 MG tablet Take 1 tablet daily at bedtime. Patient taking differently: Take 40 mg by mouth in the morning. 03/17/22     calcitRIOL  (ROCALTROL ) 0.25 MCG capsule Take 0.25 mcg by mouth daily. 02/12/21   [provider]  cyanocobalamin  (VITAMIN B12) 1000 MCG/ML injection Daily for 6 days, then weekly for 4 times, then monthly 07/13/23   Tina [name] BROCKS, MD  denosumab  (PROLIA ) 60 MG/ML SOSY injection Inject 60 mg [place] every 6 months 05/23/22     furosemide  (LASIX ) 40 MG tablet Take 1 tablet (40 mg total) by mouth daily. 07/09/23   Vernon Ranks, MD  oxyCODONE  (OXY IR/ROXICODONE ) 5 MG immediate release tablet Take 1 tablet (5 mg total) by mouth every 6 (six) hours as needed for severe pain Patient not taking: Reported on 07/02/2023 12/02/22     oxyCODONE  (OXY IR/ROXICODONE ) 5 MG immediate release tablet take 1 tablet (5 mg) by oral route every 6 hours as needed for severe pain Patient not taking: Reported on 07/02/2023 01/02/23     oxyCODONE  (OXY IR/ROXICODONE ) 5 MG immediate release tablet Take 1 tablet (5 mg total) by mouth every 6 (six) hours as needed for severe pain Patient not taking: Reported on 07/02/2023 05/01/23     oxyCODONE  (OXY IR/ROXICODONE ) 5 MG immediate release tablet Take 1 tablet (5 mg total) by mouth every 6 (six) hours as needed for severe pain 06/01/23     oxyCODONE  (OXY IR/ROXICODONE ) 5 MG immediate release tablet Take 1 tablet (5 mg total) by mouth every 6 (six) hours as needed for severe pain 07/10/23     potassium chloride  (KLOR-CON ) 20 MEQ packet Take 20 mEq by mouth daily as needed (for cramping in the body).  [provider]  promethazine  (PHENERGAN ) 12.5 MG tablet Take 1 tablet by mouth every 8 hours as needed for nausea 07/12/23       Allergies: Metoprolol , Shellfish-derived products, and Tape    Review of Systems  Gastrointestinal:  Positive for abdominal pain, nausea and vomiting.  All other systems reviewed and are negative.   Updated Vital Signs BP (!) 161/78   Pulse 67   Temp 97.7 F (36.5 C) (Oral)   Resp (!) 22   SpO2 100%   Physical Exam Vitals and nursing note reviewed.   Gen: Chronically ill-appearing, appears uncomfortable Eyes: PERRL,  EOMI HEENT: no oropharyngeal swelling Neck: trachea midline Resp: clear to auscultation bilaterally Card: RRR, no murmurs, rubs, or gallops Abd: Diffusely tender without guarding or rebound Extremities: no calf tenderness, no edema Vascular: 2+ radial pulses bilaterally, 2+ DP pulses bilaterally Neuro: No focal deficits Skin: no rashes Psyc: acting appropriately   (all labs ordered are listed, but only abnormal results are displayed) Labs Reviewed  CBC - Abnormal; Notable for the following components:      Result Value   WBC 13.4 (*)    RBC 3.50 (*)    Hemoglobin 10.8 (*)    HCT 32.4 (*)    Platelets 539 (*)    All other components within normal limits  URINALYSIS, ROUTINE W REFLEX MICROSCOPIC - Abnormal; Notable for the following components:   APPearance HAZY (*)    Hgb urine dipstick SMALL (*)    Protein, ur 30 (*)    Nitrite POSITIVE (*)    Leukocytes,Ua LARGE (*)    Bacteria, UA FEW (*)    All other components within normal limits  COMPREHENSIVE METABOLIC PANEL WITH GFR  LIPASE, BLOOD  TROPONIN I (HIGH SENSITIVITY)  TROPONIN I (HIGH SENSITIVITY)    EKG: None  Radiology: CT ABDOMEN PELVIS WO CONTRAST Result Date: 07/17/2023 CLINICAL DATA:  Liver biopsy within the last 2 weeks. Diagnosis of liver cancer that has metastasized. Abdominal pain. Nausea, vomiting, diarrhea for 48 hours. Melena and hematemesis. EXAM: CT ABDOMEN AND PELVIS WITHOUT CONTRAST TECHNIQUE: Multidetector CT imaging of the abdomen and pelvis was performed following the standard protocol without IV contrast. RADIATION DOSE REDUCTION: This exam was performed according to the departmental dose-optimization program which includes automated exposure control, adjustment of the mA and/or kV according to patient size and/or use of iterative reconstruction technique. COMPARISON:  CT abdomen pelvis 07/02/2023 FINDINGS: Lower chest: No acute abnormality. Hepatobiliary: Numerous hypoattenuating lesions throughout  the liver compatible with metastases. No acute abnormality. Pancreas: Unremarkable. Spleen: Unremarkable. Adrenals/Urinary Tract: Normal adrenal glands. The previous left hydroureteronephrosis has resolved. Bilateral ureteral stents and right lower quadrant ileal conduit with stents in situ. No urinary calculi or hydronephrosis. Cystectomy. Stomach/Bowel: Normal caliber large and small bowel. No bowel wall thickening. Stomach is within normal limits. The appendix is not visualized. Vascular/Lymphatic: No definite lymphadenopathy noting limitations of noncontrast exam. Aortic atherosclerotic calcification. Reproductive: Hysterectomy.  No adnexal mass. Other: No free intraperitoneal fluid or air. Musculoskeletal: No acute fracture or destructive osseous lesion. Fixation hardware in the left proximal femur. Sclerosis about both SI joints and pubic symphysis and fragmentation are unchanged. Osseous metastases seen on prior MRI are not well visualized on CT. IMPRESSION: 1. No acute abnormality in the abdomen or pelvis. 2. Similar hepatic metastases. 3. Cystectomy with right lower quadrant ileal conduit with bilateral ureteral stents in situ. No hydronephrosis. 4. Aortic Atherosclerosis (ICD10-I70.0). Electronically Signed   By: Norman Gatlin M.D.   On:  07/17/2023 21:50   DG Chest Portable 1 View Result Date: 07/17/2023 CLINICAL DATA:  Vomiting with upper abdominal pain and chest pain. EXAM: PORTABLE CHEST 1 VIEW COMPARISON:  April 21, 2021 FINDINGS: The heart size and mediastinal contours are within normal limits. There is mild to moderate severity calcification of the aortic arch. The lungs are hyperinflated. No acute infiltrate, pleural effusion or pneumothorax is identified. The visualized skeletal structures are unremarkable. IMPRESSION: Hyperinflated lungs without active cardiopulmonary disease. Electronically Signed   By: Suzen Dials M.D.   On: 07/17/2023 21:28     Procedures   Medications Ordered  in the ED  cefTRIAXone  (ROCEPHIN ) 1 g in sodium chloride  0.9 % 100 mL IVPB (has no administration in time range)  lactated ringers  bolus 1,000 mL (1,000 mLs Intravenous New Bag/Given 07/17/23 2308)  morphine  (PF) 4 MG/ML injection 4 mg (4 mg Intravenous Given 07/17/23 2308)  ondansetron  (ZOFRAN ) injection 4 mg (4 mg Intravenous Given 07/17/23 2308)    Clinical Course as of 07/17/23 2310  Mon Jul 17, 2023  2300 Stable Chronically ill.  (Urothelial and metastatic cancer) Malaise and fatigue. Symptomatic care and OP follow up [CC]  2301 Urinalysis, Routine w reflex microscopic -Urine, Clean Catch(!) Ileal Conduit sample.  [CC]    Clinical Course User Index [CC] Jerral Meth, MD                                 Medical Decision Making 68 year old female with bronchogenic carcinoma with hepatic metastasis as well as urothelial cancer presenting to the emergency department today with abdominal pain, nausea, vomiting, diarrhea.  Patient also having some shortness of breath.  Will further evaluate her here with basic labs including LFTs and lipase.  Will also obtain an chest x-ray and CT scan of her abdomen to evaluate for pneumonia or intra-abdominal pathology.  Will give the patient morphine  and Zofran  as well as IV fluids and reevaluate for ultimate disposition.  Based on description of her symptoms and pulse ox of 100% and heart rate of 67 suspicion for pulmonary embolism is low at this time.  The patient's CT scan is unremarkable.  Urinalysis that shows findings consistent with possible UTI but this is from an ileal conduit.  She does have a leukocytosis so we will elect to treat her with Rocephin .  BMP is pending at time of signout.  Will reevaluate for ultimate disposition.  Amount and/or Complexity of Data Reviewed Labs: ordered. Decision-making details documented in ED Course. Radiology: ordered.  Risk Prescription drug management.        Final diagnoses:  Urinary tract  infection without hematuria, site unspecified    ED Discharge Orders          Ordered    ondansetron  (ZOFRAN -ODT) 4 MG disintegrating tablet  Every 8 hours PRN        07/17/23 2310               Ula Prentice SAUNDERS, MD 07/17/23 2310

## 2023-07-17 NOTE — Discharge Instructions (Addendum)
 Your workup today was reassuring.  Please take the antibiotics as well as the zofran  as needed for nausea.  Return to the ER for worsening symptoms.

## 2023-07-17 NOTE — ED Provider Notes (Signed)
 Care of patient received from prior provider at 11:02 PM, please see their note for complete H/P and care plan.  Received handoff per ED course.  Clinical Course as of 07/17/23 2302  Mon Jul 17, 2023  2300 Stable Chronically ill.  (Urothelial and metastatic cancer) Malaise and fatigue. Symptomatic care and OP follow up [CC]  2301 Urinalysis, Routine w reflex microscopic -Urine, Clean Catch(!) Ileal Conduit sample.  [CC]    Clinical Course User Index [CC] Jerral Meth, MD    Reassessment: On reassessment, patient is in complete resolution of all of her discomfort. She has been ambulatory to the restroom and been able to tolerate p.o. intake since she has been here and has had substantial improvement in her syndrome. Working diagnosis is UTI in the setting of ileal conduit given changes in her urinalysis as well as systemic feelings of myalgia and malaise. She has responded well to Rocephin  IV will transition to p.o. Keflex recommend follow-up with PCP within 72 hours for ongoing care and management with strict return precautions reinforced.  Disposition:  I have considered need for hospitalization, however, considering all of the above, I believe this patient is stable for discharge at this time.  Patient/family educated about specific return precautions for given chief complaint and symptoms.  Patient/family educated about follow-up with PCP .     Patient/family expressed understanding of return precautions and need for follow-up. Patient spoken to regarding all imaging and laboratory results and appropriate follow up for these results. All education provided in verbal form with additional information in written form. Time was allowed for answering of patient questions. Patient discharged.    Emergency Department Medication Summary:   Medications  lactated ringers  bolus 1,000 mL (1,000 mLs Intravenous New Bag/Given 07/17/23 2308)  morphine  (PF) 4 MG/ML injection 4 mg (4 mg  Intravenous Given 07/17/23 2308)  ondansetron  (ZOFRAN ) injection 4 mg (4 mg Intravenous Given 07/17/23 2308)  cefTRIAXone  (ROCEPHIN ) 1 g in sodium chloride  0.9 % 100 mL IVPB (1 g Intravenous New Bag/Given 07/17/23 2351)            Jerral Meth, MD 07/18/23 0205

## 2023-07-17 NOTE — ED Triage Notes (Signed)
 BIB EMS from home.  N/V/D x 48 hours.  Had a liver biopsy within the last 2 weeks and found out she has liver CA that has metastasized per pt.  Follow up appt on Thursday.  Reports yesterday her diarrhea was black and vomit was red but today everything is of normal color.  Feels weak. 22G RFA  500 NS bolus, 4 of zofran  by EMS.

## 2023-07-18 LAB — COMPREHENSIVE METABOLIC PANEL WITH GFR
ALT: 26 U/L (ref 0–44)
AST: 38 U/L (ref 15–41)
Albumin: 2.8 g/dL — ABNORMAL LOW (ref 3.5–5.0)
Alkaline Phosphatase: 129 U/L — ABNORMAL HIGH (ref 38–126)
Anion gap: 13 (ref 5–15)
BUN: 23 mg/dL (ref 8–23)
CO2: 18 mmol/L — ABNORMAL LOW (ref 22–32)
Calcium: 9.1 mg/dL (ref 8.9–10.3)
Chloride: 105 mmol/L (ref 98–111)
Creatinine, Ser: 3.03 mg/dL — ABNORMAL HIGH (ref 0.44–1.00)
GFR, Estimated: 16 mL/min — ABNORMAL LOW (ref >60)
Glucose, Bld: 108 mg/dL — ABNORMAL HIGH (ref 70–99)
Potassium: 4.7 mmol/L (ref 3.5–5.1)
Sodium: 136 mmol/L (ref 135–145)
Total Bilirubin: 1 mg/dL (ref 0.0–1.2)
Total Protein: 6.1 g/dL — ABNORMAL LOW (ref 6.5–8.1)

## 2023-07-18 LAB — LIPASE, BLOOD: Lipase: 57 U/L — ABNORMAL HIGH (ref 11–51)

## 2023-07-18 LAB — TROPONIN I (HIGH SENSITIVITY): Troponin I (High Sensitivity): 19 ng/L — ABNORMAL HIGH (ref <18)

## 2023-07-18 MED ORDER — CEPHALEXIN 500 MG PO CAPS: 500.0000 mg | ORAL_CAPSULE | Freq: Two times a day (BID) | ORAL | 0 refills | Status: AC

## 2023-07-19 ENCOUNTER — Other Ambulatory Visit: Payer: Self-pay | Admitting: Physician Assistant

## 2023-07-19 ENCOUNTER — Other Ambulatory Visit: Payer: Self-pay | Admitting: Internal Medicine

## 2023-07-19 DIAGNOSIS — C7A1 Malignant poorly differentiated neuroendocrine tumors: Secondary | ICD-10-CM

## 2023-07-19 DIAGNOSIS — D539 Nutritional anemia, unspecified: Secondary | ICD-10-CM

## 2023-07-19 LAB — URINE CULTURE

## 2023-07-19 NOTE — Progress Notes (Unsigned)
 [facility] [phone]) [phone]   [phone]  CONSULT NOTE  REFERRING PHYSICIAN: Dr. Tina  REASON FOR CONSULTATION:  High Grade Neuroendocrine Carcinoma   HPI Shelia Chan is a 68 y.o. female with a past medical history significant for squamous cell carcinoma of the urethra, CKD stage V, COPD, asthma, and ***referred to the clinic for high-grade neuroendocrine carcinoma.  In summary, the patient was seen in the emergency room from 07/02/2023 to 07/07/23 after presenting to the emergency room with a chief complaint of abdominal pain.  It started approximately 2 nights prior to her admission.  The patient has a stent and she thought this was secondary to stent blockage.  She does have a history of squamous cell carcinoma of the urethra.  This was reportedly treated with surgery, radiation, and chemotherapy which was completed in [place] .  She moved to   approximately 5 years ago.  The patient was previously followed by Dr. Amadeo but was lost to follow-up since 10/2021.  During her workup, his CT on 07/02/2023 of the abdomen that showed severe hydronephrosis with renal cortical thinning.  Was multiple hypodensities in the liver measuring up to 2.7 cm suspicious for metastatic disease.  An MRI was performed on 07/03/2023 that showed innumerable peripherally enhancing foci through the liver suspicious for hepatic metastases.  There was multifocal enhancing/diffusion restriction foci within the spine and partially imaged pelvis suspicious for osseous metastasis which is new compared to 12/29/2022.  There was also a 8 mm left periaortic lymph node suspicious for nodal metastasis.   He had a CT of the chest on 07/05/2023 that showed a primary bronchovascular nodule in the left upper lobe with associated occlusion of the apicoposterior bronchial segments. This is suspicious for a primary bronchogenic carcinoma. There is also a cystic lung lesion with  mildly accentuated marginal thickening in the right upper lobe and scattered enlarged mediastinal adenopathy suspicious for metastatic disease.  Had an ultrasound-guided biopsy of the liver on 07/05/2023 (pathology [phone])  that showed metastatic neuroendocrine neoplasm.   Patient was seen for follow-up visit outpatient on 07/13/2023 by Dr. Tina.  He arranged for brain MRI to complete the staging workup.   The patient is here to establish care with Dr. Sherrod and myself today.  Overall the patient is feeling ***.  She denies any fever, chills, or night sweats.  Weight loss?  Her breathing is***.  She denies any chest pain.  Cough, hemoptysis, or shortness of breath?  She denies any nausea, vomiting, diarrhea, or constipation.  Denies any headache or visual changes.    HPI  Past Medical History:  Diagnosis Date   Arthritis    Asthma    Blood transfusion without reported diagnosis    Cancer (HCC)    CHF (congestive heart failure) (HCC)    COPD (chronic obstructive pulmonary disease) (HCC)    Heart murmur    Immune deficiency disorder (HCC)    Renal disorder     Past Surgical History:  Procedure Laterality Date   ABDOMINAL HYSTERECTOMY     BLADDER REMOVAL  118/22/2015   FRACTURE SURGERY Left 02/2018   pins   IR CATHETER TUBE CHANGE  04/15/2019   IR CATHETER TUBE CHANGE  04/15/2019   IR EXT NEPHROURETERAL CATH EXCHANGE  07/15/2019   IR EXT NEPHROURETERAL CATH EXCHANGE  10/14/2019   IR EXT NEPHROURETERAL CATH EXCHANGE  10/14/2019   IR EXT NEPHROURETERAL CATH EXCHANGE  01/06/2020   IR EXT NEPHROURETERAL CATH EXCHANGE  03/16/2020   IR EXT NEPHROURETERAL CATH EXCHANGE  05/25/2020   IR EXT NEPHROURETERAL CATH EXCHANGE  07/17/2020   IR EXT NEPHROURETERAL CATH EXCHANGE  07/17/2020   IR EXT NEPHROURETERAL CATH EXCHANGE  08/14/2020   IR EXT NEPHROURETERAL CATH EXCHANGE  09/25/2020   IR EXT NEPHROURETERAL CATH EXCHANGE  11/06/2020   IR EXT NEPHROURETERAL CATH EXCHANGE   12/25/2020   IR EXT NEPHROURETERAL CATH EXCHANGE  02/09/2021   IR EXT NEPHROURETERAL CATH EXCHANGE  03/19/2021   IR EXT NEPHROURETERAL CATH EXCHANGE  03/19/2021   IR EXT NEPHROURETERAL CATH EXCHANGE  04/23/2021   IR EXT NEPHROURETERAL CATH EXCHANGE  04/23/2021   IR EXT NEPHROURETERAL CATH EXCHANGE  06/04/2021   IR EXT NEPHROURETERAL CATH EXCHANGE  07/15/2021   IR EXT NEPHROURETERAL CATH EXCHANGE  09/02/2021   IR EXT NEPHROURETERAL CATH EXCHANGE  10/14/2021   IR EXT NEPHROURETERAL CATH EXCHANGE  11/25/2021   IR EXT NEPHROURETERAL CATH EXCHANGE  01/06/2022   IR EXT NEPHROURETERAL CATH EXCHANGE  02/17/2022   IR EXT NEPHROURETERAL CATH EXCHANGE  03/31/2022   IR EXT NEPHROURETERAL CATH EXCHANGE  05/12/2022   IR EXT NEPHROURETERAL CATH EXCHANGE  06/30/2022   IR EXT NEPHROURETERAL CATH EXCHANGE  08/18/2022   IR EXT NEPHROURETERAL CATH EXCHANGE  10/06/2022   IR EXT NEPHROURETERAL CATH EXCHANGE  11/24/2022   IR EXT NEPHROURETERAL CATH EXCHANGE  02/09/2023   IR EXT NEPHROURETERAL CATH EXCHANGE  03/23/2023   IR EXT NEPHROURETERAL CATH EXCHANGE  04/26/2023   IR EXT NEPHROURETERAL CATH EXCHANGE  06/01/2023   IR EXT NEPHROURETERAL CATH EXCHANGE  07/06/2023   IR NEPHROSTOMY EXCHANGE LEFT  12/30/2022   IR NEPHROSTOMY EXCHANGE RIGHT  12/30/2022   IR PERC TUN PERIT CATH WO PORT S&I /IMAG  03/25/2021   IR REMOVAL TUN CV CATH W/O FL  06/04/2021    Family History  Problem Relation Age of Onset   Heart disease Maternal Grandfather     Social History Social History   Tobacco Use   Smoking status: Some Days    Current packs/day: 0.00    Average packs/day: 1 pack/day for 50.0 years (50.0 ttl pk-yrs)    Types: Cigarettes, E-cigarettes    Start date: 01/20/1969    Last attempt to quit: 01/21/2019    Years since quitting: 4.4   Smokeless tobacco: Never   Tobacco comments:    Quit smoking but vapes occasionally  Vaping Use   Vaping status: Never Used  Substance Use Topics   Alcohol use: Not Currently    Drug use: Yes    Types: Marijuana    Comment: occasional     Allergies  Allergen Reactions   Metoprolol  Shortness Of Breath   Shellfish-Derived Products Other (See Comments)    MD said to NOT eat this due to the urostomy   Tape Other (See Comments)    CLEAR, PLASTIC, HOSPITAL TAPE PULLS OFF THE SKIN!!!    Current Outpatient Medications  Medication Sig Dispense Refill   acetaminophen  (TYLENOL ) 500 MG tablet Take 500-1,000 mg by mouth every 6 (six) hours as needed for fever or mild pain (pain score 1-3) (or headaches).     albuterol  (VENTOLIN  HFA) 108 (90 Base) MCG/ACT inhaler Inhale 1 puff into the lungs every 6 (six) hours as needed for shortness of breath.     ARIPiprazole  (ABILIFY ) 30 MG tablet Take 30 mg by mouth daily.     aspirin  EC 81 MG tablet Take 81 mg by mouth daily.     atorvastatin  (LIPITOR) 40  MG tablet Take 1 tablet daily at bedtime. (Patient taking differently: Take 40 mg by mouth in the morning.) 90 tablet 3   calcitRIOL  (ROCALTROL ) 0.25 MCG capsule Take 0.25 mcg by mouth daily.     cephALEXin (KEFLEX) 500 MG capsule Take 1 capsule (500 mg total) by mouth 2 (two) times daily for 5 days. 10 capsule 0   cyanocobalamin  (VITAMIN B12) 1000 MCG/ML injection Daily for 6 days, then weekly for 4 times, then monthly 12 mL 0   denosumab  (PROLIA ) 60 MG/ML SOSY injection Inject 60 mg Carlyss every 6 months 1 mL 2   furosemide  (LASIX ) 40 MG tablet Take 1 tablet (40 mg total) by mouth daily.     ondansetron  (ZOFRAN -ODT) 4 MG disintegrating tablet Take 1 tablet (4 mg total) by mouth every 8 (eight) hours as needed for nausea or vomiting. 20 tablet 0   oxyCODONE  (OXY IR/ROXICODONE ) 5 MG immediate release tablet Take 1 tablet (5 mg total) by mouth every 6 (six) hours as needed for severe pain (Patient not taking: Reported on 07/02/2023) 120 tablet 0   oxyCODONE  (OXY IR/ROXICODONE ) 5 MG immediate release tablet take 1 tablet (5 mg) by oral route every 6 hours as needed for severe pain (Patient  not taking: Reported on 07/02/2023) 120 tablet 0   oxyCODONE  (OXY IR/ROXICODONE ) 5 MG immediate release tablet Take 1 tablet (5 mg total) by mouth every 6 (six) hours as needed for severe pain (Patient not taking: Reported on 07/02/2023) 120 tablet 0   oxyCODONE  (OXY IR/ROXICODONE ) 5 MG immediate release tablet Take 1 tablet (5 mg total) by mouth every 6 (six) hours as needed for severe pain 120 tablet 0   oxyCODONE  (OXY IR/ROXICODONE ) 5 MG immediate release tablet Take 1 tablet (5 mg total) by mouth every 6 (six) hours as needed for severe pain 120 tablet 0   potassium chloride  (KLOR-CON ) 20 MEQ packet Take 20 mEq by mouth daily as needed (for cramping in the body).     promethazine  (PHENERGAN ) 12.5 MG tablet Take 1 tablet by mouth every 8 hours as needed for nausea 30 tablet 0   No current facility-administered medications for this visit.    REVIEW OF SYSTEMS:   Review of Systems  Constitutional: Negative for appetite change, chills, fatigue, fever and unexpected weight change.  HENT:   Negative for mouth sores, nosebleeds, sore throat and trouble swallowing.   Eyes: Negative for eye problems and icterus.  Respiratory: Negative for cough, hemoptysis, shortness of breath and wheezing.   Cardiovascular: Negative for chest pain and leg swelling.  Gastrointestinal: Negative for abdominal pain, constipation, diarrhea, nausea and vomiting.  Genitourinary: Negative for bladder incontinence, difficulty urinating, dysuria, frequency and hematuria.   Musculoskeletal: Negative for back pain, gait problem, neck pain and neck stiffness.  Skin: Negative for itching and rash.  Neurological: Negative for dizziness, extremity weakness, gait problem, headaches, light-headedness and seizures.  Hematological: Negative for adenopathy. Does not bruise/bleed easily.  Psychiatric/Behavioral: Negative for confusion, depression and sleep disturbance. The patient is not nervous/anxious.     PHYSICAL EXAMINATION:   There were no vitals taken for this visit.  ECOG PERFORMANCE STATUS: {CHL ONC ECOG [id number]  Physical Exam  Constitutional: Oriented to person, place, and time and well-developed, well-nourished, and in no distress. No distress.  HENT:  Head: Normocephalic and atraumatic.  Mouth/Throat: Oropharynx is clear and moist. No oropharyngeal exudate.  Eyes: Conjunctivae are normal. Right eye exhibits no discharge. Left eye exhibits no discharge. No scleral icterus.  Neck:  Normal range of motion. Neck supple.  Cardiovascular: Normal rate, regular rhythm, normal heart sounds and intact distal pulses.   Pulmonary/Chest: Effort normal and breath sounds normal. No respiratory distress. No wheezes. No rales.  Abdominal: Soft. Bowel sounds are normal. Exhibits no distension and no mass. There is no tenderness.  Musculoskeletal: Normal range of motion. Exhibits no edema.  Lymphadenopathy:    No cervical adenopathy.  Neurological: Alert and oriented to person, place, and time. Exhibits normal muscle tone. Gait normal. Coordination normal.  Skin: Skin is warm and dry. No rash noted. Not diaphoretic. No erythema. No pallor.  Psychiatric: Mood, memory and judgment normal.  Vitals reviewed.  LABORATORY DATA: Lab Results  Component Value Date   WBC 13.4 (H) 07/17/2023   HGB 10.8 (L) 07/17/2023   HCT 32.4 (L) 07/17/2023   MCV 92.6 07/17/2023   PLT 539 (H) 07/17/2023      Chemistry      Component Value Date/Time   NA 136 07/17/2023 2341   K 4.7 07/17/2023 2341   CL 105 07/17/2023 2341   CO2 18 (L) 07/17/2023 2341   BUN 23 07/17/2023 2341   CREATININE 3.03 (H) 07/17/2023 2341   CREATININE 3.27 (H) 07/13/2023 1514   CREATININE 2.05 (H) 01/27/2022 1006      Component Value Date/Time   CALCIUM  9.1 07/17/2023 2341   ALKPHOS 129 (H) 07/17/2023 2341   AST 38 07/17/2023 2341   AST 31 07/13/2023 1514   ALT 26 07/17/2023 2341   ALT 24 07/13/2023 1514   BILITOT 1.0 07/17/2023 2341   BILITOT  0.4 07/13/2023 1514       RADIOGRAPHIC STUDIES: CT ABDOMEN PELVIS WO CONTRAST Result Date: 07/17/2023 CLINICAL DATA:  Liver biopsy within the last 2 weeks. Diagnosis of liver cancer that has metastasized. Abdominal pain. Nausea, vomiting, diarrhea for 48 hours. Melena and hematemesis. EXAM: CT ABDOMEN AND PELVIS WITHOUT CONTRAST TECHNIQUE: Multidetector CT imaging of the abdomen and pelvis was performed following the standard protocol without IV contrast. RADIATION DOSE REDUCTION: This exam was performed according to the departmental dose-optimization program which includes automated exposure control, adjustment of the mA and/or kV according to patient size and/or use of iterative reconstruction technique. COMPARISON:  CT abdomen pelvis 07/02/2023 FINDINGS: Lower chest: No acute abnormality. Hepatobiliary: Numerous hypoattenuating lesions throughout the liver compatible with metastases. No acute abnormality. Pancreas: Unremarkable. Spleen: Unremarkable. Adrenals/Urinary Tract: Normal adrenal glands. The previous left hydroureteronephrosis has resolved. Bilateral ureteral stents and right lower quadrant ileal conduit with stents in situ. No urinary calculi or hydronephrosis. Cystectomy. Stomach/Bowel: Normal caliber large and small bowel. No bowel wall thickening. Stomach is within normal limits. The appendix is not visualized. Vascular/Lymphatic: No definite lymphadenopathy noting limitations of noncontrast exam. Aortic atherosclerotic calcification. Reproductive: Hysterectomy.  No adnexal mass. Other: No free intraperitoneal fluid or air. Musculoskeletal: No acute fracture or destructive osseous lesion. Fixation hardware in the left proximal femur. Sclerosis about both SI joints and pubic symphysis and fragmentation are unchanged. Osseous metastases seen on prior MRI are not well visualized on CT. IMPRESSION: 1. No acute abnormality in the abdomen or pelvis. 2. Similar hepatic metastases. 3. Cystectomy with  right lower quadrant ileal conduit with bilateral ureteral stents in situ. No hydronephrosis. 4. Aortic Atherosclerosis (ICD10-I70.0). Electronically Signed   By: Norman Gatlin M.D.   On: 07/17/2023 21:50   DG Chest Portable 1 View Result Date: 07/17/2023 CLINICAL DATA:  Vomiting with upper abdominal pain and chest pain. EXAM: PORTABLE CHEST 1 VIEW COMPARISON:  April 21, 2021 FINDINGS: The heart size and mediastinal contours are within normal limits. There is mild to moderate severity calcification of the aortic arch. The lungs are hyperinflated. No acute infiltrate, pleural effusion or pneumothorax is identified. The visualized skeletal structures are unremarkable. IMPRESSION: Hyperinflated lungs without active cardiopulmonary disease. Electronically Signed   By: Suzen Dials M.D.   On: 07/17/2023 21:28   MR Brain W Wo Contrast Result Date: 07/15/2023 CLINICAL DATA:  Provided history: High-grade neuroendocrine carcinoma. Headache with neuroendocrine carcinoma. Rule out metastases. EXAM: MRI HEAD WITHOUT AND WITH CONTRAST TECHNIQUE: Multiplanar, multiecho pulse sequences of the brain and surrounding structures were obtained without and with intravenous contrast. CONTRAST:  5mL GADAVIST  GADOBUTROL  1 MMOL/ML IV SOLN COMPARISON:  Head CT 03/07/2019. FINDINGS: Brain: No age-advanced or lobar predominant cerebral atrophy. Multifocal T2 FLAIR hyperintense signal abnormality within the cerebral white matter, nonspecific but compatible with minimal chronic small vessel ischemic disease. 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 No cortical encephalomalacia is identified. There is no acute infarct. No evidence of an intracranial mass. No chronic intracranial blood products. No extra-axial fluid collection. No midline shift. No pathologic intracranial enhancement identified. Vascular: Maintained flow voids within the proximal large arterial vessels. Skull and upper cervical  spine: 9 mm T1 hypointense osseous lesion at the base of the dens. Report portable question at the posterior aspect of this lesion (see series 7, image 13 and series 18, image 13). No focal worrisome marrow lesion within the calvarium. Sinuses/Orbits: No mass or acute finding within the imaged orbits. No significant paranasal sinus disease. Other: Cysts within the nasopharynx just to the left of midline (9 mm) and within the right eustachian tube (18 mm). Impression #3 will be called to the ordering clinician or representative by the Radiologist Assistant, and communication documented in the PACS or [employer]. IMPRESSION: 1.  No evidence of an acute intracranial abnormality. 2. No evidence of intracranial metastatic disease. 3. 9 mm osseous lesion at the base of the dens as described. Although nonspecific, osseous metastatic disease is a consideration. 4. 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 5. Minor chronic small vessel ischemic changes within the cerebral white matter. 6. Cysts within the nasopharynx just to the left of midline (9 mm) and within the right eustachian tube (18 mm). Electronically Signed   By: Rockey Childs D.O.   On: 07/15/2023 14:52   IR EXT NEPHROURETERAL CATH EXCHANGE Result Date: 07/06/2023 INDICATION: Routine bilateral nephroureteral catheters via ileal conduit exchange. EXAM: Exchange of bilateral nephroureteral catheters with fluoroscopy COMPARISON:  IR nephroureteral catheter exchange Jun 01, 2023 MEDICATIONS: None; The antibiotic was administered in an appropriate time frame prior to skin puncture. ANESTHESIA/SEDATION: Moderate (conscious) sedation was employed during this procedure. A total of Versed  2 mg and Fentanyl  100 mcg was administered intravenously by the radiology nurse. Total intra-service moderate Sedation Time: 10 minutes. The patient's level of consciousness and vital signs were monitored continuously by radiology nursing throughout the  procedure under my direct supervision. CONTRAST:  15 mL Omnipaque  300-administered into the collecting system(s) FLUOROSCOPY: Radiation Exposure Index (as provided by the fluoroscopic device): 5 mGy Kerma COMPLICATIONS: None immediate. PROCEDURE: Informed written consent was obtained from the patient after a thorough discussion of the procedural risks, benefits and alternatives. All questions were addressed. Maximal Sterile Barrier Technique was utilized including caps, mask, sterile gowns, sterile gloves, sterile drape, hand hygiene and skin antiseptic. A timeout was performed prior to the initiation of the procedure.  In a supine position, the right lower quadrant was prepped and draped in the usual sterile fashion. Fluoroscopy demonstrated the catheters to be in similar position as demonstrated during the last exchange. Dilute contrast was injected into both nephroureteral catheters filling the renal pelvis and calices demonstrating moderate to marked hydronephrosis. The catheters were cut. In sequential fashion a guidewire was advanced into the right nephrostomy tube under fluoroscopic guidance. The catheter tip was uncoiled and then the catheter was completely removed from the patient. A new 12 French nephroureteral catheter was then advanced over the guidewire into the ostomy and placed in the renal pelvis. Locking suture was engaged. Guidewire was removed. Guidewire was then advanced into the left nephroureteral catheter and the catheter was removed under fluoroscopic guidance leaving the wire coiled within the renal pelvis. A new 12 French nephroureteral catheter was advanced over the guidewire and coiled within the renal pelvis. Guidewire was removed and the locking suture was engaged. IMPRESSION: Successful exchange of bilateral nephroureteral catheters as described above. The patient will return in approximately 6 weeks for subsequent exchange. Electronically Signed   By: Cordella Banner   On: 07/06/2023  14:16   US  BIOPSY (LIVER) Result Date: 07/05/2023 CLINICAL DATA:  History of urothelial carcinoma. Multiple hepatic lesions on recent MR. Biopsy requested EXAM: ULTRASOUND-GUIDED CORE LIVER LESION BIOPSY TECHNIQUE: An ultrasound guided liver biopsy was thoroughly discussed with the patient and questions were answered. The benefits, risks, alternatives, and complications were also discussed. The patient understands and wishes to proceed with the procedure. A verbal as well as written consent was obtained. Survey ultrasound of the liver was performed, representative lesion localized and an appropriate skin entry site was determined. Skin site was marked, prepped with chlorhexidine , and draped in usual sterile fashion, and infiltrated locally with 1% lidocaine . Intravenous Fentanyl  100mcg and Versed  2mg  were administered by RN during a total moderate (conscious) sedation time of 12 minutes; the patient's level of consciousness and physiological / cardiorespiratory status were monitored continuously by radiology RN under my direct supervision. A 17 gauge trocar needle was advanced under ultrasound guidance into the liver to the margin of the lesion. Multiple coaxial 18gauge core samples were then obtained through the guide needle. The guide needle was removed. Post procedure scans demonstrate no apparent complication. COMPLICATIONS: COMPLICATIONS None immediate FINDINGS: Multiple liver lesions were localized corresponding to MRI findings. A representative right lesion was approached for core biopsy sampling as above. IMPRESSION: 1. Technically successful ultrasound guided core liver lesion biopsy. Electronically Signed   By: JONETTA Faes M.D.   On: 07/05/2023 13:36   CT CHEST WO CONTRAST Result Date: 07/05/2023 CLINICAL DATA:  Urethral squamous cell carcinoma. Multifocal hepatic lesions suspicious for metastatic disease. Suspected osseous metastatic disease. * Tracking Code: BO * EXAM: CT CHEST WITHOUT CONTRAST  TECHNIQUE: Multidetector CT imaging of the chest was performed following the standard protocol without IV contrast. RADIATION DOSE REDUCTION: This exam was performed according to the departmental dose-optimization program which includes automated exposure control, adjustment of the mA and/or kV according to patient size and/or use of iterative reconstruction technique. COMPARISON:  Multiple exams, including CT abdomen 07/02/2023 and chest radiograph 04/21/2021 FINDINGS: Cardiovascular: Atheromatous vascular calcification of the thoracic aorta. Mediastinum/Nodes: Scattered enlarged pre-vascular, AP window, and paratracheal adenopathy. Index right lower paratracheal node 1.8 cm in short axis, image 63 series 2. Lungs/Pleura: Emphysema. 1.5 by 1.6 by 1.9 cm peribronchovascular nodule medially in the left upper lobe with associated occlusion of apicoposterior bronchial segments as on image  49 series 5. Cystic lung lesion with mildly accentuated marginal thickening on image 81 series 5, total lesion measures 1.7 by 1.0 cm with the margin about 1 mm in thickness. Low-grade adenocarcinoma not excluded. A primarily calcified 0.7 by 0.5 cm left upper lobe nodule is shown on image 89 series 5. Other calcified nodularity and scarring in the lingula noted. Upper Abdomen: Known liver lesions.  Left hydronephrosis. Musculoskeletal: Old healed left rib fractures. Old compression fractures at T6 and T8. IMPRESSION: 1. Peribronchovascular nodule medially in the left upper lobe with associated occlusion of apicoposterior bronchial segments. This is suspicious for a primary bronchogenic carcinoma. 2. Cystic lung lesion with mildly accentuated marginal thickening in the right upper lobe. Low-grade adenocarcinoma not excluded. 3. Scattered enlarged mediastinal adenopathy, suspicious for metastatic disease. 4. Known liver lesions. 5. Left hydronephrosis. 6. Old healed left rib fractures. Old compression fractures at T6 and T8. 7. Aortic  Atherosclerosis (ICD10-I70.0) and Emphysema (ICD10-J43.9). Electronically Signed   By: Ryan Salvage M.D.   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 CONTRAST:  5mL GADAVIST  GADOBUTROL  1 MMOL/ML IV SOLN COMPARISON:  CT abdomen and pelvis dated 07/02/2023, 12/29/2022 FINDINGS: Lower chest: No acute findings. Hepatobiliary: Innumerable peripherally enhancing foci throughout the liver, for example 2.4 x 2.1 cm segment 3 (13:48) and 2.9 x 2.5 cm segment 6 (13:55). No bile duct dilation. Distended gallbladder. Pancreas: No mass, inflammatory changes, or other parenchymal abnormality identified. Spleen:  Within normal limits in size and appearance. Adrenals/Urinary Tract: No adrenal nodules. Similar severe left hydroureteronephrosis to the level the proximal left ureter. Bilateral ureteral stents in-situ. Interpolar right renal simple and hemorrhagic/proteinaceous cysts. Layering fluid level within the left renal collecting system. Stomach/Bowel: Visualized portions within the abdomen are unremarkable. Vascular/Lymphatic: Slight interval increase in size of an 8 mm left para-aortic lymph node (15:39), previously 6 mm. No abdominal aortic aneurysm demonstrated. Other:  Right lower quadrant ileal conduit with stents in-situ. Musculoskeletal: Multifocal enhancing, diffusion restricting foci within the spine and partially imaged pelvis, suspicious for osseous metastases, for example 1.7 x 1.6 cm anterior segment L4 (15:56) and right transverse process of L2 (15:29). Partially imaged postsurgical changes of the left proximal femur. IMPRESSION: 1.  Innumerable peripherally enhancing foci throughout the liver, suspicious for hepatic metastases. 2. Multifocal enhancing, diffusion restricting foci within the spine and partially imaged pelvis, suspicious for osseous metastases, new compared to 12/29/2022. 3. Slight interval increase in size of an 8 mm left para-aortic lymph node, suspicious for nodal metastasis. 4. Similar severe left hydroureteronephrosis to the level the proximal left ureter. Bilateral ureteral stents in-situ. Layering fluid level within the left renal collecting system, which may reflect blood products or debris. Electronically Signed   By: Limin  Xu M.D.   On: 07/03/2023 08:56   CT ABDOMEN PELVIS WO CONTRAST Result Date: 07/02/2023 CLINICAL DATA:  Abdominal pain, acute, nonlocalized. Left-sided flank pain radiating toward left side of abdomen. Urostomy bag with decreased output. EXAM: CT ABDOMEN AND PELVIS WITHOUT CONTRAST TECHNIQUE: Multidetector CT imaging of the abdomen and pelvis was performed following the standard protocol without IV contrast. RADIATION DOSE REDUCTION: This exam was performed according to the departmental dose-optimization program which includes automated  exposure control, adjustment of the mA and/or kV according to patient size and/or use of iterative reconstruction technique. COMPARISON:  12/29/2022. FINDINGS: Lower chest: No acute abnormality. Hepatobiliary: Scattered ill-defined hypodensities are present in the liver measuring up to 2.7 cm, suspicious for metastatic disease. No biliary ductal dilatation is seen. The gallbladder is without stones. Pancreas: Unremarkable. No pancreatic ductal dilatation or surrounding inflammatory changes. Spleen: Normal in size without focal abnormality. Adrenals/Urinary Tract: The adrenal glands are within normal limits. There is severe hydronephrosis on the left with renal cortical thinning. Ureteral stents are noted bilaterally in terminating in a right lower quadrant ileal  conduit and ostomy. No renal or ureteral calculus is seen. Peri ureteral fat stranding is present on the left, similar in appearance to the prior exam. The bladder is surgically absent. Stomach/Bowel: The stomach is within normal limits. The appendix is not seen. There are mildly distended loops of bowel in the right lower quadrant measuring up to 3.5 cm adjacent to the ileal conduit. No definite transition 0.18, pneumatosis, or free air is seen. Vascular/Lymphatic: Aortic atherosclerosis. No abdominal or pelvic lymphadenopathy is seen. Reproductive: Status post hysterectomy. No adnexal masses. Other: There is no free fluid in the pelvis. Musculoskeletal: Fixation hardware is noted in the proximal left femur. Degenerative changes are noted in the thoracolumbar spine. Sacral iliac attic an pubic symphysis sclerosis and fragmentation are unchanged. IMPRESSION: 1. Severe left hydronephrosis with renal cortical thinning unchanged. Bilateral ureteral stents are noted with right lower quadrant ileal conduit and ostomy. 2. Mildly distended loops of bowel in the right lower quadrant adjacent to the ostomy measuring up to 3.5 cm, possibly representing small bowel versus ileal conduit. Repeat evaluation with oral contrast may be beneficial for further evaluation. 3. Multiple hypodensities in the liver measuring up to 2.7 cm, suspicious for metastatic disease. Multiphase CT or MRI is recommended for further evaluation. 4. Aortic atherosclerosis. Electronically Signed   By: Leita Birmingham M.D.   On: 07/02/2023 16:34    ASSESSMENT: This is a very pleasant 68 year old Caucasian female with extensive stage high-grade neuroendocrine carcinoma.  The patient presented with left upper lobe lesion, cystic right upper lobe lesion, scattered enlarged mediastinal adenopathy, metastatic disease to the liver, and ***.  Also 9 mm osseous lesion at the base of the dens that could represent metastatic disease.   The patient was seen with  Dr. Sherrod today.  Dr. Sherrod had a lengthy discussion with the patient today about her current condition and treatment options.  Dr. Sherrod explained that her treatment is palliative in nature.  Dr. Sherrod recommends treatment with carboplatin for an AUC of 5 on day 1 and etoposide 100 mg/m on days 1, 2, and 3 IV every 3 weeks and Imfinzi 1500 mg a day 1.  She is interested in this option and she is expected to undergo her first cycle of treatment next week on 07/26/2023.  I will arrange for chemo education class prior to her first cycle of treatment.  Port?  I sent a prescription for Compazine  10 mg p.o. every 6 hours as needed to the patient's pharmacy for nausea and vomiting.  Emla cream?  We will see the patient back for follow-up visit in 2 weeks for evaluation and 1 week follow-up visit to manage any adverse side effects of treatment.  She also needs a PET scan to complete the staging workup.  The patient was advised to call immediately if she has any concerning symptoms in the interval. The patient voices  understanding of current disease status and treatment options and is in agreement with the current care plan. All questions were answered. The patient knows to call the clinic with any problems, questions or concerns. We can certainly see the patient much sooner if necessary     The patient voices understanding of current disease status and treatment options and is in agreement with the current care plan.  All questions were answered. The patient knows to call the clinic with any problems, questions or concerns. We can certainly see the patient much sooner if necessary.  Thank you so much for allowing me to participate in the care of Eunique Dillard Reddin. I will continue to follow up the patient with you and assist in her care.  I spent {CHL ONC TIME VISIT - DTPQU:[id number]} counseling the patient face to face. The total time spent in the appointment was {CHL ONC TIME VISIT  - DTPQU:[id number]}.  Disclaimer: This note was dictated with voice recognition software. Similar sounding words can inadvertently be transcribed and may not be corrected upon review.   Madysin Crisp L Everlena Mackley July 19, 2023, 3:03 PM

## 2023-07-20 ENCOUNTER — Other Ambulatory Visit

## 2023-07-20 ENCOUNTER — Other Ambulatory Visit: Payer: Self-pay | Admitting: Physician Assistant

## 2023-07-20 ENCOUNTER — Inpatient Hospital Stay: Attending: Physician Assistant | Admitting: Physician Assistant

## 2023-07-20 VITALS — BP 131/78 | HR 82 | Temp 97.3°F | Resp 15 | Wt 124.1 lb

## 2023-07-20 DIAGNOSIS — C7B8 Other secondary neuroendocrine tumors: Secondary | ICD-10-CM | POA: Insufficient documentation

## 2023-07-20 DIAGNOSIS — C7A1 Malignant poorly differentiated neuroendocrine tumors: Secondary | ICD-10-CM | POA: Diagnosis not present

## 2023-07-20 DIAGNOSIS — Z7189 Other specified counseling: Secondary | ICD-10-CM | POA: Diagnosis not present

## 2023-07-20 DIAGNOSIS — C3490 Malignant neoplasm of unspecified part of unspecified bronchus or lung: Secondary | ICD-10-CM

## 2023-07-20 DIAGNOSIS — Z8559 Personal history of malignant neoplasm of other urinary tract organ: Secondary | ICD-10-CM | POA: Insufficient documentation

## 2023-07-20 DIAGNOSIS — Z720 Tobacco use: Secondary | ICD-10-CM | POA: Diagnosis not present

## 2023-07-20 DIAGNOSIS — D539 Nutritional anemia, unspecified: Secondary | ICD-10-CM

## 2023-07-20 LAB — FOLATE: Folate: 5.2 ng/mL — ABNORMAL LOW (ref >5.9)

## 2023-07-20 LAB — CBC WITH DIFFERENTIAL (CANCER CENTER ONLY)
Abs Immature Granulocytes: 0.08 10*3/uL — ABNORMAL HIGH (ref 0.00–0.07)
Basophils Absolute: 0.1 10*3/uL (ref 0.0–0.1)
Basophils Relative: 1 %
Eosinophils Absolute: 0 10*3/uL (ref 0.0–0.5)
Eosinophils Relative: 0 %
HCT: 37 % (ref 36.0–46.0)
Hemoglobin: 12.4 g/dL (ref 12.0–15.0)
Immature Granulocytes: 1 %
Lymphocytes Relative: 11 %
Lymphs Abs: 1.2 10*3/uL (ref 0.7–4.0)
MCH: 30 pg (ref 26.0–34.0)
MCHC: 33.5 g/dL (ref 30.0–36.0)
MCV: 89.4 fL (ref 80.0–100.0)
Monocytes Absolute: 0.5 10*3/uL (ref 0.1–1.0)
Monocytes Relative: 5 %
Neutro Abs: 8.4 10*3/uL — ABNORMAL HIGH (ref 1.7–7.7)
Neutrophils Relative %: 82 %
Platelet Count: 568 10*3/uL — ABNORMAL HIGH (ref 150–400)
RBC: 4.14 MIL/uL (ref 3.87–5.11)
RDW: 13.8 % (ref 11.5–15.5)
WBC Count: 10.3 10*3/uL (ref 4.0–10.5)
nRBC: 0 % (ref 0.0–0.2)

## 2023-07-20 LAB — CMP (CANCER CENTER ONLY)
ALT: 37 U/L (ref 0–44)
AST: 53 U/L — ABNORMAL HIGH (ref 15–41)
Albumin: 4.3 g/dL (ref 3.5–5.0)
Alkaline Phosphatase: 272 U/L — ABNORMAL HIGH (ref 38–126)
Anion gap: 12 (ref 5–15)
BUN: 21 mg/dL (ref 8–23)
CO2: 22 mmol/L (ref 22–32)
Calcium: 10.1 mg/dL (ref 8.9–10.3)
Chloride: 101 mmol/L (ref 98–111)
Creatinine: 3.36 mg/dL — ABNORMAL HIGH (ref 0.44–1.00)
GFR, Estimated: 14 mL/min — ABNORMAL LOW (ref >60)
Glucose, Bld: 109 mg/dL — ABNORMAL HIGH (ref 70–99)
Potassium: 4.2 mmol/L (ref 3.5–5.1)
Sodium: 135 mmol/L (ref 135–145)
Total Bilirubin: 0.5 mg/dL (ref 0.0–1.2)
Total Protein: 8.2 g/dL — ABNORMAL HIGH (ref 6.5–8.1)

## 2023-07-20 LAB — VITAMIN B12: Vitamin B-12: >7500 pg/mL — ABNORMAL HIGH (ref 180–914)

## 2023-07-20 LAB — FERRITIN: Ferritin: 326 ng/mL — ABNORMAL HIGH (ref 11–307)

## 2023-07-20 LAB — IRON AND IRON BINDING CAPACITY (CC-WL,HP ONLY)
Iron: 42 ug/dL (ref 28–170)
Saturation Ratios: 14 % (ref 10.4–31.8)
TIBC: 302 ug/dL (ref 250–450)
UIBC: 260 ug/dL (ref 148–442)

## 2023-07-20 MED ORDER — PROCHLORPERAZINE MALEATE 10 MG PO TABS: 10.0000 mg | ORAL_TABLET | Freq: Four times a day (QID) | ORAL | 2 refills | Status: DC | PRN

## 2023-07-20 MED ORDER — LIDOCAINE-PRILOCAINE 2.5-2.5 % EX CREA: 1.0000 | TOPICAL_CREAM | CUTANEOUS | 2 refills | Status: DC | PRN

## 2023-07-20 MED ORDER — ONDANSETRON 8 MG PO TBDP: 8.0000 mg | ORAL_TABLET | Freq: Three times a day (TID) | ORAL | 2 refills | Status: DC | PRN

## 2023-07-20 NOTE — Patient Instructions (Addendum)
-  There are two main categories of lung cancer, they are named based on the size of the cancer cell. One is called Non-Small cell lung cancer. 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 Here are the the main points that were discussed at your office visit with us  today:  -The treatment that you will receive consists of Two chemotherapy drugs, called Carboplatin and Etoposide and One Immunotherapy drug called Imfinzi (Durvalumab)  -We are planning on starting your treatment next week on 07/31/23 but before your start your treatment, I would like you to attend a Chemotherapy Education Class. This involves having you sit down with one of our nurse educators. She will discuss with your one-on-one more details about your treatment as well as general information about resources here at the [facility].  -Your treatment will be given for three consecutive days every 3 weeks. We will check your labs once a week for the first ~4 treatments just to make sure that important components of your blood are in an acceptable range -We will get a CT scan after 2 treatments to check on the progress of treatment  Medications:  -I have sent a few important medication prescriptions to your pharmacy.  -Compazine  was sent to your pharmacy. This medication is for nausea. You may take this every 6 hours as needed if you feel nausous.  -Zofran  sublingual every 8 hours as needed for nausea/vomiting   Side Effects:  -The adverse effect of this treatment including but not limited to alopecia (losing your hair), myelosuppression (drops in the blood counts), nausea and vomiting, peripheral neuropathy (numbness and tingling in the hands and feet), liver or renal dysfunction.   Referrals:  -I will refer you to palliative  care  Imaging:  -I will arrange for a PET scan to complete the staging workup.   Follow up:  -We will see you back for a follow up visit in __ weeks.

## 2023-07-20 NOTE — Progress Notes (Signed)
 Pt was seen by C.Heilingoetter, Onc PA and Dr. Sherrod today. 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 The patient was unaccompanied for this encounter. The plan for the pt is to treat with chemoimmunotherapy with carboplatin/etoposide/imfinzi. Pt is already scheduled to have a port placed on 7/11. Her treatment will start the week of 7/14. Pt is still in need of a PET scan. Pt is amenable to travelling to [facility] for a PET scan if there is availability sooner than at [facility]. Explained to pt that she can have plain water only 6hrs prior to the scan, no candy or sugar free gum or additives, but she can take any necessary morning medications with sips of water. Pt is not diabetic.  Pt currently lives on the 1st floor of her daughter and son in laws home. They plan on having a chairlift installed so she can safely move up and down from 1st to 2nd floor. Pt has a good support system in place. Pt doesn't drive, but has her dtr, Mliss, and son in law, [place], to help her. I offered assistance for the pt's dtr to apply for FMLA, however pt states that her dtrs employer is very flexible and if she needs to miss work, a note from the provider will suffice.  I escorted the pt to scheduling and had her chemo education class schedule on 7/11 at 9am prior to her port placement. I explained the use of the Emla cream to the pt. I also provided a Lung Cancer Journey Book, discussed the [facility] and the services offered, and encouraged the pt to attend the Lung Cancer Support Group.  I provided the pt with my card and encouraged her to call me with any questions or concerns.  After the pt departed, I was able to have her PET scan scheduled for 7/7 at 2pm at [facility].

## 2023-07-20 NOTE — Progress Notes (Signed)
 START ON PATHWAY REGIMEN - Lung Neuroendocrine     Cycles 1 through 4: A cycle is every 21 days:     Durvalumab      Carboplatin      Etoposide    Cycles 5 and beyond: A cycle is every 28 days:     Durvalumab   **Always confirm dose/schedule in your pharmacy ordering system**  Patient Characteristics: Large Cell Neuroendocrine (Metastatic), First Line Disease Classification: Large Cell Neuroendocrine (Metastatic) Line of therapy: First Line Intent of Therapy: Non-Curative / Palliative Intent, Discussed with Patient

## 2023-07-21 ENCOUNTER — Other Ambulatory Visit: Payer: Self-pay

## 2023-07-24 ENCOUNTER — Ambulatory Visit
Admission: RE | Admit: 2023-07-24 | Discharge: 2023-07-24 | Disposition: A | Discharge status: Home / Self Care | Source: Ambulatory Visit | Attending: Physician Assistant | Admitting: Physician Assistant

## 2023-07-24 DIAGNOSIS — I700 Atherosclerosis of aorta: Secondary | ICD-10-CM | POA: Diagnosis not present

## 2023-07-24 DIAGNOSIS — J439 Emphysema, unspecified: Secondary | ICD-10-CM | POA: Insufficient documentation

## 2023-07-24 DIAGNOSIS — C7951 Secondary malignant neoplasm of bone: Secondary | ICD-10-CM | POA: Diagnosis not present

## 2023-07-24 DIAGNOSIS — R188 Other ascites: Secondary | ICD-10-CM | POA: Diagnosis not present

## 2023-07-24 DIAGNOSIS — R599 Enlarged lymph nodes, unspecified: Secondary | ICD-10-CM | POA: Diagnosis not present

## 2023-07-24 DIAGNOSIS — K769 Liver disease, unspecified: Secondary | ICD-10-CM | POA: Diagnosis not present

## 2023-07-24 DIAGNOSIS — R160 Hepatomegaly, not elsewhere classified: Secondary | ICD-10-CM | POA: Diagnosis not present

## 2023-07-24 DIAGNOSIS — C3490 Malignant neoplasm of unspecified part of unspecified bronchus or lung: Secondary | ICD-10-CM | POA: Insufficient documentation

## 2023-07-24 LAB — PROTEIN ELECTROPHORESIS, SERUM, WITH REFLEX
A/G Ratio: 1 (ref 0.7–1.7)
Albumin ELP: 3.8 g/dL (ref 2.9–4.4)
Alpha-1-Globulin: 0.3 g/dL (ref 0.0–0.4)
Alpha-2-Globulin: 1 g/dL (ref 0.4–1.0)
Beta Globulin: 1.1 g/dL (ref 0.7–1.3)
Gamma Globulin: 1.2 g/dL (ref 0.4–1.8)
Globulin, Total: 3.7 g/dL (ref 2.2–3.9)
Total Protein ELP: 7.5 g/dL (ref 6.0–8.5)

## 2023-07-24 LAB — GLUCOSE, CAPILLARY: Glucose-Capillary: 128 mg/dL — ABNORMAL HIGH (ref 70–99)

## 2023-07-24 MED ORDER — FLUDEOXYGLUCOSE F - 18 (FDG) INJECTION
6.4000 | Freq: Once | INTRAVENOUS | Status: AC | PRN
  Administered 2023-07-24: 6.27 via INTRAVENOUS

## 2023-07-24 NOTE — Progress Notes (Signed)
 Pharmacist Chemotherapy Monitoring - Initial Assessment    Anticipated start date: 07/31/2023   The following has been reviewed per standard work regarding the patient's treatment regimen: The patient's diagnosis, treatment plan and drug doses, and organ/hematologic function Lab orders and baseline tests specific to treatment regimen  The treatment plan start date, drug sequencing, and pre-medications Prior authorization status  Patient's documented medication list, including drug-drug interaction screen and prescriptions for anti-emetics and supportive care specific to the treatment regimen The drug concentrations, fluid compatibility, administration routes, and timing of the medications to be used The patient's access for treatment and lifetime cumulative dose history, if applicable  The patient's medication allergies and previous infusion related reactions, if applicable   Changes made to treatment plan:  N/A  Follow up needed:  Pending authorization for treatment    Shelia Chan, RPH, 07/24/2023  2:17 PM

## 2023-07-25 ENCOUNTER — Encounter: Payer: Self-pay | Admitting: Internal Medicine

## 2023-07-27 ENCOUNTER — Other Ambulatory Visit (HOSPITAL_COMMUNITY): Payer: Self-pay

## 2023-07-27 ENCOUNTER — Other Ambulatory Visit: Payer: Self-pay | Admitting: Student

## 2023-07-27 ENCOUNTER — Other Ambulatory Visit: Payer: Self-pay | Admitting: Radiology

## 2023-07-27 MED ORDER — OXYCODONE HCL 10 MG PO TABS
10.0000 mg | ORAL_TABLET | Freq: Three times a day (TID) | ORAL | 0 refills | Status: DC | PRN
  Filled 2023-07-27: qty 90, 30d supply, fill #0

## 2023-07-27 NOTE — H&P (Signed)
 Chief Complaint: High-grade neuroendocrine carcinoma; referred for port a cath placement to assist with treatment  Referring Provider(s): Mohamed,M  Supervising Physician: Luverne Aran  Patient Status: [facility] - Out-pt  History of Present Illness: Shelia Chan is a 68 y.o. 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  The patient presented with a left upper lobe lesion, cystic right upper lobe lesion, scattered enlarged mediastinal adenopathy, metastatic disease to the bone, and metastatic disease to the liver. There was also a  9 mm osseous lesion at the base of the dens that could represent metastatic disease. She is scheduled today for port a cath placement to assist with treatment.    Patient is DNR limited  Past Medical History:  Diagnosis Date   Arthritis    Asthma    Blood transfusion without reported diagnosis    Cancer (HCC)    CHF (congestive heart failure) (HCC)    COPD (chronic obstructive pulmonary disease) (HCC)    Heart murmur    Immune deficiency disorder (HCC)    Renal disorder     Past Surgical History:  Procedure Laterality Date   ABDOMINAL HYSTERECTOMY     BLADDER REMOVAL  118/22/2015   FRACTURE SURGERY Left 02/2018   pins   IR CATHETER TUBE CHANGE  04/15/2019   IR CATHETER TUBE CHANGE  04/15/2019   IR EXT NEPHROURETERAL CATH EXCHANGE  07/15/2019   IR EXT NEPHROURETERAL CATH EXCHANGE  10/14/2019   IR EXT NEPHROURETERAL CATH EXCHANGE  10/14/2019   IR EXT NEPHROURETERAL CATH EXCHANGE  01/06/2020   IR EXT NEPHROURETERAL CATH EXCHANGE  03/16/2020   IR EXT NEPHROURETERAL CATH EXCHANGE  05/25/2020   IR EXT NEPHROURETERAL CATH EXCHANGE  07/17/2020   IR EXT NEPHROURETERAL CATH EXCHANGE  07/17/2020   IR EXT NEPHROURETERAL CATH EXCHANGE  08/14/2020   IR EXT NEPHROURETERAL CATH EXCHANGE  09/25/2020   IR EXT NEPHROURETERAL CATH  EXCHANGE  11/06/2020   IR EXT NEPHROURETERAL CATH EXCHANGE  12/25/2020   IR EXT NEPHROURETERAL CATH EXCHANGE  02/09/2021   IR EXT NEPHROURETERAL CATH EXCHANGE  03/19/2021   IR EXT NEPHROURETERAL CATH EXCHANGE  03/19/2021   IR EXT NEPHROURETERAL CATH EXCHANGE  04/23/2021   IR EXT NEPHROURETERAL CATH EXCHANGE  04/23/2021   IR EXT NEPHROURETERAL CATH EXCHANGE  06/04/2021   IR EXT NEPHROURETERAL CATH EXCHANGE  07/15/2021   IR EXT NEPHROURETERAL CATH EXCHANGE  09/02/2021   IR EXT NEPHROURETERAL CATH EXCHANGE  10/14/2021   IR EXT NEPHROURETERAL CATH EXCHANGE  11/25/2021   IR EXT NEPHROURETERAL CATH EXCHANGE  01/06/2022   IR EXT NEPHROURETERAL CATH EXCHANGE  02/17/2022   IR EXT NEPHROURETERAL CATH EXCHANGE  03/31/2022   IR EXT NEPHROURETERAL CATH EXCHANGE  05/12/2022   IR EXT NEPHROURETERAL CATH EXCHANGE  06/30/2022   IR EXT NEPHROURETERAL CATH EXCHANGE  08/18/2022   IR EXT NEPHROURETERAL CATH EXCHANGE  10/06/2022   IR EXT NEPHROURETERAL CATH EXCHANGE  11/24/2022   IR EXT NEPHROURETERAL CATH EXCHANGE  02/09/2023   IR EXT NEPHROURETERAL CATH EXCHANGE  03/23/2023   IR EXT NEPHROURETERAL CATH EXCHANGE  04/26/2023   IR EXT NEPHROURETERAL CATH EXCHANGE  06/01/2023   IR EXT NEPHROURETERAL CATH EXCHANGE  07/06/2023   IR NEPHROSTOMY EXCHANGE LEFT  12/30/2022   IR NEPHROSTOMY EXCHANGE RIGHT  12/30/2022   IR PERC TUN PERIT CATH WO PORT S&I /IMAG  03/25/2021   IR REMOVAL TUN CV CATH W/O FL  06/04/2021    Allergies: Metoprolol , Shellfish-derived products, and Tape  Medications: Prior to Admission medications   Medication Sig Start Date End Date Taking? Authorizing Provider  acetaminophen  (TYLENOL ) 500 MG tablet Take 500-1,000 mg by mouth every 6 (six) hours as needed for fever or mild pain (pain score 1-3) (or headaches).    [provider]  albuterol  (VENTOLIN  HFA) 108 (90 Base) MCG/ACT inhaler Inhale 1 puff into the lungs every 6 (six) hours as needed for shortness of breath. 05/22/20    [provider]  ARIPiprazole  (ABILIFY ) 30 MG tablet Take 30 mg by mouth daily. 03/19/19   [provider]  aspirin  EC 81 MG tablet Take 81 mg by mouth daily.    [provider]  atorvastatin  (LIPITOR) 40 MG tablet Take 1 tablet daily at bedtime. 03/17/22     calcitRIOL  (ROCALTROL ) 0.25 MCG capsule Take 0.25 mcg by mouth daily. 02/12/21   [provider]  cyanocobalamin  (VITAMIN B12) 1000 MCG/ML injection Daily for 6 days, then weekly for 4 times, then monthly 07/13/23   Tina [name] BROCKS, MD  denosumab  (PROLIA ) 60 MG/ML SOSY injection Inject 60 mg [place] every 6 months 05/23/22     furosemide  (LASIX ) 40 MG tablet Take 1 tablet (40 mg total) by mouth daily. 07/09/23   Vernon Ranks, MD  lidocaine -prilocaine  (EMLA ) cream Apply 1 Application topically as needed. 07/20/23   Heilingoetter, Cassandra L, PA-C  ondansetron  (ZOFRAN -ODT) 8 MG disintegrating tablet Take 1 tablet (8 mg total) by mouth every 8 (eight) hours as needed for nausea or vomiting. Starting day 3 after chemo 07/20/23   Heilingoetter, Cassandra L, PA-C  oxyCODONE  (OXY IR/ROXICODONE ) 5 MG immediate release tablet Take 1 tablet (5 mg total) by mouth every 6 (six) hours as needed for severe pain Patient taking differently: Take 10 mg by mouth every 6 (six) hours as needed. 12/02/22     oxyCODONE  (OXY IR/ROXICODONE ) 5 MG immediate release tablet take 1 tablet (5 mg) by oral route every 6 hours as needed for severe pain Patient not taking: Reported on 07/02/2023 01/02/23     oxyCODONE  (OXY IR/ROXICODONE ) 5 MG immediate release tablet Take 1 tablet (5 mg total) by mouth every 6 (six) hours as needed for severe pain Patient not taking: Reported on 07/02/2023 05/01/23     oxyCODONE  (OXY IR/ROXICODONE ) 5 MG immediate release tablet Take 1 tablet (5 mg total) by mouth every 6 (six) hours as needed for severe pain 06/01/23     oxyCODONE  (OXY IR/ROXICODONE ) 5 MG immediate release tablet Take 1 tablet (5 mg total) by mouth every 6  (six) hours as needed for severe pain 07/10/23     Oxycodone  HCl 10 MG TABS Take 1 tablet by mouth every 8 hours as needed for pain 07/27/23     potassium chloride  (KLOR-CON ) 20 MEQ packet Take 20 mEq by mouth daily as needed (for cramping in the body).    [provider]  prochlorperazine  (COMPAZINE ) 10 MG tablet Take 1 tablet (10 mg total) by mouth every 6 (six) hours as needed. 07/20/23   Heilingoetter, Cassandra L, PA-C  promethazine  (PHENERGAN ) 12.5 MG tablet Take 1 tablet by mouth every 8 hours as needed for nausea 07/12/23        Family History  Problem Relation Age of Onset   Heart disease Maternal Grandfather     Social History   Socioeconomic History   Marital status: Divorced    Spouse name: Not on file   Number of children: Not  on file   Years of education: Not on file   Highest education level: Not on file  Occupational History   Occupation: disabled  Tobacco Use   Smoking status: Some Days    Current packs/day: 0.00    Average packs/day: 1 pack/day for 50.0 years (50.0 ttl pk-yrs)    Types: Cigarettes, E-cigarettes    Start date: 01/20/1969    Last attempt to quit: 01/21/2019    Years since quitting: 4.5   Smokeless tobacco: Never   Tobacco comments:    Quit smoking but vapes occasionally  Vaping Use   Vaping status: Never Used  Substance and Sexual Activity   Alcohol use: Not Currently   Drug use: Yes    Types: Marijuana    Comment: occasional    Sexual activity: Not on file  Other Topics Concern   Not on file  Social History Narrative   Not on file   Social Drivers of Health   Financial Resource Strain: Not on file  Food Insecurity: No Food Insecurity (07/20/2023)   Hunger Vital Sign    Worried About Running Out of Food in the Last Year: Never true    Ran Out of Food in the Last Year: Never true  Transportation Needs: No Transportation Needs (07/20/2023)   PRAPARE - Administrator, Civil Service (Medical): No    Lack of Transportation  (Non-Medical): No  Physical Activity: Not on file  Stress: Not on file  Social Connections: Moderately Integrated (07/02/2023)   Social Connection and Isolation Panel    Frequency of Communication with Friends and Family: More than three times a week    Frequency of Social Gatherings with Friends and Family: Three times a week    Attends Religious Services: More than 4 times per year    Active Member of Clubs or Organizations: Yes    Attends Banker Meetings: More than 4 times per year    Marital Status: Divorced       Review of Systems currently denies fever, headache, chest pain, worsening dyspnea, cough, or bleeding.  She does have some epigastric discomfort, back pain, intermittent nausea vomiting, weakness, dry mouth.  Vital Signs: Vitals:   07/28/23 1200  BP: 105/67  Pulse: 85  Resp: 16  Temp: (!) 97.5 F (36.4 C)  SpO2: 98%      Advance Care Plan: no documents on file.  Physical Exam patient awake, alert.  Cachectic appearing, chest clear to auscultation bilaterally.  Heart with regular rate and rhythm.  Abdomen soft, positive bowel sounds, tenderness epigastric region, intact urostomy with nephroureteral catheters; bilateral lower extremity edema  Imaging: NM PET Image Initial (PI) Skull Base To Thigh Result Date: 07/25/2023 CLINICAL DATA:  Subsequent treatment strategy for small cell lung cancer. 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 EXAM: NUCLEAR MEDICINE PET SKULL BASE TO THIGH TECHNIQUE: 6.3 mCi F-18 FDG was injected intravenously. Full-ring PET imaging was performed from the skull base to thigh after the radiotracer. CT data was obtained and used for attenuation correction and anatomic localization. Fasting blood glucose: 128 mg/dl COMPARISON:  Multiple exams, including CT examinations from 07/17/2023 and 07/05/2023 FINDINGS: Mediastinal blood pool activity: SUV max 2.0 Liver activity: SUV max NA NECK: No significant  abnormal hypermetabolic activity in this region. Incidental CT findings: None. CHEST: The 1.5 cm medial nodule in the left upper lobe adjacent to the aortic arch has a maximum SUV of 6.7, compatible with malignancy. Subsolid nodule along a  bulla anteriorly in the left upper lobe on image 52 series 6 measures 1.3 by 1.0 cm and has a maximum SUV of 1.4, nonspecific but low-grade adenocarcinoma is not excluded. 1.0 by 0.7 cm anterior subpleural left upper lobe nodule on image 42 series 6 with maximum SUV 3.9, compatible with malignancy. Substantial bilateral supraclavicular, prevascular, bilateral paratracheal, AP window, subcarinal, and left hilar hypermetabolic adenopathy observed. Conglomerate AP window adenopathy about 2.6 cm in short axis on image 43 series 6 with maximum SUV 6.3. Index subcarinal node 1.7 cm in short axis on image 47 series 6 with maximum SUV 6.2. Incidental CT findings: Atheromatous thoracic aorta.  Emphysema. ABDOMEN/PELVIS: Innumerable hypermetabolic hepatic masses compatible with active malignancy, some demonstrating central hypointensity compatible with central necrosis. A dominant lesion anteriorly in the lateral segment left hepatic lobe measures proximally 7.0 by 3.4 cm with maximum SUV 7.3. Hepatomegaly noted with indistinct adenopathy along the porta hepatis adjacent to the upper abdominal aorta, index node 1.2 cm in short axis on image 84 series 6 with maximum SUV 6.3. Incidental CT findings: Low-grade subcutaneous activity diffusely in the abdomen/pelvis, likely benign. Renal atrophy with bilateral double-J ureteral stents extending through an ileal conduit. Atherosclerosis is present, including aortoiliac atherosclerotic disease. Pelvic ascites. SKELETON: Multifocal metastatic lesions in the skeleton including the left proximal humerus, left scapula, ribs, bony pelvis, lumbar spine, and thoracic spine. Index lesion anteriorly in the L4 vertebral body with maximum SUV 5.2. Most of  these lesions are poorly apparent on the CT data. Incidental CT findings: Chronic deformities of the pubic bodies bilaterally likely from old fractures. Possible old sacral fractures with degenerative findings along the SI joints. IMPRESSION: 1. Innumerable hypermetabolic hepatic masses compatible with active malignancy. 2. Substantial hypermetabolic adenopathy in the chest and upper abdomen compatible with active malignancy. 3. Multifocal metastatic lesions in the skeleton. 4. Two hypermetabolic nodules in the left upper lobe compatible with malignancy. 5. Subsolid nodule along a bulla anteriorly in the left upper lobe has a maximum SUV of 1.4, nonspecific but low-grade adenocarcinoma is not excluded. 6. Renal atrophy with bilateral double-J ureteral stents extending through an ileal conduit. 7. Pelvic ascites. 8. Aortic Atherosclerosis (ICD10-I70.0) and Emphysema (ICD10-J43.9). Electronically Signed   By: Ryan Salvage M.D.   On: 07/25/2023 10:16   CT ABDOMEN PELVIS WO CONTRAST Result Date: 07/17/2023 CLINICAL DATA:  Liver biopsy within the last 2 weeks. Diagnosis of liver cancer that has metastasized. Abdominal pain. Nausea, vomiting, diarrhea for 48 hours. Melena and hematemesis. EXAM: CT ABDOMEN AND PELVIS WITHOUT CONTRAST TECHNIQUE: Multidetector CT imaging of the abdomen and pelvis was performed following the standard protocol without IV contrast. RADIATION DOSE REDUCTION: This exam was performed according to the departmental dose-optimization program which includes automated exposure control, adjustment of the mA and/or kV according to patient size and/or use of iterative reconstruction technique. COMPARISON:  CT abdomen pelvis 07/02/2023 FINDINGS: Lower chest: No acute abnormality. Hepatobiliary: Numerous hypoattenuating lesions throughout the liver compatible with metastases. No acute abnormality. Pancreas: Unremarkable. Spleen: Unremarkable. Adrenals/Urinary Tract: Normal adrenal glands. The  previous left hydroureteronephrosis has resolved. Bilateral ureteral stents and right lower quadrant ileal conduit with stents in situ. No urinary calculi or hydronephrosis. Cystectomy. Stomach/Bowel: Normal caliber large and small bowel. No bowel wall thickening. Stomach is within normal limits. The appendix is not visualized. Vascular/Lymphatic: No definite lymphadenopathy noting limitations of noncontrast exam. Aortic atherosclerotic calcification. Reproductive: Hysterectomy.  No adnexal mass. Other: No free intraperitoneal fluid or air. Musculoskeletal: No acute fracture or destructive osseous  lesion. Fixation hardware in the left proximal femur. Sclerosis about both SI joints and pubic symphysis and fragmentation are unchanged. Osseous metastases seen on prior MRI are not well visualized on CT. IMPRESSION: 1. No acute abnormality in the abdomen or pelvis. 2. Similar hepatic metastases. 3. Cystectomy with right lower quadrant ileal conduit with bilateral ureteral stents in situ. No hydronephrosis. 4. Aortic Atherosclerosis (ICD10-I70.0). Electronically Signed   By: Norman Gatlin M.D.   On: 07/17/2023 21:50   DG Chest Portable 1 View Result Date: 07/17/2023 CLINICAL DATA:  Vomiting with upper abdominal pain and chest pain. EXAM: PORTABLE CHEST 1 VIEW COMPARISON:  April 21, 2021 FINDINGS: The heart size and mediastinal contours are within normal limits. There is mild to moderate severity calcification of the aortic arch. The lungs are hyperinflated. No acute infiltrate, pleural effusion or pneumothorax is identified. The visualized skeletal structures are unremarkable. IMPRESSION: Hyperinflated lungs without active cardiopulmonary disease. Electronically Signed   By: Suzen Dials M.D.   On: 07/17/2023 21:28   MR Brain W Wo Contrast Result Date: 07/15/2023 CLINICAL DATA:  Provided history: High-grade neuroendocrine carcinoma. Headache with neuroendocrine carcinoma. Rule out metastases. EXAM: MRI HEAD  WITHOUT AND WITH CONTRAST TECHNIQUE: Multiplanar, multiecho pulse sequences of the brain and surrounding structures were obtained without and with intravenous contrast. CONTRAST:  5mL GADAVIST  GADOBUTROL  1 MMOL/ML IV SOLN COMPARISON:  Head CT 03/07/2019. FINDINGS: Brain: No age-advanced or lobar predominant cerebral atrophy. Multifocal T2 FLAIR hyperintense signal abnormality within the cerebral white matter, nonspecific but compatible with minimal chronic small vessel ischemic disease. 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 No cortical encephalomalacia is identified. There is no acute infarct. No evidence of an intracranial mass. No chronic intracranial blood products. No extra-axial fluid collection. No midline shift. No pathologic intracranial enhancement identified. Vascular: Maintained flow voids within the proximal large arterial vessels. Skull and upper cervical spine: 9 mm T1 hypointense osseous lesion at the base of the dens. Report portable question at the posterior aspect of this lesion (see series 7, image 13 and series 18, image 13). No focal worrisome marrow lesion within the calvarium. Sinuses/Orbits: No mass or acute finding within the imaged orbits. No significant paranasal sinus disease. Other: Cysts within the nasopharynx just to the left of midline (9 mm) and within the right eustachian tube (18 mm). Impression #3 will be called to the ordering clinician or representative by the Radiologist Assistant, and communication documented in the PACS or [employer]. IMPRESSION: 1.  No evidence of an acute intracranial abnormality. 2. No evidence of intracranial metastatic disease. 3. 9 mm osseous lesion at the base of the dens as described. Although nonspecific, osseous metastatic disease is a consideration. 4. 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 5. Minor chronic small vessel ischemic changes within the cerebral white matter. 6. Cysts  within the nasopharynx just to the left of midline (9 mm) and within the right eustachian tube (18 mm). Electronically Signed   By: Rockey Childs D.O.   On: 07/15/2023 14:52   IR EXT NEPHROURETERAL CATH EXCHANGE Result Date: 07/06/2023 INDICATION: Routine bilateral nephroureteral catheters via ileal conduit exchange. EXAM: Exchange of bilateral nephroureteral catheters with fluoroscopy COMPARISON:  IR nephroureteral catheter exchange Jun 01, 2023 MEDICATIONS: None; The antibiotic was administered in an appropriate time frame prior to skin puncture. ANESTHESIA/SEDATION: Moderate (conscious) sedation was employed during this procedure. A total of Versed  2 mg and Fentanyl  100 mcg was administered intravenously by the radiology nurse.  Total intra-service moderate Sedation Time: 10 minutes. The patient's level of consciousness and vital signs were monitored continuously by radiology nursing throughout the procedure under my direct supervision. CONTRAST:  15 mL Omnipaque  300-administered into the collecting system(s) FLUOROSCOPY: Radiation Exposure Index (as provided by the fluoroscopic device): 5 mGy Kerma COMPLICATIONS: None immediate. PROCEDURE: Informed written consent was obtained from the patient after a thorough discussion of the procedural risks, benefits and alternatives. All questions were addressed. Maximal Sterile Barrier Technique was utilized including caps, mask, sterile gowns, sterile gloves, sterile drape, hand hygiene and skin antiseptic. A timeout was performed prior to the initiation of the procedure. In a supine position, the right lower quadrant was prepped and draped in the usual sterile fashion. Fluoroscopy demonstrated the catheters to be in similar position as demonstrated during the last exchange. Dilute contrast was injected into both nephroureteral catheters filling the renal pelvis and calices demonstrating moderate to marked hydronephrosis. The catheters were cut. In sequential fashion a  guidewire was advanced into the right nephrostomy tube under fluoroscopic guidance. The catheter tip was uncoiled and then the catheter was completely removed from the patient. A new 12 French nephroureteral catheter was then advanced over the guidewire into the ostomy and placed in the renal pelvis. Locking suture was engaged. Guidewire was removed. Guidewire was then advanced into the left nephroureteral catheter and the catheter was removed under fluoroscopic guidance leaving the wire coiled within the renal pelvis. A new 12 French nephroureteral catheter was advanced over the guidewire and coiled within the renal pelvis. Guidewire was removed and the locking suture was engaged. IMPRESSION: Successful exchange of bilateral nephroureteral catheters as described above. The patient will return in approximately 6 weeks for subsequent exchange. Electronically Signed   By: Cordella Banner   On: 07/06/2023 14:16   US  BIOPSY (LIVER) Result Date: 07/05/2023 CLINICAL DATA:  History of urothelial carcinoma. Multiple hepatic lesions on recent MR. Biopsy requested EXAM: ULTRASOUND-GUIDED CORE LIVER LESION BIOPSY TECHNIQUE: An ultrasound guided liver biopsy was thoroughly discussed with the patient and questions were answered. The benefits, risks, alternatives, and complications were also discussed. The patient understands and wishes to proceed with the procedure. A verbal as well as written consent was obtained. Survey ultrasound of the liver was performed, representative lesion localized and an appropriate skin entry site was determined. Skin site was marked, prepped with chlorhexidine , and draped in usual sterile fashion, and infiltrated locally with 1% lidocaine . Intravenous Fentanyl  100mcg and Versed  2mg  were administered by RN during a total moderate (conscious) sedation time of 12 minutes; the patient's level of consciousness and physiological / cardiorespiratory status were monitored continuously by radiology RN  under my direct supervision. A 17 gauge trocar needle was advanced under ultrasound guidance into the liver to the margin of the lesion. Multiple coaxial 18gauge core samples were then obtained through the guide needle. The guide needle was removed. Post procedure scans demonstrate no apparent complication. COMPLICATIONS: COMPLICATIONS None immediate FINDINGS: Multiple liver lesions were localized corresponding to MRI findings. A representative right lesion was approached for core biopsy sampling as above. IMPRESSION: 1. Technically successful ultrasound guided core liver lesion biopsy. Electronically Signed   By: JONETTA Faes M.D.   On: 07/05/2023 13:36   CT CHEST WO CONTRAST Result Date: 07/05/2023 CLINICAL DATA:  Urethral squamous cell carcinoma. Multifocal hepatic lesions suspicious for metastatic disease. Suspected osseous metastatic disease. * Tracking Code: BO * EXAM: CT CHEST WITHOUT CONTRAST TECHNIQUE: Multidetector CT imaging of the chest was performed following the  standard protocol without IV contrast. RADIATION DOSE REDUCTION: This exam was performed according to the departmental dose-optimization program which includes automated exposure control, adjustment of the mA and/or kV according to patient size and/or use of iterative reconstruction technique. COMPARISON:  Multiple exams, including CT abdomen 07/02/2023 and chest radiograph 04/21/2021 FINDINGS: Cardiovascular: Atheromatous vascular calcification of the thoracic aorta. Mediastinum/Nodes: Scattered enlarged pre-vascular, AP window, and paratracheal adenopathy. Index right lower paratracheal node 1.8 cm in short axis, image 63 series 2. Lungs/Pleura: Emphysema. 1.5 by 1.6 by 1.9 cm peribronchovascular nodule medially in the left upper lobe with associated occlusion of apicoposterior bronchial segments as on image 49 series 5. Cystic lung lesion with mildly accentuated marginal thickening on image 81 series 5, total lesion measures 1.7 by 1.0 cm  with the margin about 1 mm in thickness. Low-grade adenocarcinoma not excluded. A primarily calcified 0.7 by 0.5 cm left upper lobe nodule is shown on image 89 series 5. Other calcified nodularity and scarring in the lingula noted. Upper Abdomen: Known liver lesions.  Left hydronephrosis. Musculoskeletal: Old healed left rib fractures. Old compression fractures at T6 and T8. IMPRESSION: 1. Peribronchovascular nodule medially in the left upper lobe with associated occlusion of apicoposterior bronchial segments. This is suspicious for a primary bronchogenic carcinoma. 2. Cystic lung lesion with mildly accentuated marginal thickening in the right upper lobe. Low-grade adenocarcinoma not excluded. 3. Scattered enlarged mediastinal adenopathy, suspicious for metastatic disease. 4. Known liver lesions. 5. Left hydronephrosis. 6. Old healed left rib fractures. Old compression fractures at T6 and T8. 7. Aortic Atherosclerosis (ICD10-I70.0) and Emphysema (ICD10-J43.9). Electronically Signed   By: Ryan Salvage M.D.   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 CONTRAST:  5mL GADAVIST  GADOBUTROL  1 MMOL/ML IV SOLN COMPARISON:  CT abdomen and pelvis dated 07/02/2023, 12/29/2022 FINDINGS: Lower chest: No acute findings. Hepatobiliary: Innumerable peripherally enhancing foci throughout the liver, for example 2.4 x 2.1 cm segment 3 (13:48) and 2.9 x 2.5 cm segment 6 (13:55). No bile duct dilation. Distended gallbladder. Pancreas: No mass, inflammatory changes, or other parenchymal abnormality identified. Spleen:  Within normal limits in size and appearance.  Adrenals/Urinary Tract: No adrenal nodules. Similar severe left hydroureteronephrosis to the level the proximal left ureter. Bilateral ureteral stents in-situ. Interpolar right renal simple and hemorrhagic/proteinaceous cysts. Layering fluid level within the left renal collecting system. Stomach/Bowel: Visualized portions within the abdomen are unremarkable. Vascular/Lymphatic: Slight interval increase in size of an 8 mm left para-aortic lymph node (15:39), previously 6 mm. No abdominal aortic aneurysm demonstrated. Other:  Right lower quadrant ileal conduit with stents in-situ. Musculoskeletal: Multifocal enhancing, diffusion restricting foci within the spine and partially imaged pelvis, suspicious for osseous metastases, for example 1.7 x 1.6 cm anterior segment L4 (15:56) and right transverse process of L2 (15:29). Partially imaged postsurgical changes of the left proximal femur. IMPRESSION: 1. Innumerable peripherally enhancing foci throughout the liver, suspicious for hepatic metastases. 2. Multifocal enhancing, diffusion restricting foci within the spine and partially imaged pelvis, suspicious for osseous metastases, new compared to 12/29/2022. 3. Slight interval increase in size of an 8 mm left para-aortic lymph node, suspicious for nodal metastasis. 4. Similar severe left hydroureteronephrosis to the level the  proximal left ureter. Bilateral ureteral stents in-situ. Layering fluid level within the left renal collecting system, which may reflect blood products or debris. Electronically Signed   By: Limin  Xu M.D.   On: 07/03/2023 08:56   CT ABDOMEN PELVIS WO CONTRAST Result Date: 07/02/2023 CLINICAL DATA:  Abdominal pain, acute, nonlocalized. Left-sided flank pain radiating toward left side of abdomen. Urostomy bag with decreased output. EXAM: CT ABDOMEN AND PELVIS WITHOUT CONTRAST TECHNIQUE: Multidetector CT imaging of the abdomen and pelvis was performed following the standard protocol without IV  contrast. RADIATION DOSE REDUCTION: This exam was performed according to the departmental dose-optimization program which includes automated exposure control, adjustment of the mA and/or kV according to patient size and/or use of iterative reconstruction technique. COMPARISON:  12/29/2022. FINDINGS: Lower chest: No acute abnormality. Hepatobiliary: Scattered ill-defined hypodensities are present in the liver measuring up to 2.7 cm, suspicious for metastatic disease. No biliary ductal dilatation is seen. The gallbladder is without stones. Pancreas: Unremarkable. No pancreatic ductal dilatation or surrounding inflammatory changes. Spleen: Normal in size without focal abnormality. Adrenals/Urinary Tract: The adrenal glands are within normal limits. There is severe hydronephrosis on the left with renal cortical thinning. Ureteral stents are noted bilaterally in terminating in a right lower quadrant ileal conduit and ostomy. No renal or ureteral calculus is seen. Peri ureteral fat stranding is present on the left, similar in appearance to the prior exam. The bladder is surgically absent. Stomach/Bowel: The stomach is within normal limits. The appendix is not seen. There are mildly distended loops of bowel in the right lower quadrant measuring up to 3.5 cm adjacent to the ileal conduit. No definite transition 0.18, pneumatosis, or free air is seen. Vascular/Lymphatic: Aortic atherosclerosis. No abdominal or pelvic lymphadenopathy is seen. Reproductive: Status post hysterectomy. No adnexal masses. Other: There is no free fluid in the pelvis. Musculoskeletal: Fixation hardware is noted in the proximal left femur. Degenerative changes are noted in the thoracolumbar spine. Sacral iliac attic an pubic symphysis sclerosis and fragmentation are unchanged. IMPRESSION: 1. Severe left hydronephrosis with renal cortical thinning unchanged. Bilateral ureteral stents are noted with right lower quadrant ileal conduit and ostomy. 2.  Mildly distended loops of bowel in the right lower quadrant adjacent to the ostomy measuring up to 3.5 cm, possibly representing small bowel versus ileal conduit. Repeat evaluation with oral contrast may be beneficial for further evaluation. 3. Multiple hypodensities in the liver measuring up to 2.7 cm, suspicious for metastatic disease. Multiphase CT or MRI is recommended for further evaluation. 4. Aortic atherosclerosis. Electronically Signed   By: Leita Birmingham M.D.   On: 07/02/2023 16:34    Labs:  CBC: Recent Labs    07/06/23 0440 07/13/23 1514 07/17/23 2153 07/20/23 0930  WBC 7.6 10.3 13.4* 10.3  HGB 9.5* 11.8* 10.8* 12.4  HCT 30.2* 35.3* 32.4* 37.0  PLT 303 623* 539* 568*    COAGS: Recent Labs    04/26/23 1200 07/04/23 1048  INR 1.0 1.1    BMP: Recent Labs    07/07/23 0421 07/13/23 1514 07/17/23 2341 07/20/23 0930  NA 139 134* 136 135  K 4.0 4.2 4.7 4.2  CL 107 102 105 101  CO2 21* 22 18* 22  GLUCOSE 93 108* 108* 109*  BUN 14 20 23 21   CALCIUM  7.2* 9.4 9.1 10.1  CREATININE 2.68* 3.27* 3.03* 3.36*  GFRNONAA 19* 15* 16* 14*    LIVER FUNCTION TESTS: Recent Labs    07/02/23 1627 07/13/23 1514 07/17/23 2341 07/20/23 0930  BILITOT 0.7 0.4 1.0 0.5  AST 19 31 38 53*  ALT 17 24 26  37  ALKPHOS 79 169* 129* 272*  PROT 7.6 8.2* 6.1* 8.2*  ALBUMIN  3.8 4.3 2.8* 4.3    TUMOR MARKERS: No results for input(s): AFPTM, CEA, CA199, CHROMGRNA in the last 8760 hours.  Assessment and Plan: 68 y.o. 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  The patient presented with a left upper lobe lesion, cystic right upper lobe lesion, scattered enlarged mediastinal adenopathy, metastatic disease to the bone, and metastatic disease to the liver. There was also a  9 mm osseous lesion at the base of the dens that could  represent metastatic disease. She is scheduled today for port a cath placement to assist with treatment.Risks and benefits of image guided port-a-catheter placement was discussed with the patient including, but not limited to bleeding, infection, pneumothorax, or fibrin sheath development and need for additional procedures.  All of the patient's questions were answered, patient is agreeable to proceed. Consent signed and in chart.  Pt known to IR team from multiple prior urological procedures, pubic bone bx 2023, liver lesion bx 07/05/23  Thank you for allowing our service to participate in Shelia Chan 's care.  Electronically Signed: D. Franky Rakers, PA-C   07/27/2023, 4:31 PM      I spent a total of  20 minutes in face to face in clinical consultation, greater than 50% of which was counseling/coordinating care for port a cath placement

## 2023-07-28 ENCOUNTER — Ambulatory Visit (HOSPITAL_COMMUNITY)
Admission: RE | Admit: 2023-07-28 | Discharge: 2023-07-28 | Disposition: A | Discharge status: Home / Self Care | Source: Ambulatory Visit

## 2023-07-28 ENCOUNTER — Encounter: Payer: Self-pay | Admitting: Internal Medicine

## 2023-07-28 ENCOUNTER — Encounter (HOSPITAL_COMMUNITY): Payer: Self-pay

## 2023-07-28 ENCOUNTER — Inpatient Hospital Stay

## 2023-07-28 DIAGNOSIS — Z66 Do not resuscitate: Secondary | ICD-10-CM | POA: Insufficient documentation

## 2023-07-28 DIAGNOSIS — C7A1 Malignant poorly differentiated neuroendocrine tumors: Secondary | ICD-10-CM | POA: Insufficient documentation

## 2023-07-28 DIAGNOSIS — C7951 Secondary malignant neoplasm of bone: Secondary | ICD-10-CM | POA: Insufficient documentation

## 2023-07-28 DIAGNOSIS — C787 Secondary malignant neoplasm of liver and intrahepatic bile duct: Secondary | ICD-10-CM | POA: Diagnosis not present

## 2023-07-28 DIAGNOSIS — F17290 Nicotine dependence, other tobacco product, uncomplicated: Secondary | ICD-10-CM | POA: Insufficient documentation

## 2023-07-28 MED ORDER — FENTANYL CITRATE (PF) 100 MCG/2ML IJ SOLN
INTRAMUSCULAR | Status: AC | PRN
  Administered 2023-07-28: 50 ug via INTRAVENOUS

## 2023-07-28 MED ORDER — LIDOCAINE HCL 1 % IJ SOLN
20.0000 mL | Freq: Once | INTRAMUSCULAR | Status: AC
  Administered 2023-07-28: 20 mL via INTRADERMAL

## 2023-07-28 MED ORDER — LIDOCAINE HCL 1 % IJ SOLN
INTRAMUSCULAR | Status: AC
  Filled 2023-07-28: qty 20

## 2023-07-28 MED ORDER — ONDANSETRON HCL 4 MG/2ML IJ SOLN
4.0000 mg | Freq: Once | INTRAMUSCULAR | Status: AC
  Administered 2023-07-28: 4 mg via INTRAVENOUS
  Filled 2023-07-28: qty 2

## 2023-07-28 MED ORDER — SODIUM CHLORIDE 0.9 % IV SOLN: INTRAVENOUS | Status: DC

## 2023-07-28 MED ORDER — CEFAZOLIN SODIUM-DEXTROSE 2-4 GM/100ML-% IV SOLN
INTRAVENOUS | Status: AC
Start: 2023-07-28 — End: 2023-07-28
  Filled 2023-07-28: qty 100

## 2023-07-28 MED ORDER — FENTANYL CITRATE (PF) 100 MCG/2ML IJ SOLN
INTRAMUSCULAR | Status: AC
Start: 2023-07-28 — End: 2023-07-28
  Filled 2023-07-28: qty 2

## 2023-07-28 MED ORDER — HEPARIN SOD (PORK) LOCK FLUSH 100 UNIT/ML IV SOLN
500.0000 [IU] | Freq: Once | INTRAVENOUS | Status: AC
  Administered 2023-07-28: 500 [IU] via INTRAVENOUS

## 2023-07-28 MED ORDER — MIDAZOLAM HCL 2 MG/2ML IJ SOLN
INTRAMUSCULAR | Status: AC | PRN
  Administered 2023-07-28: 1 mg via INTRAVENOUS

## 2023-07-28 MED ORDER — HEPARIN SOD (PORK) LOCK FLUSH 100 UNIT/ML IV SOLN
INTRAVENOUS | Status: AC
  Filled 2023-07-28: qty 5

## 2023-07-28 MED ORDER — CEFAZOLIN SODIUM-DEXTROSE 2-4 GM/100ML-% IV SOLN
2.0000 g | INTRAVENOUS | Status: AC
Start: 2023-07-28 — End: 2023-07-28
  Administered 2023-07-28: 2 g via INTRAVENOUS

## 2023-07-28 MED ORDER — MIDAZOLAM HCL 2 MG/2ML IJ SOLN
INTRAMUSCULAR | Status: AC
  Filled 2023-07-28: qty 2

## 2023-07-28 MED FILL — Fosaprepitant Dimeglumine For IV Infusion 150 MG (Base Eq): INTRAVENOUS | Qty: 5 | Status: AC

## 2023-07-28 NOTE — Procedures (Signed)
 Interventional Radiology Procedure Note  Procedure: Single Lumen Power Port Placement    Access:  Right IJ vein.  Findings: Catheter tip positioned at SVC/RA junction. Port is ready for immediate use.   Complications: None  EBL: < 10 mL  Recommendations:  - Ok to shower in 24 hours - Do not submerge for 7 days - Routine line care   Maxi Carreras T. Fredia Sorrow, M.D Pager:  [phone]

## 2023-07-28 NOTE — Discharge Instructions (Signed)
 PLEASE CALL INTERVENTIONAL RADIOLOGY CLINIC [phone] WITH ANY QUESTIONS OR CONCERNS  YOU MAY REMOVE YOUR DRESSING AND SHOWER TOMORROW

## 2023-07-31 ENCOUNTER — Ambulatory Visit (HOSPITAL_BASED_OUTPATIENT_CLINIC_OR_DEPARTMENT_OTHER): Admitting: Physician Assistant

## 2023-07-31 ENCOUNTER — Inpatient Hospital Stay

## 2023-07-31 ENCOUNTER — Emergency Department (HOSPITAL_COMMUNITY)

## 2023-07-31 ENCOUNTER — Telehealth: Payer: Self-pay

## 2023-07-31 ENCOUNTER — Other Ambulatory Visit: Payer: Self-pay | Admitting: Physician Assistant

## 2023-07-31 ENCOUNTER — Other Ambulatory Visit: Payer: Self-pay

## 2023-07-31 ENCOUNTER — Encounter: Payer: Self-pay | Admitting: Internal Medicine

## 2023-07-31 ENCOUNTER — Inpatient Hospital Stay (HOSPITAL_COMMUNITY)
Admission: EM | Admit: 2023-07-31 | Discharge: 2023-08-18 | DRG: 871 | Disposition: E | Source: Ambulatory Visit | Attending: Pulmonary Disease | Admitting: Pulmonary Disease

## 2023-07-31 VITALS — BP 91/49 | HR 98 | Temp 97.9°F | Resp 17 | Wt 125.2 lb

## 2023-07-31 VITALS — BP 84/51 | HR 95 | Temp 97.9°F | Resp 18

## 2023-07-31 DIAGNOSIS — J440 Chronic obstructive pulmonary disease with acute lower respiratory infection: Secondary | ICD-10-CM | POA: Diagnosis not present

## 2023-07-31 DIAGNOSIS — I219 Acute myocardial infarction, unspecified: Secondary | ICD-10-CM | POA: Diagnosis not present

## 2023-07-31 DIAGNOSIS — J45909 Unspecified asthma, uncomplicated: Secondary | ICD-10-CM | POA: Diagnosis present

## 2023-07-31 DIAGNOSIS — E861 Hypovolemia: Secondary | ICD-10-CM | POA: Diagnosis present

## 2023-07-31 DIAGNOSIS — R109 Unspecified abdominal pain: Secondary | ICD-10-CM | POA: Insufficient documentation

## 2023-07-31 DIAGNOSIS — Z515 Encounter for palliative care: Secondary | ICD-10-CM

## 2023-07-31 DIAGNOSIS — E538 Deficiency of other specified B group vitamins: Secondary | ICD-10-CM | POA: Diagnosis present

## 2023-07-31 DIAGNOSIS — E785 Hyperlipidemia, unspecified: Secondary | ICD-10-CM | POA: Diagnosis present

## 2023-07-31 DIAGNOSIS — E871 Hypo-osmolality and hyponatremia: Secondary | ICD-10-CM

## 2023-07-31 DIAGNOSIS — F419 Anxiety disorder, unspecified: Secondary | ICD-10-CM | POA: Diagnosis present

## 2023-07-31 DIAGNOSIS — Z66 Do not resuscitate: Secondary | ICD-10-CM | POA: Diagnosis present

## 2023-07-31 DIAGNOSIS — D649 Anemia, unspecified: Secondary | ICD-10-CM | POA: Diagnosis present

## 2023-07-31 DIAGNOSIS — N184 Chronic kidney disease, stage 4 (severe): Secondary | ICD-10-CM | POA: Diagnosis present

## 2023-07-31 DIAGNOSIS — I890 Lymphedema, not elsewhere classified: Secondary | ICD-10-CM | POA: Diagnosis present

## 2023-07-31 DIAGNOSIS — I959 Hypotension, unspecified: Secondary | ICD-10-CM

## 2023-07-31 DIAGNOSIS — R112 Nausea with vomiting, unspecified: Secondary | ICD-10-CM | POA: Diagnosis present

## 2023-07-31 DIAGNOSIS — C3490 Malignant neoplasm of unspecified part of unspecified bronchus or lung: Secondary | ICD-10-CM

## 2023-07-31 DIAGNOSIS — M797 Fibromyalgia: Secondary | ICD-10-CM | POA: Insufficient documentation

## 2023-07-31 DIAGNOSIS — Z906 Acquired absence of other parts of urinary tract: Secondary | ICD-10-CM

## 2023-07-31 DIAGNOSIS — E8809 Other disorders of plasma-protein metabolism, not elsewhere classified: Secondary | ICD-10-CM | POA: Diagnosis present

## 2023-07-31 DIAGNOSIS — Z8559 Personal history of malignant neoplasm of other urinary tract organ: Secondary | ICD-10-CM

## 2023-07-31 DIAGNOSIS — J441 Chronic obstructive pulmonary disease with (acute) exacerbation: Secondary | ICD-10-CM | POA: Diagnosis not present

## 2023-07-31 DIAGNOSIS — N179 Acute kidney failure, unspecified: Secondary | ICD-10-CM | POA: Diagnosis not present

## 2023-07-31 DIAGNOSIS — F418 Other specified anxiety disorders: Secondary | ICD-10-CM | POA: Diagnosis present

## 2023-07-31 DIAGNOSIS — F17210 Nicotine dependence, cigarettes, uncomplicated: Secondary | ICD-10-CM | POA: Diagnosis present

## 2023-07-31 DIAGNOSIS — D518 Other vitamin B12 deficiency anemias: Secondary | ICD-10-CM

## 2023-07-31 DIAGNOSIS — C7A8 Other malignant neuroendocrine tumors: Secondary | ICD-10-CM

## 2023-07-31 DIAGNOSIS — N261 Atrophy of kidney (terminal): Secondary | ICD-10-CM | POA: Diagnosis present

## 2023-07-31 DIAGNOSIS — Z8589 Personal history of malignant neoplasm of other organs and systems: Secondary | ICD-10-CM

## 2023-07-31 DIAGNOSIS — E860 Dehydration: Secondary | ICD-10-CM | POA: Diagnosis present

## 2023-07-31 DIAGNOSIS — F32A Depression, unspecified: Secondary | ICD-10-CM | POA: Diagnosis present

## 2023-07-31 DIAGNOSIS — A419 Sepsis, unspecified organism: Secondary | ICD-10-CM | POA: Diagnosis not present

## 2023-07-31 DIAGNOSIS — I5032 Chronic diastolic (congestive) heart failure: Secondary | ICD-10-CM | POA: Diagnosis present

## 2023-07-31 DIAGNOSIS — J189 Pneumonia, unspecified organism: Secondary | ICD-10-CM | POA: Diagnosis not present

## 2023-07-31 DIAGNOSIS — R571 Hypovolemic shock: Secondary | ICD-10-CM | POA: Diagnosis present

## 2023-07-31 DIAGNOSIS — C7B8 Other secondary neuroendocrine tumors: Secondary | ICD-10-CM | POA: Diagnosis present

## 2023-07-31 DIAGNOSIS — R6521 Severe sepsis with septic shock: Secondary | ICD-10-CM | POA: Diagnosis present

## 2023-07-31 DIAGNOSIS — Z7982 Long term (current) use of aspirin: Secondary | ICD-10-CM

## 2023-07-31 DIAGNOSIS — R160 Hepatomegaly, not elsewhere classified: Secondary | ICD-10-CM | POA: Diagnosis present

## 2023-07-31 DIAGNOSIS — N170 Acute kidney failure with tubular necrosis: Secondary | ICD-10-CM | POA: Diagnosis present

## 2023-07-31 DIAGNOSIS — C7A1 Malignant poorly differentiated neuroendocrine tumors: Secondary | ICD-10-CM

## 2023-07-31 DIAGNOSIS — E872 Acidosis, unspecified: Secondary | ICD-10-CM | POA: Diagnosis present

## 2023-07-31 LAB — CMP (CANCER CENTER ONLY)
ALT: 9 U/L (ref 0–44)
AST: 90 U/L — ABNORMAL HIGH (ref 15–41)
Albumin: 3.4 g/dL — ABNORMAL LOW (ref 3.5–5.0)
Alkaline Phosphatase: 285 U/L — ABNORMAL HIGH (ref 38–126)
Anion gap: 14 (ref 5–15)
BUN: 43 mg/dL — ABNORMAL HIGH (ref 8–23)
CO2: 15 mmol/L — ABNORMAL LOW (ref 22–32)
Calcium: 8.4 mg/dL — ABNORMAL LOW (ref 8.9–10.3)
Chloride: 99 mmol/L (ref 98–111)
Creatinine: 6.35 mg/dL — ABNORMAL HIGH (ref 0.44–1.00)
GFR, Estimated: 7 mL/min — ABNORMAL LOW (ref >60)
Glucose, Bld: 101 mg/dL — ABNORMAL HIGH (ref 70–99)
Potassium: 4.9 mmol/L (ref 3.5–5.1)
Sodium: 128 mmol/L — ABNORMAL LOW (ref 135–145)
Total Bilirubin: 1.3 mg/dL — ABNORMAL HIGH (ref 0.0–1.2)
Total Protein: 6.5 g/dL (ref 6.5–8.1)

## 2023-07-31 LAB — CBC WITH DIFFERENTIAL (CANCER CENTER ONLY)
Abs Immature Granulocytes: 0.25 K/uL — ABNORMAL HIGH (ref 0.00–0.07)
Basophils Absolute: 0.1 K/uL (ref 0.0–0.1)
Basophils Relative: 0 %
Eosinophils Absolute: 0 K/uL (ref 0.0–0.5)
Eosinophils Relative: 0 %
HCT: 28.1 % — ABNORMAL LOW (ref 36.0–46.0)
Hemoglobin: 9.7 g/dL — ABNORMAL LOW (ref 12.0–15.0)
Immature Granulocytes: 2 %
Lymphocytes Relative: 4 %
Lymphs Abs: 0.6 K/uL — ABNORMAL LOW (ref 0.7–4.0)
MCH: 30.4 pg (ref 26.0–34.0)
MCHC: 34.5 g/dL (ref 30.0–36.0)
MCV: 88.1 fL (ref 80.0–100.0)
Monocytes Absolute: 0.8 K/uL (ref 0.1–1.0)
Monocytes Relative: 6 %
Neutro Abs: 11.7 K/uL — ABNORMAL HIGH (ref 1.7–7.7)
Neutrophils Relative %: 88 %
Platelet Count: 321 K/uL (ref 150–400)
RBC: 3.19 MIL/uL — ABNORMAL LOW (ref 3.87–5.11)
RDW: 15.2 % (ref 11.5–15.5)
WBC Count: 13.3 K/uL — ABNORMAL HIGH (ref 4.0–10.5)
nRBC: 0 % (ref 0.0–0.2)

## 2023-07-31 LAB — TSH: TSH: 4.05 u[IU]/mL (ref 0.350–4.500)

## 2023-07-31 MED ORDER — SODIUM CHLORIDE 0.9 % IV SOLN: INTRAVENOUS | Status: DC

## 2023-07-31 MED ORDER — HYDROMORPHONE HCL 1 MG/ML IJ SOLN
0.5000 mg | Freq: Once | INTRAMUSCULAR | Status: AC
  Administered 2023-07-31: 0.5 mg via INTRAVENOUS
  Filled 2023-07-31: qty 1

## 2023-07-31 MED ORDER — SODIUM CHLORIDE 0.9 % IV BOLUS: 1000.0000 mL | Freq: Once | INTRAVENOUS | Status: DC

## 2023-07-31 MED ORDER — ACETAMINOPHEN 325 MG PO TABS: 650.0000 mg | ORAL_TABLET | Freq: Four times a day (QID) | ORAL | Status: DC | PRN

## 2023-07-31 MED ORDER — SODIUM CHLORIDE 0.9% FLUSH
10.0000 mL | Freq: Once | INTRAVENOUS | Status: AC
  Administered 2023-07-31: 10 mL

## 2023-07-31 MED ORDER — ONDANSETRON HCL 4 MG/2ML IJ SOLN
4.0000 mg | Freq: Once | INTRAMUSCULAR | Status: AC
  Administered 2023-07-31: 4 mg via INTRAVENOUS
  Filled 2023-07-31: qty 2

## 2023-07-31 MED ORDER — OXYCODONE HCL 5 MG PO TABS
10.0000 mg | ORAL_TABLET | Freq: Four times a day (QID) | ORAL | Status: DC | PRN
  Administered 2023-08-01 – 2023-08-05 (×5): 10 mg via ORAL
  Filled 2023-07-31 (×5): qty 2

## 2023-07-31 MED ORDER — ONDANSETRON HCL 4 MG PO TABS
4.0000 mg | ORAL_TABLET | Freq: Four times a day (QID) | ORAL | Status: DC | PRN
  Administered 2023-08-01: 4 mg via ORAL
  Filled 2023-07-31: qty 1

## 2023-07-31 MED ORDER — CHLORHEXIDINE GLUCONATE CLOTH 2 % EX PADS
6.0000 | MEDICATED_PAD | Freq: Every day | CUTANEOUS | Status: DC
  Administered 2023-07-31 – 2023-08-05 (×6): 6 via TOPICAL

## 2023-07-31 MED ORDER — ACETAMINOPHEN 650 MG RE SUPP: 650.0000 mg | Freq: Four times a day (QID) | RECTAL | Status: DC | PRN

## 2023-07-31 MED ORDER — ONDANSETRON HCL 4 MG/2ML IJ SOLN: 4.0000 mg | Freq: Four times a day (QID) | INTRAMUSCULAR | Status: DC | PRN

## 2023-07-31 MED ORDER — LACTATED RINGERS IV BOLUS
1000.0000 mL | Freq: Once | INTRAVENOUS | Status: AC
  Administered 2023-07-31: 1000 mL via INTRAVENOUS

## 2023-07-31 MED ORDER — PROCHLORPERAZINE EDISYLATE 10 MG/2ML IJ SOLN: 5.0000 mg | INTRAMUSCULAR | Status: DC | PRN

## 2023-07-31 MED ORDER — SODIUM CHLORIDE 0.9% FLUSH: 10.0000 mL | INTRAVENOUS | Status: DC | PRN

## 2023-07-31 MED ORDER — METOCLOPRAMIDE HCL 5 MG/ML IJ SOLN
5.0000 mg | Freq: Three times a day (TID) | INTRAMUSCULAR | Status: DC
  Administered 2023-07-31 – 2023-08-05 (×14): 5 mg via INTRAVENOUS
  Filled 2023-07-31 (×14): qty 2

## 2023-07-31 MED ORDER — HYDROMORPHONE HCL 1 MG/ML IJ SOLN
1.0000 mg | INTRAMUSCULAR | Status: AC | PRN
  Administered 2023-08-01 (×2): 1 mg via INTRAVENOUS
  Filled 2023-07-31 (×2): qty 1

## 2023-07-31 MED ORDER — FAMOTIDINE IN NACL 20-0.9 MG/50ML-% IV SOLN
20.0000 mg | Freq: Once | INTRAVENOUS | Status: AC
  Administered 2023-07-31: 20 mg via INTRAVENOUS
  Filled 2023-07-31: qty 50

## 2023-07-31 MED ORDER — SODIUM CHLORIDE 0.9 % IV BOLUS
1000.0000 mL | Freq: Once | INTRAVENOUS | Status: AC
  Administered 2023-07-31: 1000 mL via INTRAVENOUS

## 2023-07-31 NOTE — ED Triage Notes (Signed)
 Pt took prescribed 5mg  oxycodone  around 0600. Has been taking regularly q6 and still having pain

## 2023-07-31 NOTE — Progress Notes (Signed)
 Pt arrived with BP of 85/53, C/O several weeks of poor oral intake with constant nausea and vomiting, and occasional diarrhea. 1L NS started open and pt trasferred to ED with help of provider Mallie Combes, PA.

## 2023-07-31 NOTE — Progress Notes (Signed)
 Symptom Management Consult Note [facility]    Patient Care Team: Maree Leni Edyth DELENA, MD as PCP - General (Family Medicine) O'Neal, Darryle Ned, MD as PCP - Cardiology (Cardiology) Prentis Duwaine BROCKS, RN as Oncology Nurse Navigator    Name / MRN / DOB: Shelia Chan  [id number]  07-Sep-1955   Date of visit: 07/31/2023   Chief Complaint/Reason for visit: hypotension     ASSESSMENT AND PLAN Patient is a 68 y.o. 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 Sherrod.  I have viewed most recent oncology note and lab work.  #Extensive stage small cell - Here for first time treatment. Do not receive due to hypotension and abnormal labs - Next appointment with oncologist is 08/08/23  #Hypotension - Patient found to be hypotensive on arrival. 84/57, started on 1L NS. Also has RUQ abdominal pain and 2+ pitting BLE. 20 mg IV pepcid  given per abdominal discomfort - Labs concerning for acute hyponatremia,acute kidney failure, and elevated AST+ T. bili Kidney failure acute. Creat 6.35 today, was 3.36 x 11 days ago - Patient will need emergent work up given these abnormalities. She was transferred to ED and report given to accepting RN. Oncologist updated as well.     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     Serrina Shirin Chan is a 68 y.o. female with oncologic history as above presenting [facility] today with chief complaint of hypotension. Patient presents unaccompanied to visit today.   Patient found to be hypotensive on arrival by RN. She admits to feeling light headed intermittently  x 1 week. She has also been experiencing nausea and vomiting x 2 weeks, reports over 5 episodes of emesis yesterday.  She took zofran  8 mg at 6 am this morning. She is also having RUQ pain because liver also has cancer and took her oxycodone  this morning. Pain continues and is 8/10 in severity. She denies fevers. Had falls x 3 weeks ago, non recently. She has not been taking her lasix  because she thought her kidneys were bad.       ROS  All other systems are reviewed and are negative for acute change except as noted in the HPI.    Allergies  Allergen Reactions   Metoprolol  Shortness Of Breath   Shellfish-Derived Products Other (See Comments)    MD said to NOT eat this due to the urostomy   Tape Other (See Comments)    CLEAR, PLASTIC, HOSPITAL TAPE PULLS OFF THE SKIN!!!     Past Medical History:  Diagnosis Date   Arthritis    Asthma    Blood transfusion without reported diagnosis    Cancer (HCC)    CHF (congestive heart failure) (HCC)    COPD (chronic obstructive pulmonary disease) (HCC)  Heart murmur    Immune deficiency disorder (HCC)    Renal disorder      Past Surgical History:  Procedure Laterality Date   ABDOMINAL HYSTERECTOMY     BLADDER REMOVAL  118/22/2015   FRACTURE SURGERY Left 02/2018   pins   IR CATHETER TUBE CHANGE  04/15/2019   IR CATHETER TUBE CHANGE  04/15/2019   IR EXT NEPHROURETERAL CATH EXCHANGE  07/15/2019   IR EXT NEPHROURETERAL CATH EXCHANGE  10/14/2019   IR EXT NEPHROURETERAL CATH EXCHANGE  10/14/2019   IR EXT NEPHROURETERAL CATH EXCHANGE  01/06/2020   IR EXT NEPHROURETERAL CATH EXCHANGE  03/16/2020   IR EXT NEPHROURETERAL CATH EXCHANGE  05/25/2020    IR EXT NEPHROURETERAL CATH EXCHANGE  07/17/2020   IR EXT NEPHROURETERAL CATH EXCHANGE  07/17/2020   IR EXT NEPHROURETERAL CATH EXCHANGE  08/14/2020   IR EXT NEPHROURETERAL CATH EXCHANGE  09/25/2020   IR EXT NEPHROURETERAL CATH EXCHANGE  11/06/2020   IR EXT NEPHROURETERAL CATH EXCHANGE  12/25/2020   IR EXT NEPHROURETERAL CATH EXCHANGE  02/09/2021   IR EXT NEPHROURETERAL CATH EXCHANGE  03/19/2021   IR EXT NEPHROURETERAL CATH EXCHANGE  03/19/2021   IR EXT NEPHROURETERAL CATH EXCHANGE  04/23/2021   IR EXT NEPHROURETERAL CATH EXCHANGE  04/23/2021   IR EXT NEPHROURETERAL CATH EXCHANGE  06/04/2021   IR EXT NEPHROURETERAL CATH EXCHANGE  07/15/2021   IR EXT NEPHROURETERAL CATH EXCHANGE  09/02/2021   IR EXT NEPHROURETERAL CATH EXCHANGE  10/14/2021   IR EXT NEPHROURETERAL CATH EXCHANGE  11/25/2021   IR EXT NEPHROURETERAL CATH EXCHANGE  01/06/2022   IR EXT NEPHROURETERAL CATH EXCHANGE  02/17/2022   IR EXT NEPHROURETERAL CATH EXCHANGE  03/31/2022   IR EXT NEPHROURETERAL CATH EXCHANGE  05/12/2022   IR EXT NEPHROURETERAL CATH EXCHANGE  06/30/2022   IR EXT NEPHROURETERAL CATH EXCHANGE  08/18/2022   IR EXT NEPHROURETERAL CATH EXCHANGE  10/06/2022   IR EXT NEPHROURETERAL CATH EXCHANGE  11/24/2022   IR EXT NEPHROURETERAL CATH EXCHANGE  02/09/2023   IR EXT NEPHROURETERAL CATH EXCHANGE  03/23/2023   IR EXT NEPHROURETERAL CATH EXCHANGE  04/26/2023   IR EXT NEPHROURETERAL CATH EXCHANGE  06/01/2023   IR EXT NEPHROURETERAL CATH EXCHANGE  07/06/2023   IR IMAGING GUIDED PORT INSERTION  07/28/2023   IR NEPHROSTOMY EXCHANGE LEFT  12/30/2022   IR NEPHROSTOMY EXCHANGE RIGHT  12/30/2022   IR PERC TUN PERIT CATH WO PORT S&I /IMAG  03/25/2021   IR REMOVAL TUN CV CATH W/O FL  06/04/2021    Social History   Socioeconomic History   Marital status: Divorced    Spouse name: Not on file   Number of children: Not on file   Years of education: Not on file   Highest education level: Not on file  Occupational History    Occupation: disabled  Tobacco Use   Smoking status: Some Days    Current packs/day: 0.00    Average packs/day: 1 pack/day for 50.0 years (50.0 ttl pk-yrs)    Types: Cigarettes, E-cigarettes    Start date: 01/20/1969    Last attempt to quit: 01/21/2019    Years since quitting: 4.5   Smokeless tobacco: Never   Tobacco comments:    Quit smoking but vapes occasionally  Vaping Use   Vaping status: Never Used  Substance and Sexual Activity   Alcohol use: Not Currently   Drug use: Yes    Types: Marijuana    Comment: occasional    Sexual activity: Not on file  Other Topics  Concern   Not on file  Social History Narrative   Not on file   Social Drivers of Health   Financial Resource Strain: Not on file  Food Insecurity: No Food Insecurity (07/20/2023)   Hunger Vital Sign    Worried About Running Out of Food in the Last Year: Never true    Ran Out of Food in the Last Year: Never true  Transportation Needs: No Transportation Needs (07/20/2023)   PRAPARE - Administrator, Civil Service (Medical): No    Lack of Transportation (Non-Medical): No  Physical Activity: Not on file  Stress: Not on file  Social Connections: Moderately Integrated (07/02/2023)   Social Connection and Isolation Panel    Frequency of Communication with Friends and Family: More than three times a week    Frequency of Social Gatherings with Friends and Family: Three times a week    Attends Religious Services: More than 4 times per year    Active Member of Clubs or Organizations: Yes    Attends Banker Meetings: More than 4 times per year    Marital Status: Divorced  Intimate Partner Violence: Not At Risk (07/20/2023)   Humiliation, Afraid, Rape, and Kick questionnaire    Fear of Current or Ex-Partner: No    Emotionally Abused: No    Physically Abused: No    Sexually Abused: No    Family History  Problem Relation Age of Onset   Heart disease Maternal Grandfather      Current Outpatient  Medications:    acetaminophen  (TYLENOL ) 500 MG tablet, Take 500-1,000 mg by mouth every 6 (six) hours as needed for fever or mild pain (pain score 1-3) (or headaches)., Disp: , Rfl:    albuterol  (VENTOLIN  HFA) 108 (90 Base) MCG/ACT inhaler, Inhale 1 puff into the lungs every 6 (six) hours as needed for shortness of breath., Disp: , Rfl:    ARIPiprazole  (ABILIFY ) 30 MG tablet, Take 30 mg by mouth daily., Disp: , Rfl:    aspirin  EC 81 MG tablet, Take 81 mg by mouth daily., Disp: , Rfl:    atorvastatin  (LIPITOR) 40 MG tablet, Take 1 tablet daily at bedtime., Disp: 90 tablet, Rfl: 3   calcitRIOL  (ROCALTROL ) 0.25 MCG capsule, Take 0.25 mcg by mouth daily., Disp: , Rfl:    cyanocobalamin  (VITAMIN B12) 1000 MCG/ML injection, Daily for 6 days, then weekly for 4 times, then monthly, Disp: 12 mL, Rfl: 0   denosumab  (PROLIA ) 60 MG/ML SOSY injection, Inject 60 mg [place] every 6 months, Disp: 1 mL, Rfl: 2   furosemide  (LASIX ) 40 MG tablet, Take 1 tablet (40 mg total) by mouth daily., Disp: , Rfl:    lidocaine -prilocaine  (EMLA ) cream, Apply 1 Application topically as needed., Disp: 30 g, Rfl: 2   ondansetron  (ZOFRAN -ODT) 8 MG disintegrating tablet, Take 1 tablet (8 mg total) by mouth every 8 (eight) hours as needed for nausea or vomiting. Starting day 3 after chemo, Disp: 30 tablet, Rfl: 2   oxyCODONE  (OXY IR/ROXICODONE ) 5 MG immediate release tablet, Take 1 tablet (5 mg total) by mouth every 6 (six) hours as needed for severe pain (Patient taking differently: Take 10 mg by mouth every 6 (six) hours as needed.), Disp: 120 tablet, Rfl: 0   oxyCODONE  (OXY IR/ROXICODONE ) 5 MG immediate release tablet, take 1 tablet (5 mg) by oral route every 6 hours as needed for severe pain (Patient not taking: Reported on 07/02/2023), Disp: 120 tablet, Rfl: 0   oxyCODONE  (OXY IR/ROXICODONE ) 5  MG immediate release tablet, Take 1 tablet (5 mg total) by mouth every 6 (six) hours as needed for severe pain (Patient not taking: Reported on  07/02/2023), Disp: 120 tablet, Rfl: 0   oxyCODONE  (OXY IR/ROXICODONE ) 5 MG immediate release tablet, Take 1 tablet (5 mg total) by mouth every 6 (six) hours as needed for severe pain, Disp: 120 tablet, Rfl: 0   oxyCODONE  (OXY IR/ROXICODONE ) 5 MG immediate release tablet, Take 1 tablet (5 mg total) by mouth every 6 (six) hours as needed for severe pain, Disp: 120 tablet, Rfl: 0   Oxycodone  HCl 10 MG TABS, Take 1 tablet by mouth every 8 hours as needed for pain, Disp: 90 tablet, Rfl: 0   potassium chloride  (KLOR-CON ) 20 MEQ packet, Take 20 mEq by mouth daily as needed (for cramping in the body)., Disp: , Rfl:    prochlorperazine  (COMPAZINE ) 10 MG tablet, Take 1 tablet (10 mg total) by mouth every 6 (six) hours as needed., Disp: 30 tablet, Rfl: 2   promethazine  (PHENERGAN ) 12.5 MG tablet, Take 1 tablet by mouth every 8 hours as needed for nausea, Disp: 30 tablet, Rfl: 0  PHYSICAL EXAM ECOG FS:2 - Symptomatic, <50% confined to bed    Vitals:   07/31/23 1237  BP: (!) 84/51  Pulse: 95  Resp: 18  Temp: 97.9 F (36.6 C)  TempSrc: Axillary  SpO2: 100%   Physical Exam Vitals and nursing note reviewed.  Constitutional:      Appearance: She is ill-appearing. She is not toxic-appearing.     Comments: Frail, thin female  HENT:     Head: Normocephalic.  Eyes:     Conjunctiva/sclera: Conjunctivae normal.  Cardiovascular:     Rate and Rhythm: Normal rate and regular rhythm.     Pulses: Normal pulses.     Heart sounds: Normal heart sounds.  Pulmonary:     Effort: Pulmonary effort is normal.     Breath sounds: Normal breath sounds.  Abdominal:     General: There is distension.     Tenderness: There is abdominal tenderness (RUQ).     Comments:  nephrostomy tubes in place  Musculoskeletal:     Cervical back: Normal range of motion.     Right lower leg: 2+ Pitting Edema present.     Left lower leg: 2+ Pitting Edema present.  Skin:    General: Skin is warm and dry.  Neurological:     Mental  Status: She is alert.        LABORATORY DATA I have reviewed the data as listed    Latest Ref Rng & Units 07/31/2023   11:52 AM 07/20/2023    9:30 AM 07/17/2023    9:53 PM  CBC  WBC 4.0 - 10.5 K/uL 13.3  10.3  13.4   Hemoglobin 12.0 - 15.0 g/dL 9.7  87.5  89.1   Hematocrit 36.0 - 46.0 % 28.1  37.0  32.4   Platelets 150 - 400 K/uL 321  568  539         Latest Ref Rng & Units 07/31/2023   11:52 AM 07/20/2023    9:30 AM 07/17/2023   11:41 PM  CMP  Glucose 70 - 99 mg/dL 898  890  891   BUN 8 - 23 mg/dL 43  21  23   Creatinine 0.44 - 1.00 mg/dL 3.64  6.63  6.96   Sodium 135 - 145 mmol/L 128  135  136   Potassium 3.5 - 5.1 mmol/L 4.9  4.2  4.7   Chloride 98 - 111 mmol/L 99  101  105   CO2 22 - 32 mmol/L 15  22  18    Calcium  8.9 - 10.3 mg/dL 8.4  89.8  9.1   Total Protein 6.5 - 8.1 g/dL 6.5  8.2  6.1   Total Bilirubin 0.0 - 1.2 mg/dL 1.3  0.5  1.0   Alkaline Phos 38 - 126 U/L 285  272  129   AST 15 - 41 U/L 90  53  38   ALT 0 - 44 U/L 9  37  26        RADIOGRAPHIC STUDIES (from last 24 hours if applicable) I have personally reviewed the radiological images as listed and agreed with the findings in the report. No results found.      Visit Diagnosis: 1. Hyponatremia   2. Hypotension, unspecified hypotension type   3. Malignant neoplasm of unspecified part of unspecified bronchus or lung (HCC)   4. Acute renal failure, unspecified acute renal failure type (HCC)      No orders of the defined types were placed in this encounter.   All questions were answered. The patient knows to call the clinic with any problems, questions or concerns. No barriers to learning was detected.  A total of more than 40 minutes were spent on this encounter with face-to-face time and non-face-to-face time, including preparing to see the patient, ordering tests and/or medications, counseling the patient and coordination of care as outlined above.    Thank you for allowing me to participate in  the care of this patient.    Ifeanyi Mickelson E  Walisiewicz, PA-C Department of Hematology/Oncology [facility] at [facility] Phone: [phone]  [phone]    07/31/2023 2:04 PM

## 2023-07-31 NOTE — Telephone Encounter (Signed)
 [employer] 2021-05 - Specimen Collection Study to Evaluate Biomarkers in Subjects with Cancer    Shelia Chan was called this morning regarding the above study, following confirmation from the Pathology Lab that pt has enough tissue for the study. Pt is scheduled to receive infusion treatment today, which would make her ineligible for the study after treatment initiation. Pt was thanked for her time and for her willingness to contribute to the study.   During the call, pt stated she was not feeling well. Her daughter, who was in the background, mentioned that Shelia Chan' bp was 80, and expressed concern. Pt inquired about the time and location of her upcoming appt. This CRC provided the requested information.   Following the call, the CRC consulted with Harlene Curly, RN. Together,  they spoke with Cassandra Heilingoetter, PA and Dr. Sherrod, MD.  Dr. Sherrod advised that the pt's vital signs would be assessed upon her arrival and did not recommend pursing a separate symptom management visit for her at this time.   Freedom Peddy, Ph.D. Clinical Research Coordinator [phone] 07/31/2023 11:35AM

## 2023-07-31 NOTE — H&P (Signed)
 History and Physical    Patient: Shelia Chan FMW:[id number] DOB: Jul 08, 1955 DOA: 07/31/2023 DOS: the patient was seen and examined on 07/31/2023 PCP: Maree Leni Edyth DELENA, MD  Patient coming from: Home  Chief Complaint:  Chief Complaint  Patient presents with   Hypotension   HPI: Shelia Chan is a 68 y.o. female with medical history significant of arthritis, asthma, diastolic CHF, COPD, unspecified immune deficiency disorder, stage IV CKD, neuroendocrine carcinoma of the lung, urethral carcinoma status post cystectomy  who was sent from the cancer center due to hypotension.  The patient was supposed to undergo her first chemotherapy today but this was canceled.  She has been vomiting for about a week and has frequent dry heaving and nausea.  She is at times able to tolerate small sips and some of her medications, but has been unable to eat anything solid.   No diarrhea, constipation, melena or hematochezia.  No flank pain, dysuria, frequency or hematuria. She denied fever, chills, rhinorrhea, sore throat, wheezing or hemoptysis.  Positive pleuritic chest pain, dizziness and palpitations, but no diaphoresis, PND, orthopnea.  She has stage I lymphedema with trace pitting edema of the lower extremities.  No polyuria, polydipsia, polyphagia or blurred vision.   Lab work: CBC showed white count 13.3 with 88% neutrophils, hemoglobin 9.7 g/dL and platelets 678.  CMP showed a sodium 128, potassium 4.9, chloride 99 and CO2 15 mmol/L with a normal anion gap of 14.  Glucose 101, BUN 43 and creatinine 6.35 mg/dL.  11 days ago BUN was 21 and creatinine 3.36.  Several months ago her creatinine was below 2.5 mg/dL on most measurements.  Magnesium  is normal after correction.  Total protein 6.5 and albumin  3.4 g/dL.  ALT was 9, AST was 90, alkaline phosphatase 285 units/L.  Total bilirubin was 1.3 mg/dL.  Imaging: CT abdomen without contrast showed substantial persistent hepatomegaly with scattered  mostly hypodense lesions throughout all lobes of the liver corresponding to previously identified hypermetabolic hepatic metastasis.  The right hepatic lobe margin extends down to the anatomic pelvis and beyond the inferior margin on today's noncontrast CT exam.  No hypermetabolic retroperitoneal and porta hepatis lymph nodes.  Mild right and prominent left renal atrophy.  We did total stents in both collecting system and the stenting in the proximal ureters, the exit of the stents via ileal conduit is partially included.  No nauseous metastatic disease particularly in the lumbar spine which is not readily apparent on the CT of today's exam, but which may be visible on the PET CAT scan from last week.  Cylindrical bronchiectasis in both lower lobes with mild scarring or atelectasis in the posterior basal segments of both lower lobes.  Mild diffuse regional subcutaneous edema.  Aortic atherosclerosis.   ED course: Initial vital signs were temperature 97.9 F, pulse 90, respiration 20, BP 117/24 mmHg O2 sat 100% on room air.  The patient received hydromorphone  0.5 mg IVP, ondansetron  4 mg IVP and 1000 mL of LR bolus.  Review of Systems: As mentioned in the history of present illness. All other systems reviewed and are negative. Past Medical History:  Diagnosis Date   Arthritis    Asthma    Blood transfusion without reported diagnosis    Cancer (HCC)    CHF (congestive heart failure) (HCC)    COPD (chronic obstructive pulmonary disease) (HCC)    Heart murmur    Immune deficiency disorder (HCC)    Renal disorder    Past Surgical History:  Procedure Laterality Date   ABDOMINAL HYSTERECTOMY     BLADDER REMOVAL  118/22/2015   FRACTURE SURGERY Left 02/2018   pins   IR CATHETER TUBE CHANGE  04/15/2019   IR CATHETER TUBE CHANGE  04/15/2019   IR EXT NEPHROURETERAL CATH EXCHANGE  07/15/2019   IR EXT NEPHROURETERAL CATH EXCHANGE  10/14/2019   IR EXT NEPHROURETERAL CATH EXCHANGE  10/14/2019   IR EXT  NEPHROURETERAL CATH EXCHANGE  01/06/2020   IR EXT NEPHROURETERAL CATH EXCHANGE  03/16/2020   IR EXT NEPHROURETERAL CATH EXCHANGE  05/25/2020   IR EXT NEPHROURETERAL CATH EXCHANGE  07/17/2020   IR EXT NEPHROURETERAL CATH EXCHANGE  07/17/2020   IR EXT NEPHROURETERAL CATH EXCHANGE  08/14/2020   IR EXT NEPHROURETERAL CATH EXCHANGE  09/25/2020   IR EXT NEPHROURETERAL CATH EXCHANGE  11/06/2020   IR EXT NEPHROURETERAL CATH EXCHANGE  12/25/2020   IR EXT NEPHROURETERAL CATH EXCHANGE  02/09/2021   IR EXT NEPHROURETERAL CATH EXCHANGE  03/19/2021   IR EXT NEPHROURETERAL CATH EXCHANGE  03/19/2021   IR EXT NEPHROURETERAL CATH EXCHANGE  04/23/2021   IR EXT NEPHROURETERAL CATH EXCHANGE  04/23/2021   IR EXT NEPHROURETERAL CATH EXCHANGE  06/04/2021   IR EXT NEPHROURETERAL CATH EXCHANGE  07/15/2021   IR EXT NEPHROURETERAL CATH EXCHANGE  09/02/2021   IR EXT NEPHROURETERAL CATH EXCHANGE  10/14/2021   IR EXT NEPHROURETERAL CATH EXCHANGE  11/25/2021   IR EXT NEPHROURETERAL CATH EXCHANGE  01/06/2022   IR EXT NEPHROURETERAL CATH EXCHANGE  02/17/2022   IR EXT NEPHROURETERAL CATH EXCHANGE  03/31/2022   IR EXT NEPHROURETERAL CATH EXCHANGE  05/12/2022   IR EXT NEPHROURETERAL CATH EXCHANGE  06/30/2022   IR EXT NEPHROURETERAL CATH EXCHANGE  08/18/2022   IR EXT NEPHROURETERAL CATH EXCHANGE  10/06/2022   IR EXT NEPHROURETERAL CATH EXCHANGE  11/24/2022   IR EXT NEPHROURETERAL CATH EXCHANGE  02/09/2023   IR EXT NEPHROURETERAL CATH EXCHANGE  03/23/2023   IR EXT NEPHROURETERAL CATH EXCHANGE  04/26/2023   IR EXT NEPHROURETERAL CATH EXCHANGE  06/01/2023   IR EXT NEPHROURETERAL CATH EXCHANGE  07/06/2023   IR IMAGING GUIDED PORT INSERTION  07/28/2023   IR NEPHROSTOMY EXCHANGE LEFT  12/30/2022   IR NEPHROSTOMY EXCHANGE RIGHT  12/30/2022   IR PERC TUN PERIT CATH WO PORT S&I /IMAG  03/25/2021   IR REMOVAL TUN CV CATH W/O FL  06/04/2021   Social History:  reports that she has been smoking cigarettes and e-cigarettes. She started  smoking about 54 years ago. She has a 50 pack-year smoking history. She has never used smokeless tobacco. She reports that she does not currently use alcohol. She reports current drug use. Drug: Marijuana.  Allergies  Allergen Reactions   Metoprolol  Shortness Of Breath   Shellfish-Derived Products Other (See Comments)    MD said to NOT eat this due to the urostomy   Tape Other (See Comments)    CLEAR, PLASTIC, HOSPITAL TAPE PULLS OFF THE SKIN!!!    Family History  Problem Relation Age of Onset   Heart disease Maternal Grandfather     Prior to Admission medications   Medication Sig Start Date End Date Taking? Authorizing Provider  acetaminophen  (TYLENOL ) 500 MG tablet Take 500-1,000 mg by mouth every 6 (six) hours as needed for fever or mild pain (pain score 1-3) (or headaches).    [provider]  albuterol  (VENTOLIN  HFA) 108 (90 Base) MCG/ACT inhaler Inhale 1 puff into the lungs every 6 (six) hours as needed for shortness of breath. 05/22/20  [provider]  ARIPiprazole  (ABILIFY ) 30 MG tablet Take 30 mg by mouth daily. 03/19/19   [provider]  aspirin  EC 81 MG tablet Take 81 mg by mouth daily.    [provider]  atorvastatin  (LIPITOR) 40 MG tablet Take 1 tablet daily at bedtime. 03/17/22     calcitRIOL  (ROCALTROL ) 0.25 MCG capsule Take 0.25 mcg by mouth daily. 02/12/21   [provider]  cyanocobalamin  (VITAMIN B12) 1000 MCG/ML injection Daily for 6 days, then weekly for 4 times, then monthly 07/13/23   Tina [name] BROCKS, MD  denosumab  (PROLIA ) 60 MG/ML SOSY injection Inject 60 mg [place] every 6 months 05/23/22     furosemide  (LASIX ) 40 MG tablet Take 1 tablet (40 mg total) by mouth daily. 07/09/23   Vernon Ranks, MD  lidocaine -prilocaine  (EMLA ) cream Apply 1 Application topically as needed. 07/20/23   Heilingoetter, Cassandra L, PA-C  ondansetron  (ZOFRAN -ODT) 8 MG disintegrating tablet Take 1 tablet (8 mg total) by mouth every 8 (eight) hours as  needed for nausea or vomiting. Starting day 3 after chemo 07/20/23   Heilingoetter, Cassandra L, PA-C  oxyCODONE  (OXY IR/ROXICODONE ) 5 MG immediate release tablet Take 1 tablet (5 mg total) by mouth every 6 (six) hours as needed for severe pain Patient taking differently: Take 10 mg by mouth every 6 (six) hours as needed. 12/02/22     oxyCODONE  (OXY IR/ROXICODONE ) 5 MG immediate release tablet take 1 tablet (5 mg) by oral route every 6 hours as needed for severe pain Patient not taking: Reported on 07/02/2023 01/02/23     oxyCODONE  (OXY IR/ROXICODONE ) 5 MG immediate release tablet Take 1 tablet (5 mg total) by mouth every 6 (six) hours as needed for severe pain Patient not taking: Reported on 07/02/2023 05/01/23     oxyCODONE  (OXY IR/ROXICODONE ) 5 MG immediate release tablet Take 1 tablet (5 mg total) by mouth every 6 (six) hours as needed for severe pain 06/01/23     oxyCODONE  (OXY IR/ROXICODONE ) 5 MG immediate release tablet Take 1 tablet (5 mg total) by mouth every 6 (six) hours as needed for severe pain 07/10/23     Oxycodone  HCl 10 MG TABS Take 1 tablet by mouth every 8 hours as needed for pain 07/27/23     potassium chloride  (KLOR-CON ) 20 MEQ packet Take 20 mEq by mouth daily as needed (for cramping in the body).    [provider]  prochlorperazine  (COMPAZINE ) 10 MG tablet Take 1 tablet (10 mg total) by mouth every 6 (six) hours as needed. 07/20/23   Heilingoetter, Cassandra L, PA-C  promethazine  (PHENERGAN ) 12.5 MG tablet Take 1 tablet by mouth every 8 hours as needed for nausea 07/12/23       Physical Exam: Vitals:   07/31/23 1332 07/31/23 1430  BP: (!) 117/94 (!) 97/58  Pulse: 90 92  Resp: 20   Temp: 97.9 F (36.6 C)   TempSrc: Oral   SpO2: 100% 97%   Physical Exam Vitals and nursing note reviewed.  Constitutional:      General: She is awake. She is not in acute distress.    Appearance: Normal appearance. She is ill-appearing.  HENT:     Head: Normocephalic.     Nose: No  rhinorrhea.     Mouth/Throat:     Mouth: Mucous membranes are dry.  Eyes:     General: No scleral icterus.    Pupils: Pupils are equal, round, and reactive to light.  Neck:     Vascular: No  JVD.  Cardiovascular:     Rate and Rhythm: Normal rate and regular rhythm.     Heart sounds: S1 normal and S2 normal.  Pulmonary:     Breath sounds: No wheezing, rhonchi or rales.  Abdominal:     General: Bowel sounds are normal. There is distension.     Palpations: Abdomen is soft.     Tenderness: There is abdominal tenderness. There is no right CVA tenderness, left CVA tenderness or guarding.  Musculoskeletal:     Cervical back: Neck supple.     Right lower leg: 1+ Pitting Edema present.     Left lower leg: 1+ Pitting Edema present.  Skin:    General: Skin is warm and dry.  Neurological:     General: No focal deficit present.     Mental Status: She is alert and oriented to person, place, and time.  Psychiatric:        Mood and Affect: Mood normal.        Behavior: Behavior normal. Behavior is cooperative.     Data Reviewed:  Results are pending, will review when available.  03/18/2021 echocardiogram report. IMPRESSIONS:   1. Left ventricular ejection fraction, by estimation, is 65 to 70%. Left  ventricular ejection fraction by 2D MOD biplane is 68.2 %. The left  ventricle has normal function. The left ventricle has no regional wall  motion abnormalities. There is mild left  ventricular hypertrophy. Left ventricular diastolic parameters are  consistent with Grade I diastolic dysfunction (impaired relaxation).   2. Right ventricular systolic function is normal. The right ventricular  size is normal. There is normal pulmonary artery systolic pressure. The  estimated right ventricular systolic pressure is 34.2 mmHg.   3. The mitral valve is grossly normal. Trivial mitral valve  regurgitation.   4. The aortic valve is abnormal. There is moderate thickening of the  aortic valve.  Aortic valve regurgitation is mild to moderate.   5. The inferior vena cava is dilated in size with <50% respiratory  variability, suggesting right atrial pressure of 15 mmHg.   Comparison(s): Changes from prior study are noted. 06/21/2019: LVEF 60-65%,  mild AI.   Assessment and Plan: Principal Problem:   Intractable nausea and vomiting Leading to:   AKI (acute kidney injury) (HCC) Superimposed on:   CKD (chronic kidney disease), stage IV (HCC) Observation/PCU. Continue IV fluids. Hold diuretic. Avoid hypotension. Avoid nephrotoxins. Monitor intake and output. Monitor renal function electrolytes.  Active Problems:   Asthma, chronic  Bronchodilators as needed.    Chronic diastolic CHF (congestive heart failure) (HCC) Patient is volume depleted.    Depression with anxiety Continue Abilify  in AM.    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 Analgesics as needed.    Advance Care Planning:   Code Status: Limited: Do not attempt resuscitation (DNR) -DNR-LIMITED -Do Not Intubate/DNI    Consults:   Family Communication:   Severity of Illness: The appropriate patient status for this patient is OBSERVATION. Observation status is judged to be reasonable and necessary in order to provide the required intensity of service to ensure the patient's safety. The patient's presenting symptoms, physical exam findings, and initial radiographic and laboratory data in the context of their medical condition is felt to place them at decreased risk for further clinical deterioration. Furthermore, it is anticipated that the patient will be medically stable for discharge from  the hospital within 2 midnights of admission.   Author: Alm Dorn Castor, MD 07/31/2023 4:25 PM  For on call review [email or web address].   This document was prepared using Dragon voice recognition  software and may contain some unintended transcription errors.

## 2023-07-31 NOTE — ED Triage Notes (Signed)
 Pt arrives from cancer center for hypotension. Was supposed to get first tx today. 20mg  pepcid . Labs show creatinine increased from 3 to 6 in 11 days, poor PO intake. Hx chf, has not been taking lasix  d/t suspected kidney issues when abd pain started last Thursday. +2 pitting edema.    84/51 on arrival 94/57 after ~500cc ns Hr 90s

## 2023-07-31 NOTE — ED Provider Notes (Signed)
 [place] EMERGENCY DEPARTMENT AT [facility] Provider Note   CSN: [id number] Arrival date & time: 07/31/23  1320     Patient presents with: Hypotension   Shelia Chan is a 68 y.o. female.  With a history of neuroendocrine carcinoma of the lung, urethral carcinoma status post cystectomy, heart failure and COPD who presents to the ED given concern for hypotension.  Patient reports 2 weeks of ongoing nausea and vomiting.  Took Zofran  at home today.  She is currently followed by Dr. Sherrod here (hematology oncology) and is scheduled to begin treatment with Durvalumab + Carboplatin + Etoposide but has not yet started chemotherapy.  She has experienced abdominal pain distention nausea and vomiting for the last 2 weeks and was sent here from the cancer center for further evaluation of these issues and hypotension.   HPI     Prior to Admission medications   Medication Sig Start Date End Date Taking? Authorizing Provider  acetaminophen  (TYLENOL ) 500 MG tablet Take 500-1,000 mg by mouth every 6 (six) hours as needed for fever or mild pain (pain score 1-3) (or headaches).    [provider]  albuterol  (VENTOLIN  HFA) 108 (90 Base) MCG/ACT inhaler Inhale 1 puff into the lungs every 6 (six) hours as needed for shortness of breath. 05/22/20   [provider]  ARIPiprazole  (ABILIFY ) 30 MG tablet Take 30 mg by mouth daily. 03/19/19   [provider]  aspirin  EC 81 MG tablet Take 81 mg by mouth daily.    [provider]  atorvastatin  (LIPITOR) 40 MG tablet Take 1 tablet daily at bedtime. 03/17/22     calcitRIOL  (ROCALTROL ) 0.25 MCG capsule Take 0.25 mcg by mouth daily. 02/12/21   [provider]  cyanocobalamin  (VITAMIN B12) 1000 MCG/ML injection Daily for 6 days, then weekly for 4 times, then monthly 07/13/23   Tina [name] BROCKS, MD  denosumab  (PROLIA ) 60 MG/ML SOSY injection Inject 60 mg [place] every 6 months 05/23/22     furosemide  (LASIX ) 40 MG  tablet Take 1 tablet (40 mg total) by mouth daily. 07/09/23   Vernon Ranks, MD  lidocaine -prilocaine  (EMLA ) cream Apply 1 Application topically as needed. 07/20/23   Heilingoetter, Cassandra L, PA-C  ondansetron  (ZOFRAN -ODT) 8 MG disintegrating tablet Take 1 tablet (8 mg total) by mouth every 8 (eight) hours as needed for nausea or vomiting. Starting day 3 after chemo 07/20/23   Heilingoetter, Cassandra L, PA-C  oxyCODONE  (OXY IR/ROXICODONE ) 5 MG immediate release tablet Take 1 tablet (5 mg total) by mouth every 6 (six) hours as needed for severe pain Patient taking differently: Take 10 mg by mouth every 6 (six) hours as needed. 12/02/22     oxyCODONE  (OXY IR/ROXICODONE ) 5 MG immediate release tablet take 1 tablet (5 mg) by oral route every 6 hours as needed for severe pain Patient not taking: Reported on 07/02/2023 01/02/23     oxyCODONE  (OXY IR/ROXICODONE ) 5 MG immediate release tablet Take 1 tablet (5 mg total) by mouth every 6 (six) hours as needed for severe pain Patient not taking: Reported on 07/02/2023 05/01/23     oxyCODONE  (OXY IR/ROXICODONE ) 5 MG immediate release tablet Take 1 tablet (5 mg total) by mouth every 6 (six) hours as needed for severe pain 06/01/23     oxyCODONE  (OXY IR/ROXICODONE ) 5 MG immediate release tablet Take 1 tablet (5 mg total) by mouth every 6 (six) hours as needed for severe pain 07/10/23     Oxycodone  HCl 10 MG TABS Take  1 tablet by mouth every 8 hours as needed for pain 07/27/23     potassium chloride  (KLOR-CON ) 20 MEQ packet Take 20 mEq by mouth daily as needed (for cramping in the body).    [provider]  prochlorperazine  (COMPAZINE ) 10 MG tablet Take 1 tablet (10 mg total) by mouth every 6 (six) hours as needed. 07/20/23   Heilingoetter, Cassandra L, PA-C  promethazine  (PHENERGAN ) 12.5 MG tablet Take 1 tablet by mouth every 8 hours as needed for nausea 07/12/23       Allergies: Metoprolol , Shellfish-derived products, and Tape    Review of Systems  Updated  Vital Signs BP (!) 97/58   Pulse 92   Temp 97.9 F (36.6 C) (Oral)   Resp 20   SpO2 97%   Physical Exam Vitals and nursing note reviewed.  HENT:     Head: Normocephalic and atraumatic.  Eyes:     Pupils: Pupils are equal, round, and reactive to light.  Cardiovascular:     Rate and Rhythm: Normal rate and regular rhythm.  Pulmonary:     Effort: Pulmonary effort is normal.     Breath sounds: Normal breath sounds.  Abdominal:     General: There is distension.     Palpations: Abdomen is soft.     Tenderness: There is no abdominal tenderness. There is no guarding or rebound.     Comments: Firm distended abdomen with nephrostomy tubes in place draining clear yellow urine into culture back  Musculoskeletal:     Right lower leg: Edema present.     Left lower leg: Edema present.     Comments: Right chest port in place  Skin:    General: Skin is warm and dry.  Neurological:     Mental Status: She is alert.  Psychiatric:        Mood and Affect: Mood normal.     (all labs ordered are listed, but only abnormal results are displayed) Labs Reviewed - No data to display  EKG: None  Radiology: CT ABDOMEN WO CONTRAST Result Date: 07/31/2023 CLINICAL DATA:  Abdominal pain and distension. Small cell lung cancer. 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 * Tracking Code: BO * EXAM: CT ABDOMEN WITHOUT CONTRAST TECHNIQUE: Multidetector CT imaging of the abdomen was performed following the standard protocol without IV contrast. RADIATION DOSE REDUCTION: This exam was performed according to the departmental dose-optimization program which includes automated exposure control, adjustment of the mA and/or kV according to patient size and/or use of iterative reconstruction technique. COMPARISON:  Multiple exams, including PET-CT 07/24/2023 FINDINGS: Lower chest: Cylindrical bronchiectasis in both lower lobes with mild scarring or atelectasis in the  posterior basal segments of both lower lobes. Descending thoracic aortic atherosclerosis. Hepatobiliary: Substantial persistent hepatomegaly with scattered mostly hypodense lesions throughout all lobes of the liver corresponding to previously identified hypermetabolic hepatic metastases. The right hepatic lobe margin extends down into the anatomic pelvis and beyond the inferior margin of today's noncontrast CT abdomen exam. Pancreas: Indistinct tissue planes around the pancreatic body and head in the vicinity of the known porta hepatis adenopathy. Spleen: Unremarkable Adrenals/Urinary Tract: Mild right and prominent left renal atrophy. Ureteral stents in both collecting systems and extending in the proximal ureters, the exit of the stents via ileal conduit is partially included. Adrenal glands unremarkable. Stomach/Bowel: Unremarkable where included. Vascular/Lymphatic: Atherosclerosis is present, including aortoiliac atherosclerotic disease. Known hypermetabolic retroperitoneal and porta hepatis lymph nodes. Other: Mild diffuse regional subcutaneous edema. Musculoskeletal:  Rib deformities compatible with old left lower rib fractures. The patient has known osseous metastatic disease particularly in the lumbar spine which is not readily apparent on the CT data of today's exam but which was visible on the PET-CT from 1 week ago. Chronic deformity of the sacral ala related to old fractures. IMPRESSION: 1. Substantial persistent hepatomegaly with scattered mostly hypodense lesions throughout all lobes of the liver corresponding to previously identified hypermetabolic hepatic metastases. The right hepatic lobe margin extends down into the anatomic pelvis and beyond the inferior margin of today's noncontrast CT abdomen exam. 2. Known hypermetabolic retroperitoneal and porta hepatis lymph nodes. 3. Mild right and prominent left renal atrophy. Ureteral stents in both collecting systems and extending in the proximal ureters,  the exit of the stents via ileal conduit is partially included. 4. The patient has known osseous metastatic disease particularly in the lumbar spine which is not readily apparent on the CT data of today's exam but which was visible on the PET-CT from 1 week ago. 5. Cylindrical bronchiectasis in both lower lobes with mild scarring or atelectasis in the posterior basal segments of both lower lobes. 6. Mild diffuse regional subcutaneous edema. 7.  Aortic Atherosclerosis (ICD10-I70.0). Electronically Signed   By: Ryan Salvage M.D.   On: 07/31/2023 15:50     Procedures   Medications Ordered in the ED  ondansetron  (ZOFRAN ) injection 4 mg (4 mg Intravenous Given 07/31/23 1619)  HYDROmorphone  (DILAUDID ) injection 0.5 mg (0.5 mg Intravenous Given 07/31/23 1626)  lactated ringers  bolus 1,000 mL (1,000 mLs Intravenous New Bag/Given 07/31/23 1628)    Clinical Course as of 07/31/23 1628  Mon Jul 31, 2023  1625 CT abdomen pelvis shows redemonstration of hepatic mets, known metastatic disease, ureteral stents, chronic findings.  No acute process identified.  Considering acute renal failure and deterioration at home over the last 2 weeks will admit patient to hospitalist service [MP]    Clinical Course User Index [MP] Pamella Ozell LABOR, DO                                 Medical Decision Making 69 year old female with history as above presenting from cancer center given concern for hypotension nausea vomiting x 2 weeks.  Has not yet started chemotherapy for neuroendocrine tumor of lung.  Noted to be hypotensive earlier.  Blood pressure improved with IV fluids prior to arrival.  Normotensive on my assessment.  Afebrile.  Exam notable for abdominal distention with firm abdomen.  Status post nephroureteral catheter with collection bag in place.  Presentation concerning for progression of known neuroendocrine tumor and/or squamous cell carcinoma of the urethra.  Labs obtained earlier show hyponatremia and AKI.   Will obtain CT abdomen pelvis without contrast here, provide IV fluids for rehydration and plan for admission  Amount and/or Complexity of Data Reviewed Radiology: ordered.  Risk Prescription drug management. Decision regarding hospitalization.        Final diagnoses:  Acute renal failure, unspecified acute renal failure type [facility])  Neuroendocrine carcinoma of lung (HCC)  History of known metastasis to liver  Hyponatremia    ED Discharge Orders          Ordered    Infusion Appointment Request        07/31/23 1406    Clinic Appointment Request        07/31/23 1406    Lab Appointment Request  07/31/23 1406    CBC with Differential (Cancer Center Only)        07/31/23 1406    CMP (Cancer Center only)        07/31/23 1406    T4        07/31/23 1406    TSH        07/31/23 1406               Pamella Ozell LABOR, DO 07/31/23 1628

## 2023-08-01 ENCOUNTER — Encounter: Payer: Self-pay | Admitting: Internal Medicine

## 2023-08-01 ENCOUNTER — Inpatient Hospital Stay

## 2023-08-01 ENCOUNTER — Encounter (HOSPITAL_COMMUNITY): Payer: Self-pay

## 2023-08-01 DIAGNOSIS — C7B8 Other secondary neuroendocrine tumors: Secondary | ICD-10-CM | POA: Diagnosis present

## 2023-08-01 DIAGNOSIS — F32A Depression, unspecified: Secondary | ICD-10-CM | POA: Diagnosis present

## 2023-08-01 DIAGNOSIS — N170 Acute kidney failure with tubular necrosis: Secondary | ICD-10-CM | POA: Diagnosis present

## 2023-08-01 DIAGNOSIS — J189 Pneumonia, unspecified organism: Secondary | ICD-10-CM | POA: Diagnosis not present

## 2023-08-01 DIAGNOSIS — Z66 Do not resuscitate: Secondary | ICD-10-CM | POA: Diagnosis present

## 2023-08-01 DIAGNOSIS — J441 Chronic obstructive pulmonary disease with (acute) exacerbation: Secondary | ICD-10-CM | POA: Diagnosis not present

## 2023-08-01 DIAGNOSIS — E871 Hypo-osmolality and hyponatremia: Secondary | ICD-10-CM | POA: Diagnosis present

## 2023-08-01 DIAGNOSIS — Z515 Encounter for palliative care: Secondary | ICD-10-CM | POA: Diagnosis not present

## 2023-08-01 DIAGNOSIS — R6521 Severe sepsis with septic shock: Secondary | ICD-10-CM | POA: Diagnosis present

## 2023-08-01 DIAGNOSIS — E8809 Other disorders of plasma-protein metabolism, not elsewhere classified: Secondary | ICD-10-CM | POA: Diagnosis present

## 2023-08-01 DIAGNOSIS — J449 Chronic obstructive pulmonary disease, unspecified: Secondary | ICD-10-CM | POA: Diagnosis not present

## 2023-08-01 DIAGNOSIS — E872 Acidosis, unspecified: Secondary | ICD-10-CM | POA: Diagnosis present

## 2023-08-01 DIAGNOSIS — D649 Anemia, unspecified: Secondary | ICD-10-CM | POA: Diagnosis present

## 2023-08-01 DIAGNOSIS — R571 Hypovolemic shock: Secondary | ICD-10-CM | POA: Diagnosis present

## 2023-08-01 DIAGNOSIS — I219 Acute myocardial infarction, unspecified: Secondary | ICD-10-CM | POA: Diagnosis not present

## 2023-08-01 DIAGNOSIS — N184 Chronic kidney disease, stage 4 (severe): Secondary | ICD-10-CM | POA: Diagnosis present

## 2023-08-01 DIAGNOSIS — E785 Hyperlipidemia, unspecified: Secondary | ICD-10-CM | POA: Diagnosis present

## 2023-08-01 DIAGNOSIS — A419 Sepsis, unspecified organism: Secondary | ICD-10-CM | POA: Diagnosis not present

## 2023-08-01 DIAGNOSIS — E860 Dehydration: Secondary | ICD-10-CM | POA: Diagnosis present

## 2023-08-01 DIAGNOSIS — J440 Chronic obstructive pulmonary disease with acute lower respiratory infection: Secondary | ICD-10-CM | POA: Diagnosis not present

## 2023-08-01 DIAGNOSIS — N179 Acute kidney failure, unspecified: Secondary | ICD-10-CM | POA: Diagnosis present

## 2023-08-01 DIAGNOSIS — I5032 Chronic diastolic (congestive) heart failure: Secondary | ICD-10-CM | POA: Diagnosis present

## 2023-08-01 DIAGNOSIS — E8729 Other acidosis: Secondary | ICD-10-CM | POA: Diagnosis not present

## 2023-08-01 DIAGNOSIS — C7A1 Malignant poorly differentiated neuroendocrine tumors: Secondary | ICD-10-CM | POA: Diagnosis present

## 2023-08-01 DIAGNOSIS — R579 Shock, unspecified: Secondary | ICD-10-CM | POA: Diagnosis not present

## 2023-08-01 DIAGNOSIS — E538 Deficiency of other specified B group vitamins: Secondary | ICD-10-CM | POA: Diagnosis present

## 2023-08-01 LAB — COMPREHENSIVE METABOLIC PANEL WITH GFR
ALT: 12 U/L (ref 0–44)
AST: 99 U/L — ABNORMAL HIGH (ref 15–41)
Albumin: 2.7 g/dL — ABNORMAL LOW (ref 3.5–5.0)
Alkaline Phosphatase: 244 U/L — ABNORMAL HIGH (ref 38–126)
Anion gap: 17 — ABNORMAL HIGH (ref 5–15)
BUN: 45 mg/dL — ABNORMAL HIGH (ref 8–23)
CO2: 10 mmol/L — ABNORMAL LOW (ref 22–32)
Calcium: 7.5 mg/dL — ABNORMAL LOW (ref 8.9–10.3)
Chloride: 102 mmol/L (ref 98–111)
Creatinine, Ser: 6.28 mg/dL — ABNORMAL HIGH (ref 0.44–1.00)
GFR, Estimated: 7 mL/min — ABNORMAL LOW (ref >60)
Glucose, Bld: 102 mg/dL — ABNORMAL HIGH (ref 70–99)
Potassium: 4.6 mmol/L (ref 3.5–5.1)
Sodium: 129 mmol/L — ABNORMAL LOW (ref 135–145)
Total Bilirubin: 2.1 mg/dL — ABNORMAL HIGH (ref 0.0–1.2)
Total Protein: 5.8 g/dL — ABNORMAL LOW (ref 6.5–8.1)

## 2023-08-01 LAB — CBC
HCT: 25.2 % — ABNORMAL LOW (ref 36.0–46.0)
Hemoglobin: 8.2 g/dL — ABNORMAL LOW (ref 12.0–15.0)
MCH: 30.6 pg (ref 26.0–34.0)
MCHC: 32.5 g/dL (ref 30.0–36.0)
MCV: 94 fL (ref 80.0–100.0)
Platelets: 244 K/uL (ref 150–400)
RBC: 2.68 MIL/uL — ABNORMAL LOW (ref 3.87–5.11)
RDW: 15.9 % — ABNORMAL HIGH (ref 11.5–15.5)
WBC: 10.1 K/uL (ref 4.0–10.5)
nRBC: 0 % (ref 0.0–0.2)

## 2023-08-01 LAB — T4: T4, Total: 8.3 ug/dL (ref 4.5–12.0)

## 2023-08-01 LAB — HIV ANTIBODY (ROUTINE TESTING W REFLEX): HIV Screen 4th Generation wRfx: NONREACTIVE

## 2023-08-01 MED ORDER — ALBUMIN HUMAN 25 % IV SOLN
12.5000 g | Freq: Once | INTRAVENOUS | Status: AC
  Administered 2023-08-01: 12.5 g via INTRAVENOUS
  Filled 2023-08-01: qty 50

## 2023-08-01 MED ORDER — HEPARIN SODIUM (PORCINE) 5000 UNIT/ML IJ SOLN
5000.0000 [IU] | Freq: Two times a day (BID) | INTRAMUSCULAR | Status: DC
  Administered 2023-08-01 – 2023-08-05 (×9): 5000 [IU] via SUBCUTANEOUS
  Filled 2023-08-01 (×9): qty 1

## 2023-08-01 MED ORDER — ONDANSETRON HCL 4 MG PO TABS
4.0000 mg | ORAL_TABLET | Freq: Four times a day (QID) | ORAL | Status: DC
  Administered 2023-08-02: 4 mg via ORAL
  Filled 2023-08-01: qty 1

## 2023-08-01 MED ORDER — ONDANSETRON HCL 4 MG/2ML IJ SOLN
4.0000 mg | Freq: Four times a day (QID) | INTRAMUSCULAR | Status: DC
  Administered 2023-08-01 – 2023-08-02 (×3): 4 mg via INTRAVENOUS
  Filled 2023-08-01 (×3): qty 2

## 2023-08-01 MED ORDER — SODIUM CHLORIDE 0.45 % IV SOLN
INTRAVENOUS | Status: DC
  Filled 2023-08-01 (×4): qty 75

## 2023-08-01 NOTE — Progress Notes (Signed)
   08/01/23 1310  TOC Brief Assessment  Insurance and Status Reviewed  Patient has primary care physician Yes  Home environment has been reviewed Resides in single family home with relatives  Prior level of function: Independent with ADLs at baseline  Prior/Current Home Services No current home services  Social Drivers of Health Review SDOH reviewed no interventions necessary  Readmission risk has been reviewed Yes  Transition of care needs no transition of care needs at this time

## 2023-08-01 NOTE — Plan of Care (Signed)

## 2023-08-01 NOTE — Progress Notes (Signed)
 PROGRESS NOTE  Brittany Dillard Dicarlo  DOB: Mar 19, 1955  PCP: Maree Leni Edyth DELENA, MD FMW:[id number]  DOA: 07/31/2023  LOS: 0 days  Hospital Day: 2  Brief narrative: Shelia Chan is a 68 y.o. female with PMH significant for CHF, COPD, CKD, neuroendocrine cancer of the lung, urethral cancer s/p cystectomy  7/14, patient presented at cancer center for first chemotherapy.  Also complained of several weeks of poor oral intake, constant nausea, vomiting, unable to tolerate any oral intake, occasional diarrhea.  She was noted to have low blood pressure of 85/53.  Chemotherapy was canceled and patient was sent to ED for further evaluation management.  In the ED, blood pressure was in the 90s, no fever, breathing room air Labs showed WBC count of 13.3, hemoglobin 9.7, sodium 128, serum bicarb low at 15, BUN/creatinine 43/6.35 which is worse than 21/3.36 from 10 days ago CT abdomen without contrast showed  -substantial persistent hepatomegaly with scattered mostly hypodense lesions throughout all lobes of the liver corresponding to previously identified hypermetabolic hepatic metastasis.  The right hepatic lobe margin extends down to the anatomic pelvis and beyond the inferior margin of CT exam.   -No hypermetabolic retroperitoneal and porta hepatis lymph nodes.   -Mild right and prominent left renal atrophy.  Ureteral stents in both collecting system and the stenting in the proximal ureters, the exit of the stents via ileal conduit is partially included.   -Known metastatic disease particularly in the lumbar spine which is not readily apparent on the CT -Cylindrical bronchiectasis in both lower lobes with mild scarring or atelectasis in the posterior basal segments of both lower lobes. Mild diffuse regional subcutaneous edema.  Aortic atherosclerosis.  Patient received IV fluid, IV Zofran , IV Dilaudid  Admitted to [facility]  Subjective: Patient was seen and examined this morning.  Pleasant elderly  Caucasian female.  Looks older for her age.  Looks dehydrated.  Family at bedside. Chart reviewed Remains afebrile, blood pressure in 80s and 90s, breathing room air Labs pending this morning.  Assessment and plan: Intractable nausea and vomiting Presented with intractable nausea, vomiting, inability to tolerate oral intake leading to severe dehydration No fever, WC count mildly elevated which is probably reactive No evidence of infection.  Not on antibiotics Continue to monitor symptoms with IV fluid, IV antiemetics. Encourage full liquid diet today with scheduled Zofran . Recent Labs  Lab 07/31/23 1152  WBC 13.3*   AKI on CKD 4 Metabolic acidosis Baseline creatinine  less than 3.  Presented with creatinine elevated to 6.35 and bicarb low at 15 secondary to dehydration. Repeat labs this morning. 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  No active bleeding.   Receives vitamin B12 shot every week. Recent Labs    07/04/23 1819 07/05/23 0426 07/06/23 0440 07/13/23 1514 07/17/23 2153 07/20/23 0930 07/20/23 0931 07/31/23 1152  HGB  --    < >  9.5* 11.8* 10.8* 12.4  --  9.7*  MCV  --    < > 95.3 89.6 92.6 89.4  --  88.1  VITAMINB12 116*  --   --   --   --  >7,500*  --   --   FOLATE 9.8  --   --   --   --  5.2*  --   --   FERRITIN  --   --   --    --   --   --  326*  --   TIBC  --   --   --   --   --  302  --   --   IRON  --   --   --   --   --  42  --   --    < > = values in this interval not displayed.   H/o diastolic CHF Not on meds  Hyperlipidemia Plan to resume Lipitor prior to discharge  Asthma Bronchodilators as needed  Anxiety/depression PTA meds- Abilify  Hold for now because of renal impairment   Mobility: Encourage ambulation.  Mobility protocol ordered  Goals of care   Code Status: Limited: Do not attempt resuscitation (DNR) -DNR-LIMITED -Do Not Intubate/DNI     DVT prophylaxis:  heparin  injection 5,000 Units Start: 08/01/23 1215   Antimicrobials: None Fluid: Sodium bicarb drip at 125 mL/h Consultants: Oncology Family Communication: Family at bedside  Status: Observation Level of care:  Progressive   Patient is from: Home Needs to continue in-hospital care: Needs IV hydration Anticipated d/c to: Pending clinical course   Diet:  Diet Order             Diet full liquid Fluid consistency: Thin  Diet effective now                   Scheduled Meds:  Chlorhexidine  Gluconate Cloth  6 each Topical Daily   heparin   5,000 Units Subcutaneous Q12H   metoCLOPramide  (REGLAN ) injection  5 mg Intravenous Q8H   ondansetron   4 mg Oral Q6H   Or   ondansetron  (ZOFRAN ) IV  4 mg Intravenous Q6H    PRN meds: acetaminophen  **OR** acetaminophen , HYDROmorphone  (DILAUDID ) injection, oxyCODONE , prochlorperazine , sodium chloride  flush   Infusions:   sodium bicarbonate  75 mEq in sodium chloride  0.45 % 1,075 mL infusion 125 mL/hr at 08/01/23 1033    Antimicrobials: Anti-infectives (From admission, onward)    None       Objective: Vitals:   08/01/23 0430 08/01/23 1104  BP: (!) 82/47 (!) 90/54  Pulse: 88 94  Resp:  18  Temp:  97.7 F (36.5 C)  SpO2:  98%    Intake/Output Summary (Last 24 hours) at 08/01/2023 1127 Last data filed at 08/01/2023 1114 Gross per 24 hour  Intake 967.57 ml  Output  90 ml  Net 877.57 ml   Filed Weights   08/01/23 1054  Weight: 58.6 kg   Weight change:  Body mass index is 24.41 kg/m.   Physical Exam: General exam: Pleasant, elderly Caucasian female.  Looks older than her stated age Skin: No rashes, lesions or ulcers. HEENT: Atraumatic, normocephalic, no obvious bleeding Lungs: Clear to auscultation bilaterally,  CVS: S1, S2, no murmur,   GI/Abd: Soft, mild epigastric tenderness, nondistended, bowel sound present,   CNS: Alert, awake, oriented x 3 Psychiatry: Mood appropriate Extremities: No pedal edema, no calf tenderness,   Data Review: I have personally reviewed the laboratory data and studies available.  F/u labs ordered Unresulted Labs (From admission, onward)     Start     Ordered   08/01/23 0500  HIV Antibody (routine testing w rflx)  (HIV Antibody (Routine testing w reflex) panel)  Tomorrow morning,   R        07/31/23 1737   08/01/23 0500  CBC  Tomorrow morning,   R        07/31/23 1737   08/01/23 0500  Comprehensive metabolic panel  Tomorrow morning,   R        07/31/23 1737   07/31/23 0000  CBC with Differential (Cancer Center Only)  STAT        07/31/23 1406   07/31/23 0000  CMP (Cancer Center only)  STAT        07/31/23 1406   07/31/23 0000  T4  R        07/31/23 1406   07/31/23 0000  TSH  R        07/31/23 1406            Signed, Chapman Rota, MD [facility] 08/01/2023

## 2023-08-01 NOTE — Plan of Care (Incomplete)
  Problem: Health Behavior/Discharge Planning: Goal: Ability to manage health-related needs will improve Outcome: Progressing   Problem: Clinical Measurements: Goal: Ability to maintain clinical measurements within normal limits will improve Outcome: Progressing Goal: Will remain free from infection Outcome: Progressing Goal: Diagnostic test results will improve Outcome: Progressing   Problem: Activity: Goal: Risk for activity intolerance will decrease Outcome: Progressing   Problem: Nutrition: Goal: Adequate nutrition will be maintained Outcome: Progressing   Problem: Elimination: Goal: Will not experience complications related to bowel motility Outcome: Progressing Goal: Will not experience complications related to urinary retention Outcome: Progressing   Problem: Pain Managment: Goal: General experience of comfort will improve and/or be controlled Outcome: Progressing   Problem: Safety: Goal: Ability to remain free from injury will improve Outcome: Progressing   Problem: Education: Goal: Knowledge of General Education information will improve Description: Including pain rating scale, medication(s)/side effects and non-pharmacologic comfort measures Outcome: Adequate for Discharge   Problem: Clinical Measurements: Goal: Respiratory complications will improve Outcome: Adequate for Discharge Goal: Cardiovascular complication will be avoided Outcome: Adequate for Discharge   Problem: Coping: Goal: Level of anxiety will decrease Outcome: Adequate for Discharge   Problem: Skin Integrity: Goal: Risk for impaired skin integrity will decrease Outcome: Adequate for Discharge

## 2023-08-02 ENCOUNTER — Inpatient Hospital Stay (HOSPITAL_COMMUNITY)

## 2023-08-02 ENCOUNTER — Encounter (HOSPITAL_COMMUNITY): Payer: Self-pay

## 2023-08-02 ENCOUNTER — Inpatient Hospital Stay

## 2023-08-02 DIAGNOSIS — J449 Chronic obstructive pulmonary disease, unspecified: Secondary | ICD-10-CM | POA: Diagnosis not present

## 2023-08-02 DIAGNOSIS — N184 Chronic kidney disease, stage 4 (severe): Secondary | ICD-10-CM | POA: Diagnosis not present

## 2023-08-02 DIAGNOSIS — R579 Shock, unspecified: Secondary | ICD-10-CM | POA: Diagnosis not present

## 2023-08-02 DIAGNOSIS — N179 Acute kidney failure, unspecified: Secondary | ICD-10-CM | POA: Diagnosis not present

## 2023-08-02 LAB — CBC WITH DIFFERENTIAL/PLATELET
Abs Immature Granulocytes: 0.27 K/uL — ABNORMAL HIGH (ref 0.00–0.07)
Basophils Absolute: 0 K/uL (ref 0.0–0.1)
Basophils Relative: 0 %
Eosinophils Absolute: 0.1 K/uL (ref 0.0–0.5)
Eosinophils Relative: 1 %
HCT: 23.3 % — ABNORMAL LOW (ref 36.0–46.0)
Hemoglobin: 7.8 g/dL — ABNORMAL LOW (ref 12.0–15.0)
Immature Granulocytes: 3 %
Lymphocytes Relative: 8 %
Lymphs Abs: 0.8 K/uL (ref 0.7–4.0)
MCH: 30.7 pg (ref 26.0–34.0)
MCHC: 33.5 g/dL (ref 30.0–36.0)
MCV: 91.7 fL (ref 80.0–100.0)
Monocytes Absolute: 0.6 K/uL (ref 0.1–1.0)
Monocytes Relative: 7 %
Neutro Abs: 7.4 K/uL (ref 1.7–7.7)
Neutrophils Relative %: 81 %
Platelets: 230 K/uL (ref 150–400)
RBC: 2.54 MIL/uL — ABNORMAL LOW (ref 3.87–5.11)
RDW: 15.9 % — ABNORMAL HIGH (ref 11.5–15.5)
WBC: 9.1 K/uL (ref 4.0–10.5)
nRBC: 0.2 % (ref 0.0–0.2)

## 2023-08-02 LAB — URINALYSIS, W/ REFLEX TO CULTURE (INFECTION SUSPECTED)
Bilirubin Urine: NEGATIVE
Glucose, UA: NEGATIVE mg/dL
Ketones, ur: NEGATIVE mg/dL
Nitrite: NEGATIVE
Protein, ur: 100 mg/dL — AB
RBC / HPF: >50 RBC/hpf (ref 0–5)
Specific Gravity, Urine: 1.014 (ref 1.005–1.030)
WBC, UA: >50 WBC/hpf (ref 0–5)
pH: 5 (ref 5.0–8.0)

## 2023-08-02 LAB — CBC
HCT: 27.9 % — ABNORMAL LOW (ref 36.0–46.0)
Hemoglobin: 9.3 g/dL — ABNORMAL LOW (ref 12.0–15.0)
MCH: 29.2 pg (ref 26.0–34.0)
MCHC: 33.3 g/dL (ref 30.0–36.0)
MCV: 87.5 fL (ref 80.0–100.0)
Platelets: 285 K/uL (ref 150–400)
RBC: 3.19 MIL/uL — ABNORMAL LOW (ref 3.87–5.11)
RDW: 16.4 % — ABNORMAL HIGH (ref 11.5–15.5)
WBC: 10.9 K/uL — ABNORMAL HIGH (ref 4.0–10.5)
nRBC: 0.4 % — ABNORMAL HIGH (ref 0.0–0.2)

## 2023-08-02 LAB — PREPARE RBC (CROSSMATCH)

## 2023-08-02 LAB — BASIC METABOLIC PANEL WITH GFR
Anion gap: 14 (ref 5–15)
Anion gap: 15 (ref 5–15)
BUN: 45 mg/dL — ABNORMAL HIGH (ref 8–23)
BUN: 45 mg/dL — ABNORMAL HIGH (ref 8–23)
CO2: 13 mmol/L — ABNORMAL LOW (ref 22–32)
CO2: 18 mmol/L — ABNORMAL LOW (ref 22–32)
Calcium: 7 mg/dL — ABNORMAL LOW (ref 8.9–10.3)
Calcium: 7.4 mg/dL — ABNORMAL LOW (ref 8.9–10.3)
Chloride: 100 mmol/L (ref 98–111)
Chloride: 98 mmol/L (ref 98–111)
Creatinine, Ser: 6.37 mg/dL — ABNORMAL HIGH (ref 0.44–1.00)
Creatinine, Ser: 6.2 mg/dL — ABNORMAL HIGH (ref 0.44–1.00)
GFR, Estimated: 7 mL/min — ABNORMAL LOW (ref >60)
GFR, Estimated: 7 mL/min — ABNORMAL LOW (ref >60)
Glucose, Bld: 115 mg/dL — ABNORMAL HIGH (ref 70–99)
Glucose, Bld: 148 mg/dL — ABNORMAL HIGH (ref 70–99)
Potassium: 4 mmol/L (ref 3.5–5.1)
Potassium: 3.9 mmol/L (ref 3.5–5.1)
Sodium: 127 mmol/L — ABNORMAL LOW (ref 135–145)
Sodium: 131 mmol/L — ABNORMAL LOW (ref 135–145)

## 2023-08-02 LAB — SODIUM, URINE, RANDOM: Sodium, Ur: 45 mmol/L

## 2023-08-02 LAB — LACTIC ACID, PLASMA: Lactic Acid, Venous: 2.3 mmol/L (ref 0.5–1.9)

## 2023-08-02 LAB — ABO/RH: ABO/RH(D): A POS

## 2023-08-02 MED ORDER — SODIUM BICARBONATE 8.4 % IV SOLN
INTRAVENOUS | Status: DC
  Filled 2023-08-02 (×2): qty 150
  Filled 2023-08-02: qty 1000

## 2023-08-02 MED ORDER — GUAIFENESIN-CODEINE 100-10 MG/5ML PO SOLN
5.0000 mL | ORAL | Status: DC | PRN
  Administered 2023-08-02 – 2023-08-05 (×4): 5 mL via ORAL
  Filled 2023-08-02 (×4): qty 5

## 2023-08-02 MED ORDER — SODIUM BICARBONATE 8.4 % IV SOLN
50.0000 meq | Freq: Once | INTRAVENOUS | Status: AC
  Administered 2023-08-02: 50 meq via INTRAVENOUS
  Filled 2023-08-02: qty 50

## 2023-08-02 MED ORDER — SODIUM CHLORIDE 0.9% IV SOLUTION: Freq: Once | INTRAVENOUS | Status: DC

## 2023-08-02 MED ORDER — SODIUM CHLORIDE 0.9 % IV SOLN: 250.0000 mL | INTRAVENOUS | Status: DC

## 2023-08-02 MED ORDER — MIDODRINE HCL 5 MG PO TABS: 10.0000 mg | ORAL_TABLET | Freq: Three times a day (TID) | ORAL | Status: DC

## 2023-08-02 MED ORDER — ARIPIPRAZOLE 10 MG PO TABS
30.0000 mg | ORAL_TABLET | Freq: Every day | ORAL | Status: DC
  Administered 2023-08-02 – 2023-08-05 (×4): 30 mg via ORAL
  Filled 2023-08-02 (×4): qty 3

## 2023-08-02 MED ORDER — NOREPINEPHRINE 4 MG/250ML-% IV SOLN
0.0000 ug/min | INTRAVENOUS | Status: DC
  Administered 2023-08-02: 2 ug/min via INTRAVENOUS
  Administered 2023-08-03: 15 ug/min via INTRAVENOUS
  Administered 2023-08-03: 17 ug/min via INTRAVENOUS
  Administered 2023-08-03: 21 ug/min via INTRAVENOUS
  Administered 2023-08-03: 12 ug/min via INTRAVENOUS
  Administered 2023-08-03: 18 ug/min via INTRAVENOUS
  Administered 2023-08-04: 19 ug/min via INTRAVENOUS
  Administered 2023-08-04: 21 ug/min via INTRAVENOUS
  Administered 2023-08-04 (×4): 20 ug/min via INTRAVENOUS
  Filled 2023-08-02 (×12): qty 250

## 2023-08-02 MED ORDER — NOREPINEPHRINE 4 MG/250ML-% IV SOLN: 0.0000 ug/min | INTRAVENOUS | Status: DC

## 2023-08-02 MED ORDER — CALCIUM GLUCONATE-NACL 1-0.675 GM/50ML-% IV SOLN
1.0000 g | Freq: Once | INTRAVENOUS | Status: AC
  Administered 2023-08-02: 1000 mg via INTRAVENOUS
  Filled 2023-08-02 (×2): qty 50

## 2023-08-02 MED ORDER — ONDANSETRON HCL 4 MG PO TABS
4.0000 mg | ORAL_TABLET | Freq: Four times a day (QID) | ORAL | Status: DC | PRN
  Administered 2023-08-03 – 2023-08-04 (×3): 4 mg via ORAL
  Filled 2023-08-02 (×3): qty 1

## 2023-08-02 MED ORDER — ONDANSETRON HCL 4 MG/2ML IJ SOLN
4.0000 mg | Freq: Four times a day (QID) | INTRAMUSCULAR | Status: DC | PRN
  Administered 2023-08-02: 4 mg via INTRAVENOUS
  Filled 2023-08-02: qty 2

## 2023-08-02 MED ORDER — MIDODRINE HCL 5 MG PO TABS
10.0000 mg | ORAL_TABLET | Freq: Three times a day (TID) | ORAL | Status: DC
  Administered 2023-08-02 – 2023-08-04 (×7): 10 mg via ORAL
  Filled 2023-08-02 (×7): qty 2

## 2023-08-02 NOTE — Progress Notes (Signed)
 PROGRESS NOTE  Shelia Chan  DOB: Jun 11, 1955  PCP: Shelia Leni Edyth DELENA, MD FMW:[id number]  DOA: 07/31/2023  LOS: 1 day  Hospital Day: 3  Brief narrative: Shelia Chan is a 68 y.o. female with PMH significant for CHF, COPD, CKD, neuroendocrine cancer of the lung, urethral cancer s/p cystectomy  7/14, patient presented at cancer center for first chemotherapy.  Also complained of several weeks of poor oral intake, constant nausea, vomiting, unable to tolerate any oral intake, occasional diarrhea.  She was noted to have low blood pressure of 85/53.  Chemotherapy was canceled and patient was sent to ED for further evaluation management.  In the ED, blood pressure was in the 90s, no fever, breathing room air Labs showed WBC count of 13.3, hemoglobin 9.7, sodium 128, serum bicarb low at 15, BUN/creatinine 43/6.35 which is worse than 21/3.36 from 10 days ago CT abdomen without contrast showed  -substantial persistent hepatomegaly with scattered mostly hypodense lesions throughout all lobes of the liver corresponding to previously identified hypermetabolic hepatic metastasis.  The right hepatic lobe margin extends down to the anatomic pelvis and beyond the inferior margin of CT exam.   -No hypermetabolic retroperitoneal and porta hepatis lymph nodes.   -Mild right and prominent left renal atrophy.  Ureteral stents in both collecting system and the stenting in the proximal ureters, the exit of the stents via ileal conduit is partially included.   -Known metastatic disease particularly in the lumbar spine which is not readily apparent on the CT -Cylindrical bronchiectasis in both lower lobes with mild scarring or atelectasis in the posterior basal segments of both lower lobes. Mild diffuse regional subcutaneous edema.  Aortic atherosclerosis.  Patient received IV fluid, IV Zofran , IV Dilaudid  Admitted to [facility]  Subjective: Patient was seen and examined this morning. Lying on bed.   Feels dehydrated.  Feels awful. Has been able to tolerate some liquid diet.  Does not want to advance yet. No family at bedside. Chart reviewed Afebrile, heart rate in the 80s, blood pressure in 80s consistently, breathing on room air Labs from this morning with sodium low at 127, worsening BUN/creatinine at 45/6.37, serum bicarb low at 13, hemoglobin down to 7.8  Assessment and plan: Intractable nausea and vomiting Presented with intractable nausea, vomiting, inability to tolerate oral intake leading to severe dehydration No fever, WBC count mildly elevated which is probably reactive No evidence of infection.  Not on antibiotics Continue to monitor symptoms with IV fluid, IV antiemetics. Encourage full liquid diet today with scheduled Zofran . Recent Labs  Lab 07/31/23 1152 08/01/23 1333 08/02/23 0338  WBC 13.3* 10.1 9.1   AKI on CKD 4 Metabolic acidosis HypOtension Baseline creatinine  less than 3.  Presented with creatinine elevated to 6.35 and bicarb low at 15 secondary to dehydration. Despite continued IV hydration with bicarb drip, labs are not improving.  Trend as below. Blood pressure is consistently low. 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  No active bleeding but hemoglobin downtrending, 7.8 today.  I believe,  patient will benefit from 1 unit of PRBC transfusion.  Ordered Receives vitamin B12 shot every week. Recent Labs    07/04/23 1819 07/05/23 0426 07/17/23 2153 07/20/23 0930 07/20/23 0931 07/31/23 1152 08/01/23 1333 08/02/23 0338  HGB  --    < > 10.8* 12.4  --  9.7* 8.2* 7.8*  MCV  --    < > 92.6 89.4  --  88.1 94.0 91.7  VITAMINB12 116*  --   --  >7,500*  --   --   --   --   FOLATE 9.8  --   --  5.2*  --   --   --   --   FERRITIN  --   --   --   --  326*  --   --   --   TIBC  --   --   --  302  --   --   --   --   IRON  --   --   --  42  --   --   --   --    < > = values in this interval not displayed.   H/o diastolic CHF Not on meds  Hyperlipidemia Plan to resume Lipitor prior to discharge  Asthma Bronchodilators as needed  Anxiety/depression PTA meds- Abilify  Resumed this morning at patient's request.   Mobility: Encourage ambulation.  Mobility protocol ordered  Goals of care   Code Status: Limited: Do not attempt resuscitation (DNR) -DNR-LIMITED -Do Not Intubate/DNI     DVT prophylaxis:  heparin  injection 5,000 Units Start: 08/01/23 1215   Antimicrobials: None Fluid: Sodium bicarb drip -per nephrology Consultants: Oncology Family Communication: Family not at bedside today  Status: Observation Level of care:  Progressive   Patient is from: Home Needs to continue in-hospital care: Needs IV hydration Anticipated d/c to: Pending clinical course   Diet:  Diet Order             Diet full liquid Fluid consistency: Thin  Diet effective now                   Scheduled Meds:  sodium chloride    Intravenous Once   ARIPiprazole   30 mg Oral Daily   Chlorhexidine  Gluconate Cloth  6 each Topical Daily   heparin   5,000 Units Subcutaneous Q12H   metoCLOPramide  (REGLAN ) injection  5 mg Intravenous Q8H   midodrine   10 mg Oral TID WC   ondansetron   4 mg Oral Q6H   Or   ondansetron  (ZOFRAN ) IV  4 mg Intravenous Q6H    PRN meds: acetaminophen  **OR**  acetaminophen , oxyCODONE , prochlorperazine , sodium chloride  flush   Infusions:   sodium bicarbonate  150 mEq in dextrose  5 % 1,150 mL infusion 75 mL/hr at 08/02/23 1000    Antimicrobials: Anti-infectives (From admission, onward)    None       Objective: Vitals:   08/02/23 1206 08/02/23 1209  BP: (!) 83/50 (!) 89/52  Pulse: 81   Resp: 20   Temp: 97.8 F (36.6 C)   SpO2: 94%     Intake/Output Summary (Last 24 hours) at 08/02/2023 1333 Last data filed at 08/01/2023 1628 Gross per 24 hour  Intake 635.56 ml  Output 30 ml  Net 605.56 ml   Filed Weights   08/01/23 1054  Weight:  58.6 kg   Weight change:  Body mass index is 24.41 kg/m.   Physical Exam: General exam: Pleasant, elderly Caucasian female.  Looks older than her stated age Skin: No rashes, lesions or ulcers. HEENT: Atraumatic, normocephalic, no obvious bleeding Lungs: Clear to auscultation bilaterally,  CVS: S1, S2, no murmur,   GI/Abd: Soft, mild epigastric tenderness, nondistended, bowel sound present,   CNS: Alert, awake, oriented x 3 Psychiatry: Mood appropriate Extremities: No pedal edema, no calf tenderness,   Data Review: I have personally reviewed the laboratory data and studies available.  F/u labs ordered Unresulted Labs (From admission, onward)     Start     Ordered   08/02/23 1332  Prepare RBC (crossmatch)  (Blood Administration Adult)  Once,   R       Question Answer Comment  # of Units 1 unit   Transfusion Indications Hemoglobin 8 gm/dL or less and orthopedic or cardiac surgery or pre-existing cardiac condition   Number of Units to Keep Ahead NO units ahead   If emergent release call blood bank Not emergent release      08/02/23 1331   08/02/23 0906  Sodium, urine, random  Once,   R        08/02/23 0905   07/31/23 0000  CBC with Differential (Cancer Center Only)  STAT        07/31/23 1406   07/31/23 0000  CMP (Cancer Center only)  STAT        07/31/23 1406   07/31/23 0000  T4  R         07/31/23 1406   07/31/23 0000  TSH  R        07/31/23 1406            Signed, Chapman Rota, MD [facility] 08/02/2023

## 2023-08-02 NOTE — Consult Note (Signed)
 [facility] Renal Consultation Note  Requesting MD: Chapman Rota, MD  Indication for Consultation:  AKI   Chief complaint: came from clinic with low blood pressure   HPI:  Shelia Chan is a 68 y.o. female with a history of chronic diastolic CHF, COPD, CKD, neuroendocrine carcinoma of the lung, as well as urethral carcinoma status post cystectomy who presented to the hospital from the cancer center due to hypotension.  Her chemotherapy was canceled in response to the hypotension.  She has had nausea with vomiting for the past week.  Creatinine trends are demonstrated below.  Her creatinine has most recently been in the mid to high twos and was 3 on 07/17/2023.  On 7/14 creatinine was noted to be 6.35.  Creatinine is elevated but stable at 6.37 on the date of consult, 7/16.  She has been on fluids with bicarbonate.  This AM was transitioned to bicarb gtt on consult.  She has had 80 mL UOP over 7/15 charted.  CT on 7/14 with bilateral ureteral stents.  Spoke with the patient - she follows with [facility] - Dr. Macel.  She is not sure what she would want to do if her kidneys were to worsen.  Her family is at bedside.  She has been hypotensive today - at one point today was in the 70's systolic.  Improved to the 80's but still low.  Updated her family at bedside.   Creatinine  Date/Time Value Ref Range Status  07/31/2023 11:52 AM 6.35 (H) 0.44 - 1.00 mg/dL Final  92/96/7974 90:69 AM 3.36 (H) 0.44 - 1.00 mg/dL Final  93/73/7974 96:85 PM 3.27 (H) 0.44 - 1.00 mg/dL Final   Creat  Date/Time Value Ref Range Status  01/27/2022 10:06 AM 2.05 (H) 0.50 - 1.05 mg/dL Final  92/79/7976 90:72 AM 1.68 (H) 0.50 - 1.05 mg/dL Final  94/84/7976 97:92 PM 1.94 (H) 0.50 - 1.05 mg/dL Final   Creatinine, Ser  Date/Time Value Ref Range Status  08/02/2023 03:38 AM 6.37 (H) 0.44 - 1.00 mg/dL Final  92/84/7974 98:66 PM 6.28 (H) 0.44 - 1.00 mg/dL Final  93/69/7974 88:58 PM 3.03 (H) 0.44 -  1.00 mg/dL Final  93/79/7974 95:78 AM 2.68 (H) 0.44 - 1.00 mg/dL Final  93/80/7974 95:59 AM 2.83 (H) 0.44 - 1.00 mg/dL Final  93/81/7974 95:73 AM 2.73 (H) 0.44 - 1.00 mg/dL Final  93/82/7974 94:99 AM 2.65 (H) 0.44 - 1.00 mg/dL Final  93/83/7974 95:76 AM 2.36 (H) 0.44 - 1.00 mg/dL Final  93/84/7974 95:72 PM 2.29 (H) 0.44 - 1.00 mg/dL Final  94/84/7974 87:84 PM 2.52 (H) 0.44 - 1.00 mg/dL Final  95/90/7974 87:99 PM 2.39 (H) 0.44 - 1.00 mg/dL Final  96/93/7974 98:91 PM 2.27 (H) 0.44 - 1.00 mg/dL Final  87/85/7975 92:53 AM 2.13 (H) 0.44 - 1.00 mg/dL Final  87/86/7975 94:43 AM 2.11 (H) 0.44 - 1.00 mg/dL Final  87/87/7975 96:81 PM 2.40 (H) 0.44 - 1.00 mg/dL Final  87/87/7975 97:69 PM 1.97 (H) 0.44 - 1.00 mg/dL Final  92/88/7975 94:61 AM 2.21 (H) 0.44 - 1.00 mg/dL Final  92/89/7975 90:91 AM 2.25 (H) 0.44 - 1.00 mg/dL Final  92/90/7975 94:51 AM 2.40 (H) 0.44 - 1.00 mg/dL Final  92/91/7975 98:91 AM 2.67 (H) 0.44 - 1.00 mg/dL Final  92/92/7975 89:85 PM 2.61 (H) 0.44 - 1.00 mg/dL Final  95/91/7976 96:63 AM 1.36 (H) 0.44 - 1.00 mg/dL Final  95/92/7976 96:61 AM 1.49 (H) 0.44 - 1.00 mg/dL Final  95/93/7976 96:71 AM 1.69 (H)  0.44 - 1.00 mg/dL Final  95/94/7976 94:49 AM 1.62 (H) 0.44 - 1.00 mg/dL Final  95/95/7976 90:41 PM 1.91 (H) 0.44 - 1.00 mg/dL Final  96/91/7976 95:53 AM 1.76 (H) 0.44 - 1.00 mg/dL Final  96/92/7976 95:61 AM 1.76 (H) 0.44 - 1.00 mg/dL Final  96/94/7976 95:78 AM 1.75 (H) 0.44 - 1.00 mg/dL Final  96/95/7976 95:93 AM 1.67 (H) 0.44 - 1.00 mg/dL Final  96/98/7976 95:75 AM 1.58 (H) 0.44 - 1.00 mg/dL Final  97/71/7976 95:87 AM 1.94 (H) 0.44 - 1.00 mg/dL Final  97/72/7976 95:97 AM 1.61 (H) 0.44 - 1.00 mg/dL Final  97/73/7976 95:60 AM 1.87 (H) 0.44 - 1.00 mg/dL Final  97/74/7976 88:76 PM 2.13 (H) 0.44 - 1.00 mg/dL Final  89/71/7977 95:74 AM 1.74 (H) 0.44 - 1.00 mg/dL Final  89/72/7977 95:52 AM 1.81 (H) 0.44 - 1.00 mg/dL Final  89/73/7977 95:54 AM 1.76 (H) 0.44 - 1.00 mg/dL Final   89/74/7977 93:51 AM 1.89 (H) 0.44 - 1.00 mg/dL Final  89/75/7977 95:61 AM 1.69 (H) 0.44 - 1.00 mg/dL Final  89/76/7977 87:76 PM 1.78 (H) 0.44 - 1.00 mg/dL Final  92/83/7977 89:83 AM 2.05 (H) 0.44 - 1.00 mg/dL Final  92/95/7977 95:91 AM 1.69 (H) 0.44 - 1.00 mg/dL Final  92/96/7977 96:66 AM 1.84 (H) 0.44 - 1.00 mg/dL Final  92/97/7977 95:75 AM 1.75 (H) 0.44 - 1.00 mg/dL Final  92/98/7977 93:83 AM 2.01 (H) 0.44 - 1.00 mg/dL Final  91/97/7978 95:44 PM 2.23 (H) 0.44 - 1.00 mg/dL Final  96/75/7978 93:97 AM 2.03 (H) 0.44 - 1.00 mg/dL Final  96/76/7978 95:90 AM 2.36 (H) 0.44 - 1.00 mg/dL Final  96/77/7978 95:92 PM 2.35 (H) 0.44 - 1.00 mg/dL Final  97/77/7978 93:51 PM 2.61 (H) 0.44 - 1.00 mg/dL Final  97/81/7978 95:62 PM 2.50 (H) 0.44 - 1.00 mg/dL Final     PMHx:   Past Medical History:  Diagnosis Date   Arthritis    Asthma    Blood transfusion without reported diagnosis    Cancer (HCC)    CHF (congestive heart failure) (HCC)    COPD (chronic obstructive pulmonary disease) (HCC)    Heart murmur    Immune deficiency disorder (HCC)    Renal disorder     Past Surgical History:  Procedure Laterality Date   ABDOMINAL HYSTERECTOMY     BLADDER REMOVAL  118/22/2015   FRACTURE SURGERY Left 02/2018   pins   IR CATHETER TUBE CHANGE  04/15/2019   IR CATHETER TUBE CHANGE  04/15/2019   IR EXT NEPHROURETERAL CATH EXCHANGE  07/15/2019   IR EXT NEPHROURETERAL CATH EXCHANGE  10/14/2019   IR EXT NEPHROURETERAL CATH EXCHANGE  10/14/2019   IR EXT NEPHROURETERAL CATH EXCHANGE  01/06/2020   IR EXT NEPHROURETERAL CATH EXCHANGE  03/16/2020   IR EXT NEPHROURETERAL CATH EXCHANGE  05/25/2020   IR EXT NEPHROURETERAL CATH EXCHANGE  07/17/2020   IR EXT NEPHROURETERAL CATH EXCHANGE  07/17/2020   IR EXT NEPHROURETERAL CATH EXCHANGE  08/14/2020   IR EXT NEPHROURETERAL CATH EXCHANGE  09/25/2020   IR EXT NEPHROURETERAL CATH EXCHANGE  11/06/2020   IR EXT NEPHROURETERAL CATH EXCHANGE  12/25/2020   IR EXT  NEPHROURETERAL CATH EXCHANGE  02/09/2021   IR EXT NEPHROURETERAL CATH EXCHANGE  03/19/2021   IR EXT NEPHROURETERAL CATH EXCHANGE  03/19/2021   IR EXT NEPHROURETERAL CATH EXCHANGE  04/23/2021   IR EXT NEPHROURETERAL CATH EXCHANGE  04/23/2021   IR EXT NEPHROURETERAL CATH EXCHANGE  06/04/2021   IR EXT NEPHROURETERAL CATH EXCHANGE  07/15/2021   IR EXT NEPHROURETERAL CATH EXCHANGE  09/02/2021   IR EXT NEPHROURETERAL CATH EXCHANGE  10/14/2021   IR EXT NEPHROURETERAL CATH EXCHANGE  11/25/2021   IR EXT NEPHROURETERAL CATH EXCHANGE  01/06/2022   IR EXT NEPHROURETERAL CATH EXCHANGE  02/17/2022   IR EXT NEPHROURETERAL CATH EXCHANGE  03/31/2022   IR EXT NEPHROURETERAL CATH EXCHANGE  05/12/2022   IR EXT NEPHROURETERAL CATH EXCHANGE  06/30/2022   IR EXT NEPHROURETERAL CATH EXCHANGE  08/18/2022   IR EXT NEPHROURETERAL CATH EXCHANGE  10/06/2022   IR EXT NEPHROURETERAL CATH EXCHANGE  11/24/2022   IR EXT NEPHROURETERAL CATH EXCHANGE  02/09/2023   IR EXT NEPHROURETERAL CATH EXCHANGE  03/23/2023   IR EXT NEPHROURETERAL CATH EXCHANGE  04/26/2023   IR EXT NEPHROURETERAL CATH EXCHANGE  06/01/2023   IR EXT NEPHROURETERAL CATH EXCHANGE  07/06/2023   IR IMAGING GUIDED PORT INSERTION  07/28/2023   IR NEPHROSTOMY EXCHANGE LEFT  12/30/2022   IR NEPHROSTOMY EXCHANGE RIGHT  12/30/2022   IR PERC TUN PERIT CATH WO PORT S&I /IMAG  03/25/2021   IR REMOVAL TUN CV CATH W/O FL  06/04/2021    Family Hx:  Family History  Problem Relation Age of Onset   Heart disease Maternal Grandfather     Social History:  reports that she has been smoking cigarettes and e-cigarettes. She started smoking about 54 years ago. She has a 50 pack-year smoking history. She has never used smokeless tobacco. She reports that she does not currently use alcohol. She reports current drug use. Drug: Marijuana.  Allergies:  Allergies  Allergen Reactions   Metoprolol  Shortness Of Breath   Shellfish-Derived Products Other (See Comments)    MD said to  NOT eat this due to the urostomy   Tape Other (See Comments)    CLEAR, PLASTIC, HOSPITAL TAPE PULLS OFF THE SKIN!!!    Medications: Prior to Admission medications   Medication Sig Start Date End Date Taking? Authorizing Provider  albuterol  (VENTOLIN  HFA) 108 (90 Base) MCG/ACT inhaler Inhale 1-2 puffs into the lungs every 6 (six) hours as needed for shortness of breath. 05/22/20  Yes [provider]  ARIPiprazole  (ABILIFY ) 30 MG tablet Take 30 mg by mouth daily. 03/19/19  Yes [provider]  atorvastatin  (LIPITOR) 40 MG tablet Take 1 tablet daily at bedtime. 03/17/22  Yes   cyanocobalamin  (VITAMIN B12) 1000 MCG/ML injection Daily for 6 days, then weekly for 4 times, then monthly Patient taking differently: Inject 1,000 mcg into the skin every Sunday. 07/13/23  Yes Tina [name] BROCKS, MD  denosumab  (PROLIA ) 60 MG/ML SOSY injection Inject 60 mg [place] every 6 months 05/23/22  Yes   lidocaine -prilocaine  (EMLA ) cream Apply 1 Application topically as needed. Patient taking differently: Apply 1 Application topically as needed (to numb site). 07/20/23  Yes Heilingoetter, Cassandra L, PA-C  ondansetron  (ZOFRAN -ODT) 8 MG disintegrating tablet Take 1 tablet (8 mg total) by mouth every 8 (eight) hours as needed for nausea or vomiting. Starting day 3 after chemo Patient taking differently: Take 8 mg by mouth every 8 (eight) hours as needed for nausea or vomiting (dissolve orally). Starting day 3 after chemo 07/20/23  Yes Heilingoetter, Cassandra L, PA-C  Oxycodone  HCl 10 MG TABS Take 1 tablet by mouth every 8 hours as needed for pain 07/27/23  Yes   prochlorperazine  (COMPAZINE ) 10 MG tablet Take 1 tablet (10 mg total) by mouth every 6 (six) hours as needed. Patient taking differently: Take 10 mg by mouth every 6 (six) hours  as needed for nausea or vomiting. 07/20/23  Yes Heilingoetter, Cassandra L, PA-C  promethazine  (PHENERGAN ) 12.5 MG tablet Take 1 tablet by mouth every 8 hours as needed for nausea 07/12/23   Yes   calcitRIOL  (ROCALTROL ) 0.25 MCG capsule Take 0.25 mcg by mouth daily. 02/12/21   [provider]  oxyCODONE  (OXY IR/ROXICODONE ) 5 MG immediate release tablet Take 1 tablet (5 mg total) by mouth every 6 (six) hours as needed for severe pain Patient taking differently: Take 10 mg by mouth every 6 (six) hours as needed. 12/02/22       I have reviewed the patient's current and reported prior to admission medications.  Labs:     Latest Ref Rng & Units 08/02/2023    3:38 AM 08/01/2023    1:33 PM 07/31/2023   11:52 AM  BMP  Glucose 70 - 99 mg/dL 884  897  898   BUN 8 - 23 mg/dL 45  45  43   Creatinine 0.44 - 1.00 mg/dL 3.62  3.71  3.64   Sodium 135 - 145 mmol/L 127  129  128   Potassium 3.5 - 5.1 mmol/L 4.0  4.6  4.9   Chloride 98 - 111 mmol/L 100  102  99   CO2 22 - 32 mmol/L 13  10  15    Calcium  8.9 - 10.3 mg/dL 7.0  7.5  8.4     Urinalysis    Component Value Date/Time   COLORURINE YELLOW 07/17/2023 1839   APPEARANCEUR HAZY (A) 07/17/2023 1839   LABSPEC 1.006 07/17/2023 1839   PHURINE 7.0 07/17/2023 1839   GLUCOSEU NEGATIVE 07/17/2023 1839   HGBUR SMALL (A) 07/17/2023 1839   BILIRUBINUR NEGATIVE 07/17/2023 1839   KETONESUR NEGATIVE 07/17/2023 1839   PROTEINUR 30 (A) 07/17/2023 1839   NITRITE POSITIVE (A) 07/17/2023 1839   LEUKOCYTESUR LARGE (A) 07/17/2023 1839     ROS:  Pertinent items noted in HPI and remainder of comprehensive ROS otherwise negative.  Physical Exam: Vitals:   08/02/23 1559 08/02/23 1710  BP: (!) 82/49 (!) 85/49  Pulse:  77  Resp:    Temp:  98 F (36.7 C)  SpO2: 97% 94%     General: elderly female in bed in NAD   HEENT: NCAT Eyes: EOMI sclera anicteric Neck: supple trachea midline  Heart: S1S2 no rub Lungs: clear and unlabored; normal work of breathing at rest on room air  Abdomen: soft/nt/nd Extremities: 1+ edema lower extremities Skin: no rash on extremities exposed  Neuro: alert and oriented x 3 provides hx and follows  commands Psych: normal mood and affect  Assessment/Plan:  # AKI - Secondary to ischemic and prerenal and ischemic insults in the setting of hypotension as well as nausea/decreased PO intake.  Complicated urologic history and s/p cystectomy.  Has bilateral renal stents on CT and on today's renal ultrasound has no hydronephrosis.  Note that the left kidney is atrophic so decreased renal reserve - I have started midodrine  - Continue fluids - transitioned to bicarb gtt - agree with plans to move to the ICU for pressors; treating hypotension is key to treating her renal failure   # Metabolic acidosis - Transition to bicarb drip  # Hypotension - Started midodrine  - Continue bicarb as above - spoke with primary team and rapid came by earlier to see patient - recommend moving her to the ICU for pressors   # Hyponatremia  - May be pre-renal  - Follow with fluids and with treatment of AKI   #  Hypocalcemia  - improves with correction for albumin  but is on bicarb gtt now  - replete calcium    # Normocytic Anemia  - Suspect contribution of CKD as well as malignancy - Would have a low threshold for transfusion for her if Hb drops.  PRBCs per primary team  Thank you for the consult.  Please do not hesitate to contact me with any questions regarding our patient   Katheryn [name] Saba 08/02/2023, 6:03 PM

## 2023-08-02 NOTE — Progress Notes (Signed)
 eLink Physician-Brief Progress Note Patient Name: Shelia Chan DOB: 1955-05-25 MRN: [id number]   Date of Service  08/02/2023  HPI/Events of Note  Small cell lung cancer, presented with hypovolemic shock which is generally improved as a cough and requesting cough suppressant  eICU Interventions  Initiate antitussives with codeine      Intervention Category Minor Interventions: Routine modifications to care plan (e.g. PRN medications for pain, fever)  Shelia Chan 08/02/2023, 8:46 PM

## 2023-08-02 NOTE — Consult Note (Addendum)
 NAME:  Shelia Chan, MRN:  [id number], DOB:  1956-01-12, LOS: 1 ADMISSION DATE:  07/31/2023, CONSULTATION DATE: 08/02/2023 REFERRING MD: Dr Arlice CHIEF COMPLAINT: Hypovolemic shock  History of Present Illness:  68 year old woman with a history of extensive stage small cell lung cancer diagnosed 06/2023, urothelial cancer with cystectomy, ilial conduit and ureteral stenting.  Also with COPD/asthma, CKD stage IV, plan for chemotherapy to initiate 07/31/2023.  On arrival for that visit she was noted to be hypotensive, started on IV fluids.  Also with bilateral lower extremity edema.  Her lab work revealed a creatinine of 6.35 (up from 3.36 10 days prior).  She has received maintenance IV fluids, no fluid boluses.  She received 1 unit PRBC on 7/16 for hemoglobin 7.8.  Abx have been deferred.  SBP has remained in the high 80s.  She is on room air.  She moved now to stepdown/ICU for further care.   Pertinent  Medical History   Past Medical History:  Diagnosis Date   Arthritis    Asthma    Blood transfusion without reported diagnosis    Cancer (HCC)    CHF (congestive heart failure) (HCC)    COPD (chronic obstructive pulmonary disease) (HCC)    Heart murmur    Immune deficiency disorder (HCC)    Renal disorder      Significant Hospital Events: Including procedures, antibiotic start and stop dates in addition to other pertinent events   7/14 CT abdomen > hepatomegaly with metastatic disease throughout the liver, no hypermetabolic retroperitoneal and porta hepatis lymph nodes, mild right and prominent left renal atrophy with ureteral stents bilaterally in the proximal ureters, bibasilar cylindrical bronchiectasis  Interim History / Subjective:   Maintenance IV fluids have been running since admission, no boluses Bicarbonate infusion started 7/16 Remains hypotensive, BP 82-89/49-52.  1 unit PRBC given today 7/16 SCr 6.35 > 6.28 > 6.37.  Urine output 80 cc over the last 24  hours  Objective    Blood pressure (!) 88/50, pulse 77, temperature 98 F (36.7 C), resp. rate 16, height 5' 1 (1.549 m), weight 58.6 kg, SpO2 94%.        Intake/Output Summary (Last 24 hours) at 08/02/2023 1809 Last data filed at 08/02/2023 1700 Gross per 24 hour  Intake 240 ml  Output --  Net 240 ml   Filed Weights   08/01/23 1054  Weight: 58.6 kg    Examination: General: Thin elderly woman laying in bed in no distress HENT: Oropharynx is dry, tongue somewhat coated.  No thrush Lungs: Clear bilaterally Cardiovascular: Hyperdynamic, no murmur Abdomen: Nondistended, positive bowel sounds Extremities: 1+ pretibial edema Neuro: Awake, alert, interacting appropriately, follows commands, answers questions  Resolved problem list   Assessment and Plan   Shock, presumed hypovolemic.  Consider septic shock: No clear source identified - Agree with moved to ICU/stepdown - Would push IVF resuscitation, bolus - Okay to initiate norepinephrine  via PIV or her port, goal MAP 65 - Send blood cultures, respiratory culture if she can produce, UA. - Check chest x-ray to ensure no interval change - Hold off on antibiotics for now with low threshold to start if she has persistent clinical decline  Acute on chronic stage IV renal failure due to hypoperfusion, ATN superimposed on a chronic disease - Will work to restore adequate perfusion - continue bicarb gtt for now - Given her extensive stage small cell lung cancer, unclear whether she would be a candidate for HD.  May be able to consider  temporarily but would need to have good goals of care discussion before considering  Small cell lung cancer, extensive stage - Has not yet been treated and has extensive metastatic disease.  Again we will have to discuss goals for care here.  She was clearly not in any shape to undergo chemotherapy as planned on 07/31/2023.  She would need to stabilize significantly, strengthen significantly in order to  tolerate  History of urothelial cell carcinoma post cystectomy, ileal conduit, ureteral stenting. -She will be due for exchange of her ureteral stents soon. - Could consider possible ur`inary infectious source.  Will check UA.  Involve urology or IR if we believe she needs further evaluation for possible infection, septic source.  COPD/asthma - Not currently on any bronchodilators.  Will make albuterol  available as needed  Best Practice (right click and Reselect all SmartList Selections daily)   Diet/type: full liquids  DVT prophylaxis prophylactic heparin   Pressure ulcer(s): N/A GI prophylaxis: N/A Lines: Central line Foley:  Yes, and it is still needed >> port Code Status:  DNR Last date of multidisciplinary goals of care discussion [pending]  Labs   CBC: Recent Labs  Lab 07/31/23 1152 08/01/23 1333 08/02/23 0338  WBC 13.3* 10.1 9.1  NEUTROABS 11.7*  --  7.4  HGB 9.7* 8.2* 7.8*  HCT 28.1* 25.2* 23.3*  MCV 88.1 94.0 91.7  PLT 321 244 230    Basic Metabolic Panel: Recent Labs  Lab 07/31/23 1152 08/01/23 1333 08/02/23 0338  NA 128* 129* 127*  K 4.9 4.6 4.0  CL 99 102 100  CO2 15* 10* 13*  GLUCOSE 101* 102* 115*  BUN 43* 45* 45*  CREATININE 6.35* 6.28* 6.37*  CALCIUM  8.4* 7.5* 7.0*   GFR: Estimated Creatinine Clearance: 6.9 mL/min (A) (by C-G formula based on SCr of 6.37 mg/dL (H)). Recent Labs  Lab 07/31/23 1152 08/01/23 1333 08/02/23 0338  WBC 13.3* 10.1 9.1    Liver Function Tests: Recent Labs  Lab 07/31/23 1152 08/01/23 1333  AST 90* 99*  ALT 9 12  ALKPHOS 285* 244*  BILITOT 1.3* 2.1*  PROT 6.5 5.8*  ALBUMIN  3.4* 2.7*   No results for input(s): LIPASE, AMYLASE in the last 168 hours. No results for input(s): AMMONIA in the last 168 hours.  ABG    Component Value Date/Time   TCO2 22 12/29/2022 1518     Coagulation Profile: No results for input(s): INR, PROTIME in the last 168 hours.  Cardiac Enzymes: No results for  input(s): CKTOTAL, CKMB, CKMBINDEX, TROPONINI in the last 168 hours.  HbA1C: No results found for: HGBA1C  CBG: No results for input(s): GLUCAP in the last 168 hours.  Review of Systems:   As per HPI  Past Medical History:  She,  has a past medical history of Arthritis, Asthma, Blood transfusion without reported diagnosis, Cancer (HCC), CHF (congestive heart failure) (HCC), COPD (chronic obstructive pulmonary disease) (HCC), Heart murmur, Immune deficiency disorder (HCC), and Renal disorder.   Surgical History:   Past Surgical History:  Procedure Laterality Date   ABDOMINAL HYSTERECTOMY     BLADDER REMOVAL  118/22/2015   FRACTURE SURGERY Left 02/2018   pins   IR CATHETER TUBE CHANGE  04/15/2019   IR CATHETER TUBE CHANGE  04/15/2019   IR EXT NEPHROURETERAL CATH EXCHANGE  07/15/2019   IR EXT NEPHROURETERAL CATH EXCHANGE  10/14/2019   IR EXT NEPHROURETERAL CATH EXCHANGE  10/14/2019   IR EXT NEPHROURETERAL CATH EXCHANGE  01/06/2020   IR EXT NEPHROURETERAL CATH EXCHANGE  03/16/2020   IR EXT NEPHROURETERAL CATH EXCHANGE  05/25/2020   IR EXT NEPHROURETERAL CATH EXCHANGE  07/17/2020   IR EXT NEPHROURETERAL CATH EXCHANGE  07/17/2020   IR EXT NEPHROURETERAL CATH EXCHANGE  08/14/2020   IR EXT NEPHROURETERAL CATH EXCHANGE  09/25/2020   IR EXT NEPHROURETERAL CATH EXCHANGE  11/06/2020   IR EXT NEPHROURETERAL CATH EXCHANGE  12/25/2020   IR EXT NEPHROURETERAL CATH EXCHANGE  02/09/2021   IR EXT NEPHROURETERAL CATH EXCHANGE  03/19/2021   IR EXT NEPHROURETERAL CATH EXCHANGE  03/19/2021   IR EXT NEPHROURETERAL CATH EXCHANGE  04/23/2021   IR EXT NEPHROURETERAL CATH EXCHANGE  04/23/2021   IR EXT NEPHROURETERAL CATH EXCHANGE  06/04/2021   IR EXT NEPHROURETERAL CATH EXCHANGE  07/15/2021   IR EXT NEPHROURETERAL CATH EXCHANGE  09/02/2021   IR EXT NEPHROURETERAL CATH EXCHANGE  10/14/2021   IR EXT NEPHROURETERAL CATH EXCHANGE  11/25/2021   IR EXT NEPHROURETERAL CATH EXCHANGE  01/06/2022    IR EXT NEPHROURETERAL CATH EXCHANGE  02/17/2022   IR EXT NEPHROURETERAL CATH EXCHANGE  03/31/2022   IR EXT NEPHROURETERAL CATH EXCHANGE  05/12/2022   IR EXT NEPHROURETERAL CATH EXCHANGE  06/30/2022   IR EXT NEPHROURETERAL CATH EXCHANGE  08/18/2022   IR EXT NEPHROURETERAL CATH EXCHANGE  10/06/2022   IR EXT NEPHROURETERAL CATH EXCHANGE  11/24/2022   IR EXT NEPHROURETERAL CATH EXCHANGE  02/09/2023   IR EXT NEPHROURETERAL CATH EXCHANGE  03/23/2023   IR EXT NEPHROURETERAL CATH EXCHANGE  04/26/2023   IR EXT NEPHROURETERAL CATH EXCHANGE  06/01/2023   IR EXT NEPHROURETERAL CATH EXCHANGE  07/06/2023   IR IMAGING GUIDED PORT INSERTION  07/28/2023   IR NEPHROSTOMY EXCHANGE LEFT  12/30/2022   IR NEPHROSTOMY EXCHANGE RIGHT  12/30/2022   IR PERC TUN PERIT CATH WO PORT S&I /IMAG  03/25/2021   IR REMOVAL TUN CV CATH W/O FL  06/04/2021     Social History:   reports that she has been smoking cigarettes and e-cigarettes. She started smoking about 54 years ago. She has a 50 pack-year smoking history. She has never used smokeless tobacco. She reports that she does not currently use alcohol. She reports current drug use. Drug: Marijuana.   Family History:  Her family history includes Heart disease in her maternal grandfather.   Allergies Allergies  Allergen Reactions   Metoprolol  Shortness Of Breath   Shellfish-Derived Products Other (See Comments)    MD said to NOT eat this due to the urostomy   Tape Other (See Comments)    CLEAR, PLASTIC, HOSPITAL TAPE PULLS OFF THE SKIN!!!     Home Medications  Prior to Admission medications   Medication Sig Start Date End Date Taking? Authorizing Provider  albuterol  (VENTOLIN  HFA) 108 (90 Base) MCG/ACT inhaler Inhale 1-2 puffs into the lungs every 6 (six) hours as needed for shortness of breath. 05/22/20  Yes [provider]  ARIPiprazole  (ABILIFY ) 30 MG tablet Take 30 mg by mouth daily. 03/19/19  Yes [provider]  atorvastatin  (LIPITOR) 40 MG  tablet Take 1 tablet daily at bedtime. 03/17/22  Yes   cyanocobalamin  (VITAMIN B12) 1000 MCG/ML injection Daily for 6 days, then weekly for 4 times, then monthly Patient taking differently: Inject 1,000 mcg into the skin every Sunday. 07/13/23  Yes Tina [name] BROCKS, MD  denosumab  (PROLIA ) 60 MG/ML SOSY injection Inject 60 mg [place] every 6 months 05/23/22  Yes   lidocaine -prilocaine  (EMLA ) cream Apply 1 Application topically as needed. Patient taking differently: Apply 1 Application topically as needed (  to numb site). 07/20/23  Yes Heilingoetter, Cassandra L, PA-C  ondansetron  (ZOFRAN -ODT) 8 MG disintegrating tablet Take 1 tablet (8 mg total) by mouth every 8 (eight) hours as needed for nausea or vomiting. Starting day 3 after chemo Patient taking differently: Take 8 mg by mouth every 8 (eight) hours as needed for nausea or vomiting (dissolve orally). Starting day 3 after chemo 07/20/23  Yes Heilingoetter, Cassandra L, PA-C  Oxycodone  HCl 10 MG TABS Take 1 tablet by mouth every 8 hours as needed for pain 07/27/23  Yes   prochlorperazine  (COMPAZINE ) 10 MG tablet Take 1 tablet (10 mg total) by mouth every 6 (six) hours as needed. Patient taking differently: Take 10 mg by mouth every 6 (six) hours as needed for nausea or vomiting. 07/20/23  Yes Heilingoetter, Cassandra L, PA-C  promethazine  (PHENERGAN ) 12.5 MG tablet Take 1 tablet by mouth every 8 hours as needed for nausea 07/12/23  Yes   calcitRIOL  (ROCALTROL ) 0.25 MCG capsule Take 0.25 mcg by mouth daily. 02/12/21   [provider]  oxyCODONE  (OXY IR/ROXICODONE ) 5 MG immediate release tablet Take 1 tablet (5 mg total) by mouth every 6 (six) hours as needed for severe pain Patient taking differently: Take 10 mg by mouth every 6 (six) hours as needed. 12/02/22        Critical care time: 40 minutes      Lamar Chris, MD, PhD 08/02/2023, 6:10 PM [facility] [phone] or if no answer before 7:00PM call [phone] For any  issues after 7:00PM please call eLink [phone]

## 2023-08-03 ENCOUNTER — Inpatient Hospital Stay (HOSPITAL_COMMUNITY)

## 2023-08-03 DIAGNOSIS — J449 Chronic obstructive pulmonary disease, unspecified: Secondary | ICD-10-CM | POA: Diagnosis not present

## 2023-08-03 DIAGNOSIS — N179 Acute kidney failure, unspecified: Secondary | ICD-10-CM | POA: Diagnosis not present

## 2023-08-03 DIAGNOSIS — R579 Shock, unspecified: Secondary | ICD-10-CM | POA: Diagnosis not present

## 2023-08-03 DIAGNOSIS — N184 Chronic kidney disease, stage 4 (severe): Secondary | ICD-10-CM | POA: Diagnosis not present

## 2023-08-03 LAB — URINE CULTURE

## 2023-08-03 LAB — CBC WITH DIFFERENTIAL/PLATELET
Abs Immature Granulocytes: 1.06 K/uL — ABNORMAL HIGH (ref 0.00–0.07)
Basophils Absolute: 0.1 K/uL (ref 0.0–0.1)
Basophils Relative: 1 %
Eosinophils Absolute: 0.1 K/uL (ref 0.0–0.5)
Eosinophils Relative: 1 %
HCT: 28.9 % — ABNORMAL LOW (ref 36.0–46.0)
Hemoglobin: 9.8 g/dL — ABNORMAL LOW (ref 12.0–15.0)
Immature Granulocytes: 8 %
Lymphocytes Relative: 9 %
Lymphs Abs: 1.3 K/uL (ref 0.7–4.0)
MCH: 29.6 pg (ref 26.0–34.0)
MCHC: 33.9 g/dL (ref 30.0–36.0)
MCV: 87.3 fL (ref 80.0–100.0)
Monocytes Absolute: 1.1 K/uL — ABNORMAL HIGH (ref 0.1–1.0)
Monocytes Relative: 8 %
Neutro Abs: 10.1 K/uL — ABNORMAL HIGH (ref 1.7–7.7)
Neutrophils Relative %: 73 %
Platelets: 338 K/uL (ref 150–400)
RBC: 3.31 MIL/uL — ABNORMAL LOW (ref 3.87–5.11)
RDW: 17 % — ABNORMAL HIGH (ref 11.5–15.5)
WBC: 13.7 K/uL — ABNORMAL HIGH (ref 4.0–10.5)
nRBC: 0.9 % — ABNORMAL HIGH (ref 0.0–0.2)

## 2023-08-03 LAB — TYPE AND SCREEN
ABO/RH(D): A POS
Antibody Screen: NEGATIVE
Unit division: 0

## 2023-08-03 LAB — BASIC METABOLIC PANEL WITH GFR
Anion gap: 19 — ABNORMAL HIGH (ref 5–15)
BUN: 42 mg/dL — ABNORMAL HIGH (ref 8–23)
CO2: 17 mmol/L — ABNORMAL LOW (ref 22–32)
Calcium: 6.8 mg/dL — ABNORMAL LOW (ref 8.9–10.3)
Chloride: 95 mmol/L — ABNORMAL LOW (ref 98–111)
Creatinine, Ser: 5.09 mg/dL — ABNORMAL HIGH (ref 0.44–1.00)
GFR, Estimated: 9 mL/min — ABNORMAL LOW (ref >60)
Glucose, Bld: 169 mg/dL — ABNORMAL HIGH (ref 70–99)
Potassium: 3.8 mmol/L (ref 3.5–5.1)
Sodium: 131 mmol/L — ABNORMAL LOW (ref 135–145)

## 2023-08-03 LAB — MAGNESIUM: Magnesium: 1.6 mg/dL — ABNORMAL LOW (ref 1.7–2.4)

## 2023-08-03 LAB — COMPREHENSIVE METABOLIC PANEL WITH GFR
ALT: 23 U/L (ref 0–44)
AST: 196 U/L — ABNORMAL HIGH (ref 15–41)
Albumin: 2.5 g/dL — ABNORMAL LOW (ref 3.5–5.0)
Alkaline Phosphatase: 284 U/L — ABNORMAL HIGH (ref 38–126)
Anion gap: 15 (ref 5–15)
BUN: 44 mg/dL — ABNORMAL HIGH (ref 8–23)
CO2: 18 mmol/L — ABNORMAL LOW (ref 22–32)
Calcium: 7 mg/dL — ABNORMAL LOW (ref 8.9–10.3)
Chloride: 93 mmol/L — ABNORMAL LOW (ref 98–111)
Creatinine, Ser: 5.99 mg/dL — ABNORMAL HIGH (ref 0.44–1.00)
GFR, Estimated: 7 mL/min — ABNORMAL LOW (ref >60)
Glucose, Bld: 280 mg/dL — ABNORMAL HIGH (ref 70–99)
Potassium: 3.9 mmol/L (ref 3.5–5.1)
Sodium: 126 mmol/L — ABNORMAL LOW (ref 135–145)
Total Bilirubin: 2 mg/dL — ABNORMAL HIGH (ref 0.0–1.2)
Total Protein: 5.6 g/dL — ABNORMAL LOW (ref 6.5–8.1)

## 2023-08-03 LAB — BPAM RBC
Blood Product Expiration Date: 202508102359
ISSUE DATE / TIME: 202507161441
Unit Type and Rh: 6200

## 2023-08-03 LAB — PHOSPHORUS
Phosphorus: 1.1 mg/dL — ABNORMAL LOW (ref 2.5–4.6)
Phosphorus: 2.9 mg/dL (ref 2.5–4.6)

## 2023-08-03 LAB — LACTIC ACID, PLASMA: Lactic Acid, Venous: 2.8 mmol/L (ref 0.5–1.9)

## 2023-08-03 MED ORDER — SODIUM PHOSPHATES 45 MMOLE/15ML IV SOLN
30.0000 mmol | Freq: Once | INTRAVENOUS | Status: AC
  Administered 2023-08-03: 30 mmol via INTRAVENOUS
  Filled 2023-08-03: qty 10

## 2023-08-03 MED ORDER — SODIUM CHLORIDE 0.9 % IV SOLN
12.5000 mg | Freq: Three times a day (TID) | INTRAVENOUS | Status: DC | PRN
  Administered 2023-08-03: 12.5 mg via INTRAVENOUS
  Filled 2023-08-03 (×2): qty 0.5

## 2023-08-03 MED ORDER — ALBUTEROL SULFATE (2.5 MG/3ML) 0.083% IN NEBU
2.5000 mg | INHALATION_SOLUTION | RESPIRATORY_TRACT | Status: DC | PRN
  Administered 2023-08-03 – 2023-08-05 (×4): 2.5 mg via RESPIRATORY_TRACT
  Filled 2023-08-03 (×4): qty 3

## 2023-08-03 MED ORDER — MAGNESIUM SULFATE 2 GM/50ML IV SOLN
2.0000 g | Freq: Once | INTRAVENOUS | Status: AC
  Administered 2023-08-03: 2 g via INTRAVENOUS

## 2023-08-03 MED ORDER — LACTATED RINGERS IV BOLUS
1000.0000 mL | Freq: Once | INTRAVENOUS | Status: AC
  Administered 2023-08-03: 1000 mL via INTRAVENOUS

## 2023-08-03 MED ORDER — CALCIUM GLUCONATE-NACL 1-0.675 GM/50ML-% IV SOLN
1.0000 g | Freq: Once | INTRAVENOUS | Status: AC
  Administered 2023-08-03: 1000 mg via INTRAVENOUS
  Filled 2023-08-03: qty 50

## 2023-08-03 NOTE — Progress Notes (Signed)
 [facility] Progress Note  Name: Shelia Chan MRN: [id number] DOB: 1956/01/12  Chief Complaint:  Sent from clinic with low BP  Subjective:  She was moved to the ICU for hypotension.  Had 300 mL UOP over 7/16.  Feels ok today.  Has been on 3 liters oxygen.  Nurse is emptying her urine. She has gotten two boluses today  Review of systems:  Some shortness of breath  Some nausea  No chest pain  Making urine  ------------ Background on consult:  Shelia Chan is a 68 y.o. female with a history of chronic diastolic CHF, COPD, CKD, neuroendocrine carcinoma of the lung, as well as urethral carcinoma status post cystectomy who presented to the hospital from the cancer center due to hypotension.  Her chemotherapy was canceled in response to the hypotension.  She has had nausea with vomiting for the past week.  Creatinine trends are demonstrated below.  Her creatinine has most recently been in the mid to high twos and was 3 on 07/17/2023.  On 7/14 creatinine was noted to be 6.35.  Creatinine is elevated but stable at 6.37 on the date of consult, 7/16.  She has been on fluids with bicarbonate.  This AM was transitioned to bicarb gtt on consult.  She has had 80 mL UOP over 7/15 charted.  CT on 7/14 with bilateral ureteral stents.  Spoke with the patient - she follows with [facility] - Dr. Macel.  She is not sure what she would want to do if her kidneys were to worsen.  Her family is at bedside.  She has been hypotensive today - at one point today was in the 70's systolic.  Improved to the 80's but still low.  Updated her family at bedside.      Intake/Output Summary (Last 24 hours) at 08/03/2023 1552 Last data filed at 08/03/2023 1114 Gross per 24 hour  Intake 4267.33 ml  Output 300 ml  Net 3967.33 ml    Vitals:  Vitals:   08/03/23 1200 08/03/23 1300 08/03/23 1400 08/03/23 1500  BP: (!) 105/48 (!) 112/52 101/66 (!) 106/47  Pulse: 77 82 86 80  Resp: 11 11 19  11   Temp: 97.7 F (36.5 C)     TempSrc: Oral     SpO2: 94% 92% 95% 91%  Weight:      Height:         Physical Exam:  General: elderly female in bed in NAD   HEENT: NCAT Eyes: EOMI sclera anicteric Neck: supple trachea midline  Heart: S1S2 no rub Lungs: occ crackles and unlabored; normal work of breathing at rest on 3 liters; wet cough Abdomen: soft/nt/nd;  ileal conduit Extremities: 1-2+ edema lower extremities Skin: no rash on extremities exposed  Neuro: alert and oriented x 3 provides hx and follows commands Psych: normal mood and affect GU - no foley - ileal conduit  Medications reviewed   Labs:     Latest Ref Rng & Units 08/03/2023    6:25 AM 08/02/2023    9:06 PM 08/02/2023    3:38 AM  BMP  Glucose 70 - 99 mg/dL 719  851  884   BUN 8 - 23 mg/dL 44  45  45   Creatinine 0.44 - 1.00 mg/dL 4.00  3.79  3.62   Sodium 135 - 145 mmol/L 126  131  127   Potassium 3.5 - 5.1 mmol/L 3.9  3.9  4.0   Chloride 98 - 111 mmol/L 93  98  100   CO2 22 - 32 mmol/L 18  18  13    Calcium  8.9 - 10.3 mg/dL 7.0  7.4  7.0      Assessment/Plan:   # AKI - Secondary to ischemic and prerenal and ischemic insults in the setting of hypotension as well as nausea/decreased PO intake.  Complicated urologic history and s/p cystectomy.  Has bilateral renal stents on CT and on today's renal ultrasound has no hydronephrosis.  Note that the left kidney is atrophic so decreased renal reserve - I have started midodrine  - will stop bicarb gtt - optimize hypotension as below - appreciate pulm    # Metabolic acidosis - on bicarb drip - will stop for now    # Hypotension - continue midodrine  - Continue bicarb as above - she is on levophed      # Hyponatremia  - May be pre-renal  - improves with correction for glucose - some component of pseudohyponatremia  - Follow with fluids and with treatment of AKI    # Hypocalcemia  - improves with correction for albumin  but is on bicarb gtt now  - replete  calcium     # Normocytic Anemia  - Suspect contribution of CKD as well as malignancy -PRBCs per primary team  # Hypophosphatemia  - repleted this AM - repeat phos   Would continue ICU monitoring   Katheryn [name] Saba, MD 08/03/2023 4:13 PM

## 2023-08-03 NOTE — Plan of Care (Signed)
 Administered electrolyte replacements and LR boluses (2 liters total), however Shelia Chan has needed increasing vasopressor support despite PO midodrine  TID.  Sodium Bicarb infusion discontinued by nephrology due to concern for fluid overload.  Placed on 2L [name] due to marginal sats, later increased to 3L.

## 2023-08-03 NOTE — Progress Notes (Signed)
 NAME:  Shelia Chan, MRN:  [id number], DOB:  1955-09-19, LOS: 2 ADMISSION DATE:  07/31/2023, CONSULTATION DATE: 08/02/2023 REFERRING MD: Dr Arlice CHIEF COMPLAINT: Hypovolemic shock  History of Present Illness:  68 year old woman with a history of extensive stage small cell lung cancer diagnosed 06/2023, urothelial cancer with cystectomy, ilial conduit and ureteral stenting.  Also with COPD/asthma, CKD stage IV, plan for chemotherapy to initiate 07/31/2023.  On arrival for that visit she was noted to be hypotensive, started on IV fluids.  Also with bilateral lower extremity edema.  Her lab work revealed a creatinine of 6.35 (up from 3.36 10 days prior).  She has received maintenance IV fluids, no fluid boluses.  She received 1 unit PRBC on 7/16 for hemoglobin 7.8.  Abx have been deferred.  SBP has remained in the high 80s.  She is on room air.  She moved now to stepdown/ICU for further care.   Pertinent  Medical History   Past Medical History:  Diagnosis Date   Arthritis    Asthma    Blood transfusion without reported diagnosis    Cancer (HCC)    CHF (congestive heart failure) (HCC)    COPD (chronic obstructive pulmonary disease) (HCC)    Heart murmur    Immune deficiency disorder (HCC)    Renal disorder      Significant Hospital Events: Including procedures, antibiotic start and stop dates in addition to other pertinent events   7/14 CT abdomen > hepatomegaly with metastatic disease throughout the liver, no hypermetabolic retroperitoneal and porta hepatis lymph nodes, mild right and prominent left renal atrophy with ureteral stents bilaterally in the proximal ureters, bibasilar cylindrical bronchiectasis  Interim History / Subjective:   I/O+ 3.7 L total Norepinephrine  at 12 Urine output improved to 300 cc last 24 hours SCr peaked at 6.37, 5.99 this morning.  CO2 18 Na 126, Phos 1.1, magnesium  1.6 Hgb 9.8 Lactic acid 2.3 > 2.8 Some abdominal pain especially with cough,  cough is dry   Objective    Blood pressure (!) 106/47, pulse 77, temperature 97.7 F (36.5 C), temperature source Oral, resp. rate 13, height 5' 1 (1.549 m), weight 64.5 kg, SpO2 93%.        Intake/Output Summary (Last 24 hours) at 08/03/2023 0745 Last data filed at 08/03/2023 0741 Gross per 24 hour  Intake 2575.07 ml  Output 350 ml  Net 2225.07 ml   Filed Weights   08/01/23 1054 08/02/23 1900  Weight: 58.6 kg 64.5 kg    Examination: General: Thin woman laying in bed, better color today HENT: Oropharynx still a bit dry Lungs: Clear Cardiovascular: Hyperdynamic, no murmur Abdomen: Nondistended, mild diffuse tenderness, positive bowel sounds Extremities: 1+ pretibial edema Neuro: Awake and alert, nonfocal  Resolved problem list   Assessment and Plan   Shock, presumed hypovolemic.  Consider septic shock: No clear source identified -Following lactic acid, most recent 2.8 - Wean norepinephrine  as able - Repeat IV fluid bolus today 7/17 and follow I's/O, urine output - Blood cultures, chest x-ray - Defer antibiotics but low threshold to start depending on clinical course - Will place on PCCM service while on pressors  Acute on chronic stage IV renal failure due to hypoperfusion, ATN superimposed on a chronic disease -Improving urine output in serum creatinine, stable CO2 - Continue bicarbonate drip for now, reassess after next BMP - Work to restore adequate perfusion, treat shock state - She has extensive stage small cell lung cancer which makes her questionable candidate for  HD.  Certainly questionable for long-term therapy, question whether she might be appropriate for short-term.  Hopefully we can avoid this and she will improve with medical therapy  Small cell lung cancer, extensive stage - Has not yet been treated and has extensive metastatic disease.  Again we will have to discuss goals for care.  She was not in any shape to undergo chemotherapy as planned on  07/31/2023.  She would need to stabilize significantly, strengthen significantly in order to tolerate -DNR status confirmed  History of urothelial cell carcinoma post cystectomy, ileal conduit, ureteral stenting. - Will consider involving IR or urology if UA suggests complicated UTI.  She was due for exchange of her ureteral stents in coming weeks.  COPD/asthma - Albuterol  as needed  Best Practice (right click and Reselect all SmartList Selections daily)   Diet/type: full liquids  DVT prophylaxis prophylactic heparin   Pressure ulcer(s): N/A GI prophylaxis: N/A Lines: Central line Foley:  Yes, and it is still needed >> port Code Status:  DNR Last date of multidisciplinary goals of care discussion [pending]  Labs   CBC: Recent Labs  Lab 07/31/23 1152 08/01/23 1333 08/02/23 0338 08/02/23 2106 08/03/23 0625  WBC 13.3* 10.1 9.1 10.9* 13.7*  NEUTROABS 11.7*  --  7.4  --  10.1*  HGB 9.7* 8.2* 7.8* 9.3* 9.8*  HCT 28.1* 25.2* 23.3* 27.9* 28.9*  MCV 88.1 94.0 91.7 87.5 87.3  PLT 321 244 230 285 338    Basic Metabolic Panel: Recent Labs  Lab 07/31/23 1152 08/01/23 1333 08/02/23 0338 08/02/23 2106 08/03/23 0625  NA 128* 129* 127* 131* 126*  K 4.9 4.6 4.0 3.9 3.9  CL 99 102 100 98 93*  CO2 15* 10* 13* 18* 18*  GLUCOSE 101* 102* 115* 148* 280*  BUN 43* 45* 45* 45* 44*  CREATININE 6.35* 6.28* 6.37* 6.20* 5.99*  CALCIUM  8.4* 7.5* 7.0* 7.4* 7.0*  MG  --   --   --   --  1.6*  PHOS  --   --   --   --  1.1*   GFR: Estimated Creatinine Clearance: 7.6 mL/min (A) (by C-G formula based on SCr of 5.99 mg/dL (H)). Recent Labs  Lab 08/01/23 1333 08/02/23 0338 08/02/23 2106 08/03/23 0625  WBC 10.1 9.1 10.9* 13.7*  LATICACIDVEN  --   --  2.3* 2.8*    Liver Function Tests: Recent Labs  Lab 07/31/23 1152 08/01/23 1333 08/03/23 0625  AST 90* 99* 196*  ALT 9 12 23   ALKPHOS 285* 244* 284*  BILITOT 1.3* 2.1* 2.0*  PROT 6.5 5.8* 5.6*  ALBUMIN  3.4* 2.7* 2.5*   No results  for input(s): LIPASE, AMYLASE in the last 168 hours. No results for input(s): AMMONIA in the last 168 hours.  ABG    Component Value Date/Time   TCO2 22 12/29/2022 1518     Coagulation Profile: No results for input(s): INR, PROTIME in the last 168 hours.  Cardiac Enzymes: No results for input(s): CKTOTAL, CKMB, CKMBINDEX, TROPONINI in the last 168 hours.  HbA1C: No results found for: HGBA1C  CBG: No results for input(s): GLUCAP in the last 168 hours.   Critical care time: 32 minutes      Lamar Chris, MD, PhD 08/03/2023, 7:45 AM [facility] [phone] or if no answer before 7:00PM call [phone] For any issues after 7:00PM please call eLink [phone]

## 2023-08-03 NOTE — Plan of Care (Signed)
  Problem: Clinical Measurements: Goal: Respiratory complications will improve Outcome: Progressing Goal: Cardiovascular complication will be avoided Outcome: Progressing   Problem: Coping: Goal: Level of anxiety will decrease Outcome: Progressing   Problem: Elimination: Goal: Will not experience complications related to urinary retention Outcome: Progressing   Problem: Pain Managment: Goal: General experience of comfort will improve and/or be controlled Outcome: Progressing   Problem: Skin Integrity: Goal: Risk for impaired skin integrity will decrease Outcome: Progressing

## 2023-08-04 ENCOUNTER — Other Ambulatory Visit: Payer: Self-pay

## 2023-08-04 ENCOUNTER — Inpatient Hospital Stay

## 2023-08-04 ENCOUNTER — Inpatient Hospital Stay (HOSPITAL_COMMUNITY)

## 2023-08-04 DIAGNOSIS — C7A8 Other malignant neuroendocrine tumors: Secondary | ICD-10-CM

## 2023-08-04 DIAGNOSIS — C3490 Malignant neoplasm of unspecified part of unspecified bronchus or lung: Secondary | ICD-10-CM

## 2023-08-04 DIAGNOSIS — N179 Acute kidney failure, unspecified: Secondary | ICD-10-CM | POA: Diagnosis not present

## 2023-08-04 DIAGNOSIS — J45909 Unspecified asthma, uncomplicated: Secondary | ICD-10-CM

## 2023-08-04 DIAGNOSIS — R579 Shock, unspecified: Secondary | ICD-10-CM | POA: Diagnosis not present

## 2023-08-04 DIAGNOSIS — E8729 Other acidosis: Secondary | ICD-10-CM | POA: Diagnosis not present

## 2023-08-04 DIAGNOSIS — D518 Other vitamin B12 deficiency anemias: Secondary | ICD-10-CM

## 2023-08-04 DIAGNOSIS — E871 Hypo-osmolality and hyponatremia: Secondary | ICD-10-CM

## 2023-08-04 DIAGNOSIS — Z8589 Personal history of malignant neoplasm of other organs and systems: Secondary | ICD-10-CM

## 2023-08-04 DIAGNOSIS — C7A1 Malignant poorly differentiated neuroendocrine tumors: Secondary | ICD-10-CM

## 2023-08-04 DIAGNOSIS — R935 Abnormal findings on diagnostic imaging of other abdominal regions, including retroperitoneum: Secondary | ICD-10-CM

## 2023-08-04 DIAGNOSIS — N184 Chronic kidney disease, stage 4 (severe): Secondary | ICD-10-CM | POA: Diagnosis not present

## 2023-08-04 LAB — BLOOD GAS, ARTERIAL
Acid-base deficit: 9.4 mmol/L — ABNORMAL HIGH (ref 0.0–2.0)
Bicarbonate: 14.9 mmol/L — ABNORMAL LOW (ref 20.0–28.0)
Drawn by: 20012
FIO2: 8 %
O2 Saturation: 95.5 %
Patient temperature: 36.6
pCO2 arterial: 27 mmHg — ABNORMAL LOW (ref 32–48)
pH, Arterial: 7.36 (ref 7.35–7.45)
pO2, Arterial: 67 mmHg — ABNORMAL LOW (ref 83–108)

## 2023-08-04 LAB — BASIC METABOLIC PANEL WITH GFR
Anion gap: 17 — ABNORMAL HIGH (ref 5–15)
BUN: 43 mg/dL — ABNORMAL HIGH (ref 8–23)
CO2: 17 mmol/L — ABNORMAL LOW (ref 22–32)
Calcium: 7 mg/dL — ABNORMAL LOW (ref 8.9–10.3)
Chloride: 93 mmol/L — ABNORMAL LOW (ref 98–111)
Creatinine, Ser: 5.83 mg/dL — ABNORMAL HIGH (ref 0.44–1.00)
GFR, Estimated: 7 mL/min — ABNORMAL LOW (ref >60)
Glucose, Bld: 157 mg/dL — ABNORMAL HIGH (ref 70–99)
Potassium: 4 mmol/L (ref 3.5–5.1)
Sodium: 127 mmol/L — ABNORMAL LOW (ref 135–145)

## 2023-08-04 LAB — CBC
HCT: 28.9 % — ABNORMAL LOW (ref 36.0–46.0)
Hemoglobin: 9.7 g/dL — ABNORMAL LOW (ref 12.0–15.0)
MCH: 29.4 pg (ref 26.0–34.0)
MCHC: 33.6 g/dL (ref 30.0–36.0)
MCV: 87.6 fL (ref 80.0–100.0)
Platelets: 304 K/uL (ref 150–400)
RBC: 3.3 MIL/uL — ABNORMAL LOW (ref 3.87–5.11)
RDW: 16.9 % — ABNORMAL HIGH (ref 11.5–15.5)
WBC: 13.2 K/uL — ABNORMAL HIGH (ref 4.0–10.5)
nRBC: 1.7 % — ABNORMAL HIGH (ref 0.0–0.2)

## 2023-08-04 LAB — MAGNESIUM: Magnesium: 1.8 mg/dL (ref 1.7–2.4)

## 2023-08-04 LAB — PHOSPHORUS: Phosphorus: 2.8 mg/dL (ref 2.5–4.6)

## 2023-08-04 MED ORDER — ENSURE PLUS HIGH PROTEIN PO LIQD
237.0000 mL | Freq: Two times a day (BID) | ORAL | Status: DC
  Administered 2023-08-04: 237 mL via ORAL

## 2023-08-04 MED ORDER — FUROSEMIDE 10 MG/ML IJ SOLN
80.0000 mg | Freq: Once | INTRAMUSCULAR | Status: AC
  Administered 2023-08-04: 80 mg via INTRAVENOUS
  Filled 2023-08-04: qty 8

## 2023-08-04 MED ORDER — SODIUM CHLORIDE 0.9 % IV SOLN
2.0000 g | INTRAVENOUS | Status: DC
  Administered 2023-08-04 – 2023-08-05 (×2): 2 g via INTRAVENOUS
  Filled 2023-08-04 (×2): qty 20

## 2023-08-04 MED ORDER — MIDODRINE HCL 5 MG PO TABS
15.0000 mg | ORAL_TABLET | Freq: Three times a day (TID) | ORAL | Status: DC
  Administered 2023-08-04 – 2023-08-05 (×3): 15 mg via ORAL
  Filled 2023-08-04 (×3): qty 3

## 2023-08-04 MED ORDER — SODIUM CHLORIDE 0.9 % IV SOLN
500.0000 mg | INTRAVENOUS | Status: DC
  Administered 2023-08-04: 500 mg via INTRAVENOUS
  Filled 2023-08-04 (×2): qty 5

## 2023-08-04 MED ORDER — CALCIUM GLUCONATE-NACL 1-0.675 GM/50ML-% IV SOLN
1.0000 g | Freq: Once | INTRAVENOUS | Status: AC
  Administered 2023-08-04: 1000 mg via INTRAVENOUS
  Filled 2023-08-04: qty 50

## 2023-08-04 MED ORDER — NOREPINEPHRINE 16 MG/250ML-% IV SOLN
0.0000 ug/min | INTRAVENOUS | Status: DC
  Administered 2023-08-04: 21 ug/min via INTRAVENOUS
  Administered 2023-08-05: 10 ug/min via INTRAVENOUS
  Administered 2023-08-05: 23 ug/min via INTRAVENOUS
  Filled 2023-08-04 (×3): qty 250

## 2023-08-04 NOTE — Progress Notes (Signed)
 [facility] Progress Note  Name: Shelia Chan MRN: [id number] DOB: 18-Mar-1955  Chief Complaint:  Sent from clinic with low BP  Subjective:  She had 400 mL UOP over 7/17.  She has been in the ICU.  She has been on 8 liters/min of oxygen.  She has had an increased work of breathing this afternoon.  She is on levo at 21 mcg/min.  I spoke with her daughter Shelia Chan via phone.  Quite a while back (even before the cancer diagnosis) she states the patient thought that she might want to do dialysis should her CKD progress.  However, she has had a decline in her health more recently.  Previous nursing staff shares that she had told him that she wouldn't ever want dialysis which her daughter thinks is in keeping with what she would want now.  Her daughter does not want to pursue dialysis - she would want her mother to be more comfortable if her clinical status deteriorates.  She is currently already DNR/DNI.  Critical care PA spoke with her via phone, as well.   Review of systems:    Limited secondary to some confusion  She reports shortness of breath  ------------ Background on consult:  Shelia Chan is a 68 y.o. female with a history of chronic diastolic CHF, COPD, CKD, neuroendocrine carcinoma of the lung, as well as urethral carcinoma status post cystectomy who presented to the hospital from the cancer center due to hypotension.  Her chemotherapy was canceled in response to the hypotension.  She has had nausea with vomiting for the past week.  Creatinine trends are demonstrated below.  Her creatinine has most recently been in the mid to high twos and was 3 on 07/17/2023.  On 7/14 creatinine was noted to be 6.35.  Creatinine is elevated but stable at 6.37 on the date of consult, 7/16.  She has been on fluids with bicarbonate.  This AM was transitioned to bicarb gtt on consult.  She has had 80 mL UOP over 7/15 charted.  CT on 7/14 with bilateral ureteral stents.  Spoke with the  patient - she follows with Shelia Chan - Shelia Chan.  She is not sure what she would want to do if her kidneys were to worsen.  Her family is at bedside.  She has been hypotensive today - at one point today was in the 70's systolic.  Improved to the 80's but still low.  Updated her family at bedside.      Intake/Output Summary (Last 24 hours) at 08/04/2023 1804 Last data filed at 08/04/2023 1654 Gross per 24 hour  Intake 2296.15 ml  Output 225 ml  Net 2071.15 ml    Vitals:  Vitals:   08/04/23 1400 08/04/23 1500 08/04/23 1600 08/04/23 1603  BP: (!) 111/48 (!) 114/42 (!) 112/49   Pulse: 94 94 94 98  Resp: 15 16 (!) 21 20  Temp:   97.9 F (36.6 C)   TempSrc:   Oral   SpO2: 94% 94% 91% 94%  Weight:      Height:          Physical Exam:    General: elderly female in bed in NAD   HEENT: NCAT Eyes: EOMI sclera anicteric Neck: supple trachea midline  Heart: S1S2 no rub Lungs: crackles and wet cough; increased work of breathing with exertion on 8 liters oxygen Abdomen: soft/nt/nd;  ileal conduit Extremities: 1-2+ edema lower extremities Skin: no rash on extremities exposed  Neuro: alert and  oriented x 3 provides hx and follows commands Psych: normal mood and affect GU - no foley - ileal conduit   Medications reviewed    Labs:     Latest Ref Rng & Units 08/04/2023    6:16 AM 08/03/2023    4:18 PM 08/03/2023    6:25 AM  BMP  Glucose 70 - 99 mg/dL 842  830  719   BUN 8 - 23 mg/dL 43  42  44   Creatinine 0.44 - 1.00 mg/dL 4.16  4.90  4.00   Sodium 135 - 145 mmol/L 127  131  126   Potassium 3.5 - 5.1 mmol/L 4.0  3.8  3.9   Chloride 98 - 111 mmol/L 93  95  93   CO2 22 - 32 mmol/L 17  17  18    Calcium  8.9 - 10.3 mg/dL 7.0  6.8  7.0      Assessment/Plan:    # AKI - Secondary to ischemic and prerenal insults in the setting of hypotension as well as nausea/decreased PO intake.  Favor more of an ATN component and this may take time to improve.  Complicated urologic  history and s/p cystectomy.  Has bilateral renal stents on CT and on today's renal ultrasound has no hydronephrosis.  Note that the left Chan is atrophic so decreased renal reserve - Her daughter has elected to pursue conservative Chan management - no dialysis. Should renal function worsen she would want more of a comfort-focused plan  - optimize hypotension as below   # CKD stage IV  - Baseline Cr 3 - Follows with Shelia [facility] at [facility]     # Metabolic acidosis - Secondary to AKI and shock   # Septic shock - Continue midodrine  - Pressors per primary team  - Antibiotics per primary team    # Hyponatremia  - Multifactorial - may be pre-renal  - Follow with fluids and with treatment of AKI    # Hypocalcemia  - Calcium  gluconate x 1    # Normocytic Anemia  - Suspect contribution of CKD as well as malignancy - PRBCs per primary team  # Hypophosphatemia  - improved with repletion  Disposition - continue ICU monitoring     Shelia [name] Saba, MD 08/04/2023 7:04 PM

## 2023-08-04 NOTE — Progress Notes (Signed)
 NAME:  Shelia Chan, MRN:  [id number], DOB:  [birth year], LOS: 3 ADMISSION DATE:  07/31/2023, CONSULTATION DATE: 08/02/2023 REFERRING MD: Arlice - TRH, CHIEF COMPLAINT: Hypovolemic shock  History of Present Illness:  68 year old woman with a history of extensive stage small cell lung cancer diagnosed 06/2023, urothelial cancer with cystectomy, ilial conduit and ureteral stenting.  Also with COPD/asthma, CKD stage IV, plan for chemotherapy to initiate 07/31/2023.  On arrival for that visit she was noted to be hypotensive, started on IV fluids.  Also with bilateral lower extremity edema.  Her lab work revealed a creatinine of 6.35 (up from 3.36 10 days prior).  She has received maintenance IV fluids, no fluid boluses.  She received 1 unit PRBC on 7/16 for hemoglobin 7.8.  Abx have been deferred.  SBP has remained in the high 80s.  She is on room air.  She moved now to stepdown/ICU for further care.  Pertinent Medical History:   Past Medical History:  Diagnosis Date   Arthritis    Asthma    Blood transfusion without reported diagnosis    Cancer (HCC)    CHF (congestive heart failure) (HCC)    COPD (chronic obstructive pulmonary disease) (HCC)    Heart murmur    Immune deficiency disorder (HCC)    Renal disorder    Significant Hospital Events: Including procedures, antibiotic start and stop dates in addition to other pertinent events   7/14 CT Abdomen > hepatomegaly with metastatic disease throughout the liver, no hypermetabolic retroperitoneal and porta hepatis lymph nodes, mild right and prominent left renal atrophy with ureteral stents bilaterally in the proximal ureters, bibasilar cylindrical bronchiectasis  Interim History / Subjective:  No significant events overnight Reports not feeling well today More SOB, generalized abdominal pain WBC stably elevated Persistently high vasopressor requirements (NE 20mcg) Obtaining CXR/AXR Cr rising, UOP poor, Nephro following  Objective:    Blood pressure (!) 113/52, pulse 98, temperature 98 F (36.7 C), temperature source Oral, resp. rate 18, height 5' 1 (1.549 m), weight 64.5 kg, SpO2 94%.        Intake/Output Summary (Last 24 hours) at 08/04/2023 0732 Last data filed at 08/04/2023 0700 Gross per 24 hour  Intake 4842.93 ml  Output 400 ml  Net 4442.93 ml   Filed Weights   08/01/23 1054 08/02/23 1900  Weight: 58.6 kg 64.5 kg   Physical Examination: General: Chronically ill-appearing older woman in NAD. HEENT: [place]/AT, anicteric sclera, PERRL 3mm, moist mucous membranes. Neuro: Awake, oriented x 3-4, intermittently dozes off. Responds to verbal stimuli. Following commands consistently. Moves all 4 extremities spontaneously. Generalized weakness. CV: RRR, no m/g/r. PULM: Breathing even and unlabored on 8L [place]. Lung fields coarse with rhonchi, ?fluid versus infection. GI: Soft, mildly distended, generalized TTP, worst around urostomy. Urostomy present with yllow urine in bag, stent x 2. Hypoactive bowel sounds. Extremities: Bilateral symmetric 2+ pitting LE edema noted. Skin: Warm/dry, no rashes. Mild erythema of BLE.  Resolved Problem List:   Assessment and Plan:  Undifferentiated shock, presumed hypovolemic; consider component of sepsis, though no clear source identified Lactic acidosis - Goal MAP > 65 - Limited fluid resuscitation in the setting of volume overload/renal failure - Levophed  titrated to goal MAP - Continue midodrine , increase dose to 15mg  TID - F/u AM Cortisol, r/o AI - Trend WBC, fever curve, LA - F/u Cx data - Continue broad-spectrum antibiotics (ceftriaxone /azithromycin)  Acute on chronic stage IV renal failure due to hypoperfusion, ATN superimposed on chronic disease Oliguria - Nephro  following, appreciate recommendations - Trend BMP, phos, Mg - Replete electrolytes as indicated - Bicarb gtt discontinued per Nephro - Monitor I&Os/UOP (oliguric) - F/u urine studies - Avoid nephrotoxic  agents as able - Ensure adequate renal perfusion - Unfortunately, [place] is likely not a good dialysis candidate in the setting of extensive SCLC; would defer to Nephro re: candidacy for short term treatment versus non-candidacy  Abdominal pain, generalized CT Abdomen 7/14 with persistent hepatomegaly, mostly hypodense lesions c/w hypermetabolic hepatic metastases, known hypermetabolic RP/porta hepatis LN, renal atrophy with ureteral stents in place via ileal conduit, known osseous metastatic disease. - AXR nonfocal/grossly unremarkable - Bowel regimen in place - Low threshold for additional abdominal imaging (repeat CT) if worsening clinical status or ongoing unclear source of ?sepsis  Small cell lung cancer, extensive stage Not yet treated; has extensive metastatic disease. She was not in any shape to undergo chemotherapy as planned on 07/31/2023.  She would need to stabilize significantly, strengthen significantly in order to tolerate. - Ongoing GOC discussions - DNR/DNI code status confirmed  History of urothelial cell carcinoma post cystectomy, ileal conduit, ureteral stenting. Undergoes Q 4-6 week ureteral stent exchanges with IR.  - Due for exchange 7/24 - Low threshold for IR/Uro involvement if concern for source is urologic in nature - Continue to monitor - Broad-spectrum antibiotics as above, follow Cx  COPD/asthma CT Abd 7/14 notes cylindrical bronchiectasis in BLL and mild scarring vs. atelectasis of bilateral bases. - Supplemental O2 support for goal O2 sat > 88% - Bronchodilators as needed - Pulmonary hygiene  Best Practice: (right click and Reselect all SmartList Selections daily)   Diet/type: full liquids  DVT prophylaxis prophylactic heparin   Pressure ulcer(s): N/A GI prophylaxis: N/A Lines: Central line Foley:  Yes, and it is still needed >> port Code Status:  DNR Last date of multidisciplinary goals of care discussion [pending]  Critical care time:    The  patient is critically ill with multiple organ system failure and requires high complexity decision making for assessment and support, frequent evaluation and titration of therapies, advanced monitoring, review of radiographic studies and interpretation of complex data.   Critical Care Time devoted to patient care services, exclusive of separately billable procedures, described in this note is 37 minutes.  Corean CHRISTELLA Teila Skalsky, PA-C [facility] 08/04/23 7:32 AM  Please see [email or web address] for pager details.  From 7A-7P if no response, please call [phone] After hours, please call ELink [phone]

## 2023-08-04 NOTE — Plan of Care (Signed)
 Work of breathing and oxygen demand increased from yesterday to 8L [place].  Unable to titrate Levophed  below 20 mcg/min for more than an hour.

## 2023-08-05 ENCOUNTER — Inpatient Hospital Stay (HOSPITAL_COMMUNITY)

## 2023-08-05 DIAGNOSIS — N184 Chronic kidney disease, stage 4 (severe): Secondary | ICD-10-CM | POA: Diagnosis not present

## 2023-08-05 DIAGNOSIS — E8729 Other acidosis: Secondary | ICD-10-CM | POA: Diagnosis not present

## 2023-08-05 DIAGNOSIS — R579 Shock, unspecified: Secondary | ICD-10-CM | POA: Diagnosis not present

## 2023-08-05 DIAGNOSIS — N179 Acute kidney failure, unspecified: Secondary | ICD-10-CM | POA: Diagnosis not present

## 2023-08-05 DIAGNOSIS — A419 Sepsis, unspecified organism: Secondary | ICD-10-CM

## 2023-08-05 DIAGNOSIS — I219 Acute myocardial infarction, unspecified: Secondary | ICD-10-CM

## 2023-08-05 LAB — CBC
HCT: 28.6 % — ABNORMAL LOW (ref 36.0–46.0)
Hemoglobin: 9.3 g/dL — ABNORMAL LOW (ref 12.0–15.0)
MCH: 28.7 pg (ref 26.0–34.0)
MCHC: 32.5 g/dL (ref 30.0–36.0)
MCV: 88.3 fL (ref 80.0–100.0)
Platelets: 297 K/uL (ref 150–400)
RBC: 3.24 MIL/uL — ABNORMAL LOW (ref 3.87–5.11)
RDW: 17 % — ABNORMAL HIGH (ref 11.5–15.5)
WBC: 15 K/uL — ABNORMAL HIGH (ref 4.0–10.5)
nRBC: 2.7 % — ABNORMAL HIGH (ref 0.0–0.2)

## 2023-08-05 LAB — COMPREHENSIVE METABOLIC PANEL WITH GFR
ALT: 64 U/L — ABNORMAL HIGH (ref 0–44)
AST: 573 U/L — ABNORMAL HIGH (ref 15–41)
Albumin: 2.4 g/dL — ABNORMAL LOW (ref 3.5–5.0)
Alkaline Phosphatase: 282 U/L — ABNORMAL HIGH (ref 38–126)
Anion gap: 23 — ABNORMAL HIGH (ref 5–15)
BUN: 47 mg/dL — ABNORMAL HIGH (ref 8–23)
CO2: 12 mmol/L — ABNORMAL LOW (ref 22–32)
Calcium: 7.2 mg/dL — ABNORMAL LOW (ref 8.9–10.3)
Chloride: 91 mmol/L — ABNORMAL LOW (ref 98–111)
Creatinine, Ser: 6.47 mg/dL — ABNORMAL HIGH (ref 0.44–1.00)
GFR, Estimated: 6 mL/min — ABNORMAL LOW (ref >60)
Glucose, Bld: 115 mg/dL — ABNORMAL HIGH (ref 70–99)
Potassium: 4.9 mmol/L (ref 3.5–5.1)
Sodium: 126 mmol/L — ABNORMAL LOW (ref 135–145)
Total Bilirubin: 2.3 mg/dL — ABNORMAL HIGH (ref 0.0–1.2)
Total Protein: 5.7 g/dL — ABNORMAL LOW (ref 6.5–8.1)

## 2023-08-05 LAB — MAGNESIUM: Magnesium: 1.9 mg/dL (ref 1.7–2.4)

## 2023-08-05 LAB — TROPONIN I (HIGH SENSITIVITY)
Troponin I (High Sensitivity): 769 ng/L (ref <18)
Troponin I (High Sensitivity): 3310 ng/L (ref <18)
Troponin I (High Sensitivity): 5663 ng/L (ref <18)

## 2023-08-05 LAB — CORTISOL: Cortisol, Plasma: 39.8 ug/dL

## 2023-08-05 LAB — PHOSPHORUS: Phosphorus: 4 mg/dL (ref 2.5–4.6)

## 2023-08-05 MED ORDER — MORPHINE 100MG IN NS 100ML (1MG/ML) PREMIX INFUSION
0.0000 mg/h | INTRAVENOUS | Status: DC
  Administered 2023-08-05: 1 mg/h via INTRAVENOUS
  Administered 2023-08-05: 12 mg/h via INTRAVENOUS
  Filled 2023-08-05 (×2): qty 100

## 2023-08-05 MED ORDER — ASPIRIN 325 MG PO TABS
325.0000 mg | ORAL_TABLET | Freq: Once | ORAL | Status: AC
  Administered 2023-08-05: 325 mg via ORAL
  Filled 2023-08-05: qty 1

## 2023-08-05 MED ORDER — HALOPERIDOL LACTATE 5 MG/ML IJ SOLN: 2.5000 mg | INTRAMUSCULAR | Status: DC | PRN

## 2023-08-05 MED ORDER — ACETAMINOPHEN 650 MG RE SUPP
650.0000 mg | Freq: Four times a day (QID) | RECTAL | Status: DC | PRN
Start: 2023-08-05 — End: 2023-08-05

## 2023-08-05 MED ORDER — FUROSEMIDE 10 MG/ML IJ SOLN
40.0000 mg | Freq: Once | INTRAMUSCULAR | Status: AC
  Administered 2023-08-05: 40 mg via INTRAVENOUS
  Filled 2023-08-05: qty 4

## 2023-08-05 MED ORDER — MORPHINE SULFATE (PF) 2 MG/ML IV SOLN
2.0000 mg | INTRAVENOUS | Status: DC | PRN
  Administered 2023-08-05: 2 mg via INTRAVENOUS
  Filled 2023-08-05: qty 1

## 2023-08-05 MED ORDER — GLYCOPYRROLATE 0.2 MG/ML IJ SOLN: 0.2000 mg | INTRAMUSCULAR | Status: DC | PRN

## 2023-08-05 MED ORDER — ALPRAZOLAM 0.5 MG PO TABS: 0.5000 mg | ORAL_TABLET | Freq: Three times a day (TID) | ORAL | Status: DC | PRN

## 2023-08-05 MED ORDER — MORPHINE SULFATE (PF) 4 MG/ML IV SOLN
4.0000 mg | INTRAVENOUS | Status: DC | PRN
  Administered 2023-08-05: 2 mg via INTRAVENOUS
  Filled 2023-08-05: qty 1

## 2023-08-05 MED ORDER — MIDAZOLAM HCL 2 MG/2ML IJ SOLN
2.0000 mg | INTRAMUSCULAR | Status: DC | PRN
  Administered 2023-08-05: 1 mg via INTRAVENOUS
  Administered 2023-08-05: 2 mg via INTRAVENOUS
  Filled 2023-08-05 (×2): qty 2

## 2023-08-05 MED ORDER — METHYLPREDNISOLONE SODIUM SUCC 40 MG IJ SOLR
40.0000 mg | Freq: Once | INTRAMUSCULAR | Status: AC
  Administered 2023-08-05: 40 mg via INTRAVENOUS
  Filled 2023-08-05: qty 1

## 2023-08-05 MED ORDER — MIDAZOLAM HCL 2 MG/2ML IJ SOLN: 2.0000 mg | INTRAMUSCULAR | Status: DC | PRN

## 2023-08-05 MED ORDER — HEPARIN (PORCINE) 25000 UT/250ML-% IV SOLN
750.0000 [IU]/h | INTRAVENOUS | Status: DC
  Administered 2023-08-05: 750 [IU]/h via INTRAVENOUS
  Filled 2023-08-05: qty 250

## 2023-08-05 MED ORDER — GLYCOPYRROLATE 1 MG PO TABS: 1.0000 mg | ORAL_TABLET | ORAL | Status: DC | PRN

## 2023-08-05 MED ORDER — MORPHINE BOLUS VIA INFUSION: 5.0000 mg | INTRAVENOUS | Status: DC | PRN

## 2023-08-05 MED ORDER — POLYVINYL ALCOHOL 1.4 % OP SOLN: 1.0000 [drp] | Freq: Four times a day (QID) | OPHTHALMIC | Status: DC | PRN

## 2023-08-05 MED ORDER — HEPARIN BOLUS VIA INFUSION
3500.0000 [IU] | Freq: Once | INTRAVENOUS | Status: AC
  Administered 2023-08-05: 3500 [IU] via INTRAVENOUS
  Filled 2023-08-05: qty 3500

## 2023-08-05 MED ORDER — ACETAMINOPHEN 325 MG PO TABS: 650.0000 mg | ORAL_TABLET | Freq: Four times a day (QID) | ORAL | Status: DC | PRN

## 2023-08-05 MED ORDER — SODIUM CHLORIDE 0.9 % IV SOLN: INTRAVENOUS | Status: DC

## 2023-08-05 MED ORDER — MIDAZOLAM HCL 2 MG/2ML IJ SOLN
0.5000 mg | Freq: Once | INTRAMUSCULAR | Status: AC
  Administered 2023-08-05: 0.5 mg via INTRAVENOUS
  Filled 2023-08-05: qty 2

## 2023-08-05 NOTE — Progress Notes (Signed)
   08/05/23 0815  BiPAP/CPAP/SIPAP  BiPAP/CPAP/SIPAP (S)  V60 (DECREASED TO 5/5, PT PULLING VTS > 1100. ENCOURAGED PT TO SLOW HER BREATHING DOWN, RN  NOTIFIED.)  Mask Type Full face mask  Set Rate 14 breaths/min  FiO2 (%) 45 %  BiPAP/CPAP /SiPAP Vitals  Pulse Rate (!) 107  Resp (!) 25  BP (!) 112/43  SpO2 95 %  MEWS Score/Color  MEWS Score 2  MEWS Score Color Yellow

## 2023-08-05 NOTE — Progress Notes (Signed)
   08/05/23 0749  BiPAP/CPAP/SIPAP  $ Non-Invasive Ventilator  Non-Invasive Vent Set Up;Non-Invasive Vent Initial  $ Face Mask Small Yes  BiPAP/CPAP/SIPAP Pt Type Adult  BiPAP/CPAP/SIPAP (S)  V60 (BIPAP 8/5, RR 14, 45%.)  Mask Type Full face mask  Set Rate 14 breaths/min  Respiratory Rate 22 breaths/min  FiO2 (%) 45 %  Flow Rate 1 lpm (Rise 3)  Patient Home Machine No  Patient Home Mask No  Patient Home Tubing No  Auto Titrate No  Press High Alarm 35 cmH2O  Press Low Alarm 5 cmH2O  Nasal massage performed No (comment)  CPAP/SIPAP surface wiped down Yes  Device Plugged into RED Power Outlet Yes  Oxygen Percent 45 %  BiPAP/CPAP /SiPAP Vitals  Pulse Rate (!) 107  Resp (!) 22  SpO2 92 %  Bilateral Breath Sounds Diminished;Coarse crackles  MEWS Score/Color  MEWS Score 2  MEWS Score Color Yellow

## 2023-08-05 NOTE — Progress Notes (Signed)
 NAME:  Shelia Chan, MRN:  [id number], DOB:  29-Aug-1955, LOS: 4 ADMISSION DATE:  07/31/2023, CONSULTATION DATE: 08/02/2023 REFERRING MD: Arlice - TRH, CHIEF COMPLAINT: Hypovolemic shock  History of Present Illness:  68 year old woman with a history of extensive stage small cell lung cancer diagnosed 06/2023, urothelial cancer with cystectomy, ilial conduit and ureteral stenting.  Also with COPD/asthma, CKD stage IV, plan for chemotherapy to initiate 07/31/2023.  On arrival for that visit she was noted to be hypotensive, started on IV fluids.  Also with bilateral lower extremity edema.  Her lab work revealed a creatinine of 6.35 (up from 3.36 10 days prior).  She has received maintenance IV fluids, no fluid boluses.  She received 1 unit PRBC on 7/16 for hemoglobin 7.8.  Abx have been deferred.  SBP has remained in the high 80s.  She is on room air.  She moved now to stepdown/ICU for further care.  Pertinent Medical History:   Past Medical History:  Diagnosis Date   Arthritis    Asthma    Blood transfusion without reported diagnosis    Cancer (HCC)    CHF (congestive heart failure) (HCC)    COPD (chronic obstructive pulmonary disease) (HCC)    Heart murmur    Immune deficiency disorder (HCC)    Renal disorder    Significant Hospital Events: Including procedures, antibiotic start and stop dates in addition to other pertinent events   7/14 CT Abdomen > hepatomegaly with metastatic disease throughout the liver, no hypermetabolic retroperitoneal and porta hepatis lymph nodes, mild right and prominent left renal atrophy with ureteral stents bilaterally in the proximal ureters, bibasilar cylindrical bronchiectasis  Interim History / Subjective:  Worsening overnight On BiPAP  Objective:   Blood pressure (!) 171/116, pulse (!) 108, temperature (!) 97.4 F (36.3 C), temperature source Axillary, resp. rate (!) 26, height 5' 1 (1.549 m), weight 64.5 kg, SpO2 (!) 88%.    FiO2 (%):  [44 %]  44 %   Intake/Output Summary (Last 24 hours) at 08/05/2023 0727 Last data filed at 08/05/2023 9281 Gross per 24 hour  Intake 1525.01 ml  Output 175 ml  Net 1350.01 ml   Filed Weights   08/01/23 1054 08/02/23 1900  Weight: 58.6 kg 64.5 kg   Physical Examination: General: Chronically ill-appearing HEENT: Dry mucous membranes, BiPAP mask in place Neuro: Awake, uncomfortable  CV: Rhonchi S1-S2 appreciated PULM: On BiPAP at present GI: Soft, bowel sounds appreciated Extremities: Lower extremity edema Skin: Warm/dry, no rashes. Mild erythema of BLE.  I reviewed last 24 h vitals and pain scores, last 48 h intake and output, last 24 h labs and trends, and last 24 h imaging results. Elevated troponin   Resolved Problem List:   Assessment and Plan:   Undifferentiated shock, sepsis -Maintain MAP greater than 65 -Continue pressors -Follow cultures -Continue broad-spectrum antibiotics  Acute kidney injury on chronic kidney disease stage IV Oliguria -Avoid nephrotoxic medications -Ensure adequate renal perfusion  Small cell lung cancer-extensive stage stage -Treatment on hold because of clinical worsening  History of urothelial cell cancer post cystectomy, ileal conduit and ureteral stenting -Continue antibiotics  Chronic obstructive pulmonary disease/asthma - Continue bronchodilators - Pulmonary hygiene  Acute myocardial infarction - Not a candidate for invasive intervention - Anticoagulation, aspirin  - Not able to use beta-blockers as she is on pressors  Generalized abdominal pain  Did call and update  patient's daughter about clinical worsening   Best Practice: (right click and Reselect all SmartList Selections daily)  Diet/type: full liquids  DVT prophylaxis prophylactic heparin   Pressure ulcer(s): N/A GI prophylaxis: N/A Lines: Central line Foley:  Yes, and it is still needed >> port Code Status:  DNR  Last date of multidisciplinary goals of care  discussion [pending]  The patient is critically ill with multiple organ systems failure and requires high complexity decision making for assessment and support, frequent evaluation and titration of therapies, application of advanced monitoring technologies and extensive interpretation of multiple databases. Critical Care Time devoted to patient care services described in this note independent of APP/resident time (if applicable)  is 40 minutes.   Jennet Epley MD [facility] Personal pager: See Amion If unanswered, please page CCM On-call: #[phone]

## 2023-08-05 NOTE — Progress Notes (Signed)
 Patient currently on HFNC. Refused BiPap.  O2 sat remains above 90% but work of breathing is labored.  Will continue to monitor.

## 2023-08-05 NOTE — Progress Notes (Signed)
 PHARMACY - ANTICOAGULATION CONSULT NOTE  Pharmacy Consult for heparin  Indication: chest pain/ACS  Allergies  Allergen Reactions   Metoprolol  Shortness Of Breath   Shellfish-Derived Products Other (See Comments)    MD said to NOT eat this due to the urostomy   Tape Other (See Comments)    CLEAR, PLASTIC, HOSPITAL TAPE PULLS OFF THE SKIN!!!    Patient Measurements: Height: 5' 1 (154.9 cm) Weight: 64.5 kg (142 lb 3.2 oz) IBW/kg (Calculated) : 47.8 HEPARIN  DW (KG): 61.2  Vital Signs: Temp: 97.6 F (36.4 C) (07/19 0800) Temp Source: Oral (07/19 0800) BP: 124/47 (07/19 1115) Pulse Rate: 90 (07/19 1115)  Labs: Recent Labs    08/03/23 0625 08/03/23 1618 08/04/23 0616 08/05/23 0557 08/05/23 0753 08/05/23 1020  HGB 9.8*  --  9.7* 9.3*  --   --   HCT 28.9*  --  28.9* 28.6*  --   --   PLT 338  --  304 297  --   --   CREATININE 5.99* 5.09* 5.83* 6.47*  --   --   TROPONINIHS  --   --   --   --  769* 3,310*    Estimated Creatinine Clearance: 7.1 mL/min (A) (by C-G formula based on SCr of 6.47 mg/dL (H)).   Medical History: Past Medical History:  Diagnosis Date   Arthritis    Asthma    Blood transfusion without reported diagnosis    Cancer (HCC)    CHF (congestive heart failure) (HCC)    COPD (chronic obstructive pulmonary disease) (HCC)    Heart murmur    Immune deficiency disorder (HCC)    Renal disorder     Medications: No anticoagulants PTA -Heparin  subQ for DVT ppx while inpatient  Assessment: Pt is a 12 yoF with PMH significant for lung cancer. Pharmacy consulted to dose heparin  for ACS.  Today, 08/05/23 CBC: Hgb low but stable, Plt WNL AKI, CKD baseline SCr 3  Goal of Therapy:  Heparin  level 0.3-0.7 units/ml Monitor platelets by anticoagulation protocol: Yes   Plan:  Heparin  bolus of 3500 units IV once Heparin  infusion of 750 units/hr Check 8 hour heparin  level CBC, heparin  level daily Monitor for signs of bleeding  Ronal CHRISTELLA Rav,  PharmD 08/05/2023,11:43 AM

## 2023-08-05 NOTE — Progress Notes (Signed)
 Remains anxious  Dose of morphine  increased to 4 mg Q2, Versed  2 mg Q2  Focus on patient's comfort  Use BiPAP if needed for increased work of breathing

## 2023-08-05 NOTE — Progress Notes (Signed)
 Remains anxious with difficulty breathing  Did discuss with daughter further  Will transition to comfort measures Will allow for time for family to visit then transition to full comfort measures

## 2023-08-05 NOTE — Progress Notes (Signed)
 [facility] Progress Note  Name: Shelia Chan MRN: [id number] DOB: 1955/10/28  Chief Complaint:  Sent from clinic with low BP  Subjective:  She had 200 mL UOP over 7/18.  She has been in the ICU.  She has been on 30 mcg/min of levo.  Her daughter is at bedside and has confirmed that she does not want to pursue dialysis.  She states that she does feel like her mother is starting to suffer.  Marked elevation of troponin in the interim.  Team has put in morphine  and she is willing to take some now to see if this helps her to feel better.  I have let her know that I do recommend a transition to comfort focused measures from my standpoint.  Spoke with primary team   Review of systems:     Limited secondary to some confusion  She reports shortness of breath Hx nausea  Anxiety  ------------ Background on consult:  Shelia Chan is a 68 y.o. female with a history of chronic diastolic CHF, COPD, CKD, neuroendocrine carcinoma of the lung, as well as urethral carcinoma status post cystectomy who presented to the hospital from the cancer center due to hypotension.  Her chemotherapy was canceled in response to the hypotension.  She has had nausea with vomiting for the past week.  Creatinine trends are demonstrated below.  Her creatinine has most recently been in the mid to high twos and was 3 on 07/17/2023.  On 7/14 creatinine was noted to be 6.35.  Creatinine is elevated but stable at 6.37 on the date of consult, 7/16.  She has been on fluids with bicarbonate.  This AM was transitioned to bicarb gtt on consult.  She has had 80 mL UOP over 7/15 charted.  CT on 7/14 with bilateral ureteral stents.  Spoke with the patient - she follows with [facility] - Dr. Macel.  She is not sure what she would want to do if her kidneys were to worsen.  Her family is at bedside.  She has been hypotensive today - at one point today was in the 70's systolic.  Improved to the 80's but still  low.  Updated her family at bedside.      Intake/Output Summary (Last 24 hours) at 08/05/2023 1344 Last data filed at 08/05/2023 1312 Gross per 24 hour  Intake 1409.68 ml  Output 170 ml  Net 1239.68 ml    Vitals:  Vitals:   08/05/23 1145 08/05/23 1215 08/05/23 1230 08/05/23 1245  BP: (!) 112/50 110/64 122/62 (!) 93/25  Pulse: 98 93 89 94  Resp: (!) 24 (!) 21 19 (!) 25  Temp:      TempSrc:      SpO2: (!) 73% 97% (!) 89% 92%  Weight:      Height:          Physical Exam:     General: elderly female in bed with increased work of breathing HEENT: NCAT Eyes: EOMI sclera anicteric Neck: supple trachea midline  Heart: S1S2 no rub Lungs: crackles and wet cough; increased work of breathing at rest on high flow nasal cannula  Abdomen: soft/nt/nd;  ileal conduit Extremities: 1-2+ edema lower extremities Skin: no rash on extremities exposed  Neuro: awake and answers questions intermittently, interacts mostly with her daughter  Psych: anxious  GU - no foley - ileal conduit   Medications reviewed    Labs:     Latest Ref Rng & Units 08/05/2023    5:57  AM 08/04/2023    6:16 AM 08/03/2023    4:18 PM  BMP  Glucose 70 - 99 mg/dL 884  842  830   BUN 8 - 23 mg/dL 47  43  42   Creatinine 0.44 - 1.00 mg/dL 3.52  4.16  4.90   Sodium 135 - 145 mmol/L 126  127  131   Potassium 3.5 - 5.1 mmol/L 4.9  4.0  3.8   Chloride 98 - 111 mmol/L 91  93  95   CO2 22 - 32 mmol/L 12  17  17    Calcium  8.9 - 10.3 mg/dL 7.2  7.0  6.8      Assessment/Plan:    # AKI - Secondary to ischemic and prerenal insults in the setting of hypotension as well as nausea/decreased PO intake.  Favor more of an ATN component and this may take time to improve.  Complicated urologic history and s/p cystectomy.  Has bilateral renal stents on CT and on today's renal ultrasound has no hydronephrosis.  Note that the left kidney is atrophic so decreased renal reserve - Her daughter has elected to pursue conservative kidney  management - no dialysis. Should renal function worsen she would want more of a comfort-focused plan  - In light of the above, I have recommend a transition to comfort-focused measures from a renal standpoint   # CKD stage IV  - Baseline Cr 3 - Follows with Dr. Macel at [facility]     # Metabolic acidosis - Secondary to AKI and shock   # Septic shock - Continue midodrine  - Pressors per primary team  - Antibiotics per primary team    # Hyponatremia  - Multifactorial - may be pre-renal    # Hypocalcemia  - improved with supplementation and correction for hypoalbuminemia    # Normocytic Anemia  - Suspect contribution of CKD as well as malignancy - PRBCs per primary team  # Hypophosphatemia  - improved with repletion  Disposition - I recommend a transition to comfort-focused care at this time.  I have spoken with the patient and her family as well as primary team and her nurse.  Appreciate the staff's care of this patient and her family at this difficult time    Shelia [name] Saba, MD 08/05/2023 2:10 PM

## 2023-08-05 NOTE — Progress Notes (Signed)
 Chaplain visited at request of RN. Pt's daughter at bedside. Chaplain provided reflective listening, led a mindfulness exercise to help pt relax and provided prayer at pt's request.  Chaplain Adelina Baptist, [facility] Div  08/05/23 1300  Spiritual Encounters  Type of Visit Initial  Care provided to: Patient;Family  Referral source Nurse (RN/NT/LPN)  Reason for visit Routine spiritual support  Spiritual Framework  Presenting Themes Impactful experiences and emotions  Community/Connection Family;Faith community  Patient Stress Factors Health changes  Interventions  Spiritual Care Interventions Made Compassionate presence;Reflective listening;Meditation;Prayer

## 2023-08-05 NOTE — Plan of Care (Signed)
 Patient transitioned to comfort care.  On morphine  gtts, and versed  push as needed.  Family is at bedside.

## 2023-08-07 ENCOUNTER — Inpatient Hospital Stay

## 2023-08-07 ENCOUNTER — Ambulatory Visit

## 2023-08-08 ENCOUNTER — Inpatient Hospital Stay: Admitting: Internal Medicine

## 2023-08-08 ENCOUNTER — Ambulatory Visit: Admitting: Physician Assistant

## 2023-08-08 ENCOUNTER — Ambulatory Visit

## 2023-08-08 ENCOUNTER — Inpatient Hospital Stay

## 2023-08-08 LAB — CULTURE, BLOOD (ROUTINE X 2)
Culture: NO GROWTH
Culture: NO GROWTH

## 2023-08-09 ENCOUNTER — Inpatient Hospital Stay

## 2023-08-10 ENCOUNTER — Other Ambulatory Visit (HOSPITAL_COMMUNITY)

## 2023-08-10 ENCOUNTER — Ambulatory Visit

## 2023-08-10 ENCOUNTER — Ambulatory Visit (HOSPITAL_COMMUNITY)

## 2023-08-12 ENCOUNTER — Inpatient Hospital Stay

## 2023-08-14 ENCOUNTER — Inpatient Hospital Stay

## 2023-08-16 ENCOUNTER — Other Ambulatory Visit: Payer: Self-pay

## 2023-08-16 NOTE — Progress Notes (Signed)
Disenrolling patient.

## 2023-08-18 NOTE — Progress Notes (Signed)
 Clinically unable to determine whether sepsis was present on admission

## 2023-08-18 NOTE — Death Summary Note (Signed)
 DEATH SUMMARY   Patient Details  Name: Shelia Chan MRN: [id number] DOB: 02-10-1955  Admission/Discharge Information   Admit Date:  08/03/2023  Date of Death: Date of Death: 2023/08/12  Time of Death: Time of Death: 22-Aug-2035  Length of Stay: 5  Referring Physician: Maree Leni Edyth DELENA, MD   Reason(s) for Hospitalization  Patient presented to the hospital with hypotension following being seen at the cancer center, 2 weeks of ongoing nausea and vomiting  Diagnoses  Preliminary cause of death:  Neuroendocrine carcinoma of the lung Secondary Diagnoses (including complications and co-morbidities):  Principal Problem:   AKI (acute kidney injury) (HCC) Active Problems:   CKD (chronic kidney disease), stage IV (HCC)   Chronic diastolic CHF (congestive heart failure) (HCC)   Depression with anxiety   Liver masses   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 year old female who admitted following referral from the oncology clinic, has been complaining of nausea vomiting for almost 2 weeks, noted to be hypotensive, referred to the hospital for further evaluation Noted to be in acute kidney injury.  Started on IV fluids, blood transfusion for anemia.  Despite conservative measures, hypotension worsened and she required transfer to the intensive care unit.  Started on pressors.  Did not have any symptoms suggesting an infectious decompensation.  On bronchodilators for history of COPD/asthma.  DNR status Despite ongoing care, developed worsening shortness of breath, increased cough and congestion, empirically started on antibiotics for  COPD exacerbation and community-acquired pneumonia.  With worsening shortness of breath, was started on BiPAP therapy.  BiPAP therapy unfortunately was not well-tolerated-was discontinued.  EKG changes noted-enzymes requested showing elevation in troponin-started on anticoagulation, aspirin .  Beta-blockers not given as patient was on pressors. Goals of care discussions with family-extensive stage small cell lung cancer, history of urothelial cancer with acute kidney injury on chronic kidney disease, chronic obstructive pulmonary disease exacerbation now complicated by an acute myocardial injury. Decision was made to transition patient to comfort measures  Patient succumbed to her illness 2023-08-12 at 0037 hrs.  Pertinent Labs and Studies  Significant Diagnostic Studies DG CHEST PORT 1 VIEW Result Date: 08/05/2023 CLINICAL DATA:  Shortness of breath EXAM: PORTABLE CHEST 1 VIEW COMPARISON:  X-ray 08/04/2023. FINDINGS: Stable cardiopericardial silhouette with calcified tortuous aorta. Prominent central vasculature. Right IJ chest port in place with tip overlying the upper SVC. Port is accessed. Stable interstitial changes identified particularly right upper lung. No pneumothorax or effusion. No new consolidation. Overlapping cardiac leads. IMPRESSION: No significant interval change when adjusted for technique. Electronically Signed   By: Ranell Bring M.D.   On: 08/05/2023 12:20   DG Abd 1 View Result Date: 08/04/2023 CLINICAL DATA:  Hypoxia. EXAM: ABDOMEN - 1 VIEW COMPARISON:  April 09, 2019. FINDINGS: No abnormal bowel dilatation is noted. Surgical clips are noted in the pelvis. Bilateral ureteral stents are noted passing through probable ileal conduit. IMPRESSION: No abnormal bowel dilatation. Electronically Signed   By: Lynwood Landy Raddle M.D.   On: 08/04/2023 10:06   DG CHEST PORT 1 VIEW Result Date: 08/04/2023 CLINICAL DATA:  Hypoxia. EXAM: PORTABLE CHEST 1 VIEW COMPARISON:  08/03/2023. FINDINGS:  Redemonstration of  mildly elevated right hemidiaphragm. There are persistent mild-to-moderately increased interstitial markings throughout bilateral lungs with upper lobe predominance, without significant interval change. Findings again favor pulmonary edema. No acute consolidation or lung collapse. Bilateral lateral costophrenic angles are clear. No pneumothorax. Stable cardio-mediastinal silhouette. No acute osseous abnormalities. The soft tissues are within normal limits. Redemonstration of right-sided CT Port-A-Cath with its tip overlying the cavoatrial junction region. IMPRESSION: Persistent mild-to-moderate pulmonary edema. No acute consolidation or lung collapse. Electronically Signed   By: Ree Molt M.D.   On: 08/04/2023 10:06   DG Chest Port 1 View Result Date: 08/03/2023 EXAM: 1 VIEW XRAY OF THE CHEST 08/03/2023 08:05:00 AM COMPARISON: 07/17/2023 CLINICAL HISTORY: History of extensive stage small cell lung cancer diagnosed 06/2023, urothelial cancer with cystectomy, ilial conduit and ureteral stenting. Also with COPD/asthma, CKD stage IV, plan for chemotherapy to initiate 07/31/2023. On arrival for that visit she was noted to be hypotensive, started on IV fluids. Also with bilateral lower extremity edema. FINDINGS: LUNGS AND PLEURA: Interstitial prominence is mild and upper lobe predominant favored to represent mild interstitial edema versus less likely atypical infection. No well-defined lobar consolidation. No pleural effusion. No pneumothorax. HEART AND MEDIASTINUM: Mild cardiomegaly. Atherosclerosis in the transverse aorta. BONES AND SOFT TISSUES: Multiple leads and wires project over the chest. The right port-a-cath terminates at the high right atrium. Apical lordotic positioning. Mild right hemidiaphragm elevation. IMPRESSION: 1. Mild interstitial prominence, upper lobe predominant, favored to represent mild interstitial edema versus less likely atypical infection. No well-defined lobar  consolidation. 2. Mild cardiomegaly and atherosclerosis in the transverse aorta. 3. Mild right hemidiaphragm elevation. Electronically signed by: Rockey Kilts MD 08/03/2023 11:58 AM EDT RP Workstation: [id number]   US  RENAL Result Date: 08/02/2023 CLINICAL DATA:  Acute kidney injury. History of cystectomy. Patient has bilateral nephroureteral catheters. EXAM: RENAL / URINARY TRACT ULTRASOUND COMPLETE COMPARISON:  CT abdomen pelvis 07/31/2023 FINDINGS: Right Kidney: Renal measurements: 10.2 x 4.0 x 6.1 cm = volume: 128 mL. Increased echogenicity without hydronephrosis. Simple appearing cyst in the interpolar region that measures 1.9 x 1.5 x 1.5 cm. No suspicious right renal lesion. Left Kidney: Renal measurements: 8.3 x 3.0 x 3.0 cm = volume: 40 mL. Left kidney is small and echogenic. Negative for hydronephrosis. Difficult to visualize the left kidney with ultrasound. Bladder: Appears normal for degree of bladder distention. Other: Liver is enlarged. Liver is heterogeneous with a poorly defined lesion that measures up to 2.7 cm. Patient has known lesions on the recent CT examination. IMPRESSION: 1. No hydronephrosis. 2. Echogenic kidneys compatible with chronic medical renal disease. 3. Left kidney is atrophic. 4. Hepatomegaly with known hepatic lesions. Electronically Signed   By: Juliene Balder M.D.   On: 08/02/2023 17:10   CT ABDOMEN WO CONTRAST Result Date: 07/31/2023 CLINICAL DATA:  Abdominal pain and distension. Small cell lung cancer. 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 * Tracking Code: BO * EXAM: CT ABDOMEN WITHOUT CONTRAST TECHNIQUE: Multidetector CT imaging of the abdomen was performed following the standard protocol without IV contrast. RADIATION DOSE REDUCTION: This exam was performed according to the departmental dose-optimization program which includes automated exposure control, adjustment of the mA and/or kV according to patient size and/or use  of iterative reconstruction technique. COMPARISON:  Multiple exams, including PET-CT 07/24/2023 FINDINGS: Lower chest: Cylindrical bronchiectasis in both lower lobes with mild scarring or atelectasis in the posterior basal segments of both lower lobes. Descending thoracic aortic atherosclerosis. Hepatobiliary: Substantial persistent hepatomegaly with scattered  mostly hypodense lesions throughout all lobes of the liver corresponding to previously identified hypermetabolic hepatic metastases. The right hepatic lobe margin extends down into the anatomic pelvis and beyond the inferior margin of today's noncontrast CT abdomen exam. Pancreas: Indistinct tissue planes around the pancreatic body and head in the vicinity of the known porta hepatis adenopathy. Spleen: Unremarkable Adrenals/Urinary Tract: Mild right and prominent left renal atrophy. Ureteral stents in both collecting systems and extending in the proximal ureters, the exit of the stents via ileal conduit is partially included. Adrenal glands unremarkable. Stomach/Bowel: Unremarkable where included. Vascular/Lymphatic: Atherosclerosis is present, including aortoiliac atherosclerotic disease. Known hypermetabolic retroperitoneal and porta hepatis lymph nodes. Other: Mild diffuse regional subcutaneous edema. Musculoskeletal: Rib deformities compatible with old left lower rib fractures. The patient has known osseous metastatic disease particularly in the lumbar spine which is not readily apparent on the CT data of today's exam but which was visible on the PET-CT from 1 week ago. Chronic deformity of the sacral ala related to old fractures. IMPRESSION: 1. Substantial persistent hepatomegaly with scattered mostly hypodense lesions throughout all lobes of the liver corresponding to previously identified hypermetabolic hepatic metastases. The right hepatic lobe margin extends down into the anatomic pelvis and beyond the inferior margin of today's noncontrast CT abdomen  exam. 2. Known hypermetabolic retroperitoneal and porta hepatis lymph nodes. 3. Mild right and prominent left renal atrophy. Ureteral stents in both collecting systems and extending in the proximal ureters, the exit of the stents via ileal conduit is partially included. 4. The patient has known osseous metastatic disease particularly in the lumbar spine which is not readily apparent on the CT data of today's exam but which was visible on the PET-CT from 1 week ago. 5. Cylindrical bronchiectasis in both lower lobes with mild scarring or atelectasis in the posterior basal segments of both lower lobes. 6. Mild diffuse regional subcutaneous edema. 7.  Aortic Atherosclerosis (ICD10-I70.0). Electronically Signed   By: Ryan Salvage M.D.   On: 07/31/2023 15:50   IR IMAGING GUIDED PORT INSERTION Result Date: 07/28/2023 CLINICAL DATA:  Metastatic neuroendocrine carcinoma and need for porta cath for chemotherapy. EXAM: IMPLANTED PORT A CATH PLACEMENT WITH ULTRASOUND AND FLUOROSCOPIC GUIDANCE ANESTHESIA/SEDATION: Moderate (conscious) sedation was employed during this procedure. A total of Versed  2.0 mg and Fentanyl  100 mcg was administered intravenously. Moderate Sedation Time: 29 minutes. The patient's level of consciousness and vital signs were monitored continuously by radiology nursing throughout the procedure under my direct supervision. FLUOROSCOPY: Radiation Exposure Index: 1.0 mGy Kerma PROCEDURE: The procedure, risks, benefits, and alternatives were explained to the patient. Questions regarding the procedure were encouraged and answered. The patient understands and consents to the procedure. A time-out was performed prior to initiating the procedure. Ultrasound was utilized to confirm patency of the right internal jugular vein. An ultrasound image was saved and recorded. The right neck and chest were prepped with chlorhexidine  in a sterile fashion, and a sterile drape was applied covering the operative field.  Maximum barrier sterile technique with sterile gowns and gloves were used for the procedure. Local anesthesia was provided with 1% lidocaine . After creating a small venotomy incision, a 21 gauge needle was advanced into the right internal jugular vein under direct, real-time ultrasound guidance. Ultrasound image documentation was performed. After securing guidewire access, an 8 Fr dilator was placed. A J-wire was kinked to measure appropriate catheter length. A subcutaneous port pocket was then created along the upper chest wall utilizing sharp and blunt dissection. Portable cautery was  utilized. The pocket was irrigated with sterile saline. A single lumen power injectable port was chosen for placement. The 8 Fr catheter was tunneled from the port pocket site to the venotomy incision. The port was placed in the pocket. External catheter was trimmed to appropriate length based on guidewire measurement. At the venotomy, an 8 Fr peel-away sheath was placed over a guidewire. The catheter was then placed through the sheath and the sheath removed. Final catheter positioning was confirmed and documented with a fluoroscopic spot image. The port was accessed with a needle and aspirated and flushed with heparinized saline. The access needle was removed. The venotomy and port pocket incisions were closed with subcutaneous 3-0 Monocryl and subcuticular 4-0 Vicryl. Dermabond was applied to both incisions. COMPLICATIONS: COMPLICATIONS None FINDINGS: After catheter placement, the tip lies at the cavo-atrial junction. The catheter aspirates normally and is ready for immediate use. IMPRESSION: Placement of single lumen port a cath via right internal jugular vein. The catheter tip lies at the cavo-atrial junction. A power injectable port a cath was placed and is ready for immediate use. Electronically Signed   By: Marcey Moan M.D.   On: 07/28/2023 14:50   NM PET Image Initial (PI) Skull Base To Thigh Result Date:  07/25/2023 CLINICAL DATA:  Subsequent treatment strategy for small cell lung cancer. 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 EXAM: NUCLEAR MEDICINE PET SKULL BASE TO THIGH TECHNIQUE: 6.3 mCi F-18 FDG was injected intravenously. Full-ring PET imaging was performed from the skull base to thigh after the radiotracer. CT data was obtained and used for attenuation correction and anatomic localization. Fasting blood glucose: 128 mg/dl COMPARISON:  Multiple exams, including CT examinations from 07/17/2023 and 07/05/2023 FINDINGS: Mediastinal blood pool activity: SUV max 2.0 Liver activity: SUV max NA NECK: No significant abnormal hypermetabolic activity in this region. Incidental CT findings: None. CHEST: The 1.5 cm medial nodule in the left upper lobe adjacent to the aortic arch has a maximum SUV of 6.7, compatible with malignancy. Subsolid nodule along a bulla anteriorly in the left upper lobe on image 52 series 6 measures 1.3 by 1.0 cm and has a maximum SUV of 1.4, nonspecific but low-grade adenocarcinoma is not excluded. 1.0 by 0.7 cm anterior subpleural left upper lobe nodule on image 42 series 6 with maximum SUV 3.9, compatible with malignancy. Substantial bilateral supraclavicular, prevascular, bilateral paratracheal, AP window, subcarinal, and left hilar hypermetabolic adenopathy observed. Conglomerate AP window adenopathy about 2.6 cm in short axis on image 43 series 6 with maximum SUV 6.3. Index subcarinal node 1.7 cm in short axis on image 47 series 6 with maximum SUV 6.2. Incidental CT findings: Atheromatous thoracic aorta.  Emphysema. ABDOMEN/PELVIS: Innumerable hypermetabolic hepatic masses compatible with active malignancy, some demonstrating central hypointensity compatible with central necrosis. A dominant lesion anteriorly in the lateral segment left hepatic lobe measures proximally 7.0 by 3.4 cm with maximum SUV 7.3. Hepatomegaly noted with indistinct adenopathy along  the porta hepatis adjacent to the upper abdominal aorta, index node 1.2 cm in short axis on image 84 series 6 with maximum SUV 6.3. Incidental CT findings: Low-grade subcutaneous activity diffusely in the abdomen/pelvis, likely benign. Renal atrophy with bilateral double-J ureteral stents extending through an ileal conduit. Atherosclerosis is present, including aortoiliac atherosclerotic disease. Pelvic ascites. SKELETON: Multifocal metastatic lesions in the skeleton including the left proximal humerus, left scapula, ribs, bony pelvis, lumbar spine, and thoracic spine. Index lesion anteriorly in the L4 vertebral body with maximum SUV 5.2.  Most of these lesions are poorly apparent on the CT data. Incidental CT findings: Chronic deformities of the pubic bodies bilaterally likely from old fractures. Possible old sacral fractures with degenerative findings along the SI joints. IMPRESSION: 1. Innumerable hypermetabolic hepatic masses compatible with active malignancy. 2. Substantial hypermetabolic adenopathy in the chest and upper abdomen compatible with active malignancy. 3. Multifocal metastatic lesions in the skeleton. 4. Two hypermetabolic nodules in the left upper lobe compatible with malignancy. 5. Subsolid nodule along a bulla anteriorly in the left upper lobe has a maximum SUV of 1.4, nonspecific but low-grade adenocarcinoma is not excluded. 6. Renal atrophy with bilateral double-J ureteral stents extending through an ileal conduit. 7. Pelvic ascites. 8. Aortic Atherosclerosis (ICD10-I70.0) and Emphysema (ICD10-J43.9). Electronically Signed   By: Ryan Salvage M.D.   On: 07/25/2023 10:16   CT ABDOMEN PELVIS WO CONTRAST Result Date: 07/17/2023 CLINICAL DATA:  Liver biopsy within the last 2 weeks. Diagnosis of liver cancer that has metastasized. Abdominal pain. Nausea, vomiting, diarrhea for 48 hours. Melena and hematemesis. EXAM: CT ABDOMEN AND PELVIS WITHOUT CONTRAST TECHNIQUE: Multidetector CT imaging of  the abdomen and pelvis was performed following the standard protocol without IV contrast. RADIATION DOSE REDUCTION: This exam was performed according to the departmental dose-optimization program which includes automated exposure control, adjustment of the mA and/or kV according to patient size and/or use of iterative reconstruction technique. COMPARISON:  CT abdomen pelvis 07/02/2023 FINDINGS: Lower chest: No acute abnormality. Hepatobiliary: Numerous hypoattenuating lesions throughout the liver compatible with metastases. No acute abnormality. Pancreas: Unremarkable. Spleen: Unremarkable. Adrenals/Urinary Tract: Normal adrenal glands. The previous left hydroureteronephrosis has resolved. Bilateral ureteral stents and right lower quadrant ileal conduit with stents in situ. No urinary calculi or hydronephrosis. Cystectomy. Stomach/Bowel: Normal caliber large and small bowel. No bowel wall thickening. Stomach is within normal limits. The appendix is not visualized. Vascular/Lymphatic: No definite lymphadenopathy noting limitations of noncontrast exam. Aortic atherosclerotic calcification. Reproductive: Hysterectomy.  No adnexal mass. Other: No free intraperitoneal fluid or air. Musculoskeletal: No acute fracture or destructive osseous lesion. Fixation hardware in the left proximal femur. Sclerosis about both SI joints and pubic symphysis and fragmentation are unchanged. Osseous metastases seen on prior MRI are not well visualized on CT. IMPRESSION: 1. No acute abnormality in the abdomen or pelvis. 2. Similar hepatic metastases. 3. Cystectomy with right lower quadrant ileal conduit with bilateral ureteral stents in situ. No hydronephrosis. 4. Aortic Atherosclerosis (ICD10-I70.0). Electronically Signed   By: Norman Gatlin M.D.   On: 07/17/2023 21:50   DG Chest Portable 1 View Result Date: 07/17/2023 CLINICAL DATA:  Vomiting with upper abdominal pain and chest pain. EXAM: PORTABLE CHEST 1 VIEW COMPARISON:  April 21, 2021 FINDINGS: The heart size and mediastinal contours are within normal limits. There is mild to moderate severity calcification of the aortic arch. The lungs are hyperinflated. No acute infiltrate, pleural effusion or pneumothorax is identified. The visualized skeletal structures are unremarkable. IMPRESSION: Hyperinflated lungs without active cardiopulmonary disease. Electronically Signed   By: Suzen Dials M.D.   On: 07/17/2023 21:28   MR Brain W Wo Contrast Result Date: 07/15/2023 CLINICAL DATA:  Provided history: High-grade neuroendocrine carcinoma. Headache with neuroendocrine carcinoma. Rule out metastases. EXAM: MRI HEAD WITHOUT AND WITH CONTRAST TECHNIQUE: Multiplanar, multiecho pulse sequences of the brain and surrounding structures were obtained without and with intravenous contrast. CONTRAST:  5mL GADAVIST  GADOBUTROL  1 MMOL/ML IV SOLN COMPARISON:  Head CT 03/07/2019. FINDINGS: Brain: No age-advanced or lobar predominant cerebral atrophy. Multifocal T2  FLAIR hyperintense signal abnormality within the cerebral white matter, nonspecific but compatible with minimal chronic small vessel ischemic disease. 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 No cortical encephalomalacia is identified. There is no acute infarct. No evidence of an intracranial mass. No chronic intracranial blood products. No extra-axial fluid collection. No midline shift. No pathologic intracranial enhancement identified. Vascular: Maintained flow voids within the proximal large arterial vessels. Skull and upper cervical spine: 9 mm T1 hypointense osseous lesion at the base of the dens. Report portable question at the posterior aspect of this lesion (see series 7, image 13 and series 18, image 13). No focal worrisome marrow lesion within the calvarium. Sinuses/Orbits: No mass or acute finding within the imaged orbits. No significant paranasal sinus disease. Other: Cysts within the nasopharynx  just to the left of midline (9 mm) and within the right eustachian tube (18 mm). Impression #3 will be called to the ordering clinician or representative by the Radiologist Assistant, and communication documented in the PACS or [employer]. IMPRESSION: 1.  No evidence of an acute intracranial abnormality. 2. No evidence of intracranial metastatic disease. 3. 9 mm osseous lesion at the base of the dens as described. Although nonspecific, osseous metastatic disease is a consideration. 4. 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 5. Minor chronic small vessel ischemic changes within the cerebral white matter. 6. Cysts within the nasopharynx just to the left of midline (9 mm) and within the right eustachian tube (18 mm). Electronically Signed   By: Rockey Childs D.O.   On: 07/15/2023 14:52    Microbiology Recent Results (from the past 240 hours)  Urine Culture     Status: Abnormal   Collection Time: 08/02/23  2:15 PM   Specimen: Urine, Random  Result Value Ref Range Status   Specimen Description   Final    URINE, RANDOM Performed at [facility], [address], [place], [place] [address]    Special Requests   Final    NONE Reflexed from [id number] Performed at [facility], [address] [place], [place] [address]    Culture MULTIPLE SPECIES PRESENT, SUGGEST RECOLLECTION (A)  Final   Report Status 08/03/2023 FINAL  Final  Culture, blood (Routine X 2) w Reflex to ID Panel     Status: None (Preliminary result)   Collection Time: 08/03/23  8:36 AM   Specimen: BLOOD  Result Value Ref Range Status   Specimen Description   Final    BLOOD SITE NOT SPECIFIED Performed at [facility], [address] [place], [place] [address]    Special Requests   Final    BOTTLES DRAWN AEROBIC AND ANAEROBIC Blood Culture results may not be optimal due to an inadequate volume of blood received in culture bottles Performed at [facility], [address], [place], [place] [address]    Culture   Final    NO GROWTH 4 DAYS Performed at [facility], [address] [place], [place] [address]    Report Status PENDING  Incomplete  Culture, blood (Routine X 2) w Reflex to ID Panel     Status: None (Preliminary result)   Collection Time: 08/03/23  8:41 AM   Specimen: BLOOD  Result Value Ref Range Status   Specimen Description   Final    BLOOD SITE NOT SPECIFIED Performed at [facility], [address] [place], [place] [address]  Special Requests   Final    BOTTLES DRAWN AEROBIC AND ANAEROBIC Blood Culture results may not be optimal due to an inadequate volume of blood received in culture bottles Performed at [facility], [address] [place], [place] [address]    Culture   Final    NO GROWTH 4 DAYS Performed at [facility], [address], [place], [place] [address]    Report Status PENDING  Incomplete    Lab Basic Metabolic Panel: Recent Labs  Lab 08/02/23 2106 08/03/23 0625 08/03/23 1618 08/04/23 0616 08/05/23 0557  NA 131* 126* 131* 127* 126*  K 3.9 3.9 3.8 4.0 4.9  CL 98 93* 95* 93* 91*  CO2 18* 18* 17* 17* 12*  GLUCOSE 148* 280* 169* 157* 115*  BUN 45* 44* 42* 43* 47*  CREATININE 6.20* 5.99* 5.09* 5.83* 6.47*  CALCIUM  7.4* 7.0* 6.8* 7.0* 7.2*  MG  --  1.6*  --  1.8 1.9  PHOS  --  1.1* 2.9 2.8 4.0   Liver Function Tests: Recent Labs  Lab 07/31/23 1152 08/01/23 1333 08/03/23 0625 08/05/23 0557  AST 90* 99* 196* 573*  ALT 9 12 23  64*  ALKPHOS 285* 244* 284* 282*  BILITOT 1.3* 2.1* 2.0* 2.3*  PROT 6.5 5.8* 5.6* 5.7*  ALBUMIN  3.4* 2.7* 2.5* 2.4*   No results for input(s): LIPASE, AMYLASE in the last 168 hours. No results for input(s): AMMONIA in the last 168 hours. CBC: Recent Labs  Lab 07/31/23 1152 08/01/23 1333 08/02/23 0338 08/02/23 2106 08/03/23 0625 08/04/23 0616 08/05/23 0557  WBC 13.3*   < > 9.1 10.9* 13.7* 13.2*  15.0*  NEUTROABS 11.7*  --  7.4  --  10.1*  --   --   HGB 9.7*   < > 7.8* 9.3* 9.8* 9.7* 9.3*  HCT 28.1*   < > 23.3* 27.9* 28.9* 28.9* 28.6*  MCV 88.1   < > 91.7 87.5 87.3 87.6 88.3  PLT 321   < > 230 285 338 304 297   < > = values in this interval not displayed.   Cardiac Enzymes: No results for input(s): CKTOTAL, CKMB, CKMBINDEX, TROPONINI in the last 168 hours. Sepsis Labs: Recent Labs  Lab 08/02/23 2106 08/03/23 0625 08/04/23 0616 08/05/23 0557  WBC 10.9* 13.7* 13.2* 15.0*  LATICACIDVEN 2.3* 2.8*  --   --     Procedures/Operations     Alfredia Desanctis A Nakai Pollio 08/07/2023, 9:36 AM

## 2023-08-18 DEATH — deceased

## 2023-08-22 ENCOUNTER — Ambulatory Visit: Admitting: Internal Medicine

## 2023-08-22 ENCOUNTER — Ambulatory Visit

## 2023-08-22 ENCOUNTER — Other Ambulatory Visit

## 2023-08-22 ENCOUNTER — Other Ambulatory Visit: Payer: Self-pay | Admitting: Cardiovascular Disease

## 2023-08-23 ENCOUNTER — Ambulatory Visit

## 2023-08-24 ENCOUNTER — Ambulatory Visit

## 2023-08-26 ENCOUNTER — Ambulatory Visit

## 2023-08-28 ENCOUNTER — Other Ambulatory Visit

## 2023-08-28 ENCOUNTER — Ambulatory Visit

## 2023-08-28 ENCOUNTER — Ambulatory Visit: Admitting: Nurse Practitioner

## 2023-08-28 ENCOUNTER — Inpatient Hospital Stay

## 2023-08-29 ENCOUNTER — Ambulatory Visit

## 2023-08-29 NOTE — Addendum Note (Signed)
 Encounter addended by: Janice Lynwood BROCKS on: 08/29/2023 10:38 AM  Actions taken: Imaging Exam ended

## 2023-08-30 ENCOUNTER — Ambulatory Visit

## 2023-09-01 ENCOUNTER — Ambulatory Visit

## 2023-09-04 ENCOUNTER — Inpatient Hospital Stay

## 2023-09-11 ENCOUNTER — Ambulatory Visit

## 2023-09-11 ENCOUNTER — Ambulatory Visit: Admitting: Physician Assistant

## 2023-09-11 ENCOUNTER — Other Ambulatory Visit

## 2023-09-12 ENCOUNTER — Ambulatory Visit

## 2023-09-13 ENCOUNTER — Ambulatory Visit

## 2023-09-15 ENCOUNTER — Ambulatory Visit

## 2023-09-19 ENCOUNTER — Ambulatory Visit: Admitting: Physician Assistant

## 2023-09-19 ENCOUNTER — Ambulatory Visit

## 2023-09-19 ENCOUNTER — Other Ambulatory Visit

## 2023-09-19 ENCOUNTER — Inpatient Hospital Stay

## 2023-09-20 ENCOUNTER — Ambulatory Visit

## 2023-09-21 ENCOUNTER — Ambulatory Visit

## 2023-09-23 ENCOUNTER — Ambulatory Visit

## 2023-09-25 ENCOUNTER — Inpatient Hospital Stay
